# Patient Record
Sex: Male | Born: 1945
Health system: Southern US, Community
[De-identification: ages and names within clinical notes are randomized; demographics above are authoritative.]

## PROBLEM LIST (undated history)

## (undated) DIAGNOSIS — M1990 Unspecified osteoarthritis, unspecified site: Secondary | ICD-10-CM

## (undated) DIAGNOSIS — Z7739 Contact with and (suspected) exposure to other war theater: Secondary | ICD-10-CM

## (undated) DIAGNOSIS — J189 Pneumonia, unspecified organism: Secondary | ICD-10-CM

## (undated) DIAGNOSIS — G4733 Obstructive sleep apnea (adult) (pediatric): Secondary | ICD-10-CM

## (undated) DIAGNOSIS — J449 Chronic obstructive pulmonary disease, unspecified: Secondary | ICD-10-CM

## (undated) DIAGNOSIS — E785 Hyperlipidemia, unspecified: Secondary | ICD-10-CM

## (undated) DIAGNOSIS — I10 Essential (primary) hypertension: Secondary | ICD-10-CM

## (undated) DIAGNOSIS — Z9989 Dependence on other enabling machines and devices: Secondary | ICD-10-CM

## (undated) DIAGNOSIS — F4310 Post-traumatic stress disorder, unspecified: Secondary | ICD-10-CM

## (undated) DIAGNOSIS — Z77098 Contact with and (suspected) exposure to other hazardous, chiefly nonmedicinal, chemicals: Secondary | ICD-10-CM

## (undated) HISTORY — DX: Obstructive sleep apnea (adult) (pediatric): G47.33

## (undated) HISTORY — DX: Essential (primary) hypertension: I10

## (undated) HISTORY — DX: Hyperlipidemia, unspecified: E78.5

## (undated) HISTORY — DX: Dependence on other enabling machines and devices: Z99.89

## (undated) HISTORY — PX: OTHER SURGICAL HISTORY: SHX169

## (undated) HISTORY — PX: CARDIAC CATHETERIZATION: SHX172

## (undated) HISTORY — DX: Unspecified osteoarthritis, unspecified site: M19.90

## (undated) HISTORY — DX: Post-traumatic stress disorder, unspecified: F43.10

## (undated) HISTORY — PX: TONSILLECTOMY: SUR1361

---

## 1998-02-11 ENCOUNTER — Encounter: Payer: Self-pay | Admitting: Internal Medicine

## 1998-02-11 ENCOUNTER — Ambulatory Visit (HOSPITAL_COMMUNITY): Admission: RE | Admit: 1998-02-11 | Discharge: 1998-02-11 | Payer: Self-pay | Admitting: Internal Medicine

## 2000-07-08 ENCOUNTER — Emergency Department (HOSPITAL_COMMUNITY): Admission: EM | Admit: 2000-07-08 | Discharge: 2000-07-08 | Payer: Self-pay | Admitting: Emergency Medicine

## 2000-07-12 ENCOUNTER — Encounter: Payer: Self-pay | Admitting: Occupational Medicine

## 2000-07-12 ENCOUNTER — Encounter: Admission: RE | Admit: 2000-07-12 | Discharge: 2000-07-12 | Payer: Self-pay | Admitting: Occupational Medicine

## 2001-09-07 ENCOUNTER — Ambulatory Visit (HOSPITAL_COMMUNITY): Admission: RE | Admit: 2001-09-07 | Discharge: 2001-09-07 | Payer: Self-pay | Admitting: Internal Medicine

## 2001-09-07 ENCOUNTER — Encounter: Payer: Self-pay | Admitting: Internal Medicine

## 2002-07-17 ENCOUNTER — Encounter: Payer: Self-pay | Admitting: Emergency Medicine

## 2002-07-18 ENCOUNTER — Inpatient Hospital Stay (HOSPITAL_COMMUNITY): Admission: EM | Admit: 2002-07-18 | Discharge: 2002-07-19 | Payer: Self-pay | Admitting: Emergency Medicine

## 2003-07-03 ENCOUNTER — Ambulatory Visit (HOSPITAL_COMMUNITY): Admission: RE | Admit: 2003-07-03 | Discharge: 2003-07-03 | Payer: Self-pay | Admitting: Cardiology

## 2003-12-21 ENCOUNTER — Encounter: Admission: RE | Admit: 2003-12-21 | Discharge: 2003-12-21 | Payer: Self-pay | Admitting: Internal Medicine

## 2004-02-18 ENCOUNTER — Ambulatory Visit (HOSPITAL_COMMUNITY): Admission: RE | Admit: 2004-02-18 | Discharge: 2004-02-18 | Payer: Self-pay | Admitting: Internal Medicine

## 2004-02-26 ENCOUNTER — Encounter (INDEPENDENT_AMBULATORY_CARE_PROVIDER_SITE_OTHER): Payer: Self-pay | Admitting: *Deleted

## 2004-02-26 ENCOUNTER — Ambulatory Visit (HOSPITAL_COMMUNITY): Admission: RE | Admit: 2004-02-26 | Discharge: 2004-02-26 | Payer: Self-pay | Admitting: Internal Medicine

## 2004-02-26 ENCOUNTER — Encounter (INDEPENDENT_AMBULATORY_CARE_PROVIDER_SITE_OTHER): Payer: Self-pay | Admitting: Specialist

## 2004-05-16 ENCOUNTER — Ambulatory Visit (HOSPITAL_COMMUNITY): Admission: RE | Admit: 2004-05-16 | Discharge: 2004-05-16 | Payer: Self-pay | Admitting: *Deleted

## 2004-05-16 ENCOUNTER — Encounter (INDEPENDENT_AMBULATORY_CARE_PROVIDER_SITE_OTHER): Payer: Self-pay | Admitting: *Deleted

## 2004-07-22 ENCOUNTER — Encounter (HOSPITAL_COMMUNITY): Admission: RE | Admit: 2004-07-22 | Discharge: 2004-10-20 | Payer: Self-pay | Admitting: Internal Medicine

## 2005-04-10 ENCOUNTER — Encounter: Admission: RE | Admit: 2005-04-10 | Discharge: 2005-04-10 | Payer: Self-pay | Admitting: Internal Medicine

## 2006-04-09 ENCOUNTER — Ambulatory Visit (HOSPITAL_BASED_OUTPATIENT_CLINIC_OR_DEPARTMENT_OTHER): Admission: RE | Admit: 2006-04-09 | Discharge: 2006-04-09 | Payer: Self-pay | Admitting: Urology

## 2006-07-15 ENCOUNTER — Encounter: Admission: RE | Admit: 2006-07-15 | Discharge: 2006-07-15 | Payer: Self-pay | Admitting: Internal Medicine

## 2007-04-28 ENCOUNTER — Ambulatory Visit (HOSPITAL_COMMUNITY): Admission: RE | Admit: 2007-04-28 | Discharge: 2007-04-28 | Payer: Self-pay | Admitting: *Deleted

## 2007-04-28 ENCOUNTER — Encounter (INDEPENDENT_AMBULATORY_CARE_PROVIDER_SITE_OTHER): Payer: Self-pay | Admitting: *Deleted

## 2009-04-17 ENCOUNTER — Emergency Department (HOSPITAL_COMMUNITY): Admission: EM | Admit: 2009-04-17 | Discharge: 2009-04-17 | Payer: Self-pay | Admitting: Emergency Medicine

## 2009-04-24 ENCOUNTER — Emergency Department (HOSPITAL_COMMUNITY): Admission: EM | Admit: 2009-04-24 | Discharge: 2009-04-24 | Payer: Self-pay | Admitting: Emergency Medicine

## 2009-09-02 ENCOUNTER — Encounter (INDEPENDENT_AMBULATORY_CARE_PROVIDER_SITE_OTHER): Payer: Self-pay | Admitting: *Deleted

## 2009-09-13 ENCOUNTER — Ambulatory Visit: Payer: Self-pay | Admitting: Gastroenterology

## 2009-09-19 ENCOUNTER — Ambulatory Visit (HOSPITAL_COMMUNITY): Admission: RE | Admit: 2009-09-19 | Discharge: 2009-09-19 | Payer: Self-pay | Admitting: Endocrinology

## 2010-03-30 ENCOUNTER — Encounter: Payer: Self-pay | Admitting: Internal Medicine

## 2010-04-10 NOTE — Letter (Signed)
Summary: New Patient letter  [facility]  [address] [place], [place] [address]   Phone: [phone]  Fax: [phone]       09/02/2009 MRN: [id number]  Douglas Hansen [address] [place], [place]  [address]  Dear Douglas Hansen,  Welcome to the Gastroenterology Division at [facility].    You are scheduled to see Dr.  Christella Hartigan on 10-08-09 at 2:30p.m. on the 3rd floor at [facility], [address]  We ask that you try to arrive at our office 15 minutes prior to your appointment time to allow for check-in.  We would like you to complete the enclosed self-administered evaluation form prior to your visit and bring it with you on the day of your appointment.  We will review it with you.  Also, please bring a complete list of all your medications or, if you prefer, bring the medication bottles and we will list them.  Please bring your insurance card so that we may make a copy of it.  If your insurance requires a referral to see a specialist, please bring your referral form from your primary care physician.  Co-payments are due at the time of your visit and may be paid by cash, check or credit card.     Your office visit will consist of a consult with your physician (includes a physical exam), any laboratory testing he/she may order, scheduling of any necessary diagnostic testing (e.g. x-ray, ultrasound, CT-scan), and scheduling of a procedure (e.g. Endoscopy, Colonoscopy) if required.  Please allow enough time on your schedule to allow for any/all of these possibilities.    If you cannot keep your appointment, please call [phone] to cancel or reschedule prior to your appointment date.  This allows [place] the opportunity to schedule an appointment for another patient in need of care.  If you do not cancel or reschedule by 5 p.m. the business day prior to your appointment date, you will be charged a $50.00 late cancellation/no-show fee.    Thank you for choosing  [facility] for your medical needs.  We appreciate the opportunity to care for you.  Please visit [place] at our website  to learn more about our practice.                     Sincerely,                                                             The Gastroenterology Division

## 2010-04-10 NOTE — Assessment & Plan Note (Signed)
History of Present Illness Visit Type: Initial Visit Primary Provider: Pearson Grippe, MD Requesting Provider: Dr Dorisann Frames Chief Complaint: Patient c/o odynophagia History of Present Illness:     pleasant 65 year old man;  He has right sided neck feeling of pain.  This can happen when chewing, swallowing.  Sometimes the pain occurs on its own.  It is a burning pain.  Drinking water can help, he will rub the right neck and that can help. This has been going on for 3 months only.  He stays bloated alot.  He has been taking victoza and has noted bowel constipation on those days.  He was on antibiotics this past may/april for a sore throat.    he had EGD, dilation in 2009 and also in 2006 by Dr. Sabino Gasser.  These were for distal esophageal narrowing. He said he used to have a lot of dysphasia sensation but he has had no dysphasia since the second dilation. He is very clear that symptom he is having now is in his right neck and not at all like the dysphasia he was having before.  He is having some tests on his right neck, throat next week at [facility].  He had a colonoscopy by Dr. Greggory Stallion or in 2006 and no polyps were found.           Current Medications (verified): 1)  Lovaza 1 Gm Caps (Omega-3-Acid Ethyl Esters) .... Take 2 Capsules By Mouth Two Times A Day 2)  Azor 10-40 Mg Tabs (Amlodipine-Olmesartan) .... Take 1 Tablet By Mouth Once A Day 3)  Singulair 10 Mg Tabs (Montelukast Sodium) .... Take 1 Tablet By Mouth Once A Day 4)  Onglyza 5 Mg Tabs (Saxagliptin Hcl) .... Take 1 Tablet By Mouth Once A Day 5)  Fexofenadine Hcl 180 Mg Tabs (Fexofenadine Hcl) .... Take 1 Tablet By Mouth Two Times A Day 6)  Proair Hfa 108 (90 Base) Mcg/act Aers (Albuterol Sulfate) .... 8.5 Grams Four Times Daily 7)  Fluticasone (Unknown Dosage) .... 2 Sprays Each Nostril Once Daily 8)  Actoplus Met 15-500 Mg Tabs (Pioglitazone Hcl-Metformin Hcl) .... Take 1 Tablet By Mouth Two Times A Day 9)   Zoloft 100 Mg Tabs (Sertraline Hcl) .... Take 1 Tablet By Mouth Once A Day 10)  Aspirin 81 Mg Tbec (Aspirin) .... Take 1 Tablet By Mouth Once A Day 11)  Omeprazole 20 Mg Cpdr (Omeprazole) .... Take 1 Tablet By Mouth Once A Day 12)  Simvastatin 40 Mg Tabs (Simvastatin) .... Take 1 Tablet By Mouth Once A Day 13)  Vitamin C Cr 1000 Mg Cr-Tabs (Ascorbic Acid) .... Take 1 Tablet By Mouth Once A Day 14)  Vitamin D 1000 Unit Tabs (Cholecalciferol) .... Take 1 Tablet By Mouth Once A Day 15)  Vitamin B-12 250 Mcg Tabs (Cyanocobalamin) .... Take 1 Tablet By Mouth Once A Day 16)  Align  Caps (Probiotic Product) .... Take 1 Capsule By Mouth Once A Day 17)  Travatan Z 0.004 % Soln (Travoprost) .... Use As Needed-1 Drop Each Eye 18)  Clindamycin (Unknown Dosage) Cream .... Use As Directed On Skin 19)  Calipotriene (Unknown Dosage) .... Apply To Skin As Directed 20)  Clotrimazole 1 % Crea (Clotrimazole) .... Apply As Directed To Skin 21)  Ketoconazole (Unknown Dosage)cream .... Apply As Directed To Skin 22)  Dermacerin  Crea (Skin Protectants, Misc.) .... Apply As Directed To Skin 23)  Cellulose Carmellose Sodium  Powd (Cellulose Carmellose Sodium) .... Apply To Feet As  Directed 24)  Clobetasol Cream (Uknown Dosage) .... Use As Directed 25)  Triderm 0.1 % Crea (Triamcinolone Acetonide) .... Apply As Directed To Skin 26)  Derma-Smoothe/fs Body 0.01 % Oil (Fluocinolone Acetonide) .... Apply As Directed 27)  Benzyl Peroxide Liquid (Unknown Dosage) .... Apply As Directed On Feet  Allergies (verified): 1)  ! Codeine  Past History:  Past Medical History: Diabetes Hyperlipidemia Hypertension arthritis  Past Surgical History: none  Family History: no colon cancer  Social History: he is married, he has 5 children, he is retired, he quit smoking, he does not drink alcohol.  Review of Systems       Pertinent positive and negative review of systems were noted in the above HPI and GI specific review of  systems.  All other review of systems was otherwise negative.   Vital Signs:  Patient profile:   65 year old male Height:      67 inches Weight:      216.13 pounds BMI:     33.97 BSA:     2.09 Pulse rate:   96 / minute Pulse rhythm:   regular BP sitting:   130 / 80  (right arm)  Vitals Entered By: Lamona Curl CMA Duncan Dull) (September 13, 2009 2:28 PM)  Physical Exam  Additional Exam:  Constitutional: generally well appearing Psychiatric: alert and oriented times 3 Eyes: extraocular movements intact Mouth: oropharynx moist, no lesions:  no oral thrush, no obvious right oropharynx lesions Neck: supple, no lymphadenopathy Cardiovascular: heart regular rate and rythm Lungs: CTA bilaterally Abdomen: soft, non-tender, non-distended, no obvious ascites, no peritoneal signs, normal bowel sounds Extremities: no lower extremity edema bilaterally Skin: no lesions on visible extremities    Impression & Recommendations:  Problem # 1:  right sided neck discomfort unclear etiology. He seems this might be related to his parathyroid. He has been already set up for testing of his right neck and we will track down those results. This symptom does not seem esophageal related at all. He may need ear nose and throat evaluation if the next films are not revealing.  Problem # 2:  routine risk for colon cancer we will set him up for recall colonoscopy in 2016, 10 years from that date of his previous one.  Patient Instructions: 1)  We will get in touch with Dr. Willeen Cass office about your upcoming right neck testing. This pain does not seem GI related.  You may need ENT evaluation. 2)  Recall colonoscopy for colon cancer screening in March 2016. 3)  A copy of this information will be sent to Dr. Ricki Miller, [place]. 4)  The medication list was reviewed and reconciled.  All changed / newly prescribed medications were explained.  A complete medication list was provided to the patient / caregiver.

## 2010-04-10 NOTE — Procedures (Signed)
Summary: Colon   Colonoscopy  Procedure date:  05/16/2004  Findings:      Location:  [facility].   NAME:  Douglas Hansen, Douglas Hansen                ACCOUNT NO.:  [id number]   MEDICAL RECORD NO.:  [id number]          PATIENT TYPE:  AMB   LOCATION:  ENDO                         FACILITY:  [facility]   PHYSICIAN:  Georgiana Spinner, M.D.    DATE OF BIRTH:  1946/01/11   DATE OF PROCEDURE:  05/16/2004  DATE OF DISCHARGE:                                 OPERATIVE REPORT   PROCEDURE:  Colonoscopy.   INDICATIONS:  Colon cancer screening.   ANESTHESIA:  Demerol 70, Versed 7 mg.   PROCEDURE:  With the patient mildly sedated in the left lateral decubitus  position, a rectal examination was performed which was unremarkable.  Subsequently, the Olympus videoscopic colonoscope was inserted in the rectum  and passed under direct vision to the cecum, identified by ileocecal valve  and appendiceal orifice, both which were photographed.  Of note, there was  prep left, residual tenacious, yellowish material that was difficult to  suction.  But from this point, the colonoscope was then slowly withdrawn  taking circumferential views of the colonic mucosa, stopping to rinse and  suction as we went until we reached the rectum which appeared normal on  direct and showed hemorrhoids on retroflexed view.  The endoscope was  straightened and withdrawn.  The patient's vital signs, pulse oximeter  remained stable.  The patient tolerated procedure well without apparent  complication.   FINDINGS:  Internal hemorrhoids otherwise an unremarkable examination to the  cecum.   PLAN:  Consider repeat examination in 5 years.      GMO/MEDQ  D:  05/16/2004  T:  05/16/2004  Job:  [id number]

## 2010-04-10 NOTE — Procedures (Signed)
Summary: EGD   EGD  Procedure date:  04/28/2007  Findings:      Location: [facility]   NAME:  Douglas Hansen, Douglas Hansen                ACCOUNT NO.:  [id number]      MEDICAL RECORD NO.:  [id number]          PATIENT TYPE:  AMB      LOCATION:  ENDO                         FACILITY:  [facility]      PHYSICIAN:  Georgiana Spinner, M.D.    DATE OF BIRTH:  01/05/1946      DATE OF PROCEDURE:  04/28/2007   DATE OF DISCHARGE:                                  OPERATIVE REPORT      PROCEDURE:  Upper endoscopy with dilation.      ENDOSCOPIST:  Georgiana Spinner, M.D.      INDICATIONS:  Dysphagia.      ANESTHESIA:  Fentanyl 50 mcg, Versed 6 mg.      PROCEDURE:  With the patient mildly sedated in the left lateral   decubitus position, the Pentax videoscopic endoscope was inserted in the   mouth and passed under direct vision through the esophagus into the   stomach; fundus, body, antrum, duodenal bulb and second portion of   duodenum were visualized.  From this point, the endoscope was slowly   withdrawn, taking circumferential views of duodenal mucosa until the   endoscope had been pulled back into stomach and placed in retroflexion   to view the stomach from below.  The endoscope was straightened and a   guidewire was passed.  The endoscope was withdrawn.  Subsequently, a 17   Savary dilator was passed, but I met resistance and therefore I elected   to withdraw the dilator and the guidewire as well and then subsequently   the endoscope was then reinserted again, passed under direct vision   through the esophagus into the stomach.  The guidewire was once again   passed.  The endoscope was withdrawn.  This time, the Savary dilator was   passed over the guidewire with minimal resistance.  With the dilator, I   removed the guidewire; there was no blood seen.  The endoscope was   reinserted through the esophagus back into the stomach.  The endoscope   was then withdrawn, taking circumferential views of  the remaining   gastric and esophageal mucosa.  The patient's vital signs and pulse   oximetry remained stable.  The patient tolerated the procedure well   without apparent complications.      FINDINGS:  Unremarkable examination with the dilation to 17 Savary.      PLAN:  Await clinical response.  The patient will call me and follow up   with me as needed as an outpatient.                  ______________________________   Georgiana Spinner, M.D.            GMO/MEDQ  D:  04/28/2007  T:  04/29/2007  Job:  [id number]

## 2010-04-10 NOTE — Procedures (Signed)
Summary: EGD   EGD  Procedure date:  02/26/2004  Findings:      Location: [facility]   NAME:  Douglas Hansen, Douglas Hansen                ACCOUNT NO.:  [id number]   MEDICAL RECORD NO.:  [id number]          PATIENT TYPE:  AMB   LOCATION:  ENDO                         FACILITY:  [facility]   PHYSICIAN:  Georgiana Spinner, M.D.    DATE OF BIRTH:  1945/08/20   DATE OF PROCEDURE:  02/26/2004  DATE OF DISCHARGE:                                 OPERATIVE REPORT   PROCEDURE:  Upper endoscopy with Savary dilation and biopsy.   ENDOSCOPIST:  Georgiana Spinner, M.D.   INDICATIONS:  Dysphagia with known esophageal stricture on a barium swallow.   ANESTHESIA:  Demerol 60 mg, Versed 8 mg.   PROCEDURE:  With the patient mildly sedated in the left lateral decubitus  position in room 1 of endoscopy, the Olympus videoscopic endoscope was  inserted in the mouth and passed under direct vision through the esophagus,  which appeared normal.  The gastroesophageal junction appeared to be mildly  inflamed; this was photographed.  We then entered into the stomach; fundus,  body, antrum, duodenal bulb and second portion of duodenum were visualized.  From this point, the endoscope was slowly withdrawn, taking circumferential  views of the duodenal mucosa until the endoscope had been pulled back into  the stomach and placed in retroflexion to view the stomach from below.  The  endoscope was then straightened and a guidewire was passed under  fluoroscopic guidance.  Then endoscope was withdrawn, taking circumferential  views of the remaining gastric and esophageal mucosa as we withdrew,  photograph taken.  The endoscope was then withdrawn and over the guidewire  were passed Savary dilators 15 and 17 using fluoroscopic guidance.  With the  17, there was some resistance, therefore I elected to not dilate any  further, despite the fact there was no blood noted on either dilator.  The  endoscope was then subsequently  reinserted and passed into the stomach,  where we pulled back into the esophagus and biopsied the gastroesophageal  junction.  The endoscope was then withdrawn.  The patient's vital signs and  pulse oximetry remained stable.  The patient tolerated the procedure well  without apparent complications.   FINDINGS:  Mild inflammatory changes of the distal esophagus, biopsied and  dilated to 15 and 17 Savary dilation.   PLAN:  We will have the patient on a clear liquid diet for today and resume  regular diet in the morning, and have the patient follow up with me as an  outpatient.       GMO/MEDQ  D:  02/26/2004  T:  02/27/2004  Job:  [id number]

## 2010-04-25 ENCOUNTER — Ambulatory Visit: Payer: Medicare Other | Attending: Internal Medicine | Admitting: Physical Therapy

## 2010-04-25 DIAGNOSIS — R42 Dizziness and giddiness: Secondary | ICD-10-CM | POA: Insufficient documentation

## 2010-04-25 DIAGNOSIS — IMO0001 Reserved for inherently not codable concepts without codable children: Secondary | ICD-10-CM | POA: Insufficient documentation

## 2010-06-05 ENCOUNTER — Encounter: Payer: Self-pay | Admitting: Internal Medicine

## 2010-06-09 ENCOUNTER — Encounter: Payer: Self-pay | Admitting: Internal Medicine

## 2010-06-09 ENCOUNTER — Ambulatory Visit (INDEPENDENT_AMBULATORY_CARE_PROVIDER_SITE_OTHER)
Admission: RE | Admit: 2010-06-09 | Discharge: 2010-06-09 | Disposition: A | Discharge status: Home / Self Care | Payer: Medicare Other | Source: Ambulatory Visit | Attending: Internal Medicine | Admitting: Internal Medicine

## 2010-06-09 ENCOUNTER — Ambulatory Visit (INDEPENDENT_AMBULATORY_CARE_PROVIDER_SITE_OTHER): Payer: Medicare Other | Admitting: Internal Medicine

## 2010-06-09 DIAGNOSIS — R0602 Shortness of breath: Secondary | ICD-10-CM | POA: Insufficient documentation

## 2010-06-09 DIAGNOSIS — J45909 Unspecified asthma, uncomplicated: Secondary | ICD-10-CM

## 2010-06-09 DIAGNOSIS — E119 Type 2 diabetes mellitus without complications: Secondary | ICD-10-CM | POA: Insufficient documentation

## 2010-06-09 DIAGNOSIS — J449 Chronic obstructive pulmonary disease, unspecified: Secondary | ICD-10-CM | POA: Insufficient documentation

## 2010-06-09 DIAGNOSIS — I10 Essential (primary) hypertension: Secondary | ICD-10-CM

## 2010-06-09 DIAGNOSIS — E785 Hyperlipidemia, unspecified: Secondary | ICD-10-CM | POA: Insufficient documentation

## 2010-06-09 DIAGNOSIS — Z9989 Dependence on other enabling machines and devices: Secondary | ICD-10-CM

## 2010-06-09 DIAGNOSIS — R059 Cough, unspecified: Secondary | ICD-10-CM

## 2010-06-09 DIAGNOSIS — R05 Cough: Secondary | ICD-10-CM

## 2010-06-09 DIAGNOSIS — G4733 Obstructive sleep apnea (adult) (pediatric): Secondary | ICD-10-CM | POA: Insufficient documentation

## 2010-06-09 NOTE — Patient Instructions (Signed)
Work on inhaler technique:  relax and gently blow all the way out then take a nice smooth deep breath back in, triggering the inhaler at same time you start breathing in.  Hold for up to 5 seconds if you can.  Rinse and gargle with water when done   If your mouth or throat starts to bother you,   I suggest you time the inhaler to your dental care and after using the inhaler(s) brush teeth and tongue with a baking soda containing toothpaste and when you rinse this out, gargle with it first to see if this helps your mouth and throat.     GERD (REFLUX)  is an extremely common cause of respiratory symptoms, many times with no significant heartburn at all.    It can be treated with medication, but also with lifestyle changes including avoidance of late meals, excessive alcohol, smoking cessation, and avoid fatty foods, chocolate, peppermint, colas, red wine, and acidic juices such as orange juice.  NO MINT OR MENTHOL PRODUCTS SO NO COUGH DROPS  USE SUGARLESS CANDY INSTEAD (jolley ranchers or Stover's)  NO OIL BASED VITAMINS  (STOP FISH OIL FOR NOW)  Take omeprazole Take 30-60 min before first meal of the day

## 2010-06-09 NOTE — Assessment & Plan Note (Signed)
The differential diagnosis of difficult to control airways disorders is extensive with no quick and easy answers but easy to remember because it consists of 11 A's,  Two Bs and one C: 1. Adherence, always a challenge and the leading suspect.  The proper method of use, as well as anticipated side effects, of this metered-dose inhaler are discussed and demonstrated to the patient.  Improved from 50-75% with repeated coaching  2. Acid reflux disease, with the greater proportion of pulmonary patients with no overt heartburn symptoms, and no easy way to treat non-acid reflux so should avoid fish oil 3. Ace inhibitor use  >not relevant here  4. Active sinus dz, best addressed by a sinus ct 5. Active smoking,  Usually sureptitious in this setting 6. Allergic diseases > per Bardelas 7. Aspiration, a perennial problem in the elderly or other patients at risk 8. Allergic Bronchopulmonary Aspergillosis, associated with IgE's in the thousands 9. Alpha one Antitrypsin deficiency, a must screen in patients with chronic airflow obstruction syndromes out of proportion to smoking history. 10. Adverse effect of inhalers, especially DPI's and especially with poor inhaler technique 11 Anxiety, always a diagnosis of exclusion Two B's 1. Bronchiectasis:  Pos CT is the sine que non here 2  Beta blocker effects:  Coreg and Timolol use are pervasive in the adult population and both have significant spillover effects on the airways One C 1. Congestive heart failure, nicely ruled out now with BNP level of < 100 when symptomatic

## 2010-06-09 NOTE — Progress Notes (Signed)
  Subjective:    Patient ID: Douglas Hansen, male    DOB: 05-Feb-1946, 65 y.o.   MRN: [id number]  HPI 56 yobm  Quit smoking in  1987 with wt 165 with doe with running  then gradually gained wt and worsening doe so referred to Clinic by Dr Beaulah Dinning some  Better on symbicort  06/09/2010 ov Initial pulmonary office eval for progressive doe to point where sob > aisle at [employer] or climbing a flight of steps with laundry.   Rides bike x 15 minutes then legs get tired. Also cough, mostly dry.  Pt denies any significant sore throat, dysphagia, itching, sneezing,  nasal congestion or excess/ purulent secretions,  fever, chills, sweats, unintended wt loss, pleuritic or exertional cp, hempoptysis, orthopnea pnd or leg swelling.    Also denies any obvious fluctuation of symptoms with weather or environmental changes or other aggravating or alleviating factors.     PMHx: COPD/AB     - PFT's 06/03/10   FEV1  1.60  FVC 3liters so ratio 54%     - HFA 50% p coaching 06/09/2010    Review of Systems  Constitutional: Negative for fever, chills, activity change, appetite change and unexpected weight change.  HENT: Positive for congestion and sneezing. Negative for sore throat, rhinorrhea, trouble swallowing, dental problem, voice change and postnasal drip.   Eyes: Negative for visual disturbance.  Respiratory: Positive for cough and shortness of breath. Negative for choking.   Cardiovascular: Negative for chest pain and leg swelling.  Gastrointestinal: Positive for abdominal pain. Negative for nausea and vomiting.  Genitourinary: Negative for difficulty urinating.  Musculoskeletal: Negative for arthralgias.  Skin: Negative for rash.  Neurological: Positive for headaches.  Psychiatric/Behavioral: Negative for behavioral problems and confusion.       Objective:   Physical Exam    pleasant mod obeste amb bm nad HEENT mild turbinate edema.  Oropharynx no thrush or excess pnd or cobblestoning.  No JVD or  cervical adenopathy. Mild accessory muscle hypertrophy. Trachea midline, nl thryroid. Chest was hyperinflated by percussion with diminished breath sounds and moderate increased exp time without wheeze. Hoover sign positive at mid inspiration. Regular rate and rhythm without murmur gallop or rub or increase P2 or edema.  Abd: no hsm, nl excursion. Ext warm without cyanosis or clubbing.      Assessment & Plan:

## 2010-06-11 NOTE — Progress Notes (Signed)
Quick Note:  Spoke with pt and notified of results per MW. Pt verbalized understanding. ______

## 2010-07-22 NOTE — Op Note (Signed)
NAMEDARCY, CORDNER NO.:  [id number]   MEDICAL RECORD NO.:  [id number]          PATIENT TYPE:  AMB   LOCATION:  ENDO                         FACILITY:  [facility]   PHYSICIAN:  Georgiana Spinner, M.D.    DATE OF BIRTH:  06-Oct-1945   DATE OF PROCEDURE:  04/28/2007  DATE OF DISCHARGE:                               OPERATIVE REPORT   PROCEDURE:  Upper endoscopy with dilation.   ENDOSCOPIST:  Georgiana Spinner, M.D.   INDICATIONS:  Dysphagia.   ANESTHESIA:  Fentanyl 50 mcg, Versed 6 mg.   PROCEDURE:  With the patient mildly sedated in the left lateral  decubitus position, the Pentax videoscopic endoscope was inserted in the  mouth and passed under direct vision through the esophagus into the  stomach; fundus, body, antrum, duodenal bulb and second portion of  duodenum were visualized.  From this point, the endoscope was slowly  withdrawn, taking circumferential views of duodenal mucosa until the  endoscope had been pulled back into stomach and placed in retroflexion  to view the stomach from below.  The endoscope was straightened and a  guidewire was passed.  The endoscope was withdrawn.  Subsequently, a 17  Savary dilator was passed, but I met resistance and therefore I elected  to withdraw the dilator and the guidewire as well and then subsequently  the endoscope was then reinserted again, passed under direct vision  through the esophagus into the stomach.  The guidewire was once again  passed.  The endoscope was withdrawn.  This time, the Savary dilator was  passed over the guidewire with minimal resistance.  With the dilator, I  removed the guidewire; there was no blood seen.  The endoscope was  reinserted through the esophagus back into the stomach.  The endoscope  was then withdrawn, taking circumferential views of the remaining  gastric and esophageal mucosa.  The patient's vital signs and pulse  oximetry remained stable.  The patient tolerated the procedure well  without apparent complications.   FINDINGS:  Unremarkable examination with the dilation to 17 Savary.   PLAN:  Await clinical response.  The patient will call me and follow up  with me as needed as an outpatient.           ______________________________  Georgiana Spinner, M.D.     GMO/MEDQ  D:  04/28/2007  T:  04/29/2007  Job:  [id number]

## 2010-07-25 NOTE — Op Note (Signed)
NAMEORRY, SIGL NO.:  [id number]   MEDICAL RECORD NO.:  [id number]          PATIENT TYPE:  AMB   LOCATION:  ENDO                         FACILITY:  [facility]   PHYSICIAN:  Georgiana Spinner, M.D.    DATE OF BIRTH:  1945-05-14   DATE OF PROCEDURE:  02/26/2004  DATE OF DISCHARGE:                                 OPERATIVE REPORT   PROCEDURE:  Upper endoscopy with Savary dilation and biopsy.   ENDOSCOPIST:  Georgiana Spinner, M.D.   INDICATIONS:  Dysphagia with known esophageal stricture on a barium swallow.   ANESTHESIA:  Demerol 60 mg, Versed 8 mg.   PROCEDURE:  With the patient mildly sedated in the left lateral decubitus  position in room 1 of endoscopy, the Olympus videoscopic endoscope was  inserted in the mouth and passed under direct vision through the esophagus,  which appeared normal.  The gastroesophageal junction appeared to be mildly  inflamed; this was photographed.  We then entered into the stomach; fundus,  body, antrum, duodenal bulb and second portion of duodenum were visualized.  From this point, the endoscope was slowly withdrawn, taking circumferential  views of the duodenal mucosa until the endoscope had been pulled back into  the stomach and placed in retroflexion to view the stomach from below.  The  endoscope was then straightened and a guidewire was passed under  fluoroscopic guidance.  Then endoscope was withdrawn, taking circumferential  views of the remaining gastric and esophageal mucosa as we withdrew,  photograph taken.  The endoscope was then withdrawn and over the guidewire  were passed Savary dilators 15 and 17 using fluoroscopic guidance.  With the  17, there was some resistance, therefore I elected to not dilate any  further, despite the fact there was no blood noted on either dilator.  The  endoscope was then subsequently reinserted and passed into the stomach,  where we pulled back into the esophagus and biopsied the  gastroesophageal  junction.  The endoscope was then withdrawn.  The patient's vital signs and  pulse oximetry remained stable.  The patient tolerated the procedure well  without apparent complications.   FINDINGS:  Mild inflammatory changes of the distal esophagus, biopsied and  dilated to 15 and 17 Savary dilation.   PLAN:  We will have the patient on a clear liquid diet for today and resume  regular diet in the morning, and have the patient follow up with me as an  outpatient.       GMO/MEDQ  D:  02/26/2004  T:  02/27/2004  Job:  [id number]

## 2010-07-25 NOTE — H&P (Signed)
**Note Douglas Hansen** [name], [name]                            ACCOUNT NO.:  [id number]   MEDICAL RECORD NO.:  [id number]                   PATIENT TYPE:  INP   LOCATION:  0368                                 FACILITY:  [facility]   PHYSICIAN:  Aram Candela. Tysinger, M.D.              DATE OF BIRTH:  01-26-1946   DATE OF ADMISSION:  07/17/2002  DATE OF DISCHARGE:                                HISTORY & PHYSICAL   CHIEF COMPLAINT:  Chest pain.   HISTORY OF PRESENT ILLNESS:  This is a 65 year old African American male who  is married and a patient of Marcy Salvo C. Lendell Caprice, M.D.  He had chest pain  from 9:30 to 10 a.m. yesterday morning.  It was in his left upper chest and  went away in about 20 seconds.  It was a mild twinge.  Last night somewhere  between 8:20 and 8:30 p.m., he had more chest pain.  He says that he had  snacked on pork rinds before supper, did some jumping jacks, and then ate  supper.  Ten minutes after supper, he had some severe pain not related to  movement.  It would be severe for one to two minutes and ease off to about a  3 or 4 on a scale of 10 and the severe pain would come and go.  He came to  the [facility] by private vehicle.  In the emergency  department, the chest pain was relieved by nitroglycerin.  Some of his  cardiac enzymes were abnormal, so the patient was admitted to the hospital.   PAST MEDICAL HISTORY:  The patient's past medical history is significant  for:  1. Asthma.  2. Hypertension.  3. Hypercholesterolemia.  4. Allergic rhinitis.  5. History of perirectal abscesses.  6. History of having smoked.  7. History of hypercholesterolemia, but with a negative parathyroid scan.  8. History of atypical chest pain which was ruled out for coronary artery     disease at [facility], in [place].   PAST SURGICAL HISTORY:  The patient's past surgical history is significant  for tonsillectomy.  He has also had a parathyroid scan done, which  revealed  no problems.   DRUG ALLERGIES:  The patient is allergic to CODEINE.   MEDICATIONS:  1. Aspirin 81 mg one a day.  2. Multivitamins one a day.  3. Tiazac 240 mg one every day.  4. Tylenol as needed.  5. Zocor 40 mg one every day.  6. Zestril 20 mg one every day.  7. Albuterol metered dose inhaler for wheezing as needed.  8. Nasonex one spray to each nostril twice a day.  9. Milk of magnesia or Citrucel as needed.   FAMILY HISTORY:  The patient's father died at 61 from a myocardial  infarction.  His mother died at 8 from a myocardial infarction.  He has one  brother who  died at 45 and one brother who died at 5 from myocardial  infarction.  He has one brother with coronary artery disease who recently  had angioplasty.   SOCIAL HISTORY:  The patient is married.  He is a Arboriculturist.  He does not  smoke.  He does drink alcohol.  He uses decaffeinated coffee.  He does not  abuse drugs.  He is right-handed.   REVIEW OF SYSTEMS:  CONSTITUTIONAL:  The patient denies fever, chills,  sweating, or edema.  His weight is about the same as usual and he sleeps  well.  EYES:  The patient denies diplopia, blurring, contacts, or cataracts.  He  does wear glasses.  He thinks he might have glaucoma.  EARS, NOSE, MOUTH,  AND THROAT:  The patient denies deafness, tinnitus, dysgeusia, sores in his  mouth, a sore tongue, or dentures.  He does have some rhinorrhea and  occasionally sneezes.  CARDIOVASCULAR:  Chest pain as noted.  The patient is  uncertain whether he has palpitations or not.  He does have some dyspnea on  exertion and a history of asthma.  He denies paroxysmal nocturnal dyspnea,  orthopnea, and claudication.  RESPIRATORY:  The patient denies coughing,  smoking, or sleep apnea.  He produces some yellow sputum at times.  He  wheezes sometimes when he breathes.  He does snore.  GASTROINTESTINAL:  The  patient says that he frequently has abdominal distention.  He does have some   dysphagia.  He denies nausea, vomiting, and diarrhea.  He does have  constipation and occasionally has indigestion.  GENITOURINARY:  The patient  denies dysuria, pyuria, hematuria, anuria, hesitation, and incontinence.  He  does have urinary frequency.  He has nocturia four to five times at night.  He is not circumcised.  MUSCULOSKELETAL:  The patient says that he has a  painful right knee and that he has frequent leg cramps.  He does have some  fatigue.  His gait is steady and he has had no recent falls.  SKIN:  The  patient denies rashes, but he says that he has an irritation on his skin due  to Agent Orange exposure.  BREASTS:  The patient denies masses, lumps, or  discharge of the breasts, but he does occasionally have tenderness across  his upper chest.  NEUROLOGIC:  The patient denies faintness, syncope,  numbness, seizures, and signs and symptoms of a stroke.  He is rarely dizzy  and does have some headaches.  PSYCHIATRIC:  The patient denies depression,  tension, stress, anorexia, or hallucinations.  ENDOCRINE:  The patient  denies thyroid disease, diabetes mellitus, excessive thirst, excessive  hunger, or excessive urine volume output.  HEMATOLOGIC:  The patient both  bruises and bleeds easily.  LYMPHATIC:  The patient denies adenopathy of the  neck, axillae, and groin.  OTHER SYSTEMS:  All other systems are negative.   ALLERGIES:  The patient is allergic to CODEINE.  He has a questionable food  allergy to CHICKEN, which makes him itch.  He does have allergic rhinitis.   PHYSICAL EXAMINATION:  VITAL SIGNS:  The patient's temperature is 98.2  degrees Fahrenheit taken orally.  His pulse is 68, respirations 18, and  blood pressure 127/65.  His pulse oximetry is 97% on room air.  His I&O is  positive 447 mL.  GENERAL APPEARANCE:  The patient is a well-developed, well-nourished, black  male in no acute distress. PSYCHIATRIC:  The patient is pleasant, cooperative, and responds   appropriately.  HEENT:  The patient is normocephalic and atraumatic.  His pupils are equal,  round, and reactive to light and accommodate.  His pupils are 2 mm in  diameter.  Extraocular movements are intact.  His mouth is moist.  His  oropharynx is benign.  NECK:  The patient's neck is supple with a midline trachea.  He is without  jugular venous distention, bruit, thyromegaly, or hepatojugular reflux.  CHEST:  The patient is eupneic.  His chest is clear to auscultation and  percussion.  BREASTS:  The patient's breasts are of normal contour without discharge or  tenderness.  ADENOPATHY:  The patient is without cervical adenopathy.  CARDIAC:  The patient has a regular rate and rhythm.  S1 and S2 are clearly  hear.  No murmurs, rubs, gallops, or clicks are auscultated.  ABDOMEN:  The patient has positive bowel sounds.  His abdomen is soft.  He  is nontender in his abdomen, but his mildly distended.  There is increased  percussion throughout all four quadrants.  The patient is obese.  GENITOURINARY:  The patient is uncircumcised.  He is not tender over his  bladder.  EXTREMITIES:  The patient moves all extremities x 4.  His strength is 5/5 in  his upper and lower extremities.  He has very mild bilateral ankle edema.  SKIN:  The patient's skin is warm and dry without jaundice, cyanosis,  pallor, or rashes.  He has a brisk capillary refill.  NEUROLOGIC:  The patient is conscious, alert, and oriented to person, place,  time, and situation.  Cranial nerves II-XII are grossly intact.   LABORATORY DATA AND X-RAYS:  The patient's chest x-ray shows that his heart  is borderline enlarged with pulmonary vasculature being within normal  limits.  His lungs are clear with no pneumothoraces or effusions being seen.  He has borderline cardiomegaly without decompensation.  On Jul 17, 2002, his  white blood cell count was 4.7, his hemoglobin was 17.2, his hematocrit was  50.4, his platelets were 112, and  his eosinophils were 7%.  His sodium was  137, his potassium was 3.7, his chloride was 108, his CO2 was 23, his BUN  was 17, his creatinine was 0.9, and his blood glucose was 120.  His PT was  12.3 and his INR was 0.9 at 10:10 p.m.  His CK was high at 467, his MB was  high was 6.7, his index was 1.4, and his troponin I was 0.02.  On Jul 18, 2002, at 4:40 a.m., his troponin I was 0.02.  At 10:02 a.m., his CK was 326,  his MB was high at 4.6, his index was 1.4, and his troponin I was 0.03.   IMPRESSION:  1. Chest pain.  2. Abnormal cardiac enzymes.  3. Hypercholesterolemia.  4. Hypertension.  5. Dyspnea on exertion.  6. Some fatigue.  7. History of smoking.  8. Obesity.  9. Positive family history for  heart disease.   PLAN:  1. Admit to telemetry.  2. Admission labs, chest x-ray, and EKG.  3. Serial enzymes.  4. O2 by nasal cannula.  5. Nitroglycerin drip. 6. Cardiac catheterization on Jul 19, 2002, or Jul 20, 2002.     Arletha Pili. Dahlia Client. Aleen Campi, M.D.    LMK/MEDQ  D:  07/18/2002  T:  07/19/2002  Job:  [id number]

## 2010-07-25 NOTE — Cardiovascular Report (Signed)
NAMEMICHALL, NOFFKE                            ACCOUNT NO.:  [id number]   MEDICAL RECORD NO.:  [id number]                   PATIENT TYPE:  OUT   LOCATION:  CATH                                 FACILITY:  [facility]   PHYSICIAN:  John R. Tysinger, M.D.              DATE OF BIRTH:  [birth year]   DATE OF PROCEDURE:  07/19/2002  DATE OF DISCHARGE:  07/19/2002                              CARDIAC CATHETERIZATION   PROCEDURES PERFORMED:  1. Left heart catheterization.  2. Coronary cine angiography.  3. Left ventricular cine angiography.  4. Abdominal aortogram.  5. Perclose to the right femoral artery.   CARDIOLOGIST:  Aram Candela. Aleen Campi, M.D.   INDICATIONS FOR PROCEDURE:  This 65 year old male was admitted to [facility] through the emergency room where he presented with anterior  chest pain.  His cardiac enzymes were abnormal with an increased total CK  and CK MB, but with a normal troponin I.  His electrocardiogram remained  normal.  He has a long history of hypertension and hypercholesterolemia, and  a positive family history for ischemic heart disease.  With the positive  cardiac enzymes he was scheduled for cardiac cath.   DESCRIPTION OF PROCEDURE:  After signing an informed consent the patient was  premedicated with 50 mg of Benadryl intravenously and brought to the cardiac  catheterization lab.  His right groin was prepped and draped in a sterile  fashion and anesthetized locally with 1% lidocaine.  A 6 French introducer  sheath was inserted percutaneously into the right femoral artery.  Six  [place] #4 Judkins coronary catheters were used to make injections into the  native coronary arteries.  A 6 French pigtail catheter was used to measure  pressures in the left ventricle and aorta, and to make midstream injections  into the left ventricle and abdominal aorta.   The patient tolerated the procedure well and no complications were noted.  At the end of the procedure the  catheters and sheath were removed from the  right femoral artery and hemostasis was easily obtained with the Perclose  Closure System.   MEDICATIONS GIVEN:  None.   HEMODYNAMIC DATA:  Left ventricular pressure 119/12-19.  Aortic pressure  110/63 with a mean of 83. Left ventricular ejection fraction was estimated  at 70.   CINE FINDINGS:   CORONARY CINE ANGIOGRAPHY:  Left Coronary Artery:  The ostium and left main  appear normal.   Left Anterior Descending:  The LAD appears normal without significant plaque  and with good antegrade flow.  There is a large intermediate branch, which  appears normal.   Circumflex Coronary Artery:  The circumflex coronary artery appears normal.   Right Coronary Artery:  The right coronary artery appears normal.   LEFT VENTRICULAR CINE ANGIOGRAM:  The left ventricular chamber size and  contractility appear normal with an ejection fraction of 70%.  The left  ventricular wall  thickness is mildly prominent.  The mitral and aortic  valves appear normal.   ABDOMINAL AORTOGRAM:  The abdominal aorta, renal arteries and iliac arteries  appear normal.   FINAL DIAGNOSES:  1. Normal coronary arteries.  2. Normal left ventricular function.  3. Normal mitral and aortic valves.  4. Normal abdominal aorta and renal arteries.  5. Successful Perclose of the right femoral artery.   DISPOSITION:  Rather than returning to [facility] we will monitor him on  the short stay unit prior to discharge when stable.  We will arrange for a  follow up visit in two weeks in the office.                                                 John R. Tysinger, M.D.    JRT/MEDQ  D:  07/19/2002  T:  07/20/2002  Job:  [id number]   cc:   [facility]   [facility] Medical Records

## 2010-07-25 NOTE — Op Note (Signed)
Douglas Hansen, Douglas Hansen                ACCOUNT NO.:  [id number]   MEDICAL RECORD NO.:  [id number]          PATIENT TYPE:  AMB   LOCATION:  NESC                         FACILITY:  [facility]   PHYSICIAN:  Bertram Millard. Dahlstedt, M.D.DATE OF BIRTH:  02-09-1946   DATE OF PROCEDURE:  04/09/2006  DATE OF DISCHARGE:                               OPERATIVE REPORT   PREOPERATIVE DIAGNOSIS:  Phimosis.   POSTOPERATIVE DIAGNOSIS:  Phimosis.   PROCEDURE:  Circumcision.   ANESTHESIA:  Local with MAC.   COMPLICATIONS:  None.   SPECIMEN:  Foreskin, not sent to pathology.   BRIEF HISTORY:  65 year old male presented for circumcision.  He has had  difficulty with his foreskin for a few years.  He also has some tearing  of his foreskin during intercourse precluding intercourse shortly  thereafter.  He presented my office earlier in the year and desires  circumcision.   Risks and complications of procedure have been discussed with the  patient.  He understands these and desires to proceed.   DESCRIPTION OF PROCEDURE:  The patient was administered preoperative IV  antibiotics.  Taken to the operating room where monitored anesthesia  care was established.  20 mL of quarter percent Marcaine was placed in  the infrapubic area establishing a dorsal nerve block.  Two circumcising  incisions were then made on the foreskin, one proximal and one distal.  The foreskin was excised.  Small bleeders were electrocoagulated.  Quadrant sutures of 4-0 chromic were then placed in simple interrupted  fashion with the exception of the ventral most area where a U stitch  was placed in the frenulum.  Between these the same 4-0 chromic was used  to run the skin closed in a simple running fashion.  The usual dressing  of Vaseline gauze, Kling and Coban was placed.   The patient tolerated procedure well.  He was awakened, taken to PACU in  stable condition.   Sponge, needle and instrument counts were correct x2.      Bertram Millard. Dahlstedt, M.D.  Electronically Signed     SMD/MEDQ  D:  04/09/2006  T:  04/09/2006  Job:  [id number]   cc:   Janae Bridgeman. Eloise Harman., M.D.  Fax: [phone]

## 2010-07-25 NOTE — Op Note (Signed)
NAMEMAXTON, Douglas NO.:  [id number]   MEDICAL RECORD NO.:  [id number]          PATIENT TYPE:  AMB   LOCATION:  ENDO                         FACILITY:  [facility]   PHYSICIAN:  Georgiana Spinner, M.D.    DATE OF BIRTH:  1945-06-11   DATE OF PROCEDURE:  05/16/2004  DATE OF DISCHARGE:                                 OPERATIVE REPORT   PROCEDURE:  Colonoscopy.   INDICATIONS:  Colon cancer screening.   ANESTHESIA:  Demerol 70, Versed 7 mg.   PROCEDURE:  With the patient mildly sedated in the left lateral decubitus  position, a rectal examination was performed which was unremarkable.  Subsequently, the Olympus videoscopic colonoscope was inserted in the rectum  and passed under direct vision to the cecum, identified by ileocecal valve  and appendiceal orifice, both which were photographed.  Of note, there was  prep left, residual tenacious, yellowish material that was difficult to  suction.  But from this point, the colonoscope was then slowly withdrawn  taking circumferential views of the colonic mucosa, stopping to rinse and  suction as we went until we reached the rectum which appeared normal on  direct and showed hemorrhoids on retroflexed view.  The endoscope was  straightened and withdrawn.  The patient's vital signs, pulse oximeter  remained stable.  The patient tolerated procedure well without apparent  complication.   FINDINGS:  Internal hemorrhoids otherwise an unremarkable examination to the  cecum.   PLAN:  Consider repeat examination in 5 years.      GMO/MEDQ  D:  05/16/2004  T:  05/16/2004  Job:  [id number]

## 2010-07-29 ENCOUNTER — Ambulatory Visit (INDEPENDENT_AMBULATORY_CARE_PROVIDER_SITE_OTHER): Payer: Medicare Other | Admitting: Internal Medicine

## 2010-07-29 ENCOUNTER — Encounter: Payer: Self-pay | Admitting: Internal Medicine

## 2010-07-29 DIAGNOSIS — R059 Cough, unspecified: Secondary | ICD-10-CM

## 2010-07-29 DIAGNOSIS — R05 Cough: Secondary | ICD-10-CM

## 2010-07-29 DIAGNOSIS — R0602 Shortness of breath: Secondary | ICD-10-CM

## 2010-07-29 DIAGNOSIS — J45909 Unspecified asthma, uncomplicated: Secondary | ICD-10-CM

## 2010-07-29 LAB — PULMONARY FUNCTION TEST

## 2010-07-29 NOTE — Patient Instructions (Signed)
Work on Musician technique:  relax and gently blow all the way out then take a nice smooth deep breath back in, triggering the inhaler at same time you start breathing in.  Hold for up to 5 seconds if you can.  Rinse and gargle with water when done   If your mouth or throat starts to bother you,   I suggest you time the inhaler to your dental care and after using the inhaler(s) brush teeth and tongue with a baking soda containing toothpaste and when you rinse this out, gargle with it first to see if this helps your mouth and throat.     Weight control is simply a matter of calorie balance which needs to be tilted in your favor by eating less and exercising more.  To get the most out of exercise, you need to be continuously aware that you are short of breath, but never out of breath, for 30 minutes daily. As you improve, it will actually be easier for you to do the same amount of exercise  in  30 minutes so always push to the level where you are short of breath.  If this does not result in gradual weight reduction then I strongly recommend you see a nutritionist with a food diary x 2 weeks so that we can work out a negative calorie balance which is universally effective in steady weight loss programs.  Think of your calorie balance like you do your bank account where in this case you want the balance to go down so you must take in less calories than you burn up.  It's just that simple:  Hard to do, but easy to understand.  Good luck!   If you find are loosing ground with your exercise tolerance please return  You do not have significant permanent lung damage from smoking

## 2010-07-29 NOTE — Assessment & Plan Note (Signed)
The main residual issue is doe which is mostly related to obesity and deconditioning  I had an extended discussion with the patient today lasting 15 to 20 minutes of a 25 minute visit on the following issues:   Calorie balance issues addressed may benefit from food diary feedback from nutritionist  See instructions for specific recommendations which were reviewed directly with the patient who was given a copy with highlighter outlining the key components.

## 2010-07-29 NOTE — Progress Notes (Signed)
PFT done today. 

## 2010-07-29 NOTE — Progress Notes (Signed)
  Subjective:    Patient ID: Douglas Hansen, male    DOB: 01-06-1946, 65 y.o.   MRN: [id number]  HPI  1 yobm primary care through [place]  Quit smoking in  1987 with wt 165 with doe with running  then gradually gained wt and worsening doe so referred to Clinic by Dr Beaulah Dinning some  Better on symbicort  06/09/2010 ov Initial pulmonary office eval for progressive doe to point where sob > aisle at [employer] or climbing a flight of steps with laundry.   Rides bike x 15 minutes then legs get tired.  rec Work on inhaler technique:  relax and gently blow all the way out then take a nice smooth deep breath back in, triggering the inhaler at same time you start breathing in.  Hold for up to 5 seconds if you can.  Rinse and gargle with water when done   If your mouth or throat starts to bother you,   I suggest you time the inhaler to your dental care and after using the inhaler(s) brush teeth and tongue with a baking soda containing toothpaste and when you rinse this out, gargle with it first to see if this helps your mouth and throat.     GERD (REFLUX) diet   Take omeprazole Take 30-60 min before first meal of the day   07/29/2010 ov/Dossie Swor ov  Minimal sob,  No sign cough.  Sleeping ok without nocturnal  or early am exac of resp c/o's or need for noct saba. Pt denies any significant sore throat, dysphagia, itching, sneezing,  nasal congestion or excess/ purulent secretions,  fever, chills, sweats, unintended wt loss, pleuritic or exertional cp, hempoptysis, orthopnea pnd or leg swelling.    Also denies any obvious fluctuation of symptoms with weather or environmental changes or other aggravating or alleviating factors.      PMHx: COPD/AB     - PFT's 06/03/10   FEV1  1.60  FVC 3liters so ratio 54     - PFT's 07/29/2010           1.9 (73%)  FVC 2.62 so ratio 76 and nl dlco     - HFA 50% p coaching 06/09/2010 > 75% after training 07/29/2010        Objective:   Physical Exam    pleasant mod obese amb bm nad  wt 219 07/29/2010  HEENT mild turbinate edema.  Oropharynx no thrush or excess pnd or cobblestoning.  No JVD or cervical adenopathy. Mild accessory muscle hypertrophy. Trachea midline, nl thryroid. Chest was hyperinflated by percussion with diminished breath sounds and moderate increased exp time without wheeze. Hoover sign positive at mid inspiration. Regular rate and rhythm without murmur gallop or rub or increase P2 or edema.  Abd: no hsm, nl excursion. Ext warm without cyanosis or clubbing.    cxr 06/09/10 Comparison: Chest x-ray of 12/15/2009  Findings: No active infiltrate or effusion is seen. Peribronchial  thickening is noted which may indicate bronchitis, possibly  chronic. The heart is within upper limits of normal. No acute  bony abnormality is seen.  IMPRESSION:  No active lung disease. Question chronic bronchitis   Assessment & Plan:

## 2010-07-29 NOTE — Assessment & Plan Note (Signed)
All goals of chronic asthma control met including optimal function and elimination of symptoms with minimal need for rescue therapy.  Contingencies discussed in full including contacting this office immediately if not controlling the symptoms using the rule of two's.     The proper method of use, as well as anticipated side effects, of this metered-dose inhaler are discussed and demonstrated to the patient. Improved to 75% with coaching

## 2010-08-08 ENCOUNTER — Encounter: Payer: Self-pay | Admitting: Internal Medicine

## 2010-08-08 DIAGNOSIS — J4541 Moderate persistent asthma with (acute) exacerbation: Secondary | ICD-10-CM

## 2010-12-28 ENCOUNTER — Inpatient Hospital Stay (INDEPENDENT_AMBULATORY_CARE_PROVIDER_SITE_OTHER)
Admission: RE | Admit: 2010-12-28 | Discharge: 2010-12-28 | Disposition: A | Discharge status: Home / Self Care | Payer: Medicare Other | Source: Ambulatory Visit | Attending: Family Medicine | Admitting: Family Medicine

## 2010-12-28 DIAGNOSIS — J45909 Unspecified asthma, uncomplicated: Secondary | ICD-10-CM

## 2011-01-30 ENCOUNTER — Emergency Department (INDEPENDENT_AMBULATORY_CARE_PROVIDER_SITE_OTHER)
Admission: EM | Admit: 2011-01-30 | Discharge: 2011-01-30 | Disposition: A | Discharge status: Nursing Home (Includes ICF) | Payer: Medicare Other | Source: Home / Self Care | Attending: Family Medicine | Admitting: Family Medicine

## 2011-01-30 ENCOUNTER — Encounter (HOSPITAL_COMMUNITY): Payer: Self-pay | Admitting: *Deleted

## 2011-01-30 ENCOUNTER — Emergency Department (INDEPENDENT_AMBULATORY_CARE_PROVIDER_SITE_OTHER): Payer: Medicare Other

## 2011-01-30 ENCOUNTER — Encounter (HOSPITAL_COMMUNITY): Payer: Self-pay | Admitting: Cardiology

## 2011-01-30 ENCOUNTER — Inpatient Hospital Stay (HOSPITAL_COMMUNITY)
Admission: EM | Admit: 2011-01-30 | Discharge: 2011-02-02 | DRG: 178 | Disposition: A | Discharge status: Home / Self Care | Payer: Medicare Other | Attending: Internal Medicine | Admitting: Internal Medicine

## 2011-01-30 DIAGNOSIS — J156 Pneumonia due to other aerobic Gram-negative bacteria: Principal | ICD-10-CM | POA: Diagnosis present

## 2011-01-30 DIAGNOSIS — G4733 Obstructive sleep apnea (adult) (pediatric): Secondary | ICD-10-CM | POA: Diagnosis present

## 2011-01-30 DIAGNOSIS — R0902 Hypoxemia: Secondary | ICD-10-CM | POA: Diagnosis present

## 2011-01-30 DIAGNOSIS — Z87891 Personal history of nicotine dependence: Secondary | ICD-10-CM

## 2011-01-30 DIAGNOSIS — J189 Pneumonia, unspecified organism: Secondary | ICD-10-CM

## 2011-01-30 DIAGNOSIS — E119 Type 2 diabetes mellitus without complications: Secondary | ICD-10-CM | POA: Diagnosis present

## 2011-01-30 DIAGNOSIS — Z79899 Other long term (current) drug therapy: Secondary | ICD-10-CM

## 2011-01-30 DIAGNOSIS — D72829 Elevated white blood cell count, unspecified: Secondary | ICD-10-CM

## 2011-01-30 DIAGNOSIS — IMO0002 Reserved for concepts with insufficient information to code with codable children: Secondary | ICD-10-CM

## 2011-01-30 DIAGNOSIS — Z9989 Dependence on other enabling machines and devices: Secondary | ICD-10-CM | POA: Diagnosis present

## 2011-01-30 DIAGNOSIS — Z7982 Long term (current) use of aspirin: Secondary | ICD-10-CM

## 2011-01-30 DIAGNOSIS — J45901 Unspecified asthma with (acute) exacerbation: Secondary | ICD-10-CM | POA: Diagnosis present

## 2011-01-30 DIAGNOSIS — Z825 Family history of asthma and other chronic lower respiratory diseases: Secondary | ICD-10-CM

## 2011-01-30 DIAGNOSIS — E669 Obesity, unspecified: Secondary | ICD-10-CM | POA: Diagnosis present

## 2011-01-30 DIAGNOSIS — J1569 Pneumonia due to other gram-negative bacteria: Principal | ICD-10-CM | POA: Diagnosis present

## 2011-01-30 DIAGNOSIS — E785 Hyperlipidemia, unspecified: Secondary | ICD-10-CM | POA: Diagnosis present

## 2011-01-30 DIAGNOSIS — I10 Essential (primary) hypertension: Secondary | ICD-10-CM | POA: Diagnosis present

## 2011-01-30 HISTORY — DX: Chronic obstructive pulmonary disease, unspecified: J44.9

## 2011-01-30 HISTORY — DX: Pneumonia, unspecified organism: J18.9

## 2011-01-30 LAB — DIFFERENTIAL
Basophils Absolute: 0 10*3/uL (ref 0.0–0.1)
Basophils Relative: 0 % (ref 0–1)
Eosinophils Absolute: 0.3 10*3/uL (ref 0.0–0.7)
Eosinophils Relative: 2 % (ref 0–5)
Lymphocytes Relative: 13 % (ref 12–46)
Lymphs Abs: 2.6 10*3/uL (ref 0.7–4.0)
Monocytes Absolute: 1.4 10*3/uL — ABNORMAL HIGH (ref 0.1–1.0)
Monocytes Relative: 7 % (ref 3–12)
Neutro Abs: 15.4 10*3/uL — ABNORMAL HIGH (ref 1.7–7.7)
Neutrophils Relative %: 78 % — ABNORMAL HIGH (ref 43–77)

## 2011-01-30 LAB — BASIC METABOLIC PANEL
BUN: 13 mg/dL (ref 6–23)
CO2: 28 mEq/L (ref 19–32)
Calcium: 10.9 mg/dL — ABNORMAL HIGH (ref 8.4–10.5)
Chloride: 96 mEq/L (ref 96–112)
Creatinine, Ser: 0.82 mg/dL (ref 0.50–1.35)
GFR calc Af Amer: >90 mL/min (ref >90)
GFR calc non Af Amer: 89 mL/min — ABNORMAL LOW (ref >90)
Glucose, Bld: 122 mg/dL — ABNORMAL HIGH (ref 70–99)
Potassium: 4.3 mEq/L (ref 3.5–5.1)
Sodium: 132 mEq/L — ABNORMAL LOW (ref 135–145)

## 2011-01-30 LAB — CBC
HCT: 41.1 % (ref 39.0–52.0)
Hemoglobin: 13.1 g/dL (ref 13.0–17.0)
MCH: 28.7 pg (ref 26.0–34.0)
MCHC: 31.9 g/dL (ref 30.0–36.0)
MCV: 89.9 fL (ref 78.0–100.0)
Platelets: 227 10*3/uL (ref 150–400)
RBC: 4.57 MIL/uL (ref 4.22–5.81)
RDW: 14.5 % (ref 11.5–15.5)
WBC: 19.6 10*3/uL — ABNORMAL HIGH (ref 4.0–10.5)

## 2011-01-30 MED ORDER — ACETAMINOPHEN 650 MG RE SUPP: 650.0000 mg | Freq: Four times a day (QID) | RECTAL | Status: DC | PRN

## 2011-01-30 MED ORDER — ACETAMINOPHEN 325 MG PO TABS
650.0000 mg | ORAL_TABLET | Freq: Four times a day (QID) | ORAL | Status: DC | PRN
  Administered 2011-01-30: 650 mg via ORAL
  Filled 2011-01-30: qty 2

## 2011-01-30 MED ORDER — ENOXAPARIN SODIUM 40 MG/0.4ML ~~LOC~~ SOLN
40.0000 mg | Freq: Every day | SUBCUTANEOUS | Status: DC
  Administered 2011-02-01 (×2): 40 mg via SUBCUTANEOUS
  Filled 2011-01-30 (×3): qty 0.4

## 2011-01-30 MED ORDER — FLUTICASONE PROPIONATE 50 MCG/ACT NA SUSP
2.0000 | Freq: Every day | NASAL | Status: DC
  Administered 2011-01-31 – 2011-02-02 (×3): 2 via NASAL
  Filled 2011-01-30: qty 16

## 2011-01-30 MED ORDER — CYANOCOBALAMIN 250 MCG PO TABS
250.0000 ug | ORAL_TABLET | Freq: Every day | ORAL | Status: DC
  Administered 2011-01-31 – 2011-02-02 (×3): 250 ug via ORAL
  Filled 2011-01-30 (×3): qty 1

## 2011-01-30 MED ORDER — DEXTROSE 5 % IV SOLN
1.0000 g | Freq: Three times a day (TID) | INTRAVENOUS | Status: DC
  Administered 2011-01-30: 1 g via INTRAVENOUS
  Filled 2011-01-30 (×2): qty 1

## 2011-01-30 MED ORDER — VANCOMYCIN HCL IN DEXTROSE 1-5 GM/200ML-% IV SOLN
1000.0000 mg | Freq: Three times a day (TID) | INTRAVENOUS | Status: DC
  Administered 2011-01-31 – 2011-02-02 (×7): 1000 mg via INTRAVENOUS
  Filled 2011-01-30 (×8): qty 200

## 2011-01-30 MED ORDER — CLOBETASOL PROPIONATE 0.05 % EX CREA
1.0000 "application " | TOPICAL_CREAM | Freq: Every day | CUTANEOUS | Status: DC
  Administered 2011-02-02: 1 via TOPICAL
  Filled 2011-01-30: qty 15

## 2011-01-30 MED ORDER — IPRATROPIUM BROMIDE 0.02 % IN SOLN
0.5000 mg | Freq: Once | RESPIRATORY_TRACT | Status: AC
  Administered 2011-01-30: 0.5 mg via RESPIRATORY_TRACT

## 2011-01-30 MED ORDER — GUAIFENESIN ER 600 MG PO TB12
600.0000 mg | ORAL_TABLET | Freq: Two times a day (BID) | ORAL | Status: DC
  Administered 2011-01-30 – 2011-02-02 (×6): 600 mg via ORAL
  Filled 2011-01-30 (×7): qty 1

## 2011-01-30 MED ORDER — ALBUTEROL SULFATE (5 MG/ML) 0.5% IN NEBU
2.5000 mg | INHALATION_SOLUTION | Freq: Four times a day (QID) | RESPIRATORY_TRACT | Status: DC
  Administered 2011-01-31 – 2011-02-01 (×5): 2.5 mg via RESPIRATORY_TRACT
  Filled 2011-01-30 (×5): qty 0.5

## 2011-01-30 MED ORDER — VITAMIN C 500 MG PO TABS
1000.0000 mg | ORAL_TABLET | Freq: Every day | ORAL | Status: DC
  Administered 2011-01-31 – 2011-02-02 (×3): 1000 mg via ORAL
  Filled 2011-01-30 (×3): qty 2

## 2011-01-30 MED ORDER — AMLODIPINE-OLMESARTAN 10-40 MG PO TABS: 1.0000 | ORAL_TABLET | Freq: Every day | ORAL | Status: DC

## 2011-01-30 MED ORDER — LORATADINE 10 MG PO TABS
10.0000 mg | ORAL_TABLET | Freq: Every day | ORAL | Status: DC
  Administered 2011-01-31 – 2011-02-02 (×3): 10 mg via ORAL
  Filled 2011-01-30 (×3): qty 1

## 2011-01-30 MED ORDER — VANCOMYCIN HCL IN DEXTROSE 1-5 GM/200ML-% IV SOLN
1000.0000 mg | Freq: Once | INTRAVENOUS | Status: AC
  Administered 2011-01-30: 1000 mg via INTRAVENOUS
  Filled 2011-01-30: qty 200

## 2011-01-30 MED ORDER — SIMVASTATIN 40 MG PO TABS: 40.0000 mg | ORAL_TABLET | Freq: Every day | ORAL | Status: DC

## 2011-01-30 MED ORDER — MORPHINE SULFATE 2 MG/ML IJ SOLN: 1.0000 mg | INTRAMUSCULAR | Status: DC | PRN

## 2011-01-30 MED ORDER — MONTELUKAST SODIUM 10 MG PO TABS
10.0000 mg | ORAL_TABLET | Freq: Every day | ORAL | Status: DC
  Administered 2011-01-31 – 2011-02-01 (×2): 10 mg via ORAL
  Filled 2011-01-30 (×3): qty 1

## 2011-01-30 MED ORDER — OLMESARTAN MEDOXOMIL 40 MG PO TABS
40.0000 mg | ORAL_TABLET | Freq: Every day | ORAL | Status: DC
  Administered 2011-01-31 – 2011-02-02 (×3): 40 mg via ORAL
  Filled 2011-01-30 (×3): qty 1

## 2011-01-30 MED ORDER — IPRATROPIUM BROMIDE 0.02 % IN SOLN
0.5000 mg | Freq: Four times a day (QID) | RESPIRATORY_TRACT | Status: DC
  Administered 2011-01-31 – 2011-02-02 (×8): 0.5 mg via RESPIRATORY_TRACT
  Filled 2011-01-30 (×9): qty 2.5

## 2011-01-30 MED ORDER — ALBUTEROL SULFATE (5 MG/ML) 0.5% IN NEBU
INHALATION_SOLUTION | RESPIRATORY_TRACT | Status: AC
  Filled 2011-01-30: qty 1

## 2011-01-30 MED ORDER — SODIUM CHLORIDE 0.9 % IV SOLN
INTRAVENOUS | Status: DC
  Administered 2011-01-30: 125 mL/h via INTRAVENOUS
  Administered 2011-01-31: 22:00:00 via INTRAVENOUS

## 2011-01-30 MED ORDER — ROSUVASTATIN CALCIUM 10 MG PO TABS
10.0000 mg | ORAL_TABLET | Freq: Every day | ORAL | Status: DC
  Administered 2011-01-31 – 2011-02-01 (×2): 10 mg via ORAL
  Filled 2011-01-30 (×3): qty 1

## 2011-01-30 MED ORDER — SERTRALINE HCL 100 MG PO TABS
100.0000 mg | ORAL_TABLET | Freq: Every day | ORAL | Status: DC
  Administered 2011-01-31 – 2011-02-02 (×3): 100 mg via ORAL
  Filled 2011-01-30 (×3): qty 1

## 2011-01-30 MED ORDER — VITAMIN D3 25 MCG (1000 UNIT) PO TABS
1000.0000 [IU] | ORAL_TABLET | Freq: Every day | ORAL | Status: DC
  Administered 2011-01-31 – 2011-02-02 (×3): 1000 [IU] via ORAL
  Filled 2011-01-30 (×3): qty 1

## 2011-01-30 MED ORDER — ALBUTEROL SULFATE (5 MG/ML) 0.5% IN NEBU
5.0000 mg | INHALATION_SOLUTION | Freq: Once | RESPIRATORY_TRACT | Status: AC
  Administered 2011-01-30: 5 mg via RESPIRATORY_TRACT

## 2011-01-30 MED ORDER — ALBUTEROL SULFATE (5 MG/ML) 0.5% IN NEBU
5.0000 mg | INHALATION_SOLUTION | Freq: Once | RESPIRATORY_TRACT | Status: AC
  Administered 2011-01-30: 5 mg via RESPIRATORY_TRACT
  Filled 2011-01-30: qty 1

## 2011-01-30 MED ORDER — TRAVOPROST (BAK FREE) 0.004 % OP SOLN
1.0000 [drp] | Freq: Every day | OPHTHALMIC | Status: DC
  Administered 2011-02-01 (×2): 1 [drp] via OPHTHALMIC
  Filled 2011-01-30: qty 2.5

## 2011-01-30 MED ORDER — AMLODIPINE BESYLATE 10 MG PO TABS
10.0000 mg | ORAL_TABLET | Freq: Every day | ORAL | Status: DC
  Administered 2011-01-31 – 2011-02-02 (×3): 10 mg via ORAL
  Filled 2011-01-30 (×3): qty 1

## 2011-01-30 MED ORDER — ALBUTEROL SULFATE (5 MG/ML) 0.5% IN NEBU
2.5000 mg | INHALATION_SOLUTION | RESPIRATORY_TRACT | Status: DC | PRN
  Administered 2011-02-01: 2.5 mg via RESPIRATORY_TRACT
  Filled 2011-01-30: qty 0.5

## 2011-01-30 MED ORDER — FLORA-Q PO CAPS
1.0000 | ORAL_CAPSULE | Freq: Every day | ORAL | Status: DC
  Administered 2011-01-31 – 2011-02-02 (×3): 1 via ORAL
  Filled 2011-01-30 (×3): qty 1

## 2011-01-30 MED ORDER — PIPERACILLIN-TAZOBACTAM 3.375 G IVPB
3.3750 g | Freq: Once | INTRAVENOUS | Status: AC
  Administered 2011-01-30: 3.375 g via INTRAVENOUS
  Filled 2011-01-30: qty 50

## 2011-01-30 NOTE — ED Notes (Signed)
Pt ambulated w/out O2 per request of Dr Weldon Inches.  Sats decreased to 89% on RA and hr increased to 125.  Pt stated increased sob and fatigue.  Dr Weldon Inches notified.

## 2011-01-30 NOTE — ED Provider Notes (Signed)
History     CSN: [id number] Arrival date & time: 01/30/2011  1:43 PM   First MD Initiated Contact with Patient 01/30/11 1402      Chief Complaint  Patient presents with  . Shortness of Breath  . Cough  . Nasal Congestion    (Consider location/radiation/quality/duration/timing/severity/associated sxs/prior treatment) Patient is a 66 y.o. male presenting with shortness of breath and cough. The history is provided by the patient and the spouse.  Shortness of Breath  The current episode started more than 2 weeks ago (seen by lmd 11/6 given avelox , sx getting worse.). The problem has been gradually worsening. The problem is moderate. Associated symptoms include cough and shortness of breath. He has not inhaled smoke recently. His past medical history is significant for asthma.  Cough Associated symptoms include shortness of breath. His past medical history is significant for asthma.    Past Medical History  Diagnosis Date  . Diabetes mellitus   . Hyperlipemia   . Hypertension   . Arthritis   . OSA on CPAP     No past surgical history on file.  Family History  Problem Relation Age of Onset  . Emphysema Father   . Asthma Father   . Heart disease Father     History  Substance Use Topics  . Smoking status: Former Smoker -- 1.0 packs/day for 20 years    Types: Cigarettes    Quit date: 03/09/1985  . Smokeless tobacco: Never Used  . Alcohol Use: No      Review of Systems  Constitutional: Negative.   HENT: Negative.   Respiratory: Positive for cough and shortness of breath.   Cardiovascular: Negative.     Allergies  Codeine  Home Medications   Current Outpatient Rx  Name Route Sig Dispense Refill  . ALBUTEROL SULFATE HFA 108 (90 BASE) MCG/ACT IN AERS Inhalation Inhale 2 puffs into the lungs 4 (four) times daily.      Marland Kitchen AMLODIPINE-OLMESARTAN 10-40 MG PO TABS Oral Take 1 tablet by mouth daily.      Marland Kitchen VITAMIN C 1000 MG PO TABS Oral Take 1,000 mg by mouth daily.       . ASPIRIN 325 MG PO TABS Oral Take 325 mg by mouth daily.      . BUDESONIDE-FORMOTEROL FUMARATE 160-4.5 MCG/ACT IN AERO Inhalation Inhale 2 puffs into the lungs 2 (two) times daily.      . CELLULOSE CARMELLOSE SODIUM POWD Does not apply by Does not apply route. Apply to feet as directed     . CHOLECALCIFEROL 1000 UNITS PO TABS Oral Take 1,000 Units by mouth daily.      Marland Kitchen CLINDAMYCIN PHOS-BENZOYL PEROX 1-5 % EX GEL Topical Apply topically. Use as directed     . CLOBETASOL PROPIONATE 0.05 % EX CREA Topical Apply topically. Use as directed     . CLOTRIMAZOLE 1 % EX CREA  As directed as needed     . FEXOFENADINE HCL 180 MG PO TABS Oral Take 180 mg by mouth daily.      Marland Kitchen FLUOCINOLONE ACETONIDE 0.01 % EX CREA Topical Apply topically 2 (two) times daily.      Marland Kitchen FLUTICASONE PROPIONATE 50 MCG/ACT NA SUSP Nasal 2 sprays by Nasal route daily.      Marland Kitchen KETOCONAZOLE 2 % EX CREA Topical Apply topically daily.      Marland Kitchen KETOCON PLUS EX  Daily as directed     . METFORMIN HCL 500 MG PO TABS Oral Take 2 tablets by  mouth Twice daily.    Marland Kitchen MONTELUKAST SODIUM 10 MG PO TABS Oral Take 10 mg by mouth at bedtime.      . OMEGA-3-ACID ETHYL ESTERS 1 G PO CAPS Oral Take 2 g by mouth 2 (two) times daily.      Marland Kitchen OMEPRAZOLE 20 MG PO CPDR Oral Take 20 mg by mouth daily.      Marland Kitchen ALIGN 4 MG PO CAPS Oral Take 1 capsule by mouth daily.      Marland Kitchen SAXAGLIPTIN HCL 5 MG PO TABS Oral Take by mouth daily.      . SERTRALINE HCL 100 MG PO TABS Oral Take 100 mg by mouth daily.      Marland Kitchen SIMVASTATIN 40 MG PO TABS Oral Take 40 mg by mouth at bedtime.      . DERMACERIN EX CREA Apply externally Apply topically. Use as direct     . TRAVOPROST 0.004 % OP SOLN Both Eyes Place 1 drop into both eyes at bedtime.      . TRIAMCINOLONE ACETONIDE 0.1 % EX CREA Topical Apply topically 2 (two) times daily.      Marland Kitchen VITAMIN B-12 250 MCG PO TABS Oral Take 250 mcg by mouth daily.        BP 102/76  Pulse 126  Temp(Src) 100.1 F (37.8 C) (Oral)  Resp 24  SpO2  94%  Physical Exam  Nursing note and vitals reviewed. Constitutional: He appears well-developed and well-nourished.  HENT:  Head: Normocephalic.  Right Ear: External ear normal.  Left Ear: External ear normal.  Nose: Nose normal.  Mouth/Throat: Oropharynx is clear and moist.  Eyes: Conjunctivae and EOM are normal. Pupils are equal, round, and reactive to light.  Neck: Normal range of motion. Neck supple. No JVD present.  Cardiovascular: Normal rate, regular rhythm, normal heart sounds and intact distal pulses.   Pulmonary/Chest: Effort normal. He has decreased breath sounds in the right upper field and the right middle field. He has wheezes in the right upper field and the right middle field. He has rhonchi in the right upper field and the right middle field. He has rales in the right upper field and the right middle field.  Lymphadenopathy:    He has no cervical adenopathy.  Skin: Skin is warm and dry.    ED Course  Procedures (including critical care time)  Labs Reviewed - No data to display No results found.   No diagnosis found.    MDM  X-rays reviewed and report per radiologist.         Barkley Bruns, MD 01/30/11 1450

## 2011-01-30 NOTE — H&P (Signed)
Douglas Hansen is an 65 y.o. male.   Chief Complaint: Shortness of Breath HPI: 65 YO who has been on out patient treatment for Bronchitis/Pneumonia with Avelox and Steroids but not getting better. Here with SOB, cough, fever. CXR shows Right Middle lobe Pneumonia. Patient denies chest pain, no hemoptysis, no NVD. His oxygen saturation drops to upper 80s in the Emergency room. He has history of Obstructive Sleep Apnea and uses CPAP at home. He seems to have failed Out patient treatment.   Past Medical History  Diagnosis Date  . Diabetes mellitus   . Hyperlipemia   . Hypertension   . Arthritis   . OSA on CPAP   . Asthma   . Pneumonia     History reviewed. No pertinent past surgical history.  Family History  Problem Relation Age of Onset  . Emphysema Father   . Asthma Father   . Heart disease Father    Social History:  reports that he quit smoking about 25 years ago. His smoking use included Cigarettes. He has a 20 pack-year smoking history. He has never used smokeless tobacco. He reports that he does not drink alcohol or use illicit drugs.  Allergies:  Allergies  Allergen Reactions  . Codeine Other (See Comments)    REACTION: GI upset    Medications Prior to Admission  Medication Dose Route Frequency Provider Last Rate Last Dose  . albuterol (PROVENTIL) (5 MG/ML) 0.5% nebulizer solution 5 mg  5 mg Nebulization Once Barkley Bruns, MD   5 mg at 01/30/11 1444  . albuterol (PROVENTIL) (5 MG/ML) 0.5% nebulizer solution 5 mg  5 mg Nebulization Once Nicholes Stairs, MD   5 mg at 01/30/11 2042  . ipratropium (ATROVENT) nebulizer solution 0.5 mg  0.5 mg Nebulization Once Barkley Bruns, MD   0.5 mg at 01/30/11 1445  . piperacillin-tazobactam (ZOSYN) IVPB 3.375 g  3.375 g Intravenous Once Nicholes Stairs, MD   3.375 g at 01/30/11 2049  . vancomycin (VANCOCIN) IVPB 1000 mg/200 mL premix  1,000 mg Intravenous Once Nicholes Stairs, MD       Medications Prior to Admission   Medication Sig Dispense Refill  . albuterol (VENTOLIN HFA) 108 (90 BASE) MCG/ACT inhaler Inhale 2 puffs into the lungs 4 (four) times daily.        Marland Kitchen amLODipine-olmesartan (AZOR) 10-40 MG per tablet Take 1 tablet by mouth daily.        . Ascorbic Acid (VITAMIN C) 1000 MG tablet Take 1,000 mg by mouth daily.        Marland Kitchen aspirin 325 MG tablet Take 325 mg by mouth daily.        . budesonide-formoterol (SYMBICORT) 160-4.5 MCG/ACT inhaler Inhale 2 puffs into the lungs 2 (two) times daily.        . Cellulose Carmellose Sodium POWD Apply 1 application topically daily. Apply to feet      . Cholecalciferol (VITAMIN D-1000 MAX ST) 1000 UNITS tablet Take 1,000 Units by mouth daily.        . clindamycin-benzoyl peroxide (BENZACLIN) gel Apply 1 application topically daily at 3 pm.       . clobetasol (TEMOVATE) 0.05 % cream Apply 1 application topically daily.       . clotrimazole (LOTRIMIN) 1 % cream Apply 1 application topically daily as needed. For skin condition      . fexofenadine (ALLEGRA) 180 MG tablet Take 180 mg by mouth daily.        . fluocinolone (  VANOS) 0.01 % cream Apply 1 application topically daily.       . fluticasone (FLONASE) 50 MCG/ACT nasal spray 2 sprays by Nasal route daily.        Marland Kitchen ketoconazole (NIZORAL) 2 % cream Apply topically daily.        Marland Kitchen Ketoconazole-Hydrocortisone (KETOCON PLUS EX) Apply 1 application topically daily.       . metFORMIN (GLUCOPHAGE) 500 MG tablet Take 2 tablets by mouth Twice daily.      . montelukast (SINGULAIR) 10 MG tablet Take 10 mg by mouth at bedtime.        . Probiotic Product (ALIGN) 4 MG CAPS Take 1 capsule by mouth daily.        . sertraline (ZOLOFT) 100 MG tablet Take 100 mg by mouth daily.        . simvastatin (ZOCOR) 40 MG tablet Take 40 mg by mouth at bedtime.        . Skin Protectants, Misc. (DERMACERIN) CREA Apply 1 application topically daily as needed.       . travoprost, benzalkonium, (TRAVATAN Z) 0.004 % ophthalmic solution Place 1 drop  into both eyes at bedtime.        . triamcinolone (KENALOG) 0.1 % cream Apply topically 2 (two) times daily.        . vitamin B-12 (CYANOCOBALAMIN) 250 MCG tablet Take 250 mcg by mouth daily.          Results for orders placed during the hospital encounter of 01/30/11 (from the past 48 hour(s))  CBC     Status: Abnormal   Collection Time   01/30/11  6:59 PM      Component Value Range Comment   WBC 19.6 (*) 4.0 - 10.5 (K/uL)    RBC 4.57  4.22 - 5.81 (MIL/uL)    Hemoglobin 13.1  13.0 - 17.0 (g/dL)    HCT 04.5  40.9 - 81.1 (%)    MCV 89.9  78.0 - 100.0 (fL)    MCH 28.7  26.0 - 34.0 (pg)    MCHC 31.9  30.0 - 36.0 (g/dL)    RDW 91.4  78.2 - 95.6 (%)    Platelets 227  150 - 400 (K/uL)   DIFFERENTIAL     Status: Abnormal   Collection Time   01/30/11  6:59 PM      Component Value Range Comment   Neutrophils Relative 78 (*) 43 - 77 (%)    Neutro Abs 15.4 (*) 1.7 - 7.7 (K/uL)    Lymphocytes Relative 13  12 - 46 (%)    Lymphs Abs 2.6  0.7 - 4.0 (K/uL)    Monocytes Relative 7  3 - 12 (%)    Monocytes Absolute 1.4 (*) 0.1 - 1.0 (K/uL)    Eosinophils Relative 2  0 - 5 (%)    Eosinophils Absolute 0.3  0.0 - 0.7 (K/uL)    Basophils Relative 0  0 - 1 (%)    Basophils Absolute 0.0  0.0 - 0.1 (K/uL)   BASIC METABOLIC PANEL     Status: Abnormal   Collection Time   01/30/11  6:59 PM      Component Value Range Comment   Sodium 132 (*) 135 - 145 (mEq/L)    Potassium 4.3  3.5 - 5.1 (mEq/L)    Chloride 96  96 - 112 (mEq/L)    CO2 28  19 - 32 (mEq/L)    Glucose, Bld 122 (*) 70 - 99 (mg/dL)  BUN 13  6 - 23 (mg/dL)    Creatinine, Ser 1.61  0.50 - 1.35 (mg/dL)    Calcium 09.6 (*) 8.4 - 10.5 (mg/dL)    GFR calc non Af Amer 89 (*) >90 (mL/min)    GFR calc Af Amer >90  >90 (mL/min)    Dg Chest 2 View  01/30/2011  *RADIOLOGY REPORT*  Clinical Data: Cough and shortness of breath.  CHEST - 2 VIEW  Comparison: 10/28/2010.  Findings: Trachea is midline.  Heart size is accentuated by technique and low  lung volumes.  There is air space consolidation in the right upper lobe with probable atelectasis at both lung bases.  No definite pleural fluid.  IMPRESSION: Right upper lobe pneumonia.  Follow-up to clearing is recommended.  Original Report Authenticated By: Reyes Ivan, M.D.    Review of Systems  Constitutional: Positive for fever and chills. Negative for weight loss and diaphoresis.  HENT: Negative.   Eyes: Negative.   Respiratory: Positive for cough, sputum production, shortness of breath and wheezing. Negative for hemoptysis.   Cardiovascular: Negative.   Gastrointestinal: Negative.  Negative for heartburn, nausea and vomiting.  Genitourinary: Negative.   Musculoskeletal: Negative.   Skin: Negative.   Neurological: Negative.   Endo/Heme/Allergies: Negative.   Psychiatric/Behavioral: Negative.     Blood pressure 130/96, pulse 114, temperature 98.4 F (36.9 C), temperature source Oral, resp. rate 18, SpO2 96.00%. Physical Exam  Constitutional: He is oriented to person, place, and time. He appears well-developed and well-nourished.  HENT:  Head: Normocephalic and atraumatic.  Eyes: Conjunctivae and EOM are normal. Pupils are equal, round, and reactive to light.  Neck: Normal range of motion. Neck supple.  Cardiovascular: Normal rate, regular rhythm and normal heart sounds.   Respiratory: He is in respiratory distress. He has decreased breath sounds in the right lower field and the left lower field. He has wheezes in the right lower field and the left lower field. He has no rhonchi. He has no rales.  GI: Soft. Bowel sounds are normal.  Musculoskeletal: Normal range of motion.  Neurological: He is alert and oriented to person, place, and time. He has normal reflexes.  Skin: Skin is warm and dry. Lesion and rash noted.  Psychiatric: He has a normal mood and affect.     Assessment/Plan 1. HCAP: Will treat with vancomycin and Aztreonam for HCAP since he has failed quinolone as  out patient. Will get Blood and sputum cultures if possible. 2. Hypoxemia: Oxygen and treat underlying causes. 3. DM: Will hold metformin and start SSI 4. HTN: Controlled. Continue home medications 5. Hyperlipidemia:  Continue home medications 6.OSA: Continue CPAP at night.  GARBA,LAWAL 01/30/2011, 9:20 PM

## 2011-01-30 NOTE — Progress Notes (Signed)
ANTIBIOTIC CONSULT NOTE - INITIAL  Pharmacy Consult for vancomycin  Indication: pneumonia  Allergies  Allergen Reactions  . Codeine Other (See Comments)    REACTION: GI upset    Patient Measurements: Height: 5\' 7"  (170.2 cm) Weight: 217 lb 13 oz (98.8 kg) IBW/kg (Calculated) : 66.1   Vital Signs: Temp: 97.4 F (36.3 C) (11/23 2248) Temp src: Oral (11/23 2248) BP: 106/65 mmHg (11/23 2248) Pulse Rate: 97  (11/23 2248)  Labs:  Basename 01/30/11 1859  WBC 19.6*  HGB 13.1  PLT 227  LABCREA --  CREATININE 0.82   Estimated Creatinine Clearance: 97.9 ml/min (by C-G formula based on Cr of 0.82).  Microbiology: No results found for this or any previous visit (from the past 720 hour(s)).  Medical History: Past Medical History  Diagnosis Date  . Diabetes mellitus   . Hyperlipemia   . Hypertension   . Arthritis   . OSA on CPAP   . Asthma   . Pneumonia   . COPD (chronic obstructive pulmonary disease)    Medications:  Prescriptions prior to admission  Medication Sig Dispense Refill  . albuterol (VENTOLIN HFA) 108 (90 BASE) MCG/ACT inhaler Inhale 2 puffs into the lungs 4 (four) times daily.        Marland Kitchen amLODipine-olmesartan (AZOR) 10-40 MG per tablet Take 1 tablet by mouth daily.        . Ascorbic Acid (VITAMIN C) 1000 MG tablet Take 1,000 mg by mouth daily.        Marland Kitchen aspirin 325 MG tablet Take 325 mg by mouth daily.        . budesonide-formoterol (SYMBICORT) 160-4.5 MCG/ACT inhaler Inhale 2 puffs into the lungs 2 (two) times daily.        . Cellulose Carmellose Sodium POWD Apply 1 application topically daily. Apply to feet      . Cholecalciferol (VITAMIN D-1000 MAX ST) 1000 UNITS tablet Take 1,000 Units by mouth daily.        . clindamycin-benzoyl peroxide (BENZACLIN) gel Apply 1 application topically daily at 3 pm.       . clobetasol (TEMOVATE) 0.05 % cream Apply 1 application topically daily.       . clotrimazole (LOTRIMIN) 1 % cream Apply 1 application topically daily as  needed. For skin condition      . fexofenadine (ALLEGRA) 180 MG tablet Take 180 mg by mouth daily.        . fluocinolone (VANOS) 0.01 % cream Apply 1 application topically daily.       . fluticasone (FLONASE) 50 MCG/ACT nasal spray 2 sprays by Nasal route daily.        Marland Kitchen ketoconazole (NIZORAL) 2 % cream Apply topically daily.        Marland Kitchen Ketoconazole-Hydrocortisone (KETOCON PLUS EX) Apply 1 application topically daily.       . metFORMIN (GLUCOPHAGE) 500 MG tablet Take 2 tablets by mouth Twice daily.      . montelukast (SINGULAIR) 10 MG tablet Take 10 mg by mouth at bedtime.        . Probiotic Product (ALIGN) 4 MG CAPS Take 1 capsule by mouth daily.        . sertraline (ZOLOFT) 100 MG tablet Take 100 mg by mouth daily.        . simvastatin (ZOCOR) 40 MG tablet Take 40 mg by mouth at bedtime.        . Skin Protectants, Misc. (DERMACERIN) CREA Apply 1 application topically daily as needed.       Marland Kitchen  travoprost, benzalkonium, (TRAVATAN Z) 0.004 % ophthalmic solution Place 1 drop into both eyes at bedtime.        . triamcinolone (KENALOG) 0.1 % cream Apply topically 2 (two) times daily.        . vitamin B-12 (CYANOCOBALAMIN) 250 MCG tablet Take 250 mcg by mouth daily.         Assessment: 65yo male c/o worsening PNA Sx despite outpt tx with Avelox and steroids, CXR shows RML PNA, to begin IV ABX.  Goal of Therapy:  Vancomycin trough level 15-20 mcg/ml  Plan:  Rec'd vanc 1g in ED; will continue therapy with vancomycin 1000mg  IV Q8H and monitor CBC, Cx, levels prn.  Colleen Can PharmD BCPS 01/30/2011,11:16 PM

## 2011-01-30 NOTE — ED Notes (Signed)
Pt in waiting room and family member states he is having a hard time breathing.  Pt sob.  Pulse ox 91%.  Pt taken to treatment room by CJ, NT.

## 2011-01-30 NOTE — ED Notes (Signed)
Onset: x 3 weeks ago. Congestion, followed by deep coughs; 01/02/2011: went to [facility] and prescribed z - pak; 01/13/11 - pcp - prescribed prednisone.

## 2011-01-30 NOTE — ED Provider Notes (Signed)
History     CSN: [id number] Arrival date & time: 01/30/2011  5:04 PM   First MD Initiated Contact with Patient 01/30/11 1841      Chief Complaint  Patient presents with  . Pneumonia    (Consider location/radiation/quality/duration/timing/severity/associated sxs/prior treatment) Patient is a 65 y.o. male presenting with pneumonia. The history is provided by the patient, medical records and a relative.  Pneumonia Associated symptoms include shortness of breath. Pertinent negatives include no chest pain and no headaches.   the patient is a 65 year old, male, who has a history of bronchitis, and was treated with Avelox.  A tapering dose to steroids by his primary care doctor, Dr. Pearson Grippe.  He presents with progressive shortness of breath, cough, with white sputum, and a fever earlier today.  He has sweating at night time.  He denies pain anywhere.  He denies lower extremity swelling.  He does not use oxygen at home.  Past Medical History  Diagnosis Date  . Diabetes mellitus   . Hyperlipemia   . Hypertension   . Arthritis   . OSA on CPAP   . Asthma     History reviewed. No pertinent past surgical history.  Family History  Problem Relation Age of Onset  . Emphysema Father   . Asthma Father   . Heart disease Father     History  Substance Use Topics  . Smoking status: Former Smoker -- 1.0 packs/day for 20 years    Types: Cigarettes    Quit date: 03/09/1985  . Smokeless tobacco: Never Used  . Alcohol Use: No      Review of Systems  Constitutional: Positive for fever, chills and diaphoresis.  HENT: Negative for congestion.   Eyes: Negative for redness.  Respiratory: Positive for cough and shortness of breath.   Cardiovascular: Negative for chest pain.  Gastrointestinal: Positive for abdominal distention.  Genitourinary: Negative for dysuria and hematuria.  Musculoskeletal: Negative for back pain.  Neurological: Negative for headaches.  Psychiatric/Behavioral:  Negative for confusion.    Allergies  Codeine  Home Medications   Current Outpatient Rx  Name Route Sig Dispense Refill  . ALBUTEROL SULFATE HFA 108 (90 BASE) MCG/ACT IN AERS Inhalation Inhale 2 puffs into the lungs 4 (four) times daily.      Marland Kitchen AMLODIPINE-OLMESARTAN 10-40 MG PO TABS Oral Take 1 tablet by mouth daily.      Marland Kitchen VITAMIN C 1000 MG PO TABS Oral Take 1,000 mg by mouth daily.      . ASPIRIN 325 MG PO TABS Oral Take 325 mg by mouth daily.      . BUDESONIDE-FORMOTEROL FUMARATE 160-4.5 MCG/ACT IN AERO Inhalation Inhale 2 puffs into the lungs 2 (two) times daily.      . CELLULOSE CARMELLOSE SODIUM POWD Topical Apply 1 application topically daily. Apply to feet    . CHOLECALCIFEROL 1000 UNITS PO TABS Oral Take 1,000 Units by mouth daily.      Marland Kitchen CLINDAMYCIN PHOS-BENZOYL PEROX 1-5 % EX GEL Topical Apply 1 application topically daily at 3 pm.     . CLOBETASOL PROPIONATE 0.05 % EX CREA Topical Apply 1 application topically daily.     Marland Kitchen CLOTRIMAZOLE 1 % EX CREA Topical Apply 1 application topically daily as needed. For skin condition    . FEXOFENADINE HCL 180 MG PO TABS Oral Take 180 mg by mouth daily.      Marland Kitchen FLUOCINOLONE ACETONIDE 0.01 % EX CREA Topical Apply 1 application topically daily.     Marland Kitchen FLUTICASONE  PROPIONATE 50 MCG/ACT NA SUSP Nasal 2 sprays by Nasal route daily.      Marland Kitchen KETOCONAZOLE 2 % EX CREA Topical Apply topically daily.      Marland Kitchen KETOCON PLUS EX Topical Apply 1 application topically daily.     Marland Kitchen METFORMIN HCL 500 MG PO TABS Oral Take 2 tablets by mouth Twice daily.    Marland Kitchen MONTELUKAST SODIUM 10 MG PO TABS Oral Take 10 mg by mouth at bedtime.      Marland Kitchen ALIGN 4 MG PO CAPS Oral Take 1 capsule by mouth daily.      . SERTRALINE HCL 100 MG PO TABS Oral Take 100 mg by mouth daily.      Marland Kitchen SIMVASTATIN 40 MG PO TABS Oral Take 40 mg by mouth at bedtime.      . DERMACERIN EX CREA Topical Apply 1 application topically daily as needed.     . TRAVOPROST 0.004 % OP SOLN Both Eyes Place 1 drop  into both eyes at bedtime.      . TRIAMCINOLONE ACETONIDE 0.1 % EX CREA Topical Apply topically 2 (two) times daily.      Marland Kitchen VITAMIN B-12 250 MCG PO TABS Oral Take 250 mcg by mouth daily.        BP 130/96  Pulse 114  Temp(Src) 98.4 F (36.9 C) (Oral)  Resp 18  SpO2 96%  Physical Exam  Constitutional: He is oriented to person, place, and time. He appears well-developed and well-nourished. No distress.  HENT:  Head: Normocephalic and atraumatic.  Eyes: EOM are normal. Pupils are equal, round, and reactive to light.  Neck: Normal range of motion. Neck supple.  Cardiovascular: Normal rate, regular rhythm and normal heart sounds.   No murmur heard. Pulmonary/Chest: Effort normal and breath sounds normal. No respiratory distress. He has no wheezes. He has no rales.  Abdominal: Soft. Bowel sounds are normal. He exhibits no distension and no mass. There is no tenderness. There is no rebound and no guarding.  Musculoskeletal: Normal range of motion. He exhibits no edema and no tenderness.  Neurological: He is alert and oriented to person, place, and time. No cranial nerve deficit.  Skin: Skin is warm and dry. He is not diaphoretic.  Psychiatric: He has a normal mood and affect. His behavior is normal.    ED Course  Procedures (including critical care time) I turned his oxygen down.  He maintains his room air oxygen sat 2 at 94-97%.  We will add a CBC, and a bmet or further evaluation.  I reviewed the chest x-ray of which was done at the urgent care center earlier today.  He demonstrates a right upper lobe pneumonia.  He is in no distress and he has no significant comorbid illnesses, that increases or score.  Therefore, depending on his test, results, and clinical condition.  He may admit him to the hospital or change his antibiotic given him an antitussive, and arrange for followup with his primary care physician.   Labs Reviewed  CBC  DIFFERENTIAL  BASIC METABOLIC PANEL   Dg Chest 2  View  01/30/2011  *RADIOLOGY REPORT*  Clinical Data: Cough and shortness of breath.  CHEST - 2 VIEW  Comparison: 10/28/2010.  Findings: Trachea is midline.  Heart size is accentuated by technique and low lung volumes.  There is air space consolidation in the right upper lobe with probable atelectasis at both lung bases.  No definite pleural fluid.  IMPRESSION: Right upper lobe pneumonia.  Follow-up to clearing  is recommended.  Original Report Authenticated By: Reyes Ivan, M.D.     8:39 PM Patient ambulated in the ED and desatted to 89% on room air.  We will put him in the hospital for treatment of pneumonia.  I spoke with the [facility] hospitalist.  He will admit to the hospital for further treatment   MDM  Cooked, community acquired pneumonia with prior antibiotic use for bronchitis.  Leukocytosis.  Room air hypoxia with exertion.        Nicholes Stairs, MD 01/30/11 2040

## 2011-01-30 NOTE — ED Notes (Signed)
Pt reports hard to catch is breath over the past week getting worse. Productive cough with white sputum. Pt noted to have wheezes throughout upper lung fields. Pt dozing off during questioning. Pox 94% initially. Pt tried to lay down on cart here at Urgent Care and started coughing uncontrolably. Pt eval by Dr Artis Flock.

## 2011-01-30 NOTE — ED Notes (Signed)
Sent by ucc for further aggressive ? Inpatient treatment.

## 2011-01-30 NOTE — ED Notes (Signed)
Dr Weldon Inches does not feel blood cultures are needed at this time.  Antibiotics started.

## 2011-01-30 NOTE — ED Notes (Signed)
Attempted to call report.  Floor states RN is with another pt and that I should try to call report in 10 min.

## 2011-01-30 NOTE — ED Notes (Signed)
Pt states improved sob after breathing tx.   No wheezes noted.

## 2011-01-30 NOTE — ED Notes (Signed)
He was sent down from ucc with a diahnosis of pneumonia.  He has been ill for 3 weeks with a temp  And he has been coughing up white sputum

## 2011-01-30 NOTE — ED Notes (Signed)
Pt given happy meal.  Resp Tech notified of breathing tx.

## 2011-01-31 ENCOUNTER — Other Ambulatory Visit: Payer: Self-pay

## 2011-01-31 LAB — GLUCOSE, CAPILLARY: Glucose-Capillary: 114 mg/dL — ABNORMAL HIGH (ref 70–99)

## 2011-01-31 LAB — COMPREHENSIVE METABOLIC PANEL
ALT: 22 U/L (ref 0–53)
AST: 25 U/L (ref 0–37)
Albumin: 3 g/dL — ABNORMAL LOW (ref 3.5–5.2)
Alkaline Phosphatase: 55 U/L (ref 39–117)
BUN: 13 mg/dL (ref 6–23)
CO2: 29 mEq/L (ref 19–32)
Calcium: 10.2 mg/dL (ref 8.4–10.5)
Chloride: 102 mEq/L (ref 96–112)
Creatinine, Ser: 0.83 mg/dL (ref 0.50–1.35)
GFR calc Af Amer: >90 mL/min (ref >90)
GFR calc non Af Amer: 89 mL/min — ABNORMAL LOW (ref >90)
Glucose, Bld: 120 mg/dL — ABNORMAL HIGH (ref 70–99)
Potassium: 4.1 mEq/L (ref 3.5–5.1)
Sodium: 139 mEq/L (ref 135–145)
Total Bilirubin: 0.3 mg/dL (ref 0.3–1.2)
Total Protein: 6.2 g/dL (ref 6.0–8.3)

## 2011-01-31 LAB — CBC
HCT: 38.5 % — ABNORMAL LOW (ref 39.0–52.0)
HCT: 38.6 % — ABNORMAL LOW (ref 39.0–52.0)
Hemoglobin: 12.4 g/dL — ABNORMAL LOW (ref 13.0–17.0)
Hemoglobin: 12.4 g/dL — ABNORMAL LOW (ref 13.0–17.0)
MCH: 29 pg (ref 26.0–34.0)
MCH: 28.8 pg (ref 26.0–34.0)
MCHC: 32.2 g/dL (ref 30.0–36.0)
MCHC: 32.1 g/dL (ref 30.0–36.0)
MCV: 90 fL (ref 78.0–100.0)
MCV: 89.8 fL (ref 78.0–100.0)
Platelets: 224 10*3/uL (ref 150–400)
Platelets: 213 10*3/uL (ref 150–400)
RBC: 4.28 MIL/uL (ref 4.22–5.81)
RBC: 4.3 MIL/uL (ref 4.22–5.81)
RDW: 14.6 % (ref 11.5–15.5)
RDW: 14.8 % (ref 11.5–15.5)
WBC: 18.3 10*3/uL — ABNORMAL HIGH (ref 4.0–10.5)
WBC: 15.6 10*3/uL — ABNORMAL HIGH (ref 4.0–10.5)

## 2011-01-31 LAB — CREATININE, SERUM
Creatinine, Ser: 0.92 mg/dL (ref 0.50–1.35)
GFR calc Af Amer: >90 mL/min (ref >90)
GFR calc non Af Amer: 85 mL/min — ABNORMAL LOW (ref >90)

## 2011-01-31 MED ORDER — PIPERACILLIN-TAZOBACTAM 3.375 G IVPB 30 MIN
3.3750 g | Freq: Three times a day (TID) | INTRAVENOUS | Status: DC
  Administered 2011-01-31 – 2011-02-02 (×7): 3.375 g via INTRAVENOUS
  Filled 2011-01-31 (×9): qty 50

## 2011-01-31 NOTE — Progress Notes (Signed)
Subjective: Patient seen and examined this am. Son at bedside. Feels his cough and SOB to be better.   Objective:  Vital signs in last 24 hours:  Filed Vitals:   01/31/11 0553 01/31/11 0707 01/31/11 1300 01/31/11 1500  BP: 112/67  110/66 169/63  Pulse: 86  91 64  Temp: 97.9 F (36.6 C)  98.6 F (37 C) 97.8 F (36.6 C)  TempSrc: Oral  Oral Oral  Resp: 18  16 16   Height:      Weight:      SpO2: 95% 96% 94% 94%    Intake/Output from previous day:   Intake/Output Summary (Last 24 hours) at 01/31/11 1806 Last data filed at 01/31/11 0542  Gross per 24 hour  Intake   1100 ml  Output    700 ml  Net    400 ml    Physical Exam:  General: elderly obese male lying in bed  in no acute distress. HEENT: no pallor, no icterus, moist oral mucosa, no JVD, no lymphadenopathy Heart: Normal  s1 &s2  Regular rate and rhythm, without murmurs, rubs, gallops. Lungs: Clear to auscultation bilaterally. No added sounds Abdomen: Soft, nontender, nondistended, positive bowel sounds. Extremities: No clubbing cyanosis or edema with positive pedal pulses. Neuro: Alert, awake, oriented x3, nonfocal.   Lab Results:  Basic Metabolic Panel:    Component Value Date/Time   NA 139 01/31/2011 0600   K 4.1 01/31/2011 0600   CL 102 01/31/2011 0600   CO2 29 01/31/2011 0600   BUN 13 01/31/2011 0600   CREATININE 0.83 01/31/2011 0600   GLUCOSE 120* 01/31/2011 0600   CALCIUM 10.2 01/31/2011 0600   CBC:    Component Value Date/Time   WBC 15.6* 01/31/2011 0600   HGB 12.4* 01/31/2011 0600   HCT 38.6* 01/31/2011 0600   PLT 213 01/31/2011 0600   MCV 89.8 01/31/2011 0600   NEUTROABS 15.4* 01/30/2011 1859   LYMPHSABS 2.6 01/30/2011 1859   MONOABS 1.4* 01/30/2011 1859   EOSABS 0.3 01/30/2011 1859   BASOSABS 0.0 01/30/2011 1859    No results found for this or any previous visit (from the past 240 hour(s)).  Studies/Results: Dg Chest 2 View  01/30/2011  *RADIOLOGY REPORT*  Clinical Data: Cough  and shortness of breath.  CHEST - 2 VIEW  Comparison: 10/28/2010.  Findings: Trachea is midline.  Heart size is accentuated by technique and low lung volumes.  There is air space consolidation in the right upper lobe with probable atelectasis at both lung bases.  No definite pleural fluid.  IMPRESSION: Right upper lobe pneumonia.  Follow-up to clearing is recommended.  Original Report Authenticated By: Reyes Ivan, M.D.    Medications: Scheduled Meds:   . albuterol  2.5 mg Nebulization Q6H  . albuterol  5 mg Nebulization Once  . amLODipine  10 mg Oral Daily   And  . olmesartan  40 mg Oral Daily  . cholecalciferol  1,000 Units Oral Daily  . clobetasol cream  1 application Topical Daily  . enoxaparin  40 mg Subcutaneous QHS  . Flora-Q  1 capsule Oral Daily  . fluticasone  2 spray Each Nare Daily  . guaiFENesin  600 mg Oral BID  . ipratropium  0.5 mg Nebulization Q6H  . loratadine  10 mg Oral Daily  . montelukast  10 mg Oral QHS  . piperacillin-tazobactam (ZOSYN)  IV  3.375 g Intravenous Once  . piperacillin-tazobactam  3.375 g Intravenous Q8H  . rosuvastatin  10 mg Oral QHS  .  sertraline  100 mg Oral Daily  . Travoprost (BAK Free)  1 drop Both Eyes QHS  . vancomycin  1,000 mg Intravenous Once  . vancomycin  1,000 mg Intravenous Q8H  . vitamin B-12  250 mcg Oral Daily  . vitamin C  1,000 mg Oral Daily  . DISCONTD: amLODipine-olmesartan  1 tablet Oral Daily  . DISCONTD: aztreonam  1 g Intravenous Q8H  . DISCONTD: simvastatin  40 mg Oral QHS   Continuous Infusions:   . sodium chloride 125 mL/hr (01/30/11 2338)   PRN Meds:.acetaminophen, acetaminophen, albuterol, morphine  Assessment: 65 y/o obese AA male with hx fo DM, OSA on CPAP, arthritis, HTN, HL presented with progressive Shortness of breath with cough and subjective fever with failed outpt therapy with avelox for bronchitis by his PCP. patient noted to have leucocytosis with CXR showing a RUL infiltrate.  PLAN:    Healthcare associated bacterial pneumonia   patient started on IV vanco and aztreonam with coverage for HCAP given failed outpt treatment.  aztreonam switched to zosyn this am Monitor wbc and temp curve  low grade temp Cont nebs Needs follow up CXR in 4 weeks to evaluate resolution.  OSA on CPAP  cont home meds  HTN Cont amlod, Benicar  DM Cont SSI   DVT prophylaxis     LOS: 1 day   Justine Cossin 01/31/2011, 6:06 PM

## 2011-02-01 LAB — CBC
HCT: 38.6 % — ABNORMAL LOW (ref 39.0–52.0)
Hemoglobin: 12.3 g/dL — ABNORMAL LOW (ref 13.0–17.0)
MCH: 28.7 pg (ref 26.0–34.0)
MCHC: 31.9 g/dL (ref 30.0–36.0)
MCV: 90.2 fL (ref 78.0–100.0)
Platelets: 225 10*3/uL (ref 150–400)
RBC: 4.28 MIL/uL (ref 4.22–5.81)
RDW: 14.6 % (ref 11.5–15.5)
WBC: 10.4 10*3/uL (ref 4.0–10.5)

## 2011-02-01 LAB — GLUCOSE, CAPILLARY: Glucose-Capillary: 157 mg/dL — ABNORMAL HIGH (ref 70–99)

## 2011-02-01 MED ORDER — PREDNISONE 20 MG PO TABS
40.0000 mg | ORAL_TABLET | Freq: Once | ORAL | Status: AC
  Administered 2011-02-01: 40 mg via ORAL
  Filled 2011-02-01: qty 2

## 2011-02-01 MED ORDER — PREDNISONE 20 MG PO TABS
40.0000 mg | ORAL_TABLET | Freq: Every day | ORAL | Status: DC
  Administered 2011-02-01 – 2011-02-02 (×2): 40 mg via ORAL
  Filled 2011-02-01 (×2): qty 2

## 2011-02-01 MED ORDER — PREDNISONE 20 MG PO TABS: 40.0000 mg | ORAL_TABLET | Freq: Every day | ORAL | Status: AC

## 2011-02-01 MED ORDER — ALBUTEROL SULFATE (5 MG/ML) 0.5% IN NEBU
2.5000 mg | INHALATION_SOLUTION | Freq: Four times a day (QID) | RESPIRATORY_TRACT | Status: DC
  Administered 2011-02-01 – 2011-02-02 (×3): 2.5 mg via RESPIRATORY_TRACT
  Filled 2011-02-01 (×4): qty 0.5

## 2011-02-01 MED ORDER — GUAIFENESIN-DM 100-10 MG/5ML PO SYRP: 5.0000 mL | ORAL_SOLUTION | Freq: Three times a day (TID) | ORAL | Status: AC | PRN

## 2011-02-01 MED ORDER — LEVOFLOXACIN 750 MG PO TABS: 750.0000 mg | ORAL_TABLET | Freq: Every day | ORAL | Status: AC

## 2011-02-01 NOTE — Discharge Instructions (Signed)

## 2011-02-01 NOTE — Discharge Summary (Addendum)
Patient ID: Douglas Hansen MRN: [id number] DOB/AGE: 67-Sep-1947 67 y.o.  Admit date: 01/30/2011 Discharge date: 02/02/2011  Primary Care Physician:  Pearson Grippe, MD  Discharge Diagnoses:    Present on Admission:  1.community acquired Pneumonia likely due to other Gram-negative bacteria 2..Hypoxia associated with mild to moderate asthma exacerbation 3..Diabetes mellitus 4.Hypertension 5.Hyperlipemia 6.OSA on CPAP    Current Discharge Medication List    START taking these medications   Details  guaiFENesin-dextromethorphan (ROBITUSSIN DM) 100-10 MG/5ML syrup Take 5 mLs by mouth 3 (three) times daily as needed for cough. Qty: 118 mL, Refills: 0    levofloxacin (LEVAQUIN) 750 MG tablet Take 1 tablet (750 mg total) by mouth daily. Qty: 5 tablet, Refills: 0    predniSONE (DELTASONE) 20 MG tablet Take 2 tablets (40 mg total) by mouth daily before breakfast. Qty: 4 tablet, Refills: 0      CONTINUE these medications which have NOT CHANGED   Details  albuterol (VENTOLIN HFA) 108 (90 BASE) MCG/ACT inhaler Inhale 2 puffs into the lungs 4 (four) times daily.      amLODipine-olmesartan (AZOR) 10-40 MG per tablet Take 1 tablet by mouth daily.      Ascorbic Acid (VITAMIN C) 1000 MG tablet Take 1,000 mg by mouth daily.      aspirin 325 MG tablet Take 325 mg by mouth daily.      budesonide-formoterol (SYMBICORT) 160-4.5 MCG/ACT inhaler Inhale 2 puffs into the lungs 2 (two) times daily.      Cellulose Carmellose Sodium POWD Apply 1 application topically daily. Apply to feet    Cholecalciferol (VITAMIN D-1000 MAX ST) 1000 UNITS tablet Take 1,000 Units by mouth daily.      clindamycin-benzoyl peroxide (BENZACLIN) gel Apply 1 application topically daily at 3 pm.     clobetasol (TEMOVATE) 0.05 % cream Apply 1 application topically daily.     clotrimazole (LOTRIMIN) 1 % cream Apply 1 application topically daily as needed. For skin condition    fexofenadine (ALLEGRA) 180 MG tablet Take  180 mg by mouth daily.      fluocinolone (VANOS) 0.01 % cream Apply 1 application topically daily.     fluticasone (FLONASE) 50 MCG/ACT nasal spray 2 sprays by Nasal route daily.      ketoconazole (NIZORAL) 2 % cream Apply topically daily.      Ketoconazole-Hydrocortisone (KETOCON PLUS EX) Apply 1 application topically daily.     metFORMIN (GLUCOPHAGE) 500 MG tablet Take 2 tablets by mouth Twice daily.    montelukast (SINGULAIR) 10 MG tablet Take 10 mg by mouth at bedtime.      Probiotic Product (ALIGN) 4 MG CAPS Take 1 capsule by mouth daily.      sertraline (ZOLOFT) 100 MG tablet Take 100 mg by mouth daily.      simvastatin (ZOCOR) 40 MG tablet Take 40 mg by mouth at bedtime.      Skin Protectants, Misc. (DERMACERIN) CREA Apply 1 application topically daily as needed.     travoprost, benzalkonium, (TRAVATAN Z) 0.004 % ophthalmic solution Place 1 drop into both eyes at bedtime.      triamcinolone (KENALOG) 0.1 % cream Apply topically 2 (two) times daily.      vitamin B-12 (CYANOCOBALAMIN) 250 MCG tablet Take 250 mcg by mouth daily.          Disposition and Follow-up:  Follow up with PCP in 1 week. Patient needs follow up CXR in 4 weeks to evaluate resolution of infiltrate.  Consults:   none  Significant Diagnostic  Studies:  Clinical Data: Cough and shortness of breath.  CHEST - 2 VIEW  Comparison: 10/28/2010.  Findings: Trachea is midline. Heart size is accentuated by  technique and low lung volumes. There is air space consolidation  in the right upper lobe with probable atelectasis at both lung  bases. No definite pleural fluid.  IMPRESSION:  Right upper lobe pneumonia. Follow-up to clearing is recommended.    Brief H and P: For complete details please refer to admission H and P, but in brief 65 y/o obese AA male with hx of DM, OSA on CPAP, arthritis, HTN, HL presented with progressive Shortness of breath with cough and subjective fever with failed outpt therapy  with avelox for bronchitis by his PCP. patient noted to have leucocytosis, hypoxia and tachycardia with CXR showing a RUL infiltrate. Patient admitted for SIRS associated withCAP.       Physical Exam on Discharge:  Filed Vitals:   01/31/11 2126 02/01/11 0253 02/01/11 0500 02/01/11 1116  BP: 114/68  129/68   Pulse: 96  95   Temp: 97.8 F (36.6 C)  99.2 F (37.3 C)   TempSrc: Oral  Oral   Resp: 20  20   Height:      Weight:      SpO2: 98% 96% 97% 98%     Intake/Output Summary (Last 24 hours) at 02/01/11 1145 Last data filed at 02/01/11 0900  Gross per 24 hour  Intake   3237 ml  Output   2525 ml  Net    712 ml   General: elderly obese male lying in bed in no acute distress.  HEENT: no pallor, no icterus, moist oral mucosa, no JVD, no lymphadenopathy  Heart: Normal s1 &s2 Regular rate and rhythm, without murmurs, rubs, gallops.  Lungs: few scattered wheezes b/l, no ronchi or crackles Abdomen: Soft, nontender, nondistended, positive bowel sounds.  Extremities: No clubbing cyanosis or edema with positive pedal pulses.  Neuro: Alert, awake, oriented x3, nonfocal.    CBC:    Component Value Date/Time   WBC 10.4 02/01/2011 0630   HGB 12.3* 02/01/2011 0630   HCT 38.6* 02/01/2011 0630   PLT 225 02/01/2011 0630   MCV 90.2 02/01/2011 0630   NEUTROABS 15.4* 01/30/2011 1859   LYMPHSABS 2.6 01/30/2011 1859   MONOABS 1.4* 01/30/2011 1859   EOSABS 0.3 01/30/2011 1859   BASOSABS 0.0 01/30/2011 1859    Basic Metabolic Panel:    Component Value Date/Time   NA 139 01/31/2011 0600   K 4.1 01/31/2011 0600   CL 102 01/31/2011 0600   CO2 29 01/31/2011 0600   BUN 13 01/31/2011 0600   CREATININE 0.83 01/31/2011 0600   GLUCOSE 120* 01/31/2011 0600   CALCIUM 10.2 01/31/2011 0600    Hospital Course:   SIRS secondary to Community acquired bacterial pneumonia with failed outpatient treatment Patient noted to be hypoxic with o2 sats dropping to 80s and with leucocytois and  tachycardia. Admission CXR showing a RUL infiltrate . patient started on IV vanco and aztreonam for broad coverage given failed outpt treatment.  aztreonam switched to zosyn next day. Patient also given IV fluids for hydration. Patient symptomatically improving, remains afebrile and leucocytois also resolved. Vitals stable Patient can be discharged on 5 more days of po levofloxacin to complete a 7 days antibiotics course Needs follow up CXR in 4 weeks to evaluate resolution.    Asthma exacerbation  patient noted to be wheezy on admission and slowly improving on nebs.  i will discharge  him on a short course of po prednisone. He has albuterol and symbicort inhalers at home which he should continue.        Time spent on Discharge: 40 minutes  Signed: Eddie North 02/01/2011, 11:45 AM   Patient planned for d/c home on 11/24 but held as he was quite wheezy in the afternoon. ordered scheduled nebs. monitored overnight and is now stable and can be discharged home today.

## 2011-02-02 LAB — GLUCOSE, CAPILLARY: Glucose-Capillary: 142 mg/dL — ABNORMAL HIGH (ref 70–99)

## 2011-02-02 NOTE — Progress Notes (Signed)
Utilization Review Completed.  Douglas Hansen  02/02/2011 

## 2011-02-02 NOTE — Progress Notes (Signed)
ANTIBIOTIC CONSULT NOTE   Pharmacy Consult for vancomycin  Indication: pneumonia  Allergies  Allergen Reactions  . Codeine Other (See Comments)    REACTION: GI upset    Patient Measurements: Height: 5\' 7"  (170.2 cm) Weight: 217 lb 13 oz (98.8 kg) IBW/kg (Calculated) : 66.1   Vital Signs: Temp: 97.4 F (36.3 C) (11/26 0500) Temp src: Oral (11/26 0500) BP: 126/67 mmHg (11/26 0500) Pulse Rate: 89  (11/26 0500)  Labs:  Basename 02/01/11 0630 01/31/11 0600 01/31/11 0010 01/30/11 1859  WBC 10.4 15.6* 18.3* --  HGB 12.3* 12.4* 12.4* --  PLT 225 213 224 --  LABCREA -- -- -- --  CREATININE -- 0.83 0.92 0.82   Estimated Creatinine Clearance: 96.7 ml/min (by C-G formula based on Cr of 0.83).  Microbiology: No results found for this or any previous visit (from the past 720 hour(s)).  Medical History: Past Medical History  Diagnosis Date  . Diabetes mellitus   . Hyperlipemia   . Hypertension   . Arthritis   . OSA on CPAP   . Asthma   . Pneumonia   . COPD (chronic obstructive pulmonary disease)    Medications:  Prescriptions prior to admission  Medication Sig Dispense Refill  . albuterol (VENTOLIN HFA) 108 (90 BASE) MCG/ACT inhaler Inhale 2 puffs into the lungs 4 (four) times daily.        Marland Kitchen amLODipine-olmesartan (AZOR) 10-40 MG per tablet Take 1 tablet by mouth daily.        . Ascorbic Acid (VITAMIN C) 1000 MG tablet Take 1,000 mg by mouth daily.        Marland Kitchen aspirin 325 MG tablet Take 325 mg by mouth daily.        . budesonide-formoterol (SYMBICORT) 160-4.5 MCG/ACT inhaler Inhale 2 puffs into the lungs 2 (two) times daily.        . Cellulose Carmellose Sodium POWD Apply 1 application topically daily. Apply to feet      . Cholecalciferol (VITAMIN D-1000 MAX ST) 1000 UNITS tablet Take 1,000 Units by mouth daily.        . clindamycin-benzoyl peroxide (BENZACLIN) gel Apply 1 application topically daily at 3 pm.       . clobetasol (TEMOVATE) 0.05 % cream Apply 1 application  topically daily.       . clotrimazole (LOTRIMIN) 1 % cream Apply 1 application topically daily as needed. For skin condition      . fexofenadine (ALLEGRA) 180 MG tablet Take 180 mg by mouth daily.        . fluocinolone (VANOS) 0.01 % cream Apply 1 application topically daily.       . fluticasone (FLONASE) 50 MCG/ACT nasal spray 2 sprays by Nasal route daily.        Marland Kitchen ketoconazole (NIZORAL) 2 % cream Apply topically daily.        Marland Kitchen Ketoconazole-Hydrocortisone (KETOCON PLUS EX) Apply 1 application topically daily.       . metFORMIN (GLUCOPHAGE) 500 MG tablet Take 2 tablets by mouth Twice daily.      . montelukast (SINGULAIR) 10 MG tablet Take 10 mg by mouth at bedtime.        . Probiotic Product (ALIGN) 4 MG CAPS Take 1 capsule by mouth daily.        . sertraline (ZOLOFT) 100 MG tablet Take 100 mg by mouth daily.        . simvastatin (ZOCOR) 40 MG tablet Take 40 mg by mouth at bedtime.        Marland Kitchen  Skin Protectants, Misc. (DERMACERIN) CREA Apply 1 application topically daily as needed.       . travoprost, benzalkonium, (TRAVATAN Z) 0.004 % ophthalmic solution Place 1 drop into both eyes at bedtime.        . triamcinolone (KENALOG) 0.1 % cream Apply topically 2 (two) times daily.        . vitamin B-12 (CYANOCOBALAMIN) 250 MCG tablet Take 250 mcg by mouth daily.         Assessment: 65yo male on vancomycin for PNA.  WBC decreasing, afeb.  Goal of Therapy:  Vancomycin trough level 15-20 mcg/ml  Plan:  Check vanc trough today  Woodfin Ganja PharmD 02/02/2011,8:26 AM

## 2011-02-02 NOTE — Progress Notes (Signed)
Subjective: Patient feels much better today. Discharge planned yesterday held as he was quite wheezy requiring nebs. Remains afebrile. No overnight issues.  Objective:  Vital signs in last 24 hours:  Filed Vitals:   02/02/11 0218 02/02/11 0500 02/02/11 0757 02/02/11 0947  BP:  126/67  142/80  Pulse:  89    Temp:  97.4 F (36.3 C)    TempSrc:  Oral    Resp:  18    Height:      Weight:      SpO2: 94% 96% 96%     Intake/Output from previous day:   Intake/Output Summary (Last 24 hours) at 02/02/11 1044 Last data filed at 02/02/11 0600  Gross per 24 hour  Intake    240 ml  Output    500 ml  Net   -260 ml    Physical Exam: General: elderly obese male lying in bed in no acute distress.  HEENT: no pallor, no icterus, moist oral mucosa, no JVD, no lymphadenopathy  Heart: Normal s1 &s2 Regular rate and rhythm, without murmurs, rubs, gallops.  Lungs: few scattered wheeze , no ronchi or crackles Abdomen: Soft, nontender, nondistended, positive bowel sounds.  Extremities: No clubbing cyanosis or edema with positive pedal pulses.  Neuro: Alert, awake, oriented x3, nonfocal.   Lab Results:  Basic Metabolic Panel:    Component Value Date/Time   NA 139 01/31/2011 0600   K 4.1 01/31/2011 0600   CL 102 01/31/2011 0600   CO2 29 01/31/2011 0600   BUN 13 01/31/2011 0600   CREATININE 0.83 01/31/2011 0600   GLUCOSE 120* 01/31/2011 0600   CALCIUM 10.2 01/31/2011 0600   CBC:    Component Value Date/Time   WBC 10.4 02/01/2011 0630   HGB 12.3* 02/01/2011 0630   HCT 38.6* 02/01/2011 0630   PLT 225 02/01/2011 0630   MCV 90.2 02/01/2011 0630   NEUTROABS 15.4* 01/30/2011 1859   LYMPHSABS 2.6 01/30/2011 1859   MONOABS 1.4* 01/30/2011 1859   EOSABS 0.3 01/30/2011 1859   BASOSABS 0.0 01/30/2011 1859    No results found for this or any previous visit (from the past 240 hour(s)).  Studies/Results: No results found.  Medications: Scheduled Meds:   . albuterol  2.5 mg  Nebulization Q6H  . amLODipine  10 mg Oral Daily   And  . olmesartan  40 mg Oral Daily  . cholecalciferol  1,000 Units Oral Daily  . clobetasol cream  1 application Topical Daily  . enoxaparin  40 mg Subcutaneous QHS  . Flora-Q  1 capsule Oral Daily  . fluticasone  2 spray Each Nare Daily  . guaiFENesin  600 mg Oral BID  . ipratropium  0.5 mg Nebulization Q6H  . loratadine  10 mg Oral Daily  . montelukast  10 mg Oral QHS  . piperacillin-tazobactam  3.375 g Intravenous Q8H  . predniSONE  40 mg Oral Once  . predniSONE  40 mg Oral QAC breakfast  . rosuvastatin  10 mg Oral QHS  . sertraline  100 mg Oral Daily  . Travoprost (BAK Free)  1 drop Both Eyes QHS  . vancomycin  1,000 mg Intravenous Q8H  . vitamin B-12  250 mcg Oral Daily  . vitamin C  1,000 mg Oral Daily  . DISCONTD: albuterol  2.5 mg Nebulization Q6H   Continuous Infusions:  PRN Meds:.acetaminophen, acetaminophen, morphine, DISCONTD: albuterol  Assessment/Plan:  Community acquired  bacterial pneumonia  Patient presented with SIRS with RUL pneumonia patient started on IV vanco and  aztreonam with coverage for HCAP given failed outpt treatment.  aztreonam switched to zosyn Monitor wbc and temp curve  Improved with nebs Needs follow up CXR in 4 weeks to evaluate resolution.  Can be discharged on po levaquin to complete a 7 day course  OSA on CPAP  cont home meds   HTN  Cont amlod, Benicar   DM  Cont SSI   DVT prophylaxis   Patient stable to be discharged home today with outpt follow up. D/c summary completed    LOS: 3 days   Douglas Hansen 02/02/2011, 10:44 AM

## 2011-03-12 DIAGNOSIS — IMO0001 Reserved for inherently not codable concepts without codable children: Secondary | ICD-10-CM | POA: Diagnosis not present

## 2011-03-12 DIAGNOSIS — I10 Essential (primary) hypertension: Secondary | ICD-10-CM | POA: Diagnosis not present

## 2011-03-12 DIAGNOSIS — E7800 Pure hypercholesterolemia, unspecified: Secondary | ICD-10-CM | POA: Diagnosis not present

## 2011-03-30 DIAGNOSIS — IMO0002 Reserved for concepts with insufficient information to code with codable children: Secondary | ICD-10-CM | POA: Diagnosis not present

## 2011-03-30 DIAGNOSIS — M25569 Pain in unspecified knee: Secondary | ICD-10-CM | POA: Diagnosis not present

## 2011-03-30 DIAGNOSIS — M1710 Unilateral primary osteoarthritis, unspecified knee: Secondary | ICD-10-CM | POA: Diagnosis not present

## 2011-04-27 DIAGNOSIS — Z79899 Other long term (current) drug therapy: Secondary | ICD-10-CM | POA: Diagnosis not present

## 2011-04-27 DIAGNOSIS — E7800 Pure hypercholesterolemia, unspecified: Secondary | ICD-10-CM | POA: Diagnosis not present

## 2011-04-27 DIAGNOSIS — E119 Type 2 diabetes mellitus without complications: Secondary | ICD-10-CM | POA: Diagnosis not present

## 2011-04-30 DIAGNOSIS — E119 Type 2 diabetes mellitus without complications: Secondary | ICD-10-CM | POA: Diagnosis not present

## 2011-04-30 DIAGNOSIS — I10 Essential (primary) hypertension: Secondary | ICD-10-CM | POA: Diagnosis not present

## 2011-05-04 DIAGNOSIS — M818 Other osteoporosis without current pathological fracture: Secondary | ICD-10-CM | POA: Diagnosis not present

## 2011-05-04 DIAGNOSIS — I10 Essential (primary) hypertension: Secondary | ICD-10-CM | POA: Diagnosis not present

## 2011-05-04 DIAGNOSIS — E210 Primary hyperparathyroidism: Secondary | ICD-10-CM | POA: Diagnosis not present

## 2011-05-04 DIAGNOSIS — E559 Vitamin D deficiency, unspecified: Secondary | ICD-10-CM | POA: Diagnosis not present

## 2011-05-11 DIAGNOSIS — N3941 Urge incontinence: Secondary | ICD-10-CM | POA: Diagnosis not present

## 2011-05-11 DIAGNOSIS — R3915 Urgency of urination: Secondary | ICD-10-CM | POA: Diagnosis not present

## 2011-05-11 DIAGNOSIS — R350 Frequency of micturition: Secondary | ICD-10-CM | POA: Diagnosis not present

## 2011-06-09 DIAGNOSIS — IMO0001 Reserved for inherently not codable concepts without codable children: Secondary | ICD-10-CM | POA: Diagnosis not present

## 2011-06-09 DIAGNOSIS — E7800 Pure hypercholesterolemia, unspecified: Secondary | ICD-10-CM | POA: Diagnosis not present

## 2011-06-11 DIAGNOSIS — J209 Acute bronchitis, unspecified: Secondary | ICD-10-CM | POA: Diagnosis not present

## 2011-06-11 DIAGNOSIS — I10 Essential (primary) hypertension: Secondary | ICD-10-CM | POA: Diagnosis not present

## 2011-06-11 DIAGNOSIS — IMO0001 Reserved for inherently not codable concepts without codable children: Secondary | ICD-10-CM | POA: Diagnosis not present

## 2011-06-11 DIAGNOSIS — E7800 Pure hypercholesterolemia, unspecified: Secondary | ICD-10-CM | POA: Diagnosis not present

## 2011-08-26 DIAGNOSIS — M810 Age-related osteoporosis without current pathological fracture: Secondary | ICD-10-CM | POA: Diagnosis not present

## 2011-09-09 DIAGNOSIS — E7800 Pure hypercholesterolemia, unspecified: Secondary | ICD-10-CM | POA: Diagnosis not present

## 2011-09-09 DIAGNOSIS — IMO0001 Reserved for inherently not codable concepts without codable children: Secondary | ICD-10-CM | POA: Diagnosis not present

## 2011-09-14 DIAGNOSIS — IMO0001 Reserved for inherently not codable concepts without codable children: Secondary | ICD-10-CM | POA: Diagnosis not present

## 2011-09-14 DIAGNOSIS — E7800 Pure hypercholesterolemia, unspecified: Secondary | ICD-10-CM | POA: Diagnosis not present

## 2011-09-14 DIAGNOSIS — R0609 Other forms of dyspnea: Secondary | ICD-10-CM | POA: Diagnosis not present

## 2011-09-14 DIAGNOSIS — I10 Essential (primary) hypertension: Secondary | ICD-10-CM | POA: Diagnosis not present

## 2011-09-14 DIAGNOSIS — R0989 Other specified symptoms and signs involving the circulatory and respiratory systems: Secondary | ICD-10-CM | POA: Diagnosis not present

## 2011-09-15 DIAGNOSIS — E7800 Pure hypercholesterolemia, unspecified: Secondary | ICD-10-CM | POA: Diagnosis not present

## 2011-09-15 DIAGNOSIS — R0609 Other forms of dyspnea: Secondary | ICD-10-CM | POA: Diagnosis not present

## 2011-09-15 DIAGNOSIS — IMO0001 Reserved for inherently not codable concepts without codable children: Secondary | ICD-10-CM | POA: Diagnosis not present

## 2011-09-15 DIAGNOSIS — R0989 Other specified symptoms and signs involving the circulatory and respiratory systems: Secondary | ICD-10-CM | POA: Diagnosis not present

## 2011-09-18 DIAGNOSIS — E875 Hyperkalemia: Secondary | ICD-10-CM | POA: Diagnosis not present

## 2011-09-18 DIAGNOSIS — E119 Type 2 diabetes mellitus without complications: Secondary | ICD-10-CM | POA: Diagnosis not present

## 2011-09-18 DIAGNOSIS — E7800 Pure hypercholesterolemia, unspecified: Secondary | ICD-10-CM | POA: Diagnosis not present

## 2011-09-18 DIAGNOSIS — I10 Essential (primary) hypertension: Secondary | ICD-10-CM | POA: Diagnosis not present

## 2011-12-07 DIAGNOSIS — K5289 Other specified noninfective gastroenteritis and colitis: Secondary | ICD-10-CM | POA: Diagnosis not present

## 2011-12-11 DIAGNOSIS — E7800 Pure hypercholesterolemia, unspecified: Secondary | ICD-10-CM | POA: Diagnosis not present

## 2011-12-11 DIAGNOSIS — IMO0001 Reserved for inherently not codable concepts without codable children: Secondary | ICD-10-CM | POA: Diagnosis not present

## 2011-12-11 DIAGNOSIS — Z125 Encounter for screening for malignant neoplasm of prostate: Secondary | ICD-10-CM | POA: Diagnosis not present

## 2011-12-15 DIAGNOSIS — Z23 Encounter for immunization: Secondary | ICD-10-CM | POA: Diagnosis not present

## 2011-12-15 DIAGNOSIS — R32 Unspecified urinary incontinence: Secondary | ICD-10-CM | POA: Diagnosis not present

## 2011-12-15 DIAGNOSIS — I10 Essential (primary) hypertension: Secondary | ICD-10-CM | POA: Diagnosis not present

## 2011-12-15 DIAGNOSIS — IMO0001 Reserved for inherently not codable concepts without codable children: Secondary | ICD-10-CM | POA: Diagnosis not present

## 2011-12-15 DIAGNOSIS — E7800 Pure hypercholesterolemia, unspecified: Secondary | ICD-10-CM | POA: Diagnosis not present

## 2012-01-18 DIAGNOSIS — J45901 Unspecified asthma with (acute) exacerbation: Secondary | ICD-10-CM | POA: Diagnosis not present

## 2012-02-06 ENCOUNTER — Emergency Department (HOSPITAL_COMMUNITY): Payer: Medicare Other | Attending: Physician Assistant

## 2012-02-06 ENCOUNTER — Encounter (HOSPITAL_COMMUNITY): Payer: Self-pay | Admitting: Emergency Medicine

## 2012-02-06 ENCOUNTER — Emergency Department (INDEPENDENT_AMBULATORY_CARE_PROVIDER_SITE_OTHER)
Admission: EM | Admit: 2012-02-06 | Discharge: 2012-02-06 | Disposition: A | Discharge status: Home / Self Care | Payer: Medicare Other | Source: Home / Self Care | Attending: Emergency Medicine | Admitting: Emergency Medicine

## 2012-02-06 ENCOUNTER — Ambulatory Visit (HOSPITAL_COMMUNITY)
Admission: AD | Admit: 2012-02-06 | Payer: Medicare Other | Source: Other Acute Inpatient Hospital | Admitting: Emergency Medicine

## 2012-02-06 DIAGNOSIS — R109 Unspecified abdominal pain: Secondary | ICD-10-CM | POA: Diagnosis not present

## 2012-02-06 DIAGNOSIS — R059 Cough, unspecified: Secondary | ICD-10-CM | POA: Diagnosis not present

## 2012-02-06 DIAGNOSIS — R1110 Vomiting, unspecified: Secondary | ICD-10-CM | POA: Diagnosis not present

## 2012-02-06 DIAGNOSIS — J984 Other disorders of lung: Secondary | ICD-10-CM | POA: Diagnosis not present

## 2012-02-06 DIAGNOSIS — R05 Cough: Secondary | ICD-10-CM | POA: Insufficient documentation

## 2012-02-06 LAB — POCT I-STAT, CHEM 8
BUN: 13 mg/dL (ref 6–23)
Calcium, Ion: 1.46 mmol/L — ABNORMAL HIGH (ref 1.13–1.30)
Chloride: 106 mEq/L (ref 96–112)
Creatinine, Ser: 1 mg/dL (ref 0.50–1.35)
Glucose, Bld: 91 mg/dL (ref 70–99)
HCT: 45 % (ref 39.0–52.0)
Hemoglobin: 15.3 g/dL (ref 13.0–17.0)
Potassium: 4.6 mEq/L (ref 3.5–5.1)
Sodium: 146 mEq/L — ABNORMAL HIGH (ref 135–145)
TCO2: 29 mmol/L (ref 0–100)

## 2012-02-06 NOTE — ED Notes (Signed)
Pt c/o fatigue, vomiting yesterday and was unable to keep anything down, night sweats and fatigue. Pt states that he has a pain going across lower abdomen. Symptoms started on Friday. Pt has tried otc meds with no relief and denies any other symptoms.

## 2012-02-06 NOTE — Discharge Instructions (Signed)
Hypercalcemia Hypercalcemia means the calcium in your blood is too high. A level above 10.5 milligrams per deciliter of blood is considered high. Calcium in our blood is important for the control of many things, such as:  Blood clotting.  Conducting of nerve impulses.  Muscle contraction.  Maintaining teeth and bone health.  Other body functions. In the bloodstream, calcium maintains a constant balance with another mineral, phosphate. Calcium is absorbed into the body through the small intestine. This is helped by Vitamin D. Calcium levels are maintained mostly by vitamin D and a hormone (parathyroid hormone). But the kidneys also help. Hypercalcemia can happen when the concentration of calcium is too high for the kidneys to maintain balance. The body maintains a balance between the calcium we eat and the calcium already in our body. If calcium intake is increased or we cannot use calcium properly, there may be problems. Some common sources of calcium are:   Dairy products.  Nuts.  Eggs.  Whole grains.  Legumes.  Green leafy vegetables. CAUSES There are many causes of this condition, but some common ones are:  Hyperparathyroidism. This is an over activity of the parathyroid gland.  Cancers of the breast, kidney, lung, head and neck are common causes of calcium increases.  Medications that cause you to urinate more often (diuretics), nausea, vomiting and diarrhea also increase the calcium in the blood.  Overuse of calcium-containing antacids. SYMPTOMS  Many patients with mild hypercalcemia have no symptoms. For those with symptoms common problems include:  Loss of appetite.  Constipation.  Increased thirst.  Heart rhythm changes.  Abnormal thinking.  Nausea.  Abdominal pain.  Kidney stones.  Mood swings.  Coma and death when severe.  Vomiting.  Increased urination.  High blood pressure.  Confusion. DIAGNOSIS   Your caregiver will do a medical history  and perform a physical exam on you.  Calcium and parathyroid hormone (PTH) may be measured with a blood test. TREATMENT   The treatment depends on the calcium level and what is causing the higher level. Hypercalcemia can be lifethreatening. Fast lowering of the calcium level may be necessary.  With normal kidney function, fluids can be given by vein to clear the excess calcium. Hemodialysis works well to reduce dangerous calcium levels if there is poor kidney function. This is a procedure in which a machine is used to filter out unwanted substances. The blood is then returned to the body.  Drugs, such as diuretics, can be given after adequate fluid intake is established. These medications help the kidneys get rid of extra calcium. Drugs that lessen (inhibit) bone loss are helpful in gaining long-term control. Phosphate pills help lower high calcium levels caused by a low supply of phosphate. Anti-inflammatory agents such as steroids are helpful with some cancers and toxic levels of vitamin D.  Treatment of the underlying cause of the hypercalcemia will also correct the imbalance. Hyperparathyroidism is usually treated by surgical removal of one or more of the parathyroid glands and any tissue, other than the glands themselves, that is producing too much hormone.  The hypercalcemia caused by cancer is difficult to treat without controlling the cancer. Symptoms can be improved with fluids and drug therapy as outlined above. PROGNOSIS   Surgery to remove the parathyroid glands is usually successful. This also depends on the amount of damage to the kidneys and whether or not it can be treated.  Mild hypercalcemia can be controlled with good fluid intake and the use of effective medications.    Hypercalcemia often develops as a late complication of cancer. The expected outlook is poor without effective anticancer therapy. PREVENTION   If you are at risk for developing hypercalcemia, be familiar with  early symptoms. Report these to your caregiver.  Good fluid intake (up to four quarts of liquid a day if possible) is helpful.  Try to control nausea and vomiting, and treat fevers to avoid dehydration.  Lowering the amount of calcium in your diet is not necessary. High blood calcium reduces absorption of calcium in the intestine.  Stay as active as possible. SEEK IMMEDIATE MEDICAL CARE IF:   You develop chest pain, sweating, or shortness of breath.  You get confused, feel faint or pass out.  You develop severe nausea and vomiting. MAKE SURE YOU:   Understand these instructions.  Will watch your condition.  Will get help right away if you are not doing well or get worse. Document Released: 05/09/2004 Document Revised: 05/18/2011 Document Reviewed: 02/18/2010 [employer] Patient Information 2013 [employer].  

## 2012-02-06 NOTE — ED Provider Notes (Signed)
History     CSN: [id number]  Arrival date & time 02/06/12  1437   First MD Initiated Contact with Patient 02/06/12 1657      Chief Complaint  Patient presents with  . Emesis    vomiting x yesterday was unable to keep anything down. appetite is normal today no vomiting episodes  . Abdominal Pain     abd pain across lower abdomen    (Consider location/radiation/quality/duration/timing/severity/associated sxs/prior treatment) Patient is a 66 y.o. male presenting with vomiting. The history is provided by the patient. No language interpreter was used.  Emesis  This is a new problem. The current episode started yesterday. The problem occurs 2 to 4 times per day. The problem has been gradually worsening. The emesis has an appearance of stomach contents. There has been no fever. Associated symptoms include abdominal pain and cough. Risk factors: diabetes.  family concerned because pt has had increased fatigue over the past few weeks.  Pt vomited yesterday several times.   Family reports pt had pneumonia this time last year and they are concerned that he could have again.  Pt has been coughing for several days  Past Medical History  Diagnosis Date  . Diabetes mellitus   . Hyperlipemia   . Hypertension   . Arthritis   . OSA on CPAP   . Asthma   . Pneumonia   . COPD (chronic obstructive pulmonary disease)     Past Surgical History  Procedure Date  . Cardiac catheterization   . Tonsillectomy     Family History  Problem Relation Age of Onset  . Emphysema Father   . Asthma Father   . Heart disease Father     History  Substance Use Topics  . Smoking status: Former Smoker -- 1.0 packs/day for 20 years    Types: Cigarettes    Quit date: 03/09/1985  . Smokeless tobacco: Never Used  . Alcohol Use: No      Review of Systems  Respiratory: Positive for cough.   Gastrointestinal: Positive for vomiting and abdominal pain.  All other systems reviewed and are  negative.    Allergies  Codeine  Home Medications   Current Outpatient Rx  Name  Route  Sig  Dispense  Refill  . ALBUTEROL SULFATE HFA 108 (90 BASE) MCG/ACT IN AERS   Inhalation   Inhale 2 puffs into the lungs 4 (four) times daily.           Marland Kitchen AMLODIPINE-OLMESARTAN 10-40 MG PO TABS   Oral   Take 1 tablet by mouth daily.           Marland Kitchen VITAMIN C 1000 MG PO TABS   Oral   Take 1,000 mg by mouth daily.           . ASPIRIN 325 MG PO TABS   Oral   Take 325 mg by mouth daily.           . BUDESONIDE-FORMOTEROL FUMARATE 160-4.5 MCG/ACT IN AERO   Inhalation   Inhale 2 puffs into the lungs 2 (two) times daily.           . CELLULOSE CARMELLOSE SODIUM POWD   Topical   Apply 1 application topically daily. Apply to feet         . CHOLECALCIFEROL 1000 UNITS PO TABS   Oral   Take 1,000 Units by mouth daily.           Marland Kitchen CLINDAMYCIN PHOS-BENZOYL PEROX 1-5 % EX GEL   Topical  Apply 1 application topically daily at 3 pm.          . CLOBETASOL PROPIONATE 0.05 % EX CREA   Topical   Apply 1 application topically daily.          Marland Kitchen CLOTRIMAZOLE 1 % EX CREA   Topical   Apply 1 application topically daily as needed. For skin condition         . FEXOFENADINE HCL 180 MG PO TABS   Oral   Take 180 mg by mouth daily.           Marland Kitchen FLUOCINOLONE ACETONIDE 0.01 % EX CREA   Topical   Apply 1 application topically daily.          Marland Kitchen FLUTICASONE PROPIONATE 50 MCG/ACT NA SUSP   Nasal   2 sprays by Nasal route daily.           Marland Kitchen KETOCONAZOLE 2 % EX CREA   Topical   Apply topically daily.           Marland Kitchen KETOCON PLUS EX   Topical   Apply 1 application topically daily.          Marland Kitchen METFORMIN HCL 500 MG PO TABS   Oral   Take 2 tablets by mouth Twice daily.         Marland Kitchen MONTELUKAST SODIUM 10 MG PO TABS   Oral   Take 10 mg by mouth at bedtime.           Marland Kitchen ALIGN 4 MG PO CAPS   Oral   Take 1 capsule by mouth daily.           . SERTRALINE HCL 100 MG PO TABS    Oral   Take 100 mg by mouth daily.           Marland Kitchen SIMVASTATIN 40 MG PO TABS   Oral   Take 40 mg by mouth at bedtime.           . DERMACERIN EX CREA   Topical   Apply 1 application topically daily as needed.          . TRAVOPROST 0.004 % OP SOLN   Both Eyes   Place 1 drop into both eyes at bedtime.           . TRIAMCINOLONE ACETONIDE 0.1 % EX CREA   Topical   Apply topically 2 (two) times daily.           Marland Kitchen VITAMIN B-12 250 MCG PO TABS   Oral   Take 250 mcg by mouth daily.             BP 136/58  Pulse 96  Temp 98 F (36.7 C) (Oral)  Resp 16  SpO2 96%  Physical Exam  Nursing note and vitals reviewed. Constitutional: He is oriented to person, place, and time. He appears well-developed and well-nourished.  HENT:  Head: Normocephalic and atraumatic.  Left Ear: External ear normal.  Nose: Nose normal.  Mouth/Throat: Oropharynx is clear and moist.  Eyes: Conjunctivae normal and EOM are normal. Pupils are equal, round, and reactive to light.  Neck: Normal range of motion. Neck supple.  Cardiovascular: Normal rate and regular rhythm.   Pulmonary/Chest: Effort normal and breath sounds normal.  Abdominal: Soft.  Musculoskeletal: Normal range of motion.  Neurological: He is alert and oriented to person, place, and time. He has normal reflexes.  Psychiatric: He has a normal mood and affect.    ED Course  Procedures (  including critical care time)  Labs Reviewed - No data to display Dg Chest 2 View  02/06/2012  *RADIOLOGY REPORT*  Clinical Data: Cough, vomiting and abdominal pain.  CHEST - 2 VIEW  Comparison: 01/30/2011  Findings: Stable areas of parenchymal scarring bilaterally.  Stable chronic lung disease.  No evidence of pulmonary infiltrate, edema or pleural effusion.  Heart size is within normal limits.  Bony structures show osteopenia.  IMPRESSION: No active disease.  Stable chronic lung disease and bilateral parenchymal scarring.   Original Report  Authenticated By: Irish Lack, M.D.      1. Hypercalcemia       MDM  Pt advised to see Dr. Selena Batten for recheck on Monday.  Go to ED if symptoms worsen or change        Lonia Skinner [place], [place] 02/06/12 1913  Lonia Skinner [place], [place] 02/06/12 [phone]

## 2012-02-06 NOTE — ED Notes (Signed)
Spoke with Dorene Grebe in radiology and made her aware patient was on the way

## 2012-02-06 NOTE — ED Notes (Signed)
Will obtain specimen for I-stat 8 when patient returns from radiology

## 2012-02-06 NOTE — ED Provider Notes (Signed)
Medical screening examination/treatment/procedure(s) were performed by non-physician practitioner and as supervising physician I was immediately available for consultation/collaboration.  Leslee Home, M.D.   Reuben Likes, MD 02/06/12 2035

## 2012-02-09 DIAGNOSIS — E119 Type 2 diabetes mellitus without complications: Secondary | ICD-10-CM | POA: Diagnosis not present

## 2012-02-09 DIAGNOSIS — R32 Unspecified urinary incontinence: Secondary | ICD-10-CM | POA: Diagnosis not present

## 2012-02-17 DIAGNOSIS — J45909 Unspecified asthma, uncomplicated: Secondary | ICD-10-CM | POA: Diagnosis not present

## 2012-03-16 DIAGNOSIS — E119 Type 2 diabetes mellitus without complications: Secondary | ICD-10-CM | POA: Diagnosis not present

## 2012-03-16 DIAGNOSIS — I10 Essential (primary) hypertension: Secondary | ICD-10-CM | POA: Diagnosis not present

## 2012-03-16 DIAGNOSIS — Z0000 Encounter for general adult medical examination without abnormal findings: Secondary | ICD-10-CM | POA: Diagnosis not present

## 2012-03-17 DIAGNOSIS — I10 Essential (primary) hypertension: Secondary | ICD-10-CM | POA: Diagnosis not present

## 2012-03-17 DIAGNOSIS — IMO0001 Reserved for inherently not codable concepts without codable children: Secondary | ICD-10-CM | POA: Diagnosis not present

## 2012-03-17 DIAGNOSIS — R32 Unspecified urinary incontinence: Secondary | ICD-10-CM | POA: Diagnosis not present

## 2012-03-17 DIAGNOSIS — E7800 Pure hypercholesterolemia, unspecified: Secondary | ICD-10-CM | POA: Diagnosis not present

## 2012-03-31 ENCOUNTER — Other Ambulatory Visit: Payer: Self-pay | Admitting: Internal Medicine

## 2012-03-31 DIAGNOSIS — I10 Essential (primary) hypertension: Secondary | ICD-10-CM | POA: Diagnosis not present

## 2012-03-31 DIAGNOSIS — R0602 Shortness of breath: Secondary | ICD-10-CM | POA: Diagnosis not present

## 2012-03-31 DIAGNOSIS — R0989 Other specified symptoms and signs involving the circulatory and respiratory systems: Secondary | ICD-10-CM | POA: Diagnosis not present

## 2012-03-31 DIAGNOSIS — E7800 Pure hypercholesterolemia, unspecified: Secondary | ICD-10-CM | POA: Diagnosis not present

## 2012-03-31 DIAGNOSIS — IMO0001 Reserved for inherently not codable concepts without codable children: Secondary | ICD-10-CM | POA: Diagnosis not present

## 2012-03-31 DIAGNOSIS — R519 Headache, unspecified: Secondary | ICD-10-CM

## 2012-03-31 DIAGNOSIS — R42 Dizziness and giddiness: Secondary | ICD-10-CM

## 2012-04-04 ENCOUNTER — Ambulatory Visit
Admission: RE | Admit: 2012-04-04 | Discharge: 2012-04-04 | Disposition: A | Discharge status: Home / Self Care | Payer: Medicare Other | Source: Ambulatory Visit | Attending: Internal Medicine | Admitting: Internal Medicine

## 2012-04-04 DIAGNOSIS — R42 Dizziness and giddiness: Secondary | ICD-10-CM

## 2012-04-04 DIAGNOSIS — R519 Headache, unspecified: Secondary | ICD-10-CM

## 2012-04-04 DIAGNOSIS — R51 Headache: Secondary | ICD-10-CM | POA: Diagnosis not present

## 2012-04-14 DIAGNOSIS — E782 Mixed hyperlipidemia: Secondary | ICD-10-CM | POA: Diagnosis not present

## 2012-04-14 DIAGNOSIS — E119 Type 2 diabetes mellitus without complications: Secondary | ICD-10-CM | POA: Diagnosis not present

## 2012-04-14 DIAGNOSIS — R0602 Shortness of breath: Secondary | ICD-10-CM | POA: Diagnosis not present

## 2012-04-14 DIAGNOSIS — I10 Essential (primary) hypertension: Secondary | ICD-10-CM | POA: Diagnosis not present

## 2012-04-29 DIAGNOSIS — R0602 Shortness of breath: Secondary | ICD-10-CM | POA: Diagnosis not present

## 2012-04-29 DIAGNOSIS — R9431 Abnormal electrocardiogram [ECG] [EKG]: Secondary | ICD-10-CM | POA: Diagnosis not present

## 2012-04-29 DIAGNOSIS — E119 Type 2 diabetes mellitus without complications: Secondary | ICD-10-CM | POA: Diagnosis not present

## 2012-05-02 DIAGNOSIS — I10 Essential (primary) hypertension: Secondary | ICD-10-CM | POA: Diagnosis not present

## 2012-05-02 DIAGNOSIS — I70219 Atherosclerosis of native arteries of extremities with intermittent claudication, unspecified extremity: Secondary | ICD-10-CM | POA: Diagnosis not present

## 2012-05-02 DIAGNOSIS — E7800 Pure hypercholesterolemia, unspecified: Secondary | ICD-10-CM | POA: Diagnosis not present

## 2012-05-02 DIAGNOSIS — IMO0001 Reserved for inherently not codable concepts without codable children: Secondary | ICD-10-CM | POA: Diagnosis not present

## 2012-05-03 DIAGNOSIS — E210 Primary hyperparathyroidism: Secondary | ICD-10-CM | POA: Diagnosis not present

## 2012-05-03 DIAGNOSIS — I10 Essential (primary) hypertension: Secondary | ICD-10-CM | POA: Diagnosis not present

## 2012-05-03 DIAGNOSIS — M818 Other osteoporosis without current pathological fracture: Secondary | ICD-10-CM | POA: Diagnosis not present

## 2012-05-03 DIAGNOSIS — E559 Vitamin D deficiency, unspecified: Secondary | ICD-10-CM | POA: Diagnosis not present

## 2012-05-12 DIAGNOSIS — R9431 Abnormal electrocardiogram [ECG] [EKG]: Secondary | ICD-10-CM | POA: Diagnosis not present

## 2012-05-12 DIAGNOSIS — R0602 Shortness of breath: Secondary | ICD-10-CM | POA: Diagnosis not present

## 2012-05-12 DIAGNOSIS — J069 Acute upper respiratory infection, unspecified: Secondary | ICD-10-CM | POA: Diagnosis not present

## 2012-05-20 DIAGNOSIS — E782 Mixed hyperlipidemia: Secondary | ICD-10-CM | POA: Diagnosis not present

## 2012-05-20 DIAGNOSIS — R0602 Shortness of breath: Secondary | ICD-10-CM | POA: Diagnosis not present

## 2012-05-20 DIAGNOSIS — I728 Aneurysm of other specified arteries: Secondary | ICD-10-CM | POA: Diagnosis not present

## 2012-05-20 DIAGNOSIS — I10 Essential (primary) hypertension: Secondary | ICD-10-CM | POA: Diagnosis not present

## 2012-06-02 DIAGNOSIS — R059 Cough, unspecified: Secondary | ICD-10-CM | POA: Diagnosis not present

## 2012-06-02 DIAGNOSIS — R197 Diarrhea, unspecified: Secondary | ICD-10-CM | POA: Diagnosis not present

## 2012-06-02 DIAGNOSIS — R05 Cough: Secondary | ICD-10-CM | POA: Diagnosis not present

## 2012-06-21 DIAGNOSIS — E7800 Pure hypercholesterolemia, unspecified: Secondary | ICD-10-CM | POA: Diagnosis not present

## 2012-06-21 DIAGNOSIS — I10 Essential (primary) hypertension: Secondary | ICD-10-CM | POA: Diagnosis not present

## 2012-06-21 DIAGNOSIS — IMO0001 Reserved for inherently not codable concepts without codable children: Secondary | ICD-10-CM | POA: Diagnosis not present

## 2012-06-22 DIAGNOSIS — J45909 Unspecified asthma, uncomplicated: Secondary | ICD-10-CM | POA: Diagnosis not present

## 2012-06-23 DIAGNOSIS — E7800 Pure hypercholesterolemia, unspecified: Secondary | ICD-10-CM | POA: Diagnosis not present

## 2012-06-23 DIAGNOSIS — E559 Vitamin D deficiency, unspecified: Secondary | ICD-10-CM | POA: Diagnosis not present

## 2012-06-23 DIAGNOSIS — E291 Testicular hypofunction: Secondary | ICD-10-CM | POA: Diagnosis not present

## 2012-06-23 DIAGNOSIS — E210 Primary hyperparathyroidism: Secondary | ICD-10-CM | POA: Diagnosis not present

## 2012-06-23 DIAGNOSIS — IMO0001 Reserved for inherently not codable concepts without codable children: Secondary | ICD-10-CM | POA: Diagnosis not present

## 2012-08-08 ENCOUNTER — Other Ambulatory Visit: Payer: Self-pay | Admitting: Internal Medicine

## 2012-08-08 DIAGNOSIS — E7800 Pure hypercholesterolemia, unspecified: Secondary | ICD-10-CM | POA: Diagnosis not present

## 2012-08-08 DIAGNOSIS — I10 Essential (primary) hypertension: Secondary | ICD-10-CM | POA: Diagnosis not present

## 2012-08-08 DIAGNOSIS — N63 Unspecified lump in unspecified breast: Secondary | ICD-10-CM

## 2012-08-08 DIAGNOSIS — IMO0001 Reserved for inherently not codable concepts without codable children: Secondary | ICD-10-CM | POA: Diagnosis not present

## 2012-08-08 DIAGNOSIS — E210 Primary hyperparathyroidism: Secondary | ICD-10-CM | POA: Diagnosis not present

## 2012-08-19 ENCOUNTER — Ambulatory Visit
Admission: RE | Admit: 2012-08-19 | Discharge: 2012-08-19 | Disposition: A | Discharge status: Home / Self Care | Payer: Medicare Other | Source: Ambulatory Visit | Attending: Internal Medicine | Admitting: Internal Medicine

## 2012-08-19 DIAGNOSIS — N63 Unspecified lump in unspecified breast: Secondary | ICD-10-CM

## 2012-08-19 DIAGNOSIS — N62 Hypertrophy of breast: Secondary | ICD-10-CM | POA: Diagnosis not present

## 2012-09-22 DIAGNOSIS — I10 Essential (primary) hypertension: Secondary | ICD-10-CM | POA: Diagnosis not present

## 2012-09-22 DIAGNOSIS — E7800 Pure hypercholesterolemia, unspecified: Secondary | ICD-10-CM | POA: Diagnosis not present

## 2012-09-22 DIAGNOSIS — IMO0001 Reserved for inherently not codable concepts without codable children: Secondary | ICD-10-CM | POA: Diagnosis not present

## 2012-09-28 DIAGNOSIS — IMO0001 Reserved for inherently not codable concepts without codable children: Secondary | ICD-10-CM | POA: Diagnosis not present

## 2012-09-28 DIAGNOSIS — E7800 Pure hypercholesterolemia, unspecified: Secondary | ICD-10-CM | POA: Diagnosis not present

## 2012-11-02 DIAGNOSIS — IMO0001 Reserved for inherently not codable concepts without codable children: Secondary | ICD-10-CM | POA: Diagnosis not present

## 2012-11-02 DIAGNOSIS — I10 Essential (primary) hypertension: Secondary | ICD-10-CM | POA: Diagnosis not present

## 2012-11-08 DIAGNOSIS — I739 Peripheral vascular disease, unspecified: Secondary | ICD-10-CM | POA: Diagnosis not present

## 2012-11-08 DIAGNOSIS — I10 Essential (primary) hypertension: Secondary | ICD-10-CM | POA: Diagnosis not present

## 2012-11-08 DIAGNOSIS — E7800 Pure hypercholesterolemia, unspecified: Secondary | ICD-10-CM | POA: Diagnosis not present

## 2012-11-08 DIAGNOSIS — IMO0001 Reserved for inherently not codable concepts without codable children: Secondary | ICD-10-CM | POA: Diagnosis not present

## 2012-11-22 ENCOUNTER — Other Ambulatory Visit (HOSPITAL_COMMUNITY): Payer: Self-pay | Admitting: Internal Medicine

## 2012-11-22 DIAGNOSIS — R110 Nausea: Secondary | ICD-10-CM | POA: Diagnosis not present

## 2012-11-23 DIAGNOSIS — I728 Aneurysm of other specified arteries: Secondary | ICD-10-CM | POA: Diagnosis not present

## 2012-11-23 DIAGNOSIS — R0602 Shortness of breath: Secondary | ICD-10-CM | POA: Diagnosis not present

## 2012-11-23 DIAGNOSIS — E119 Type 2 diabetes mellitus without complications: Secondary | ICD-10-CM | POA: Diagnosis not present

## 2012-11-23 DIAGNOSIS — E782 Mixed hyperlipidemia: Secondary | ICD-10-CM | POA: Diagnosis not present

## 2012-12-05 ENCOUNTER — Encounter (HOSPITAL_COMMUNITY)
Admission: RE | Admit: 2012-12-05 | Discharge: 2012-12-05 | Disposition: A | Discharge status: Home / Self Care | Payer: Medicare Other | Source: Ambulatory Visit | Attending: Internal Medicine | Admitting: Internal Medicine

## 2012-12-05 DIAGNOSIS — R141 Gas pain: Secondary | ICD-10-CM | POA: Diagnosis not present

## 2012-12-05 DIAGNOSIS — R142 Eructation: Secondary | ICD-10-CM | POA: Insufficient documentation

## 2012-12-05 DIAGNOSIS — R110 Nausea: Secondary | ICD-10-CM

## 2012-12-05 DIAGNOSIS — R143 Flatulence: Secondary | ICD-10-CM | POA: Diagnosis not present

## 2012-12-05 MED ORDER — TECHNETIUM TC 99M SULFUR COLLOID
2.0000 | Freq: Once | INTRAVENOUS | Status: AC | PRN
  Administered 2012-12-05: 2 via INTRAVENOUS

## 2012-12-07 DIAGNOSIS — Z01810 Encounter for preprocedural cardiovascular examination: Secondary | ICD-10-CM | POA: Diagnosis not present

## 2012-12-07 DIAGNOSIS — R0602 Shortness of breath: Secondary | ICD-10-CM | POA: Diagnosis not present

## 2012-12-08 ENCOUNTER — Encounter (HOSPITAL_COMMUNITY): Payer: Self-pay | Admitting: Respiratory Therapy

## 2012-12-13 DIAGNOSIS — IMO0001 Reserved for inherently not codable concepts without codable children: Secondary | ICD-10-CM | POA: Diagnosis not present

## 2012-12-13 DIAGNOSIS — E559 Vitamin D deficiency, unspecified: Secondary | ICD-10-CM | POA: Diagnosis not present

## 2012-12-13 DIAGNOSIS — R110 Nausea: Secondary | ICD-10-CM | POA: Diagnosis not present

## 2012-12-13 DIAGNOSIS — E7800 Pure hypercholesterolemia, unspecified: Secondary | ICD-10-CM | POA: Diagnosis not present

## 2012-12-26 DIAGNOSIS — IMO0001 Reserved for inherently not codable concepts without codable children: Secondary | ICD-10-CM | POA: Diagnosis not present

## 2012-12-26 DIAGNOSIS — E7800 Pure hypercholesterolemia, unspecified: Secondary | ICD-10-CM | POA: Diagnosis not present

## 2012-12-27 DIAGNOSIS — I10 Essential (primary) hypertension: Secondary | ICD-10-CM | POA: Diagnosis not present

## 2012-12-27 DIAGNOSIS — E7800 Pure hypercholesterolemia, unspecified: Secondary | ICD-10-CM | POA: Diagnosis not present

## 2012-12-27 DIAGNOSIS — IMO0001 Reserved for inherently not codable concepts without codable children: Secondary | ICD-10-CM | POA: Diagnosis not present

## 2012-12-28 DIAGNOSIS — E7800 Pure hypercholesterolemia, unspecified: Secondary | ICD-10-CM | POA: Diagnosis not present

## 2012-12-28 DIAGNOSIS — M818 Other osteoporosis without current pathological fracture: Secondary | ICD-10-CM | POA: Diagnosis not present

## 2012-12-28 DIAGNOSIS — Z006 Encounter for examination for normal comparison and control in clinical research program: Secondary | ICD-10-CM | POA: Diagnosis not present

## 2012-12-28 DIAGNOSIS — I10 Essential (primary) hypertension: Secondary | ICD-10-CM | POA: Diagnosis not present

## 2013-01-03 ENCOUNTER — Encounter (HOSPITAL_COMMUNITY): Admission: RE | Payer: Self-pay | Source: Ambulatory Visit

## 2013-01-03 ENCOUNTER — Ambulatory Visit (HOSPITAL_COMMUNITY): Admission: RE | Admit: 2013-01-03 | Payer: Medicare Other | Source: Ambulatory Visit | Admitting: Cardiology

## 2013-01-03 SURGERY — ANGIOGRAM, LOWER EXTREMITY
Anesthesia: LOCAL

## 2013-01-11 DIAGNOSIS — R0602 Shortness of breath: Secondary | ICD-10-CM | POA: Diagnosis not present

## 2013-01-11 DIAGNOSIS — E1159 Type 2 diabetes mellitus with other circulatory complications: Secondary | ICD-10-CM | POA: Diagnosis not present

## 2013-01-11 DIAGNOSIS — E782 Mixed hyperlipidemia: Secondary | ICD-10-CM | POA: Diagnosis not present

## 2013-01-11 DIAGNOSIS — I798 Other disorders of arteries, arterioles and capillaries in diseases classified elsewhere: Secondary | ICD-10-CM | POA: Diagnosis not present

## 2013-01-18 DIAGNOSIS — J45909 Unspecified asthma, uncomplicated: Secondary | ICD-10-CM | POA: Diagnosis not present

## 2013-01-18 DIAGNOSIS — J309 Allergic rhinitis, unspecified: Secondary | ICD-10-CM | POA: Diagnosis not present

## 2013-03-20 DIAGNOSIS — J209 Acute bronchitis, unspecified: Secondary | ICD-10-CM | POA: Diagnosis not present

## 2013-03-29 DIAGNOSIS — E7800 Pure hypercholesterolemia, unspecified: Secondary | ICD-10-CM | POA: Diagnosis not present

## 2013-03-29 DIAGNOSIS — IMO0001 Reserved for inherently not codable concepts without codable children: Secondary | ICD-10-CM | POA: Diagnosis not present

## 2013-03-30 DIAGNOSIS — R059 Cough, unspecified: Secondary | ICD-10-CM | POA: Diagnosis not present

## 2013-03-30 DIAGNOSIS — E119 Type 2 diabetes mellitus without complications: Secondary | ICD-10-CM | POA: Diagnosis not present

## 2013-03-30 DIAGNOSIS — I10 Essential (primary) hypertension: Secondary | ICD-10-CM | POA: Diagnosis not present

## 2013-03-30 DIAGNOSIS — R05 Cough: Secondary | ICD-10-CM | POA: Diagnosis not present

## 2013-03-30 DIAGNOSIS — J984 Other disorders of lung: Secondary | ICD-10-CM | POA: Diagnosis not present

## 2013-03-30 DIAGNOSIS — E7800 Pure hypercholesterolemia, unspecified: Secondary | ICD-10-CM | POA: Diagnosis not present

## 2013-05-03 DIAGNOSIS — E559 Vitamin D deficiency, unspecified: Secondary | ICD-10-CM | POA: Diagnosis not present

## 2013-05-03 DIAGNOSIS — E210 Primary hyperparathyroidism: Secondary | ICD-10-CM | POA: Diagnosis not present

## 2013-05-15 DIAGNOSIS — E559 Vitamin D deficiency, unspecified: Secondary | ICD-10-CM | POA: Diagnosis not present

## 2013-05-15 DIAGNOSIS — I10 Essential (primary) hypertension: Secondary | ICD-10-CM | POA: Diagnosis not present

## 2013-05-15 DIAGNOSIS — IMO0001 Reserved for inherently not codable concepts without codable children: Secondary | ICD-10-CM | POA: Diagnosis not present

## 2013-05-15 DIAGNOSIS — E7800 Pure hypercholesterolemia, unspecified: Secondary | ICD-10-CM | POA: Diagnosis not present

## 2013-05-17 DIAGNOSIS — J45909 Unspecified asthma, uncomplicated: Secondary | ICD-10-CM | POA: Diagnosis not present

## 2013-06-21 DIAGNOSIS — R197 Diarrhea, unspecified: Secondary | ICD-10-CM | POA: Diagnosis not present

## 2013-06-27 DIAGNOSIS — E7800 Pure hypercholesterolemia, unspecified: Secondary | ICD-10-CM | POA: Diagnosis not present

## 2013-06-27 DIAGNOSIS — E119 Type 2 diabetes mellitus without complications: Secondary | ICD-10-CM | POA: Diagnosis not present

## 2013-06-29 DIAGNOSIS — I10 Essential (primary) hypertension: Secondary | ICD-10-CM | POA: Diagnosis not present

## 2013-06-29 DIAGNOSIS — IMO0001 Reserved for inherently not codable concepts without codable children: Secondary | ICD-10-CM | POA: Diagnosis not present

## 2013-06-29 DIAGNOSIS — E7800 Pure hypercholesterolemia, unspecified: Secondary | ICD-10-CM | POA: Diagnosis not present

## 2013-07-12 DIAGNOSIS — I798 Other disorders of arteries, arterioles and capillaries in diseases classified elsewhere: Secondary | ICD-10-CM | POA: Diagnosis not present

## 2013-07-12 DIAGNOSIS — R0602 Shortness of breath: Secondary | ICD-10-CM | POA: Diagnosis not present

## 2013-07-12 DIAGNOSIS — G4733 Obstructive sleep apnea (adult) (pediatric): Secondary | ICD-10-CM | POA: Diagnosis not present

## 2013-07-12 DIAGNOSIS — E1159 Type 2 diabetes mellitus with other circulatory complications: Secondary | ICD-10-CM | POA: Diagnosis not present

## 2013-09-26 DIAGNOSIS — IMO0001 Reserved for inherently not codable concepts without codable children: Secondary | ICD-10-CM | POA: Diagnosis not present

## 2013-09-26 DIAGNOSIS — I10 Essential (primary) hypertension: Secondary | ICD-10-CM | POA: Diagnosis not present

## 2013-09-26 DIAGNOSIS — E7800 Pure hypercholesterolemia, unspecified: Secondary | ICD-10-CM | POA: Diagnosis not present

## 2013-09-26 DIAGNOSIS — E119 Type 2 diabetes mellitus without complications: Secondary | ICD-10-CM | POA: Diagnosis not present

## 2013-11-09 DIAGNOSIS — I10 Essential (primary) hypertension: Secondary | ICD-10-CM | POA: Diagnosis not present

## 2013-11-09 DIAGNOSIS — IMO0001 Reserved for inherently not codable concepts without codable children: Secondary | ICD-10-CM | POA: Diagnosis not present

## 2013-11-09 DIAGNOSIS — Z125 Encounter for screening for malignant neoplasm of prostate: Secondary | ICD-10-CM | POA: Diagnosis not present

## 2013-11-15 DIAGNOSIS — E559 Vitamin D deficiency, unspecified: Secondary | ICD-10-CM | POA: Diagnosis not present

## 2013-11-15 DIAGNOSIS — I10 Essential (primary) hypertension: Secondary | ICD-10-CM | POA: Diagnosis not present

## 2013-11-15 DIAGNOSIS — IMO0001 Reserved for inherently not codable concepts without codable children: Secondary | ICD-10-CM | POA: Diagnosis not present

## 2013-11-15 DIAGNOSIS — E7800 Pure hypercholesterolemia, unspecified: Secondary | ICD-10-CM | POA: Diagnosis not present

## 2013-11-20 DIAGNOSIS — J45909 Unspecified asthma, uncomplicated: Secondary | ICD-10-CM | POA: Diagnosis not present

## 2013-11-22 DIAGNOSIS — I279 Pulmonary heart disease, unspecified: Secondary | ICD-10-CM | POA: Diagnosis not present

## 2013-11-22 DIAGNOSIS — R0602 Shortness of breath: Secondary | ICD-10-CM | POA: Diagnosis not present

## 2013-12-28 DIAGNOSIS — E785 Hyperlipidemia, unspecified: Secondary | ICD-10-CM | POA: Diagnosis not present

## 2013-12-28 DIAGNOSIS — I10 Essential (primary) hypertension: Secondary | ICD-10-CM | POA: Diagnosis not present

## 2013-12-28 DIAGNOSIS — E119 Type 2 diabetes mellitus without complications: Secondary | ICD-10-CM | POA: Diagnosis not present

## 2014-01-16 DIAGNOSIS — R0602 Shortness of breath: Secondary | ICD-10-CM | POA: Diagnosis not present

## 2014-01-16 DIAGNOSIS — G4733 Obstructive sleep apnea (adult) (pediatric): Secondary | ICD-10-CM | POA: Diagnosis not present

## 2014-01-16 DIAGNOSIS — I2781 Cor pulmonale (chronic): Secondary | ICD-10-CM | POA: Diagnosis not present

## 2014-01-16 DIAGNOSIS — I10 Essential (primary) hypertension: Secondary | ICD-10-CM | POA: Diagnosis not present

## 2014-01-29 DIAGNOSIS — G8929 Other chronic pain: Secondary | ICD-10-CM | POA: Diagnosis not present

## 2014-01-29 DIAGNOSIS — G589 Mononeuropathy, unspecified: Secondary | ICD-10-CM | POA: Diagnosis not present

## 2014-01-29 DIAGNOSIS — M79669 Pain in unspecified lower leg: Secondary | ICD-10-CM | POA: Diagnosis not present

## 2014-01-29 DIAGNOSIS — E139 Other specified diabetes mellitus without complications: Secondary | ICD-10-CM | POA: Diagnosis not present

## 2014-03-12 DIAGNOSIS — H02054 Trichiasis without entropian left upper eyelid: Secondary | ICD-10-CM | POA: Diagnosis not present

## 2014-03-12 DIAGNOSIS — H40052 Ocular hypertension, left eye: Secondary | ICD-10-CM | POA: Diagnosis not present

## 2014-03-12 DIAGNOSIS — T1511XA Foreign body in conjunctival sac, right eye, initial encounter: Secondary | ICD-10-CM | POA: Diagnosis not present

## 2014-03-12 DIAGNOSIS — H02051 Trichiasis without entropian right upper eyelid: Secondary | ICD-10-CM | POA: Diagnosis not present

## 2014-03-12 DIAGNOSIS — H1851 Endothelial corneal dystrophy: Secondary | ICD-10-CM | POA: Diagnosis not present

## 2014-03-12 DIAGNOSIS — H04561 Stenosis of right lacrimal punctum: Secondary | ICD-10-CM | POA: Diagnosis not present

## 2014-03-12 DIAGNOSIS — H04562 Stenosis of left lacrimal punctum: Secondary | ICD-10-CM | POA: Diagnosis not present

## 2014-03-12 DIAGNOSIS — H5331 Abnormal retinal correspondence: Secondary | ICD-10-CM | POA: Diagnosis not present

## 2014-03-12 DIAGNOSIS — T1512XA Foreign body in conjunctival sac, left eye, initial encounter: Secondary | ICD-10-CM | POA: Diagnosis not present

## 2014-03-12 DIAGNOSIS — H18413 Arcus senilis, bilateral: Secondary | ICD-10-CM | POA: Diagnosis not present

## 2014-03-12 DIAGNOSIS — H5051 Esophoria: Secondary | ICD-10-CM | POA: Diagnosis not present

## 2014-03-12 DIAGNOSIS — H40012 Open angle with borderline findings, low risk, left eye: Secondary | ICD-10-CM | POA: Diagnosis not present

## 2014-03-14 DIAGNOSIS — E1165 Type 2 diabetes mellitus with hyperglycemia: Secondary | ICD-10-CM | POA: Diagnosis not present

## 2014-03-14 DIAGNOSIS — I10 Essential (primary) hypertension: Secondary | ICD-10-CM | POA: Diagnosis not present

## 2014-03-19 DIAGNOSIS — E1165 Type 2 diabetes mellitus with hyperglycemia: Secondary | ICD-10-CM | POA: Diagnosis not present

## 2014-03-19 DIAGNOSIS — I10 Essential (primary) hypertension: Secondary | ICD-10-CM | POA: Diagnosis not present

## 2014-03-19 DIAGNOSIS — E780 Pure hypercholesterolemia: Secondary | ICD-10-CM | POA: Diagnosis not present

## 2014-04-05 DIAGNOSIS — E119 Type 2 diabetes mellitus without complications: Secondary | ICD-10-CM | POA: Diagnosis not present

## 2014-04-05 DIAGNOSIS — E785 Hyperlipidemia, unspecified: Secondary | ICD-10-CM | POA: Diagnosis not present

## 2014-04-05 DIAGNOSIS — I10 Essential (primary) hypertension: Secondary | ICD-10-CM | POA: Diagnosis not present

## 2014-04-05 DIAGNOSIS — F329 Major depressive disorder, single episode, unspecified: Secondary | ICD-10-CM | POA: Diagnosis not present

## 2014-06-13 ENCOUNTER — Emergency Department (HOSPITAL_COMMUNITY)
Admission: EM | Admit: 2014-06-13 | Discharge: 2014-06-13 | Disposition: A | Discharge status: Home / Self Care | Payer: Medicare Other | Attending: Emergency Medicine | Admitting: Emergency Medicine

## 2014-06-13 ENCOUNTER — Emergency Department (HOSPITAL_COMMUNITY): Payer: Medicare Other

## 2014-06-13 ENCOUNTER — Encounter (HOSPITAL_COMMUNITY): Payer: Self-pay

## 2014-06-13 DIAGNOSIS — Z7982 Long term (current) use of aspirin: Secondary | ICD-10-CM | POA: Insufficient documentation

## 2014-06-13 DIAGNOSIS — Z87891 Personal history of nicotine dependence: Secondary | ICD-10-CM | POA: Insufficient documentation

## 2014-06-13 DIAGNOSIS — E119 Type 2 diabetes mellitus without complications: Secondary | ICD-10-CM | POA: Insufficient documentation

## 2014-06-13 DIAGNOSIS — Z9889 Other specified postprocedural states: Secondary | ICD-10-CM | POA: Insufficient documentation

## 2014-06-13 DIAGNOSIS — E785 Hyperlipidemia, unspecified: Secondary | ICD-10-CM | POA: Insufficient documentation

## 2014-06-13 DIAGNOSIS — J45909 Unspecified asthma, uncomplicated: Secondary | ICD-10-CM | POA: Diagnosis not present

## 2014-06-13 DIAGNOSIS — Z7951 Long term (current) use of inhaled steroids: Secondary | ICD-10-CM | POA: Diagnosis not present

## 2014-06-13 DIAGNOSIS — Z792 Long term (current) use of antibiotics: Secondary | ICD-10-CM | POA: Insufficient documentation

## 2014-06-13 DIAGNOSIS — Z9981 Dependence on supplemental oxygen: Secondary | ICD-10-CM | POA: Diagnosis not present

## 2014-06-13 DIAGNOSIS — R0602 Shortness of breath: Secondary | ICD-10-CM | POA: Diagnosis not present

## 2014-06-13 DIAGNOSIS — G4733 Obstructive sleep apnea (adult) (pediatric): Secondary | ICD-10-CM | POA: Insufficient documentation

## 2014-06-13 DIAGNOSIS — Z7952 Long term (current) use of systemic steroids: Secondary | ICD-10-CM | POA: Diagnosis not present

## 2014-06-13 DIAGNOSIS — J441 Chronic obstructive pulmonary disease with (acute) exacerbation: Secondary | ICD-10-CM | POA: Insufficient documentation

## 2014-06-13 DIAGNOSIS — I10 Essential (primary) hypertension: Secondary | ICD-10-CM | POA: Insufficient documentation

## 2014-06-13 DIAGNOSIS — Z8701 Personal history of pneumonia (recurrent): Secondary | ICD-10-CM | POA: Insufficient documentation

## 2014-06-13 DIAGNOSIS — R05 Cough: Secondary | ICD-10-CM | POA: Diagnosis present

## 2014-06-13 DIAGNOSIS — J40 Bronchitis, not specified as acute or chronic: Secondary | ICD-10-CM

## 2014-06-13 LAB — I-STAT TROPONIN, ED: Troponin i, poc: 0 ng/mL (ref 0.00–0.08)

## 2014-06-13 LAB — BASIC METABOLIC PANEL
Anion gap: 11 (ref 5–15)
BUN: 11 mg/dL (ref 6–23)
CO2: 28 mmol/L (ref 19–32)
Calcium: 10.6 mg/dL — ABNORMAL HIGH (ref 8.4–10.5)
Chloride: 97 mmol/L (ref 96–112)
Creatinine, Ser: 0.85 mg/dL (ref 0.50–1.35)
GFR calc Af Amer: >90 mL/min (ref >90)
GFR calc non Af Amer: 86 mL/min — ABNORMAL LOW (ref >90)
Glucose, Bld: 122 mg/dL — ABNORMAL HIGH (ref 70–99)
Potassium: 4.3 mmol/L (ref 3.5–5.1)
Sodium: 136 mmol/L (ref 135–145)

## 2014-06-13 LAB — CBC
HCT: 44.4 % (ref 39.0–52.0)
Hemoglobin: 14.5 g/dL (ref 13.0–17.0)
MCH: 29.6 pg (ref 26.0–34.0)
MCHC: 32.7 g/dL (ref 30.0–36.0)
MCV: 90.6 fL (ref 78.0–100.0)
Platelets: 241 10*3/uL (ref 150–400)
RBC: 4.9 MIL/uL (ref 4.22–5.81)
RDW: 13.8 % (ref 11.5–15.5)
WBC: 9.4 10*3/uL (ref 4.0–10.5)

## 2014-06-13 MED ORDER — ALBUTEROL SULFATE (2.5 MG/3ML) 0.083% IN NEBU
5.0000 mg | INHALATION_SOLUTION | Freq: Once | RESPIRATORY_TRACT | Status: AC
  Administered 2014-06-13: 5 mg via RESPIRATORY_TRACT
  Filled 2014-06-13: qty 6

## 2014-06-13 MED ORDER — METHYLPREDNISOLONE SODIUM SUCC 125 MG IJ SOLR
125.0000 mg | Freq: Once | INTRAMUSCULAR | Status: AC
  Administered 2014-06-13: 125 mg via INTRAVENOUS
  Filled 2014-06-13: qty 2

## 2014-06-13 MED ORDER — IPRATROPIUM BROMIDE 0.02 % IN SOLN
0.5000 mg | Freq: Once | RESPIRATORY_TRACT | Status: AC
  Administered 2014-06-13: 0.5 mg via RESPIRATORY_TRACT
  Filled 2014-06-13: qty 2.5

## 2014-06-13 MED ORDER — PREDNISONE 20 MG PO TABS
60.0000 mg | ORAL_TABLET | Freq: Once | ORAL | Status: AC
  Administered 2014-06-13: 60 mg via ORAL
  Filled 2014-06-13: qty 3

## 2014-06-13 MED ORDER — DOXYCYCLINE HYCLATE 100 MG PO CAPS: 100.0000 mg | ORAL_CAPSULE | Freq: Two times a day (BID) | ORAL | Status: DC

## 2014-06-13 MED ORDER — ALBUTEROL (5 MG/ML) CONTINUOUS INHALATION SOLN
10.0000 mg/h | INHALATION_SOLUTION | Freq: Once | RESPIRATORY_TRACT | Status: AC
  Administered 2014-06-13: 10 mg/h via RESPIRATORY_TRACT
  Filled 2014-06-13: qty 20

## 2014-06-13 MED ORDER — PREDNISONE 20 MG PO TABS: 60.0000 mg | ORAL_TABLET | Freq: Every day | ORAL | Status: DC

## 2014-06-13 NOTE — ED Provider Notes (Signed)
CSN: [id number]     Arrival date & time 06/13/14  1520 History   First MD Initiated Contact with Patient 06/13/14 1538     Chief Complaint  Patient presents with  . Cough  . Wheezing     (Consider location/radiation/quality/duration/timing/severity/associated sxs/prior Treatment) Patient is a 69 y.o. male presenting with cough and wheezing. The history is provided by the patient.  Cough Cough characteristics:  Productive Sputum characteristics:  Green Severity:  Mild Onset quality:  Gradual Duration:  2 days Timing:  Constant Progression:  Worsening Chronicity:  New Smoker: no   Context: upper respiratory infection (sore throat, nasal congestion 2 days prior)   Relieved by:  Nothing Worsened by:  Nothing tried Associated symptoms: shortness of breath, sore throat and wheezing   Associated symptoms: no fever   Wheezing Associated symptoms: cough, shortness of breath and sore throat   Associated symptoms: no fever     Past Medical History  Diagnosis Date  . Diabetes mellitus   . Hyperlipemia   . Hypertension   . Arthritis   . OSA on CPAP   . Asthma   . Pneumonia   . COPD (chronic obstructive pulmonary disease)    Past Surgical History  Procedure Laterality Date  . Cardiac catheterization    . Tonsillectomy     Family History  Problem Relation Age of Onset  . Emphysema Father   . Asthma Father   . Heart disease Father    History  Substance Use Topics  . Smoking status: Former Smoker -- 1.00 packs/day for 20 years    Types: Cigarettes    Quit date: 03/09/1985  . Smokeless tobacco: Never Used  . Alcohol Use: No    Review of Systems  Constitutional: Negative for fever.  HENT: Positive for sore throat.   Respiratory: Positive for cough, shortness of breath and wheezing.   Gastrointestinal: Negative for vomiting and abdominal pain.  All other systems reviewed and are negative.     Allergies  Codeine  Home Medications   Prior to Admission  medications   Medication Sig Start Date End Date Taking? Authorizing Provider  albuterol (VENTOLIN HFA) 108 (90 BASE) MCG/ACT inhaler Inhale 2 puffs into the lungs 4 (four) times daily.     Yes Historical Provider, MD  amLODipine-olmesartan (AZOR) 10-40 MG per tablet Take 1 tablet by mouth daily.     Yes Historical Provider, MD  Ascorbic Acid (VITAMIN C) 1000 MG tablet Take 1,000 mg by mouth daily.     Yes Historical Provider, MD  aspirin 325 MG tablet Take 325 mg by mouth daily.     Yes Historical Provider, MD  budesonide-formoterol (SYMBICORT) 160-4.5 MCG/ACT inhaler Inhale 2 puffs into the lungs 2 (two) times daily.     Yes Historical Provider, MD  carvedilol (COREG CR) 10 MG 24 hr capsule Take 10 mg by mouth daily.   Yes Historical Provider, MD  Cholecalciferol (VITAMIN D-1000 MAX ST) 1000 UNITS tablet Take 1,000 Units by mouth daily.     Yes Historical Provider, MD  clindamycin-benzoyl peroxide (BENZACLIN) gel Apply 1 application topically daily at 3 pm.    Yes Historical Provider, MD  clobetasol (TEMOVATE) 0.05 % cream Apply 1 application topically daily as needed (hands, arms, neck, thighs).    Yes Historical Provider, MD  Cellulose Carmellose Sodium POWD Apply 1 application topically daily. Apply to feet    Historical Provider, MD  clotrimazole (LOTRIMIN) 1 % cream Apply 1 application topically daily as needed (s). For skin  condition    Historical Provider, MD  fluocinolone (VANOS) 0.01 % cream Apply 1 application topically daily.     Historical Provider, MD  FLUoxetine (PROZAC) 20 MG tablet Take 20 mg by mouth daily.    Historical Provider, MD  fluticasone (FLONASE) 50 MCG/ACT nasal spray 2 sprays by Nasal route daily.      Historical Provider, MD  ketoconazole (NIZORAL) 2 % cream Apply 1 application topically daily.     Historical Provider, MD  Ketoconazole-Hydrocortisone (KETOCON PLUS EX) Apply 1 application topically daily.     Historical Provider, MD  metFORMIN (GLUCOPHAGE) 500 MG  tablet Take 2 tablets by mouth Twice daily. 07/28/10   Historical Provider, MD  montelukast (SINGULAIR) 10 MG tablet Take 10 mg by mouth at bedtime.      Historical Provider, MD  omega-3 acid ethyl esters (LOVAZA) 1 G capsule Take 2 g by mouth 2 (two) times daily.    Historical Provider, MD  omeprazole (PRILOSEC) 20 MG capsule Take 20 mg by mouth daily.    Historical Provider, MD  Probiotic Product (ALIGN) 4 MG CAPS Take 4 mg by mouth daily.     Historical Provider, MD  simvastatin (ZOCOR) 20 MG tablet Take 20 mg by mouth every evening.    Historical Provider, MD  solifenacin (VESICARE) 10 MG tablet Take 10 mg by mouth daily.    Historical Provider, MD  triamcinolone (KENALOG) 0.1 % cream Apply topically 2 (two) times daily.      Historical Provider, MD  zolpidem (AMBIEN) 10 MG tablet Take 10 mg by mouth at bedtime as needed for sleep.    Historical Provider, MD   BP 184/90 mmHg  Pulse 96  Temp(Src) 98.6 F (37 C) (Oral)  Resp 16  SpO2 91% Physical Exam  Constitutional: He is oriented to person, place, and time. He appears well-developed and well-nourished. No distress.  HENT:  Head: Normocephalic and atraumatic.  Mouth/Throat: No oropharyngeal exudate.  Eyes: EOM are normal. Pupils are equal, round, and reactive to light.  Neck: Normal range of motion. Neck supple.  Cardiovascular: Normal rate and regular rhythm.  Exam reveals no friction rub.   No murmur heard. Pulmonary/Chest: No respiratory distress (mild). He has decreased breath sounds (mild). He has wheezes. He has no rales.  Abdominal: He exhibits no distension. There is no tenderness. There is no rebound.  Musculoskeletal: Normal range of motion. He exhibits no edema.  Neurological: He is alert and oriented to person, place, and time.  Skin: He is not diaphoretic.  Nursing note and vitals reviewed.   ED Course  Procedures (including critical care time) Labs Review Labs Reviewed  CBC  BASIC METABOLIC PANEL  I-STAT  TROPOININ, ED    Imaging Review Dg Chest 2 View (if Patient Has Fever And/or Copd)  06/13/2014   CLINICAL DATA:  Extreme shortness of breath, wheezing, post breathing treatment, history asthma, treated hypertension, diabetes, former smoker  EXAM: CHEST  2 VIEW  COMPARISON:  02/06/2012  FINDINGS: Upper normal heart size.  Normal mediastinal contours and pulmonary vascularity.  Minimal LEFT basilar scarring and chronic central peribronchial thickening.  No acute infiltrate, pleural effusion or pneumothorax.  No acute osseous findings.  IMPRESSION: Chronic peribronchial thickening and LEFT basilar scarring without acute infiltrate.   Electronically Signed   By: Lavonia Dana M.D.   On: 06/13/2014 16:24     EKG Interpretation   Date/Time:  Wednesday June 13 2014 15:44:09 EDT Ventricular Rate:  96 PR Interval:  164 QRS  Duration: 78 QT Interval:  337 QTC Calculation: 426 R Axis:   14 Text Interpretation:  Sinus rhythm Ventricular premature complex Aberrant  conduction of SV complex(es) Anterior infarct, old No significant change  since last tracing Confirmed by Mingo Amber  MD, [place] [phone]) on 06/13/2014  3:56:29 PM      MDM   Final diagnoses:  COPD exacerbation  Bronchitis    69 year old male with history of COPD and asthma here with productive cough, shortness of breath. Began with URI symptoms of sore throat, nasal congestion and it has worsened since then. No fevers or chest pain. No abdominal pain or leg swelling. Here on exam he has diffuse wheezing with mild decreased air movement. No JVD, no peripheral edema. Concern for COPD exacerbation with mild superimposed infection. Will obtain chest x-ray, give hour-long continuous albuterol. After albuterol, sleeping comfortably. Wheezing much improved. CXR negative, will treat for bronchitis. Patient given antibiotics, steroids, stable for discharge.   Evelina Bucy, MD 06/13/14 [phone]

## 2014-06-13 NOTE — ED Notes (Addendum)
Patient c/o productive cough with green/tan sputum, nasal congestion, and expiratory wheezing x 2 days. Patient denies any fever. Patient states he has been using his Albuterol inhaler and it is not working.

## 2014-06-13 NOTE — Discharge Instructions (Signed)

## 2014-06-18 DIAGNOSIS — J449 Chronic obstructive pulmonary disease, unspecified: Secondary | ICD-10-CM | POA: Diagnosis not present

## 2014-06-18 DIAGNOSIS — I10 Essential (primary) hypertension: Secondary | ICD-10-CM | POA: Diagnosis not present

## 2014-06-18 DIAGNOSIS — E780 Pure hypercholesterolemia: Secondary | ICD-10-CM | POA: Diagnosis not present

## 2014-06-18 DIAGNOSIS — E118 Type 2 diabetes mellitus with unspecified complications: Secondary | ICD-10-CM | POA: Diagnosis not present

## 2014-06-19 ENCOUNTER — Encounter: Payer: Self-pay | Admitting: Gastroenterology

## 2014-06-26 ENCOUNTER — Encounter: Payer: Self-pay | Admitting: Emergency Medicine

## 2014-06-26 ENCOUNTER — Ambulatory Visit (INDEPENDENT_AMBULATORY_CARE_PROVIDER_SITE_OTHER): Payer: Medicare Other | Admitting: Emergency Medicine

## 2014-06-26 VITALS — BP 140/70 | HR 88 | Ht 67.0 in | Wt 194.0 lb

## 2014-06-26 DIAGNOSIS — J449 Chronic obstructive pulmonary disease, unspecified: Secondary | ICD-10-CM | POA: Diagnosis not present

## 2014-06-26 DIAGNOSIS — Z9989 Dependence on other enabling machines and devices: Secondary | ICD-10-CM

## 2014-06-26 DIAGNOSIS — G4733 Obstructive sleep apnea (adult) (pediatric): Secondary | ICD-10-CM | POA: Diagnosis not present

## 2014-06-26 NOTE — Patient Instructions (Signed)
Please continue Anoro once a day (every day)  Use albuterol 2 puffs if needed for shortness of breath Please stop Symbicort for now Follow with Dr Lamonte Sakai in 1 month to discuss your response to the medication

## 2014-06-26 NOTE — Progress Notes (Signed)
Subjective:    Patient ID: Douglas Hansen, male    DOB: 1945/04/10, 69 y.o.   MRN: [id number]  HPI 69 year old man, former smoker, Hx of DM,hypertension, OSA on CPAP. He has poor function testing from June 2012 that shows mixed obstruction and restriction. He has been treated for COPD with scheduled albuterol and Symbicort. He had to go to the ED in early April '16 for an apparent AE-COPD in setting of URI. He was treated with short course pred and doxy. Then saw Dr Maudie Mercury and was started on Anoro about 3 days ago. He is using symbicort prn.  He is on flonase and singulair. He is having cough and wheeze, especially at night.  He probably has 2-3 exacerbations a year.    Review of Systems  Constitutional: Negative for fever and unexpected weight change.  HENT: Negative for congestion, dental problem, ear pain, nosebleeds, postnasal drip, rhinorrhea, sinus pressure, sneezing, sore throat and trouble swallowing.   Eyes: Negative for redness and itching.  Respiratory: Positive for cough, chest tightness, shortness of breath and wheezing.   Cardiovascular: Negative for palpitations and leg swelling.  Gastrointestinal: Negative for nausea and vomiting.  Genitourinary: Negative for dysuria.  Musculoskeletal: Negative for joint swelling.  Skin: Negative for rash.  Neurological: Negative for headaches.  Hematological: Does not bruise/bleed easily.  Psychiatric/Behavioral: Negative for dysphoric mood. The patient is not nervous/anxious.     Past Medical History  Diagnosis Date  . Diabetes mellitus   . Hyperlipemia   . Hypertension   . Arthritis   . OSA on CPAP   . Asthma   . Pneumonia   . COPD (chronic obstructive pulmonary disease)      Family History  Problem Relation Age of Onset  . Emphysema Father   . Asthma Father   . Heart disease Father      History   Social History  . Marital Status: Married    Spouse Name: N/A  . Number of Children: 5  . Years of Education: N/A    Occupational History  . Retired    Social History Main Topics  . Smoking status: Former Smoker -- 1.00 packs/day for 20 years    Types: Cigarettes    Quit date: 03/09/1985  . Smokeless tobacco: Never Used  . Alcohol Use: No  . Drug Use: No  . Sexual Activity: Not on file   Other Topics Concern  . Not on file   Social History Narrative     Allergies  Allergen Reactions  . Codeine Other (See Comments)    REACTION: GI upset     Outpatient Prescriptions Prior to Visit  Medication Sig Dispense Refill  . albuterol (VENTOLIN HFA) 108 (90 BASE) MCG/ACT inhaler Inhale 2 puffs into the lungs 4 (four) times daily.      Marland Kitchen amLODipine-olmesartan (AZOR) 10-40 MG per tablet Take 1 tablet by mouth daily.      . Ascorbic Acid (VITAMIN C) 1000 MG tablet Take 1,000 mg by mouth daily.      Marland Kitchen aspirin 325 MG tablet Take 325 mg by mouth daily.      . budesonide-formoterol (SYMBICORT) 160-4.5 MCG/ACT inhaler Inhale 2 puffs into the lungs 2 (two) times daily.      . carvedilol (COREG CR) 10 MG 24 hr capsule Take 10 mg by mouth daily.    . Cellulose Carmellose Sodium POWD Apply 1 application topically daily. Apply to feet    . Cholecalciferol (VITAMIN D-1000 MAX ST) 1000 UNITS tablet Take  1,000 Units by mouth daily.      . clindamycin-benzoyl peroxide (BENZACLIN) gel Apply 1 application topically daily at 3 pm.     . clobetasol (TEMOVATE) 0.05 % cream Apply 1 application topically daily as needed (hands, arms, neck, thighs).     . clotrimazole (LOTRIMIN) 1 % cream Apply 1 application topically daily as needed (s). For skin condition    . fluocinolone (VANOS) 0.01 % cream Apply 1 application topically daily.     . fluticasone (FLONASE) 50 MCG/ACT nasal spray 2 sprays by Nasal route daily.      Marland Kitchen ketoconazole (NIZORAL) 2 % cream Apply 1 application topically daily.     . metFORMIN (GLUCOPHAGE) 500 MG tablet Take 2 tablets by mouth Twice daily. Take 2 tablets (1000 mg) in the am & Take 1 tablet (500  mg) in the pm.    . montelukast (SINGULAIR) 10 MG tablet Take 10 mg by mouth daily.     . Multiple Vitamins-Minerals (MULTIVITAMIN & MINERAL PO) Take 1 tablet by mouth daily.    Marland Kitchen omeprazole (PRILOSEC) 20 MG capsule Take 20 mg by mouth daily.    . Probiotic Product (ALIGN) 4 MG CAPS Take 4 mg by mouth daily.     . sertraline (ZOLOFT) 100 MG tablet Take 100 mg by mouth daily.    . simvastatin (ZOCOR) 20 MG tablet Take 20 mg by mouth every evening.    . triamcinolone (KENALOG) 0.1 % cream Apply topically 2 (two) times daily.      Marland Kitchen doxycycline (VIBRAMYCIN) 100 MG capsule Take 1 capsule (100 mg total) by mouth 2 (two) times daily. One po bid x 7 days 14 capsule 0  . predniSONE (DELTASONE) 20 MG tablet Take 3 tablets (60 mg total) by mouth daily. 12 tablet 0   No facility-administered medications prior to visit.   Was in the [employer], supply  Worked Marine scientist, custodial, some chemical exposures.       Objective:   Physical Exam Filed Vitals:   06/26/14 1140  BP: 140/70  Pulse: 88  Height: 5\' 7"  (1.702 m)  Weight: 194 lb (87.998 kg)  SpO2: 95%   Gen: Pleasant, well-nourished, in no distress,  normal affect  ENT: No lesions,  mouth clear,  oropharynx clear, no postnasal drip  Neck: No JVD, no TMG, no carotid bruits  Lungs: No use of accessory muscles,  clear without rales or rhonchi  Cardiovascular: RRR, heart sounds normal, no murmur or gallops, no peripheral edema  Musculoskeletal: No deformities, no cyanosis or clubbing  Neuro: alert, non focal  Skin: Warm, no lesions or rashes     Assessment & Plan:  OSA on CPAP Continue CPAP as ordered   COPD (chronic obstructive pulmonary disease) COPD that has been labile especially when he has a URI. He also has symptoms that bother him most at night with difficulty falling sleep or being awakened by cough. He was just started on Anoro and I would like to continue this to see if he benefits. He reports 2-3 exacerbations year and  this may be an indication for him to be on inhaled corticosteroid. We may decide to add one back depending on how he does on the Anoro.

## 2014-06-26 NOTE — Assessment & Plan Note (Signed)
COPD that has been labile especially when he has a URI. He also has symptoms that bother him most at night with difficulty falling sleep or being awakened by cough. He was just started on Anoro and I would like to continue this to see if he benefits. He reports 2-3 exacerbations year and this may be an indication for him to be on inhaled corticosteroid. We may decide to add one back depending on how he does on the Anoro.

## 2014-06-26 NOTE — Assessment & Plan Note (Signed)
Continue CPAP as ordered 

## 2014-07-10 DIAGNOSIS — J209 Acute bronchitis, unspecified: Secondary | ICD-10-CM | POA: Diagnosis not present

## 2014-07-17 DIAGNOSIS — T148 Other injury of unspecified body region: Secondary | ICD-10-CM | POA: Diagnosis not present

## 2014-07-17 DIAGNOSIS — E1165 Type 2 diabetes mellitus with hyperglycemia: Secondary | ICD-10-CM | POA: Diagnosis not present

## 2014-07-17 DIAGNOSIS — E780 Pure hypercholesterolemia: Secondary | ICD-10-CM | POA: Diagnosis not present

## 2014-07-17 DIAGNOSIS — I10 Essential (primary) hypertension: Secondary | ICD-10-CM | POA: Diagnosis not present

## 2014-07-30 ENCOUNTER — Ambulatory Visit: Payer: Federal, State, Local not specified - PPO | Admitting: Emergency Medicine

## 2014-08-10 ENCOUNTER — Other Ambulatory Visit (HOSPITAL_COMMUNITY): Payer: Self-pay | Admitting: Internal Medicine

## 2014-08-10 ENCOUNTER — Ambulatory Visit (HOSPITAL_COMMUNITY)
Admission: RE | Admit: 2014-08-10 | Discharge: 2014-08-10 | Disposition: A | Discharge status: Home / Self Care | Payer: Medicare Other | Source: Ambulatory Visit | Attending: Internal Medicine | Admitting: Internal Medicine

## 2014-08-10 ENCOUNTER — Encounter (HOSPITAL_COMMUNITY): Payer: Self-pay

## 2014-08-10 DIAGNOSIS — R51 Headache: Secondary | ICD-10-CM | POA: Diagnosis not present

## 2014-08-10 DIAGNOSIS — G44309 Post-traumatic headache, unspecified, not intractable: Secondary | ICD-10-CM | POA: Diagnosis not present

## 2014-08-10 DIAGNOSIS — Z9181 History of falling: Secondary | ICD-10-CM | POA: Insufficient documentation

## 2014-08-13 DIAGNOSIS — J4550 Severe persistent asthma, uncomplicated: Secondary | ICD-10-CM | POA: Diagnosis not present

## 2014-08-13 DIAGNOSIS — J309 Allergic rhinitis, unspecified: Secondary | ICD-10-CM | POA: Diagnosis not present

## 2014-09-14 DIAGNOSIS — I10 Essential (primary) hypertension: Secondary | ICD-10-CM | POA: Diagnosis not present

## 2014-09-14 DIAGNOSIS — E785 Hyperlipidemia, unspecified: Secondary | ICD-10-CM | POA: Diagnosis not present

## 2014-09-14 DIAGNOSIS — E119 Type 2 diabetes mellitus without complications: Secondary | ICD-10-CM | POA: Diagnosis not present

## 2014-09-17 ENCOUNTER — Other Ambulatory Visit: Payer: Self-pay | Admitting: Internal Medicine

## 2014-09-17 DIAGNOSIS — R945 Abnormal results of liver function studies: Secondary | ICD-10-CM

## 2014-09-17 DIAGNOSIS — I739 Peripheral vascular disease, unspecified: Secondary | ICD-10-CM | POA: Diagnosis not present

## 2014-09-17 DIAGNOSIS — K222 Esophageal obstruction: Secondary | ICD-10-CM | POA: Diagnosis not present

## 2014-09-17 DIAGNOSIS — I10 Essential (primary) hypertension: Secondary | ICD-10-CM | POA: Diagnosis not present

## 2014-09-17 DIAGNOSIS — R7989 Other specified abnormal findings of blood chemistry: Secondary | ICD-10-CM | POA: Diagnosis not present

## 2014-09-21 ENCOUNTER — Ambulatory Visit
Admission: RE | Admit: 2014-09-21 | Discharge: 2014-09-21 | Disposition: A | Discharge status: Home / Self Care | Payer: Medicare Other | Source: Ambulatory Visit | Attending: Internal Medicine | Admitting: Internal Medicine

## 2014-09-21 DIAGNOSIS — R7989 Other specified abnormal findings of blood chemistry: Secondary | ICD-10-CM | POA: Diagnosis not present

## 2014-09-21 DIAGNOSIS — R109 Unspecified abdominal pain: Secondary | ICD-10-CM | POA: Diagnosis not present

## 2014-09-21 DIAGNOSIS — R945 Abnormal results of liver function studies: Secondary | ICD-10-CM

## 2014-09-24 DIAGNOSIS — J309 Allergic rhinitis, unspecified: Secondary | ICD-10-CM | POA: Diagnosis not present

## 2014-09-24 DIAGNOSIS — J4540 Moderate persistent asthma, uncomplicated: Secondary | ICD-10-CM | POA: Diagnosis not present

## 2014-10-04 DIAGNOSIS — E785 Hyperlipidemia, unspecified: Secondary | ICD-10-CM | POA: Diagnosis not present

## 2014-10-04 DIAGNOSIS — E118 Type 2 diabetes mellitus with unspecified complications: Secondary | ICD-10-CM | POA: Diagnosis not present

## 2014-10-04 DIAGNOSIS — I10 Essential (primary) hypertension: Secondary | ICD-10-CM | POA: Diagnosis not present

## 2014-10-17 ENCOUNTER — Encounter: Payer: Self-pay | Admitting: Gastroenterology

## 2014-10-23 DIAGNOSIS — F4310 Post-traumatic stress disorder, unspecified: Secondary | ICD-10-CM | POA: Diagnosis not present

## 2014-10-26 DIAGNOSIS — F4310 Post-traumatic stress disorder, unspecified: Secondary | ICD-10-CM | POA: Diagnosis not present

## 2014-10-27 DIAGNOSIS — J449 Chronic obstructive pulmonary disease, unspecified: Secondary | ICD-10-CM | POA: Insufficient documentation

## 2014-11-13 ENCOUNTER — Encounter: Payer: Self-pay | Admitting: Gastroenterology

## 2014-12-04 ENCOUNTER — Ambulatory Visit (AMBULATORY_SURGERY_CENTER): Payer: Self-pay

## 2014-12-04 VITALS — Ht 67.0 in | Wt 195.8 lb

## 2014-12-04 DIAGNOSIS — F4310 Post-traumatic stress disorder, unspecified: Secondary | ICD-10-CM | POA: Diagnosis not present

## 2014-12-04 DIAGNOSIS — Z1211 Encounter for screening for malignant neoplasm of colon: Secondary | ICD-10-CM

## 2014-12-04 NOTE — Progress Notes (Signed)
No allergies to eggs or soy No diet/weight loss meds No home oxygen No past problems with anesthesia  Has email  Emmi instructions given for colonoscopy 

## 2014-12-11 ENCOUNTER — Ambulatory Visit (AMBULATORY_SURGERY_CENTER): Payer: Medicare Other | Admitting: Gastroenterology

## 2014-12-11 ENCOUNTER — Encounter: Payer: Self-pay | Admitting: Gastroenterology

## 2014-12-11 VITALS — BP 154/95 | HR 70 | Temp 97.7°F | Resp 20 | Ht 67.0 in | Wt 195.0 lb

## 2014-12-11 DIAGNOSIS — K635 Polyp of colon: Secondary | ICD-10-CM

## 2014-12-11 DIAGNOSIS — G4733 Obstructive sleep apnea (adult) (pediatric): Secondary | ICD-10-CM | POA: Diagnosis not present

## 2014-12-11 DIAGNOSIS — F329 Major depressive disorder, single episode, unspecified: Secondary | ICD-10-CM | POA: Diagnosis not present

## 2014-12-11 DIAGNOSIS — Z1211 Encounter for screening for malignant neoplasm of colon: Secondary | ICD-10-CM

## 2014-12-11 DIAGNOSIS — I10 Essential (primary) hypertension: Secondary | ICD-10-CM | POA: Diagnosis not present

## 2014-12-11 DIAGNOSIS — E119 Type 2 diabetes mellitus without complications: Secondary | ICD-10-CM | POA: Diagnosis not present

## 2014-12-11 DIAGNOSIS — J449 Chronic obstructive pulmonary disease, unspecified: Secondary | ICD-10-CM | POA: Diagnosis not present

## 2014-12-11 DIAGNOSIS — D124 Benign neoplasm of descending colon: Secondary | ICD-10-CM

## 2014-12-11 DIAGNOSIS — D175 Benign lipomatous neoplasm of intra-abdominal organs: Secondary | ICD-10-CM | POA: Diagnosis not present

## 2014-12-11 LAB — GLUCOSE, CAPILLARY
Glucose-Capillary: 97 mg/dL (ref 65–99)
Glucose-Capillary: 71 mg/dL (ref 65–99)

## 2014-12-11 MED ORDER — SODIUM CHLORIDE 0.9 % IV SOLN: 500.0000 mL | INTRAVENOUS | Status: DC

## 2014-12-11 NOTE — Patient Instructions (Signed)
YOU HAD AN ENDOSCOPIC PROCEDURE TODAY AT THE [facility]:   Refer to the procedure report that was given to you for any specific questions about what was found during the examination.  If the procedure report does not answer your questions, please call your gastroenterologist to clarify.  If you requested that your care partner not be given the details of your procedure findings, then the procedure report has been included in a sealed envelope for you to review at your convenience later.  YOU SHOULD EXPECT: Some feelings of bloating in the abdomen. Passage of more gas than usual.  Walking can help get rid of the air that was put into your GI tract during the procedure and reduce the bloating. If you had a lower endoscopy (such as a colonoscopy or flexible sigmoidoscopy) you may notice spotting of blood in your stool or on the toilet paper. If you underwent a bowel prep for your procedure, you may not have a normal bowel movement for a few days.  Please Note:  You might notice some irritation and congestion in your nose or some drainage.  This is from the oxygen used during your procedure.  There is no need for concern and it should clear up in a day or so.  SYMPTOMS TO REPORT IMMEDIATELY:   Following lower endoscopy (colonoscopy or flexible sigmoidoscopy):  Excessive amounts of blood in the stool  Significant tenderness or worsening of abdominal pains  Swelling of the abdomen that is new, acute  Fever of 100F or higher   For urgent or emergent issues, a gastroenterologist can be reached at any hour by calling [phone].   DIET: Your first meal following the procedure should be a small meal and then it is ok to progress to your normal diet. Heavy or fried foods are harder to digest and may make you feel nauseous or bloated.  Likewise, meals heavy in dairy and vegetables can increase bloating.  Drink plenty of fluids but you should avoid alcoholic beverages for 24  hours.  ACTIVITY:  You should plan to take it easy for the rest of today and you should NOT DRIVE or use heavy machinery until tomorrow (because of the sedation medicines used during the test).    FOLLOW UP: Our staff will call the number listed on your records the next business day following your procedure to check on you and address any questions or concerns that you may have regarding the information given to you following your procedure. If we do not reach you, we will leave a message.  However, if you are feeling well and you are not experiencing any problems, there is no need to return our call.  We will assume that you have returned to your regular daily activities without incident.  If any biopsies were taken you will be contacted by phone or by letter within the next 1-3 weeks.  Please call us at [phone] if you have not heard about the biopsies in 3 weeks.    SIGNATURES/CONFIDENTIALITY: You and/or your care partner have signed paperwork which will be entered into your electronic medical record.  These signatures attest to the fact that that the information above on your After Visit Summary has been reviewed and is understood.  Full responsibility of the confidentiality of this discharge information lies with you and/or your care-partner.    Resume medications. Information given on polyps. 

## 2014-12-11 NOTE — Progress Notes (Signed)
Called to room to assist during endoscopic procedure.  Patient ID and intended procedure confirmed with present staff. Received instructions for my participation in the procedure from the performing physician.  

## 2014-12-11 NOTE — Op Note (Signed)
[place]  [address] [place], [id number]   COLONOSCOPY PROCEDURE REPORT  PATIENT: Douglas Hansen, Douglas Hansen  MR#: [id number] BIRTHDATE: 10/16/1945 , 69  yrs. old GENDER: male ENDOSCOPIST: Milus Banister, MD PROCEDURE DATE:  12/11/2014 PROCEDURE:   Colonoscopy, screening and Colonoscopy with snare polypectomy First Screening Colonoscopy - Avg.  risk and is 50 yrs.  old or older - No.  Prior Negative Screening - Now for repeat screening. 10 or more years since last screening  History of Adenoma - Now for follow-up colonoscopy & has been > or = to 3 yrs.  N/A  Polyps removed today? Yes ASA CLASS:   Class II INDICATIONS:Screening for colonic neoplasia and Colorectal Neoplasm Risk Assessment for this procedure is average risk, colonoscopy Dr. Lajoyce Corners 2006 was normal. MEDICATIONS: Monitored anesthesia care and Propofol 160 mg IV  DESCRIPTION OF PROCEDURE:   After the risks benefits and alternatives of the procedure were thoroughly explained, informed consent was obtained.  The digital rectal exam revealed no abnormalities of the rectum.   The [id number]  endoscope was introduced through the anus and advanced to the cecum, which was identified by both the appendix and ileocecal valve. No adverse events experienced.   The quality of the prep was excellent.  The instrument was then slowly withdrawn as the colon was fully examined. Estimated blood loss is zero unless otherwise noted in this procedure report.   COLON FINDINGS: A sessile polyp measuring 5 mm in size was found in the descending colon.  A polypectomy was performed with a cold snare.  The resection was complete, the polyp tissue was completely retrieved and sent to histology.  Retroflexed views revealed internal Grade I hemorrhoids. The time to cecum = 4.4 Withdrawal time = 9.0   The scope was withdrawn and the procedure completed. COMPLICATIONS: There were no immediate complications.  ENDOSCOPIC  IMPRESSION: Sessile polyp was found in the descending colon; polypectomy was performed with a cold snare  RECOMMENDATIONS: If the polyp(s) removed today are proven to be adenomatous (pre-cancerous) polyps, you will need a repeat colonoscopy in 5 years.  Otherwise you should continue to follow colorectal cancer screening guidelines for "routine risk" patients with colonoscopy in 10 years.  You will receive a letter within 1-2 weeks with the results of your biopsy as well as final recommendations.  Please call my office if you have not received a letter after 3 weeks.  eSigned:  Milus Banister, MD 12/11/2014 2:22 PM   cc: [name], MD

## 2014-12-11 NOTE — Progress Notes (Signed)
Report to PACU, RN, vss, BBS= Clear.  

## 2014-12-11 NOTE — Progress Notes (Signed)
Pt. Cbg=69 4oz of apple juice given. Pt. Stable upon discharge no s/s of hypoglycemia.

## 2014-12-12 ENCOUNTER — Telehealth: Payer: Self-pay | Admitting: *Deleted

## 2014-12-12 NOTE — Telephone Encounter (Signed)
  Follow up Call-  Call back number 12/11/2014  Post procedure Call Back phone  # [phone]  Permission to leave phone message Yes     Patient questions:  Do you have a fever, pain , or abdominal swelling? No. Pain Score  0 *  Have you tolerated food without any problems? Yes.    Have you been able to return to your normal activities? Yes  Do you have any questions about your discharge instructions: Diet   No. Medications  No. Follow up visit  No.  Do you have questions or concerns about your Care? No.  Actions: * If pain score is 4 or above: No action needed, pain <4.

## 2014-12-17 ENCOUNTER — Ambulatory Visit: Payer: Self-pay | Admitting: Pediatrics

## 2014-12-19 ENCOUNTER — Encounter: Payer: Self-pay | Admitting: Gastroenterology

## 2014-12-19 DIAGNOSIS — Z125 Encounter for screening for malignant neoplasm of prostate: Secondary | ICD-10-CM | POA: Diagnosis not present

## 2014-12-19 DIAGNOSIS — E559 Vitamin D deficiency, unspecified: Secondary | ICD-10-CM | POA: Diagnosis not present

## 2014-12-19 DIAGNOSIS — E1165 Type 2 diabetes mellitus with hyperglycemia: Secondary | ICD-10-CM | POA: Diagnosis not present

## 2014-12-19 DIAGNOSIS — E291 Testicular hypofunction: Secondary | ICD-10-CM | POA: Diagnosis not present

## 2014-12-19 DIAGNOSIS — I10 Essential (primary) hypertension: Secondary | ICD-10-CM | POA: Diagnosis not present

## 2014-12-24 DIAGNOSIS — E7800 Pure hypercholesterolemia, unspecified: Secondary | ICD-10-CM | POA: Diagnosis not present

## 2014-12-24 DIAGNOSIS — I10 Essential (primary) hypertension: Secondary | ICD-10-CM | POA: Diagnosis not present

## 2014-12-24 DIAGNOSIS — E559 Vitamin D deficiency, unspecified: Secondary | ICD-10-CM | POA: Diagnosis not present

## 2014-12-24 DIAGNOSIS — E1165 Type 2 diabetes mellitus with hyperglycemia: Secondary | ICD-10-CM | POA: Diagnosis not present

## 2015-01-07 ENCOUNTER — Encounter: Payer: Self-pay | Admitting: Pediatrics

## 2015-01-07 ENCOUNTER — Ambulatory Visit (INDEPENDENT_AMBULATORY_CARE_PROVIDER_SITE_OTHER): Payer: Medicare Other | Admitting: Pediatrics

## 2015-01-07 VITALS — BP 148/76 | HR 60 | Resp 20

## 2015-01-07 DIAGNOSIS — J3089 Other allergic rhinitis: Secondary | ICD-10-CM

## 2015-01-07 DIAGNOSIS — Z9989 Dependence on other enabling machines and devices: Secondary | ICD-10-CM

## 2015-01-07 DIAGNOSIS — J302 Other seasonal allergic rhinitis: Secondary | ICD-10-CM | POA: Insufficient documentation

## 2015-01-07 DIAGNOSIS — J4540 Moderate persistent asthma, uncomplicated: Secondary | ICD-10-CM | POA: Diagnosis not present

## 2015-01-07 DIAGNOSIS — G4733 Obstructive sleep apnea (adult) (pediatric): Secondary | ICD-10-CM

## 2015-01-07 DIAGNOSIS — J449 Chronic obstructive pulmonary disease, unspecified: Secondary | ICD-10-CM

## 2015-01-07 MED ORDER — BUDESONIDE-FORMOTEROL FUMARATE 80-4.5 MCG/ACT IN AERO: 2.0000 | INHALATION_SPRAY | Freq: Two times a day (BID) | RESPIRATORY_TRACT | Status: DC

## 2015-01-07 MED ORDER — MONTELUKAST SODIUM 10 MG PO TABS: 10.0000 mg | ORAL_TABLET | Freq: Every day | ORAL | Status: DC

## 2015-01-07 MED ORDER — ALBUTEROL SULFATE HFA 108 (90 BASE) MCG/ACT IN AERS: 2.0000 | INHALATION_SPRAY | Freq: Four times a day (QID) | RESPIRATORY_TRACT | Status: DC | PRN

## 2015-01-07 MED ORDER — FLUTICASONE PROPIONATE 50 MCG/ACT NA SUSP: 2.0000 | Freq: Every day | NASAL | Status: DC

## 2015-01-07 NOTE — Progress Notes (Signed)
  [address]  [place] [address] Dept: [phone]  FOLLOW UP NOTE  Patient ID: Douglas Hansen, male    DOB: 03-Dec-1945  Age: 69 y.o. MRN: [id number] Date of Office Visit: 01/07/2015  Assessment Chief Complaint: Asthma; Wheezing; Nasal Congestion; Breathing Problem; and Medication Refill  HPI Douglas Hansen presents for follow-up of his asthma and COPD. He has had a flu vaccination. His asthma , COPD and allergic rhinitis are under control with his current medications. His diabetes is well controlled. His obstructive sleep apnea is well controlled with CPAP  Current medications are Symbicort 80-2 puffs every 12 hours, montelukast  10 mg once a day, pro-air 2 puffs every 4 hours if needed, fluticasone 2 sprays per nostril once a day if needed for stuffy nose and cetirizine 10 mg once a day if needed for runny nose . His other medications are outlined in the chart   Drug Allergies:  Allergies  Allergen Reactions  . Codeine Other (See Comments)    REACTION: GI upset  . Lipitor [Atorvastatin]   . Pravachol [Pravastatin Sodium]     Physical Exam: BP 148/76 mmHg  Pulse 60  Resp 20   Physical Exam  Constitutional: He is oriented to person, place, and time. He appears well-developed and well-nourished.  HENT:  Eyes normal. Ears normal. Nose normal. Pharynx normal.  Neck: Neck supple.  Cardiovascular:  S1 and S2 normal no murmurs  Pulmonary/Chest:  Clear to percussion and auscultation  Lymphadenopathy:    He has no cervical adenopathy.  Neurological: He is alert and oriented to person, place, and time.    Diagnostics:  FVC 1.48 L FEV1 1.07 L. Predicted FVC 2.57 L predicted FEV1 2.03 L-this shows a moderate reduction of forced hot capacity  Assessment and Plan: 1. Chronic obstructive pulmonary disease, unspecified COPD, unspecified chronic bronchitis type   2. Moderate persistent asthma, uncomplicated   3. Other allergic rhinitis   4. Obstructive sleep apnea treated  with continuous positive airway pressure (CPAP)   5. Asthma, chronic, moderate persistent, uncomplicated         Patient Instructions  Symbicort 80- 2 puffs every 12 hours to prevent coughing or wheezing Montelukast  10 mg once a day Pro-air 2 puffs every 4 hours if needed for wheezing or coughing spells Fluticasone 2 sprays per nostril once a day if needed for stuffy nose Cetirizine 10 mg once a day if needed for runny nose He will call me if he's not doing well on this treatment plan    Return in about 6 months (around 07/07/2015).    Thank you for the opportunity to care for this patient.  Please do not hesitate to contact me with questions.  Penne Lash, M.D.  [facility] [address] [place], [place] [address] [phone]

## 2015-01-07 NOTE — Patient Instructions (Addendum)
Symbicort 80- 2 puffs every 12 hours to prevent coughing or wheezing Montelukast  10 mg once a day Pro-air 2 puffs every 4 hours if needed for wheezing or coughing spells Fluticasone 2 sprays per nostril once a day if needed for stuffy nose Cetirizine 10 mg once a day if needed for runny nose He will call me if he's not doing well on this treatment plan

## 2015-03-13 DIAGNOSIS — J218 Acute bronchiolitis due to other specified organisms: Secondary | ICD-10-CM | POA: Diagnosis not present

## 2015-03-13 DIAGNOSIS — R399 Unspecified symptoms and signs involving the genitourinary system: Secondary | ICD-10-CM | POA: Diagnosis not present

## 2015-03-27 ENCOUNTER — Telehealth: Payer: Self-pay | Admitting: Internal Medicine

## 2015-03-27 DIAGNOSIS — R0609 Other forms of dyspnea: Secondary | ICD-10-CM | POA: Diagnosis not present

## 2015-03-27 DIAGNOSIS — R05 Cough: Secondary | ICD-10-CM | POA: Diagnosis not present

## 2015-03-27 DIAGNOSIS — I10 Essential (primary) hypertension: Secondary | ICD-10-CM | POA: Diagnosis not present

## 2015-03-27 DIAGNOSIS — E1165 Type 2 diabetes mellitus with hyperglycemia: Secondary | ICD-10-CM | POA: Diagnosis not present

## 2015-03-27 DIAGNOSIS — E7800 Pure hypercholesterolemia, unspecified: Secondary | ICD-10-CM | POA: Diagnosis not present

## 2015-03-27 NOTE — Telephone Encounter (Signed)
Received records from [facility] for appointment on 04/17/15 with Dr Debara Pickett.  Records given to [facility] (medical records) for Dr Lysbeth Penner schedule on 04/17/15. lp

## 2015-04-02 DIAGNOSIS — E118 Type 2 diabetes mellitus with unspecified complications: Secondary | ICD-10-CM | POA: Diagnosis not present

## 2015-04-02 DIAGNOSIS — E785 Hyperlipidemia, unspecified: Secondary | ICD-10-CM | POA: Diagnosis not present

## 2015-04-02 DIAGNOSIS — I10 Essential (primary) hypertension: Secondary | ICD-10-CM | POA: Diagnosis not present

## 2015-04-04 DIAGNOSIS — E785 Hyperlipidemia, unspecified: Secondary | ICD-10-CM | POA: Diagnosis not present

## 2015-04-04 DIAGNOSIS — I10 Essential (primary) hypertension: Secondary | ICD-10-CM | POA: Diagnosis not present

## 2015-04-04 DIAGNOSIS — E1165 Type 2 diabetes mellitus with hyperglycemia: Secondary | ICD-10-CM | POA: Diagnosis not present

## 2015-04-15 DIAGNOSIS — J40 Bronchitis, not specified as acute or chronic: Secondary | ICD-10-CM | POA: Diagnosis not present

## 2015-04-17 ENCOUNTER — Ambulatory Visit (INDEPENDENT_AMBULATORY_CARE_PROVIDER_SITE_OTHER): Payer: Medicare Other | Admitting: Internal Medicine

## 2015-04-17 ENCOUNTER — Encounter: Payer: Self-pay | Admitting: Internal Medicine

## 2015-04-17 VITALS — BP 132/84 | HR 95 | Ht 67.0 in | Wt 192.0 lb

## 2015-04-17 DIAGNOSIS — E785 Hyperlipidemia, unspecified: Secondary | ICD-10-CM

## 2015-04-17 DIAGNOSIS — I10 Essential (primary) hypertension: Secondary | ICD-10-CM

## 2015-04-17 DIAGNOSIS — J810 Acute pulmonary edema: Secondary | ICD-10-CM | POA: Insufficient documentation

## 2015-04-17 DIAGNOSIS — Z9989 Dependence on other enabling machines and devices: Secondary | ICD-10-CM

## 2015-04-17 DIAGNOSIS — G4733 Obstructive sleep apnea (adult) (pediatric): Secondary | ICD-10-CM

## 2015-04-17 DIAGNOSIS — R0602 Shortness of breath: Secondary | ICD-10-CM | POA: Diagnosis not present

## 2015-04-17 DIAGNOSIS — J438 Other emphysema: Secondary | ICD-10-CM

## 2015-04-17 NOTE — Progress Notes (Signed)
OFFICE NOTE  Chief Complaint:  DOE, pulmonary edema  Primary Care Physician: [name], MD  HPI:  Douglas Hansen is a pleasant 70 year old male who is currently referred to me by Dr. Maudie Mercury. His past [name] history is extensive and includes the following: Hypertension, dyslipidemia, type 2 diabetes, COPD, obstructive sleep apnea on CPAP, GERD, mild PAD, and significant allergic symptoms. He was previously followed by Dr. Einar Gip and has had 2 cardiac catheterizations in his past. His last heart catheterization was in 2004 which showed no significant coronary disease. He had a stress test in February 2014 which was negative for ischemia and EF of 68%. He also had ABIs for leg pain which were 0.9 on the right and 0.94 on the left. He was started on Pletal but had some shortness of breath and abnormal symptoms on it and this was discontinued. His last echocardiogram was in March 2014 showed an EF of 52% with mild to moderate right atrial enlargement, dilated IVC and elevated right atrial pressure. Recently had an upper respiratory infection concerning for COPD exacerbation. He was started on Avelox which she completed. At the end of treatment he felt some improvement in his breathing but then started to have shortness of breath again and cough. He was switched to azithromycin and placed on a prednisone Dosepak. He was also started on low-dose Lasix for chest x-ray findings of some fluid in the right fissure. Douglas Hansen says that he gained a few pounds in weight with this shortness of breath and noted that he's been on Lasix now for 10 days and that he's lost some weight and feels less fullness in his belly and in his chest. He denied any ankle edema. His shortness of breath is improving. He also denies any chest pain or anginal symptoms. He had a mild amount of orthopnea which is also resolving.  PMHx:  Past Medical History  Diagnosis Date  . Diabetes mellitus   . Hyperlipemia   . Hypertension   .  Arthritis   . OSA on CPAP   . Asthma   . Pneumonia   . COPD (chronic obstructive pulmonary disease) [facility])     Past Surgical History  Procedure Laterality Date  . Cardiac catheterization    . Tonsillectomy    . Right thumb surgery      FAMHx:  Family History  Problem Relation Age of Onset  . Emphysema Father   . Asthma Father   . Heart disease Father   . Colon cancer Neg Hx     SOCHx:   reports that he quit smoking about 30 years ago. His smoking use included Cigarettes. He has a 20 pack-year smoking history. He has never used smokeless tobacco. He reports that he does not drink alcohol or use illicit drugs.  ALLERGIES:  Allergies  Allergen Reactions  . Codeine Other (See Comments)    REACTION: GI upset  . Lipitor [Atorvastatin]   . Pravachol [Pravastatin Sodium]     ROS: Pertinent items noted in HPI and remainder of comprehensive ROS otherwise negative.  HOME MEDS: Current Outpatient Prescriptions  Medication Sig Dispense Refill  . albuterol (PROAIR HFA) 108 (90 BASE) MCG/ACT inhaler Inhale 2 puffs into the lungs every 6 (six) hours as needed for wheezing or shortness of breath. 1 Inhaler 2  . Alpha-D-Galactosidase (BEANO PO) Take by mouth as needed.    Marland Kitchen amLODipine-olmesartan (AZOR) 10-40 MG per tablet Take 1 tablet by mouth daily.      . Ascorbic  Acid (VITAMIN C) 1000 MG tablet Take 1,000 mg by mouth daily.      Marland Kitchen aspirin 325 MG tablet Take 325 mg by mouth daily.      Marland Kitchen azithromycin (ZITHROMAX) 1 g powder Take 1 g by mouth once.    . budesonide-formoterol (SYMBICORT) 160-4.5 MCG/ACT inhaler Inhale 2 puffs into the lungs 2 (two) times daily.      . budesonide-formoterol (SYMBICORT) 80-4.5 MCG/ACT inhaler Inhale 2 puffs into the lungs 2 (two) times daily. 1 Inhaler 2  . carvedilol (COREG) 3.125 MG tablet Take 3.125 mg by mouth 2 (two) times daily.   1  . Cellulose Carmellose Sodium POWD Apply 1 application topically daily. Apply to feet    . Cholecalciferol (VITAMIN  D-1000 MAX ST) 1000 UNITS tablet Take 1,000 Units by mouth daily.      . clindamycin-benzoyl peroxide (BENZACLIN) gel Apply 1 application topically daily at 3 pm.     . clobetasol (TEMOVATE) 0.05 % cream Apply 1 application topically daily as needed (hands, arms, neck, thighs).     . clotrimazole (LOTRIMIN) 1 % cream Apply 1 application topically daily as needed (s). For skin condition    . Diclofenac Sodium 1.5 % SOLN Apply 12 drops topically. Uses 12-14 drops on scalp as needed  0  . fluocinolone (VANOS) 0.01 % cream Apply 1 application topically daily.     Marland Kitchen FLUoxetine (PROZAC) 20 MG capsule Take 20 mg by mouth daily.    . fluticasone (FLONASE) 50 MCG/ACT nasal spray Place 2 sprays into both nostrils daily. 16 g 5  . furosemide (LASIX) 20 MG tablet Take 20 mg by mouth daily.    Marland Kitchen ketoconazole (NIZORAL) 2 % cream Apply 1 application topically daily.     . metFORMIN (GLUCOPHAGE) 500 MG tablet Take 2 tablets by mouth Twice daily.     . montelukast (SINGULAIR) 10 MG tablet Take 1 tablet (10 mg total) by mouth daily. 30 tablet 5  . Multiple Vitamins-Minerals (MULTIVITAMIN & MINERAL PO) Take 1 tablet by mouth daily.    Marland Kitchen omeprazole (PRILOSEC) 20 MG capsule Take 20 mg by mouth daily.    . predniSONE (DELTASONE) 10 MG tablet Take 10 mg by mouth daily with breakfast.    . Probiotic Product (ALIGN) 4 MG CAPS Take 4 mg by mouth daily.     . simvastatin (ZOCOR) 20 MG tablet Take 20 mg by mouth every evening.    . solifenacin (VESICARE) 5 MG tablet Take 5 mg by mouth as needed.    . tiotropium (SPIRIVA) 18 MCG inhalation capsule Place 18 mcg into inhaler and inhale 2 (two) times daily.     Marland Kitchen triamcinolone (KENALOG) 0.1 % cream Apply topically 2 (two) times daily.      Marland Kitchen VICTOZA 18 MG/3ML SOPN Inject 0.6 mg into the skin daily.  0   No current facility-administered medications for this visit.    LABS/IMAGING: No results found for this or any previous visit (from the past 48 hour(s)). No results  found.  WEIGHTS: Wt Readings from Last 3 Encounters:  04/17/15 192 lb (87.091 kg)  12/11/14 195 lb (88.451 kg)  12/04/14 195 lb 12.8 oz (88.814 kg)    VITALS: BP 132/84 mmHg  Pulse 95  Ht 5\' 7"  (1.702 m)  Wt 192 lb (87.091 kg)  BMI 30.06 kg/m2  EXAM: General appearance: alert, no distress and mildly obese Neck: no carotid bruit, no JVD and thyroid not enlarged, symmetric, no tenderness/mass/nodules Lungs: diminished breath sounds RLL  Heart: regular rate and rhythm and S1, S2 normal Abdomen: soft, non-tender; bowel sounds normal; no masses,  no organomegaly Extremities: extremities normal, atraumatic, no cyanosis or edema Pulses: 2+ and symmetric Skin: Skin color, texture, turgor normal. No rashes or lesions Neurologic: Grossly normal Psych: Pleasant  EKG: Normal sinus rhythm at 95, inferior Q waves in 3 and aVF and poor R-wave progression anteriorly  ASSESSMENT: 1. Dyspnea on exertion and acute pulmonary edema, likely secondary to right heart failure or diastolic dysfunction 2. COPD with acute exacerbation 3. Hypertension 4. Dyslipidemia 5. OSA on CPAP 6. Type 2 diabetes 7. Mildly reduced ABIs without lifestyle limiting claudication  PLAN: 1.   Douglas Hansen had some recent worsening of shortness of breath which was probably exacerbated by a pulmonary infection. He seems to be responding to additional antibiotics and steroids as well as the addition of a low-dose diuretic. His last echo 2014 showed low normal systolic function at [id number] with right heart enlargement and elevated right heart pressures. As can suggest that there is underlying right heart dysfunction, likely secondary to COPD. I like to repeat an echocardiogram and particular focus on right heart function, right-sided pressures and pulmonary pressures. This will also help to rule out any left heart dysfunction and also evaluate for diastolic dysfunction. For the time being I agree with staying on low-dose Lasix as it  seems to be working well for him. He's not describing any chest pain symptoms, therefore ischemia testing is not indicated at this time. Finally he does have a history of mildly reduced ABIs, but denies any lifestyle limiting claudication. He occasionally gets leg pain and has some balance and coordination issues which I do not think are related to peripheral arterial disease.  Thanks for the kind referral. We'll see him back to discuss results of his echo in a few weeks.  Pixie Casino, MD, [facility] Attending Cardiologist Reveles C Gearlene Godsil 04/17/2015, 10:14 AM

## 2015-04-17 NOTE — Patient Instructions (Signed)
Your physician has requested that you have an echocardiogram. Echocardiography is a painless test that uses sound waves to create images of your heart. It provides your doctor with information about the size and shape of your heart and how well your heart's chambers and valves are working. This procedure takes approximately one hour. There are no restrictions for this procedure.this will be done at the [address] location.   Your physician recommends that you schedule a follow-up appointment in: after echo.

## 2015-04-18 ENCOUNTER — Encounter: Payer: Self-pay | Admitting: Internal Medicine

## 2015-04-22 DIAGNOSIS — E7800 Pure hypercholesterolemia, unspecified: Secondary | ICD-10-CM | POA: Diagnosis not present

## 2015-04-22 DIAGNOSIS — R0600 Dyspnea, unspecified: Secondary | ICD-10-CM | POA: Diagnosis not present

## 2015-04-22 DIAGNOSIS — Z1389 Encounter for screening for other disorder: Secondary | ICD-10-CM | POA: Diagnosis not present

## 2015-04-22 DIAGNOSIS — E1165 Type 2 diabetes mellitus with hyperglycemia: Secondary | ICD-10-CM | POA: Diagnosis not present

## 2015-04-22 DIAGNOSIS — G4733 Obstructive sleep apnea (adult) (pediatric): Secondary | ICD-10-CM | POA: Diagnosis not present

## 2015-05-01 ENCOUNTER — Ambulatory Visit (HOSPITAL_COMMUNITY): Payer: Medicare Other | Attending: Cardiology

## 2015-05-01 ENCOUNTER — Other Ambulatory Visit: Payer: Self-pay

## 2015-05-01 DIAGNOSIS — J810 Acute pulmonary edema: Secondary | ICD-10-CM

## 2015-05-01 DIAGNOSIS — E785 Hyperlipidemia, unspecified: Secondary | ICD-10-CM | POA: Diagnosis not present

## 2015-05-01 DIAGNOSIS — E119 Type 2 diabetes mellitus without complications: Secondary | ICD-10-CM | POA: Insufficient documentation

## 2015-05-01 DIAGNOSIS — R0602 Shortness of breath: Secondary | ICD-10-CM

## 2015-05-01 DIAGNOSIS — I119 Hypertensive heart disease without heart failure: Secondary | ICD-10-CM | POA: Insufficient documentation

## 2015-05-01 DIAGNOSIS — R002 Palpitations: Secondary | ICD-10-CM | POA: Diagnosis not present

## 2015-05-01 DIAGNOSIS — E213 Hyperparathyroidism, unspecified: Secondary | ICD-10-CM | POA: Diagnosis not present

## 2015-05-02 ENCOUNTER — Emergency Department (HOSPITAL_COMMUNITY): Payer: No Typology Code available for payment source

## 2015-05-02 ENCOUNTER — Encounter (HOSPITAL_COMMUNITY): Payer: Self-pay | Admitting: *Deleted

## 2015-05-02 ENCOUNTER — Emergency Department (HOSPITAL_COMMUNITY)
Admission: EM | Admit: 2015-05-02 | Discharge: 2015-05-02 | Disposition: A | Discharge status: Home / Self Care | Payer: No Typology Code available for payment source | Attending: Emergency Medicine | Admitting: Emergency Medicine

## 2015-05-02 DIAGNOSIS — M545 Low back pain, unspecified: Secondary | ICD-10-CM

## 2015-05-02 DIAGNOSIS — Y9389 Activity, other specified: Secondary | ICD-10-CM | POA: Diagnosis not present

## 2015-05-02 DIAGNOSIS — M1990 Unspecified osteoarthritis, unspecified site: Secondary | ICD-10-CM | POA: Diagnosis not present

## 2015-05-02 DIAGNOSIS — G4733 Obstructive sleep apnea (adult) (pediatric): Secondary | ICD-10-CM | POA: Diagnosis not present

## 2015-05-02 DIAGNOSIS — E785 Hyperlipidemia, unspecified: Secondary | ICD-10-CM | POA: Insufficient documentation

## 2015-05-02 DIAGNOSIS — Y998 Other external cause status: Secondary | ICD-10-CM | POA: Insufficient documentation

## 2015-05-02 DIAGNOSIS — Z9889 Other specified postprocedural states: Secondary | ICD-10-CM | POA: Insufficient documentation

## 2015-05-02 DIAGNOSIS — Z792 Long term (current) use of antibiotics: Secondary | ICD-10-CM | POA: Diagnosis not present

## 2015-05-02 DIAGNOSIS — Y92410 Unspecified street and highway as the place of occurrence of the external cause: Secondary | ICD-10-CM | POA: Insufficient documentation

## 2015-05-02 DIAGNOSIS — Z7952 Long term (current) use of systemic steroids: Secondary | ICD-10-CM | POA: Diagnosis not present

## 2015-05-02 DIAGNOSIS — I10 Essential (primary) hypertension: Secondary | ICD-10-CM | POA: Diagnosis not present

## 2015-05-02 DIAGNOSIS — S0990XA Unspecified injury of head, initial encounter: Secondary | ICD-10-CM | POA: Insufficient documentation

## 2015-05-02 DIAGNOSIS — Z7951 Long term (current) use of inhaled steroids: Secondary | ICD-10-CM | POA: Insufficient documentation

## 2015-05-02 DIAGNOSIS — Z79899 Other long term (current) drug therapy: Secondary | ICD-10-CM | POA: Diagnosis not present

## 2015-05-02 DIAGNOSIS — Z8659 Personal history of other mental and behavioral disorders: Secondary | ICD-10-CM | POA: Diagnosis not present

## 2015-05-02 DIAGNOSIS — J449 Chronic obstructive pulmonary disease, unspecified: Secondary | ICD-10-CM | POA: Diagnosis not present

## 2015-05-02 DIAGNOSIS — S3992XA Unspecified injury of lower back, initial encounter: Secondary | ICD-10-CM | POA: Diagnosis not present

## 2015-05-02 DIAGNOSIS — Z9981 Dependence on supplemental oxygen: Secondary | ICD-10-CM | POA: Diagnosis not present

## 2015-05-02 DIAGNOSIS — Z7984 Long term (current) use of oral hypoglycemic drugs: Secondary | ICD-10-CM | POA: Diagnosis not present

## 2015-05-02 DIAGNOSIS — E119 Type 2 diabetes mellitus without complications: Secondary | ICD-10-CM | POA: Insufficient documentation

## 2015-05-02 DIAGNOSIS — Z7982 Long term (current) use of aspirin: Secondary | ICD-10-CM | POA: Diagnosis not present

## 2015-05-02 MED ORDER — ACETAMINOPHEN 325 MG PO TABS
650.0000 mg | ORAL_TABLET | Freq: Once | ORAL | Status: AC
  Administered 2015-05-02: 650 mg via ORAL
  Filled 2015-05-02: qty 2

## 2015-05-02 MED ORDER — IBUPROFEN 600 MG PO TABS: 600.0000 mg | ORAL_TABLET | Freq: Four times a day (QID) | ORAL | Status: DC | PRN

## 2015-05-02 MED ORDER — IBUPROFEN 200 MG PO TABS
600.0000 mg | ORAL_TABLET | Freq: Once | ORAL | Status: AC
  Administered 2015-05-02: 600 mg via ORAL
  Filled 2015-05-02: qty 3

## 2015-05-02 MED ORDER — ACETAMINOPHEN 325 MG PO TABS: 650.0000 mg | ORAL_TABLET | Freq: Four times a day (QID) | ORAL | Status: DC | PRN
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
## 2015-05-02 NOTE — ED Notes (Signed)
Patient transported to X-ray 

## 2015-05-09 ENCOUNTER — Ambulatory Visit (INDEPENDENT_AMBULATORY_CARE_PROVIDER_SITE_OTHER): Payer: Medicare Other | Admitting: Internal Medicine

## 2015-05-09 ENCOUNTER — Encounter: Payer: Self-pay | Admitting: Internal Medicine

## 2015-05-09 VITALS — BP 150/84 | HR 88 | Ht 67.0 in | Wt 196.9 lb

## 2015-05-09 DIAGNOSIS — I10 Essential (primary) hypertension: Secondary | ICD-10-CM | POA: Diagnosis not present

## 2015-05-09 DIAGNOSIS — E785 Hyperlipidemia, unspecified: Secondary | ICD-10-CM

## 2015-05-09 DIAGNOSIS — J449 Chronic obstructive pulmonary disease, unspecified: Secondary | ICD-10-CM | POA: Diagnosis not present

## 2015-05-09 DIAGNOSIS — G4733 Obstructive sleep apnea (adult) (pediatric): Secondary | ICD-10-CM

## 2015-05-09 DIAGNOSIS — Z9989 Dependence on other enabling machines and devices: Secondary | ICD-10-CM

## 2015-05-09 MED ORDER — FUROSEMIDE 20 MG PO TABS: 20.0000 mg | ORAL_TABLET | Freq: Every day | ORAL | Status: DC

## 2015-05-09 NOTE — Progress Notes (Signed)
OFFICE NOTE  Chief Complaint:   Follow-up echo  Primary Care Physician: Douglas Gravel, MD  HPI:  Douglas Hansen is a pleasant 70 year old male who is currently referred to me by Dr. Maudie Hansen. His past [name] history is extensive and includes the following: Hypertension, dyslipidemia, type 2 diabetes, COPD, obstructive sleep apnea on CPAP, GERD, mild PAD, and significant allergic symptoms. He was previously followed by Dr. Einar Hansen and has had 2 cardiac catheterizations in his past. His last heart catheterization was in 2004 which showed no significant coronary disease. He had a stress test in February 2014 which was negative for ischemia and EF of 68%. He also had ABIs for leg pain which were 0.9 on the right and 0.94 on the left. He was started on Pletal but had some shortness of breath and abnormal symptoms on it and this was discontinued. His last echocardiogram was in March 2014 showed an EF of 52% with mild to moderate right atrial enlargement, dilated IVC and elevated right atrial pressure. Recently had an upper respiratory infection concerning for COPD exacerbation. He was started on Avelox which she completed. At the end of treatment he felt some improvement in his breathing but then started to have shortness of breath again and cough. He was switched to azithromycin and placed on a prednisone Dosepak. He was also started on low-dose Lasix for chest x-ray findings of some fluid in the right fissure. Mr. Douglas Hansen says that he gained a few pounds in weight with this shortness of breath and noted that he's been on Lasix now for 10 days and that he's lost some weight and feels less fullness in his belly and in his chest. He denied any ankle edema. His shortness of breath is improving. He also denies any chest pain or anginal symptoms. He had a mild amount of orthopnea which is also resolving.   Mr. Douglas Hansen returns today for follow-up of his echocardiogram. This showed normal LV function with mild pulmonary  hypertension and elevated RVSP of 35 mmHg. This is likely secondary to COPD, but does not explain his shortness of breath. He is currently on Lasix 20 mg daily has not noted a significant improvement in his shortness of breath.  PMHx:  Past Medical History  Diagnosis Date  . Diabetes mellitus   . Hyperlipemia   . Hypertension   . Arthritis   . OSA on CPAP   . Asthma   . Pneumonia   . COPD (chronic obstructive pulmonary disease) ([place])   . PTSD (post-traumatic stress disorder)     Past Surgical History  Procedure Laterality Date  . Cardiac catheterization    . Tonsillectomy    . Right thumb surgery      FAMHx:  Family History  Problem Relation Age of Onset  . Emphysema Father   . Asthma Father   . Heart disease Father   . Heart attack Father   . Colon cancer Neg Hx   . Stroke Mother   . Heart attack Brother   . Diabetes Brother   . Heart attack Brother   . Diabetes Brother     SOCHx:   reports that he quit smoking about 30 years ago. His smoking use included Cigarettes. He has a 20 pack-year smoking history. He has never used smokeless tobacco. He reports that he does not drink alcohol or use illicit drugs.  ALLERGIES:  Allergies  Allergen Reactions  . Codeine Other (See Comments)    REACTION: GI upset  . Lipitor [  Atorvastatin]   . Pravachol [Pravastatin Sodium]     ROS: Pertinent items noted in HPI and remainder of comprehensive ROS otherwise negative.  HOME MEDS: Current Outpatient Prescriptions  Medication Sig Dispense Refill  . albuterol (PROAIR HFA) 108 (90 BASE) MCG/ACT inhaler Inhale 2 puffs into the lungs every 6 (six) hours as needed for wheezing or shortness of breath. 1 Inhaler 2  . Alpha-D-Galactosidase (BEANO PO) Take 1 tablet by mouth 4 (four) times daily as needed (gas, bloating).     Marland Kitchen amLODipine-olmesartan (AZOR) 10-40 MG per tablet Take 1 tablet by mouth daily.      . Ascorbic Acid (VITAMIN C) 1000 MG tablet Take 1,000 mg by mouth daily.        Marland Kitchen aspirin 325 MG tablet Take 325 mg by mouth daily.      . budesonide-formoterol (SYMBICORT) 80-4.5 MCG/ACT inhaler Inhale 2 puffs into the lungs 2 (two) times daily. 1 Inhaler 2  . carvedilol (COREG) 3.125 MG tablet Take 3.125 mg by mouth 2 (two) times daily.   1  . Cellulose Carmellose Sodium POWD Apply 1 application topically daily as needed (irritation). Apply to feet    . Cholecalciferol (VITAMIN D-1000 MAX ST) 1000 UNITS tablet Take 1,000 Units by mouth daily.      . clindamycin-benzoyl peroxide (BENZACLIN) gel Apply 1 application topically daily at 3 pm.     . clobetasol (TEMOVATE) 0.05 % cream Apply 1 application topically daily as needed (hands, arms, neck, thighs).     . clotrimazole (LOTRIMIN) 1 % cream Apply 1 application topically daily as needed (s). For skin condition    . Diclofenac Sodium 1.5 % SOLN Apply 12 drops topically 2 (two) times a week. Uses 12-14 drops on scalp as needed for dryness  0  . fluocinolone (VANOS) 0.01 % cream Apply 1 application topically daily.     Marland Kitchen FLUoxetine (PROZAC) 20 MG capsule Take 20 mg by mouth daily.    . fluticasone (FLONASE) 50 MCG/ACT nasal spray Place 2 sprays into both nostrils daily. 16 g 5  . furosemide (LASIX) 20 MG tablet Take 1 tablet (20 mg total) by mouth daily. 90 tablet 3  . ketoconazole (NIZORAL) 2 % cream Apply 1 application topically daily.     . metFORMIN (GLUCOPHAGE) 500 MG tablet Take 1,000 mg by mouth Twice daily.     . montelukast (SINGULAIR) 10 MG tablet Take 1 tablet (10 mg total) by mouth daily. 30 tablet 5  . Multiple Vitamins-Minerals (MULTIVITAMIN & MINERAL PO) Take 1 tablet by mouth daily.    Marland Kitchen omeprazole (PRILOSEC) 20 MG capsule Take 20 mg by mouth daily.    . Probiotic Product (ALIGN) 4 MG CAPS Take 4 mg by mouth daily.     . simvastatin (ZOCOR) 20 MG tablet Take 20 mg by mouth every evening.    . tamsulosin (FLOMAX) 0.4 MG CAPS capsule Take 0.4 mg by mouth daily.  0  . tiotropium (SPIRIVA) 18 MCG inhalation  capsule Place 18 mcg into inhaler and inhale 2 (two) times daily.     Marland Kitchen triamcinolone (KENALOG) 0.1 % cream Apply topically 2 (two) times daily.      Marland Kitchen VICTOZA 18 MG/3ML SOPN Inject 0.6 mg into the skin daily.  0   No current facility-administered medications for this visit.    LABS/IMAGING: No results found for this or any previous visit (from the past 48 hour(s)). No results found.  WEIGHTS: Wt Readings from Last 3 Encounters:  05/09/15 196 lb  14.4 oz (89.313 kg)  04/17/15 192 lb (87.091 kg)  12/11/14 195 lb (88.451 kg)    VITALS: BP 150/84 mmHg  Pulse 88  Ht 5\' 7"  (1.702 m)  Wt 196 lb 14.4 oz (89.313 kg)  BMI 30.83 kg/m2  EXAM: Deferred  EKG: Deferred  ASSESSMENT: 1. Dyspnea on exertion - normal LVEF 65-70%, DD, mild pulm HTN 2. COPD with acute exacerbation 3. Hypertension 4. Dyslipidemia 5. OSA on CPAP 6. Type 2 diabetes 7. Mildly reduced ABIs without lifestyle limiting claudication  PLAN: 1.   Mr. Vedros reports some improvement in his shortness of breath which is suspect is related to acute COPD exacerbation. There is mild pulmonary hypertension on his echocardiogram but that would not also explained shortness of breath. He may have some diastolic dysfunction but is on Lasix and appears euvolemic.   I would not recommend any further medication changes at this time. I'm happy to see him back annually for risk factor modification.  Pixie Casino, MD, [facility] Attending Cardiologist [place] C Lester Platas 05/09/2015, 7:04 PM

## 2015-05-09 NOTE — Patient Instructions (Signed)
Medication Instructions:  Your physician recommends that you continue on your current medications as directed. Please refer to the Current Medication list given to you today.   Labwork: None ordered  Testing/Procedures: None ordered  Follow-Up: Your physician wants you to follow-up in: 1 year with Dr.Hilty You will receive a reminder letter in the mail two months in advance. If you don't receive a letter, please call our office to schedule the follow-up appointment.   Any Other Special Instructions Will Be Listed Below (If Applicable).     If you need a refill on your cardiac medications before your next appointment, please call your pharmacy.

## 2015-06-11 DIAGNOSIS — E559 Vitamin D deficiency, unspecified: Secondary | ICD-10-CM | POA: Diagnosis not present

## 2015-06-11 DIAGNOSIS — E118 Type 2 diabetes mellitus with unspecified complications: Secondary | ICD-10-CM | POA: Diagnosis not present

## 2015-06-17 ENCOUNTER — Encounter: Payer: Self-pay | Admitting: Pediatrics

## 2015-06-17 ENCOUNTER — Ambulatory Visit (INDEPENDENT_AMBULATORY_CARE_PROVIDER_SITE_OTHER): Payer: Medicare Other | Admitting: Pediatrics

## 2015-06-17 VITALS — BP 112/62 | HR 88 | Resp 16

## 2015-06-17 DIAGNOSIS — J209 Acute bronchitis, unspecified: Secondary | ICD-10-CM | POA: Diagnosis not present

## 2015-06-17 DIAGNOSIS — Z9989 Dependence on other enabling machines and devices: Secondary | ICD-10-CM

## 2015-06-17 DIAGNOSIS — J4551 Severe persistent asthma with (acute) exacerbation: Secondary | ICD-10-CM | POA: Diagnosis not present

## 2015-06-17 DIAGNOSIS — J449 Chronic obstructive pulmonary disease, unspecified: Secondary | ICD-10-CM | POA: Diagnosis not present

## 2015-06-17 DIAGNOSIS — K219 Gastro-esophageal reflux disease without esophagitis: Secondary | ICD-10-CM | POA: Diagnosis not present

## 2015-06-17 DIAGNOSIS — J45901 Unspecified asthma with (acute) exacerbation: Secondary | ICD-10-CM | POA: Insufficient documentation

## 2015-06-17 DIAGNOSIS — G4733 Obstructive sleep apnea (adult) (pediatric): Secondary | ICD-10-CM | POA: Diagnosis not present

## 2015-06-17 HISTORY — DX: Unspecified asthma with (acute) exacerbation: J45.901

## 2015-06-17 MED ORDER — FLUTICASONE PROPIONATE 50 MCG/ACT NA SUSP: 2.0000 | Freq: Every day | NASAL | Status: DC

## 2015-06-17 MED ORDER — MONTELUKAST SODIUM 10 MG PO TABS: 10.0000 mg | ORAL_TABLET | Freq: Every day | ORAL | Status: DC

## 2015-06-17 MED ORDER — AZITHROMYCIN 250 MG PO TABS: ORAL_TABLET | ORAL | Status: DC

## 2015-06-17 MED ORDER — ALBUTEROL SULFATE HFA 108 (90 BASE) MCG/ACT IN AERS: 2.0000 | INHALATION_SPRAY | RESPIRATORY_TRACT | Status: DC | PRN

## 2015-06-17 MED ORDER — BUDESONIDE-FORMOTEROL FUMARATE 80-4.5 MCG/ACT IN AERO: 2.0000 | INHALATION_SPRAY | Freq: Two times a day (BID) | RESPIRATORY_TRACT | Status: DC

## 2015-06-17 NOTE — Addendum Note (Signed)
Addended by: Angelica Ran on: 06/17/2015 05:41 PM   Modules accepted: Orders

## 2015-06-17 NOTE — Progress Notes (Signed)
[address] [place] [address] Dept: [phone]  FOLLOW UP NOTE  Patient ID: Douglas Hansen, male    DOB: 02/13/1946  Age: 70 y.o. MRN: [id number] Date of Office Visit: 06/17/2015  Assessment Chief Complaint: Asthma  HPI Douglas Hansen presents for treatment of coughing and wheezing and shortness of breath since the pollen came out. He is bringing up a discolored mucus. At times he has been having nasal congestion  Current medications are Symbicort 80- 2 puffs every 12 hours, Spiriva one capsule in the morning, Pro-air 2 puffs every 4 hours if needed, montelukast 10 mg once a day, fluticasone 2 sprays per nostril once a day. His diabetes is well controlled. His other medications are outlined in the chart.    Drug Allergies:  Allergies  Allergen Reactions  . Codeine Other (See Comments)    REACTION: GI upset  . Lipitor [Atorvastatin]   . Pravachol [Pravastatin Sodium]     Physical Exam: BP 112/62 mmHg  Pulse 88  Resp 16   Physical Exam  Constitutional: He is oriented to person, place, and time. He appears well-developed and well-nourished.  HENT:  Eyes normal. Ears normal. Nose normal. Pharynx normal.  Neck: Neck supple.  Cardiovascular:  S1 and S2 normal no murmurs  Pulmonary/Chest:  Clear to percussion auscultation except for some decreased breath sounds at the bases  Lymphadenopathy:    He has no cervical adenopathy.  Neurological: He is alert and oriented to person, place, and time.  Psychiatric: He has a normal mood and affect. His behavior is normal. Judgment and thought content normal.  Vitals reviewed.   Diagnostics:  FVC 1.11 L FEV1 0.80 L. Predicted FVC 2.55 L predicted FEV1 2.00 L. After albuterol by nebulization FVC 1.42 L FEV1 1.01 L-this shows severe reduction in the forced vital capacity and FEV1 with significant improvement after albuterol  Assessment and Plan: 1. Asthma with acute exacerbation, severe persistent   2. Chronic obstructive  pulmonary disease, unspecified COPD, unspecified chronic bronchitis type   3. Acute bronchitis, unspecified organism   4. Gastroesophageal reflux disease without esophagitis   5. Obstructive sleep apnea treated with continuous positive airway pressure (CPAP)   6.      Adult-onset diabetes  Meds ordered this encounter  Medications  . budesonide-formoterol (SYMBICORT) 80-4.5 MCG/ACT inhaler    Sig: Inhale 2 puffs into the lungs 2 (two) times daily.    Dispense:  1 Inhaler    Refill:  5  . montelukast (SINGULAIR) 10 MG tablet    Sig: Take 1 tablet (10 mg total) by mouth daily.    Dispense:  30 tablet    Refill:  5  . fluticasone (FLONASE) 50 MCG/ACT nasal spray    Sig: Place 2 sprays into both nostrils daily.    Dispense:  16 g    Refill:  5  . azithromycin (ZITHROMAX) 250 MG tablet    Sig: Take Two Tablets tonight and then One tablet at night for the next Four nights.    Dispense:  6 each    Refill:  0  . albuterol (PROAIR HFA) 108 (90 Base) MCG/ACT inhaler    Sig: Inhale 2 puffs into the lungs every 4 (four) hours as needed for wheezing (or coughing.).    Dispense:  1 Inhaler    Refill:  1    Patient Instructions  Continue on your current medications Add Zithromax 250 mg tablets to take 2 tablets tonight and then 1 tablet at night for the next  4 nights Add prednisone 20 mg twice a day for 3 days, 20 mg on the fourth day, 10 mg on the fifth day Call me if you are not doing well on this treatment plan     Return in about 6 months (around 12/17/2015).    Thank you for the opportunity to care for this patient.  Please do not hesitate to contact me with questions.  Penne Lash, M.D.  [facility] [address] [place],  [address] [phone]

## 2015-06-17 NOTE — Patient Instructions (Addendum)
Continue on your current medications Add Zithromax 250 mg tablets to take 2 tablets tonight and then 1 tablet at night for the next 4 nights Add prednisone 20 mg twice a day for 3 days, 20 mg on the fourth day, 10 mg on the fifth day Call me if you are not doing well on this treatment plan

## 2015-06-19 DIAGNOSIS — M810 Age-related osteoporosis without current pathological fracture: Secondary | ICD-10-CM | POA: Diagnosis not present

## 2015-06-24 DIAGNOSIS — E1165 Type 2 diabetes mellitus with hyperglycemia: Secondary | ICD-10-CM | POA: Diagnosis not present

## 2015-06-24 DIAGNOSIS — I10 Essential (primary) hypertension: Secondary | ICD-10-CM | POA: Diagnosis not present

## 2015-07-01 DIAGNOSIS — E7800 Pure hypercholesterolemia, unspecified: Secondary | ICD-10-CM | POA: Diagnosis not present

## 2015-07-01 DIAGNOSIS — R05 Cough: Secondary | ICD-10-CM | POA: Diagnosis not present

## 2015-07-01 DIAGNOSIS — I10 Essential (primary) hypertension: Secondary | ICD-10-CM | POA: Diagnosis not present

## 2015-07-01 DIAGNOSIS — E1165 Type 2 diabetes mellitus with hyperglycemia: Secondary | ICD-10-CM | POA: Diagnosis not present

## 2015-09-09 DIAGNOSIS — J209 Acute bronchitis, unspecified: Secondary | ICD-10-CM | POA: Diagnosis not present

## 2015-09-09 DIAGNOSIS — J189 Pneumonia, unspecified organism: Secondary | ICD-10-CM | POA: Diagnosis not present

## 2015-09-24 DIAGNOSIS — E785 Hyperlipidemia, unspecified: Secondary | ICD-10-CM | POA: Diagnosis not present

## 2015-09-24 DIAGNOSIS — J189 Pneumonia, unspecified organism: Secondary | ICD-10-CM | POA: Diagnosis not present

## 2015-09-24 DIAGNOSIS — E1165 Type 2 diabetes mellitus with hyperglycemia: Secondary | ICD-10-CM | POA: Diagnosis not present

## 2015-09-24 DIAGNOSIS — I10 Essential (primary) hypertension: Secondary | ICD-10-CM | POA: Diagnosis not present

## 2015-09-28 ENCOUNTER — Ambulatory Visit (HOSPITAL_COMMUNITY)
Admission: EM | Admit: 2015-09-28 | Discharge: 2015-09-28 | Disposition: A | Discharge status: Home / Self Care | Payer: Medicare Other | Attending: Family Medicine | Admitting: Family Medicine

## 2015-09-28 ENCOUNTER — Encounter (HOSPITAL_COMMUNITY): Payer: Self-pay | Admitting: *Deleted

## 2015-09-28 DIAGNOSIS — J441 Chronic obstructive pulmonary disease with (acute) exacerbation: Secondary | ICD-10-CM

## 2015-09-28 HISTORY — DX: Contact with and (suspected) exposure to other hazardous, chiefly nonmedicinal, chemicals: Z77.098

## 2015-09-28 HISTORY — DX: Contact with and (suspected) exposure to other war theater: Z77.39

## 2015-09-28 MED ORDER — LEVOFLOXACIN 500 MG PO TABS: 500.0000 mg | ORAL_TABLET | Freq: Every day | ORAL | Status: DC

## 2015-09-28 MED ORDER — SODIUM CHLORIDE 0.9 % IN NEBU
INHALATION_SOLUTION | RESPIRATORY_TRACT | Status: AC
  Filled 2015-09-28: qty 3

## 2015-09-28 MED ORDER — IPRATROPIUM-ALBUTEROL 0.5-2.5 (3) MG/3ML IN SOLN
RESPIRATORY_TRACT | Status: AC
  Filled 2015-09-28: qty 3

## 2015-09-28 MED ORDER — IPRATROPIUM-ALBUTEROL 0.5-2.5 (3) MG/3ML IN SOLN
3.0000 mL | Freq: Once | RESPIRATORY_TRACT | Status: AC
  Administered 2015-09-28: 3 mL via RESPIRATORY_TRACT

## 2015-09-28 NOTE — ED Provider Notes (Signed)
CSN: [id number]     Arrival date & time 09/28/15  1238 History   First MD Initiated Contact with Patient 09/28/15 1357     Chief Complaint  Patient presents with  . Cough  . Nasal Congestion   (Consider location/radiation/quality/duration/timing/severity/associated sxs/prior Treatment) HPI  70 y.o. bm presents today with the above complaint.  States that 09 September 2015 he was diagnosed with pneumonia by PCP office and started on zpack.  States that he did get better with this and went back for repeat CXR and was told that the pneumonia didn't completely clear up.  Was not started on another antibiotic.  A few days ago he started having a sore throat and this led to a productive cough with green sputum.  Has had sob with and without exertion.  Denies cp, sob, fever, chills, abd pain, NV. States that his wife has been sick.  Has done home neb treatments and is compliant with using his medications for COPD.    Past Medical History  Diagnosis Date  . Diabetes mellitus   . Hyperlipemia   . Hypertension   . Arthritis   . OSA on CPAP   . Asthma   . Pneumonia   . COPD (chronic obstructive pulmonary disease) ([place])   . PTSD (post-traumatic stress disorder)   . Agent orange exposure    Past Surgical History  Procedure Laterality Date  . Cardiac catheterization    . Tonsillectomy    . Right thumb surgery     Family History  Problem Relation Age of Onset  . Emphysema Father   . Asthma Father   . Heart disease Father   . Heart attack Father   . Colon cancer Neg Hx   . Stroke Mother   . Heart attack Brother   . Diabetes Brother   . Heart attack Brother   . Diabetes Brother    Social History  Substance Use Topics  . Smoking status: Former Smoker -- 1.00 packs/day for 20 years    Types: Cigarettes    Quit date: 03/09/1985  . Smokeless tobacco: Never Used  . Alcohol Use: No    Review of Systems  Constitutional: Negative.   HENT: Positive for congestion and sore throat. Negative for  ear pain, hearing loss and sinus pressure.   Eyes: Negative.   Respiratory: Positive for cough, shortness of breath and wheezing.   Cardiovascular: Negative.   Gastrointestinal: Negative.   Genitourinary: Negative.   Musculoskeletal: Negative.   Skin: Negative.   Neurological: Negative.   Psychiatric/Behavioral: Negative.     Allergies  Codeine; Lipitor; and Pravachol  Home Medications   Prior to Admission medications   Medication Sig Start Date End Date Taking? Authorizing Provider  albuterol (PROAIR HFA) 108 (90 Base) MCG/ACT inhaler Inhale 2 puffs into the lungs every 4 (four) hours as needed for wheezing (or coughing.). 06/17/15  Yes Charlies Silvers, MD  Alpha-D-Galactosidase (BEANO PO) Take 1 tablet by mouth 4 (four) times daily as needed (gas, bloating).    Yes Historical Provider, MD  amLODipine-olmesartan (AZOR) 10-40 MG per tablet Take 1 tablet by mouth daily.     Yes Historical Provider, MD  Ascorbic Acid (VITAMIN C) 1000 MG tablet Take 1,000 mg by mouth daily. Reported on 06/17/2015   Yes Historical Provider, MD  aspirin 325 MG tablet Take 325 mg by mouth daily.     Yes Historical Provider, MD  budesonide-formoterol (SYMBICORT) 80-4.5 MCG/ACT inhaler Inhale 2 puffs into the lungs 2 (two) times  daily. 06/17/15  Yes Charlies Silvers, MD  carvedilol (COREG) 3.125 MG tablet Take 3.125 mg by mouth 2 (two) times daily.  11/29/14  Yes Historical Provider, MD  Cellulose Carmellose Sodium POWD Apply 1 application topically daily as needed (irritation). Apply to feet   Yes Historical Provider, MD  Cholecalciferol (VITAMIN D-1000 MAX ST) 1000 UNITS tablet Take 1,000 Units by mouth daily. Reported on 06/17/2015   Yes Historical Provider, MD  clindamycin-benzoyl peroxide (BENZACLIN) gel Apply 1 application topically daily at 3 pm.    Yes Historical Provider, MD  clobetasol (TEMOVATE) 0.05 % cream Apply 1 application topically daily as needed (hands, arms, neck, thighs).    Yes Historical  Provider, MD  clotrimazole (LOTRIMIN) 1 % cream Apply 1 application topically daily as needed (s). For skin condition   Yes Historical Provider, MD  Diclofenac Sodium 1.5 % SOLN Apply 12 drops topically 2 (two) times a week. Uses 12-14 drops on scalp as needed for dryness 10/17/14  Yes Historical Provider, MD  fluocinolone (VANOS) 0.01 % cream Apply 1 application topically daily.    Yes Historical Provider, MD  fluticasone (FLONASE) 50 MCG/ACT nasal spray Place 2 sprays into both nostrils daily. 06/17/15  Yes Charlies Silvers, MD  furosemide (LASIX) 20 MG tablet Take 1 tablet (20 mg total) by mouth daily. 05/09/15  Yes Pixie Casino, MD  ketoconazole (NIZORAL) 2 % cream Apply 1 application topically daily.    Yes Historical Provider, MD  metFORMIN (GLUCOPHAGE) 500 MG tablet Take 1,000 mg by mouth Twice daily.  07/28/10  Yes Historical Provider, MD  montelukast (SINGULAIR) 10 MG tablet Take 1 tablet (10 mg total) by mouth daily. 06/17/15  Yes Charlies Silvers, MD  Multiple Vitamins-Minerals (MULTIVITAMIN & MINERAL PO) Take 1 tablet by mouth daily. Reported on 06/17/2015   Yes Historical Provider, MD  omeprazole (PRILOSEC) 20 MG capsule Take 20 mg by mouth daily.   Yes Historical Provider, MD  Probiotic Product (ALIGN) 4 MG CAPS Take 4 mg by mouth daily.    Yes Historical Provider, MD  Sertraline HCl (ZOLOFT PO) Take by mouth.   Yes Historical Provider, MD  simvastatin (ZOCOR) 20 MG tablet Take 20 mg by mouth every evening.   Yes Historical Provider, MD  tamsulosin (FLOMAX) 0.4 MG CAPS capsule Take 0.4 mg by mouth daily. 05/01/15  Yes Historical Provider, MD  tiotropium (SPIRIVA) 18 MCG inhalation capsule Place 18 mcg into inhaler and inhale 2 (two) times daily.    Yes Historical Provider, MD  triamcinolone (KENALOG) 0.1 % cream Apply topically 2 (two) times daily. Reported on 06/17/2015   Yes Historical Provider, MD  VICTOZA 18 MG/3ML SOPN Inject 0.6 mg into the skin daily. 12/24/14  Yes Historical Provider,  MD  azithromycin (ZITHROMAX) 250 MG tablet Take Two Tablets tonight and then One tablet at night for the next Four nights. 06/17/15   Charlies Silvers, MD  FLUoxetine (PROZAC) 20 MG capsule Take 20 mg by mouth daily.    Historical Provider, MD  fluticasone (FLONASE) 50 MCG/ACT nasal spray Place 2 sprays into both nostrils daily. 01/07/15   Charlies Silvers, MD   Meds Ordered and Administered this Visit   Medications  ipratropium-albuterol (DUONEB) 0.5-2.5 (3) MG/3ML nebulizer solution 3 mL (not administered)    BP 149/80 mmHg  Pulse 84  Temp(Src) 98.1 F (36.7 C) (Oral)  Resp 12  SpO2 95% No data found.   Physical Exam  Constitutional: He is oriented to person, place, and  time. He appears well-developed and well-nourished. No distress.  HENT:  Head: Normocephalic and atraumatic.  Nose: Nose normal.  Mouth/Throat: Oropharynx is clear and moist. No oropharyngeal exudate.  Eyes: EOM are normal. Pupils are equal, round, and reactive to light.  Neck: Normal range of motion.  Cardiovascular: Normal rate.   No murmur heard. Pulmonary/Chest: Effort normal. No respiratory distress. He has no rales. He exhibits no tenderness.  Abdominal: Soft. Bowel sounds are normal. He exhibits no distension. There is no tenderness.  Musculoskeletal: Normal range of motion. He exhibits no tenderness.  No lower extremity edema.  bilat calves nontender.    Lymphadenopathy:    He has no cervical adenopathy.  Neurological: He is alert and oriented to person, place, and time.  Skin: Skin is warm and dry.  Psychiatric: He has a normal mood and affect.    ED Course  Procedures (including critical care time)  Labs Review Labs Reviewed - No data to display  Imaging Review No results found.       MDM   1. COPD with exacerbation ([place])   patient was give a duoneb treatment today.  Script for levaquin 500mg  po daily x 7 days.  Advised to follow up with PCP Dr [name] next week for recheck.  If  symptoms worsen over the weekend he should return back to see [place] or go to the ED.  Continue using all of his current home home medications.  All questions answered.  Medical decision discussed with my attending.      Lanae Crumbly, PA-C 09/28/15 1456

## 2015-09-28 NOTE — Discharge Instructions (Signed)
° °  Chronic Obstructive Pulmonary Disease Exacerbation Chronic obstructive pulmonary disease (COPD) is a common lung problem. In COPD, the flow of air from the lungs is limited. COPD exacerbations are times that breathing gets worse and you need extra treatment. Without treatment they can be life threatening. If they happen often, your lungs can become more damaged. If your COPD gets worse, your doctor may treat you with:  Medicines.  Oxygen.  Different ways to clear your airway, such as using a mask. HOME CARE  Do not smoke.  Avoid tobacco smoke and other things that bother your lungs.  If given, take your antibiotic medicine as told. Finish the medicine even if you start to feel better.  Only take medicines as told by your doctor.  Drink enough fluids to keep your pee (urine) clear or pale yellow (unless your doctor has told you not to).  Use a cool mist machine (vaporizer).  If you use oxygen or a machine that turns liquid medicine into a mist (nebulizer), continue to use them as told.  Keep up with shots (vaccinations) as told by your doctor.  Exercise regularly.  Eat healthy foods.  Keep all doctor visits as told. GET HELP RIGHT AWAY IF:  You are very short of breath and it gets worse.  You have trouble talking.  You have bad chest pain.  You have blood in your spit (sputum).  You have a fever.  You keep throwing up (vomiting).  You feel weak, or you pass out (faint).  You feel confused.  You keep getting worse. MAKE SURE YOU:  Understand these instructions.  Will watch your condition.  Will get help right away if you are not doing well or get worse.  MAKE SURE YOU CALL DR Jeneen Rinks [facility] OFFICE NEXT WEEK TO SCHEDULE FOLLOW UP APPOINTMENT.  IF THERE ARE ANY WORSENING ISSUE OVER THE WEEKEND RETURN TO THE URGENT CARE OR GO TO THE EMERGENCY ROOM FOR EVALUATION.    This information is not intended to replace advice given to you by your health care provider.  Make sure you discuss any questions you have with your health care provider.   Document Released: 02/12/2011 Document Revised: 03/16/2014 Document Reviewed: 10/28/2012 Elsevier Interactive Patient Education [employer].

## 2015-09-28 NOTE — ED Notes (Signed)
Pt diagnosed with pneumonia on 7/3 - was given a "shot and Z-pak" with improvement.  Had f/u appt last wk and was told had "just a hair of pneumonia left" on XR, but pt needed no further tx.  Over past 3-4 days has had productive worsening cough, head congestion, and runny nose.  Has been feeling some wheezing - ProAir helps - last dose last night.  SaO2 normally around 97% per pt.  Denies any fevers.

## 2015-10-02 DIAGNOSIS — I10 Essential (primary) hypertension: Secondary | ICD-10-CM | POA: Diagnosis not present

## 2015-10-02 DIAGNOSIS — E1165 Type 2 diabetes mellitus with hyperglycemia: Secondary | ICD-10-CM | POA: Diagnosis not present

## 2015-10-02 DIAGNOSIS — E785 Hyperlipidemia, unspecified: Secondary | ICD-10-CM | POA: Diagnosis not present

## 2015-10-07 DIAGNOSIS — F329 Major depressive disorder, single episode, unspecified: Secondary | ICD-10-CM | POA: Diagnosis not present

## 2015-10-07 DIAGNOSIS — I10 Essential (primary) hypertension: Secondary | ICD-10-CM | POA: Diagnosis not present

## 2015-10-07 DIAGNOSIS — J449 Chronic obstructive pulmonary disease, unspecified: Secondary | ICD-10-CM | POA: Diagnosis not present

## 2015-10-07 DIAGNOSIS — E785 Hyperlipidemia, unspecified: Secondary | ICD-10-CM | POA: Diagnosis not present

## 2015-11-06 ENCOUNTER — Other Ambulatory Visit: Payer: Self-pay

## 2015-12-16 ENCOUNTER — Encounter: Payer: Self-pay | Admitting: Allergy and Immunology

## 2015-12-16 ENCOUNTER — Encounter (INDEPENDENT_AMBULATORY_CARE_PROVIDER_SITE_OTHER): Payer: Self-pay

## 2015-12-16 ENCOUNTER — Ambulatory Visit (INDEPENDENT_AMBULATORY_CARE_PROVIDER_SITE_OTHER): Payer: Medicare Other | Admitting: Allergy and Immunology

## 2015-12-16 ENCOUNTER — Ambulatory Visit: Payer: Federal, State, Local not specified - PPO | Admitting: Pediatrics

## 2015-12-16 VITALS — BP 130/76 | HR 70 | Resp 18

## 2015-12-16 DIAGNOSIS — J449 Chronic obstructive pulmonary disease, unspecified: Secondary | ICD-10-CM

## 2015-12-16 DIAGNOSIS — J0110 Acute frontal sinusitis, unspecified: Secondary | ICD-10-CM

## 2015-12-16 DIAGNOSIS — J019 Acute sinusitis, unspecified: Secondary | ICD-10-CM | POA: Insufficient documentation

## 2015-12-16 DIAGNOSIS — J45901 Unspecified asthma with (acute) exacerbation: Secondary | ICD-10-CM | POA: Diagnosis not present

## 2015-12-16 DIAGNOSIS — J209 Acute bronchitis, unspecified: Secondary | ICD-10-CM | POA: Diagnosis not present

## 2015-12-16 DIAGNOSIS — K219 Gastro-esophageal reflux disease without esophagitis: Secondary | ICD-10-CM

## 2015-12-16 MED ORDER — AZITHROMYCIN 250 MG PO TABS: ORAL_TABLET | ORAL | 0 refills | Status: DC

## 2015-12-16 MED ORDER — MONTELUKAST SODIUM 10 MG PO TABS: 10.0000 mg | ORAL_TABLET | Freq: Every day | ORAL | 5 refills | Status: DC

## 2015-12-16 MED ORDER — PREDNISONE 1 MG PO TABS: 10.0000 mg | ORAL_TABLET | Freq: Every day | ORAL | Status: DC

## 2015-12-16 MED ORDER — BUDESONIDE-FORMOTEROL FUMARATE 160-4.5 MCG/ACT IN AERO: 2.0000 | INHALATION_SPRAY | Freq: Two times a day (BID) | RESPIRATORY_TRACT | 5 refills | Status: DC

## 2015-12-16 MED ORDER — FLUTICASONE PROPIONATE 50 MCG/ACT NA SUSP: 2.0000 | Freq: Every day | NASAL | 5 refills | Status: DC

## 2015-12-16 NOTE — Assessment & Plan Note (Signed)
   Prednisone has been provided (as above).  Continue fluticasone, 2 sprays per nostril 1-2 times daily as needed.  I have also recommended nasal saline spray (i.e., Simply Saline) or nasal saline lavage (i.e., NeilMed) as needed prior to medicated nasal sprays.  For thick post nasal drainage, add guaifenesin [phone] mg (Mucinex)  twice daily as needed with adequate hydration as discussed.

## 2015-12-16 NOTE — Progress Notes (Signed)
Follow-up Note  RE: Douglas Hansen MRN: [id number] DOB: 09/29/1945 Date of Office Visit: 12/16/2015  Primary care provider: [name], MD Referring provider: [name], MD  History of present illness: Douglas Hansen is a 70 y.o. male asthma/COPD, allergic rhinitis, and gastroesophageal reflux disease presenting today for sick visit.  He reports that over the past week he has been experiencing frequent coughing and wheezing.  In addition, he has been experiencing nasal congestion, postnasal drainage, and sinus pressure.  He has had "on and off" chills but no fever that he is aware of.  His mucus has been clear at times and somewhat discolored at other times.  He believes that the exacerbation of his upper and lower respiratory symptoms were triggered by the recent weather changes.  He currently takes Symbicort 80/4.5 g, 2 inhalations twice a day, and montelukast 10 mg daily at bedtime.  He currently does not use a spacer device with HFA inhalers.   Assessment and plan: Asthma with acute exacerbation  Prednisone has been provided, 40 mg x3 days, 20 mg x1 day, 10 mg x1 day, then stop.  A prescription has been provided for Symbicort (budesonide/formoterol) 160/4.5 g,  2 inhalations twice a day. To maximize pulmonary deposition, a spacer has been provided along with instructions for its proper administration with an HFA inhaler.  Continue montelukast 10 mg daily bedtime and albuterol HFA, 1-2 inhalations every 4-6 hours as needed.  The patient has been asked to contact me if his symptoms persist or progress. Otherwise, he may return for follow up in 4 months.  Acute bronchitis  Treatment plan as outlined above.  A prescription has been provided for azithromycin, 500 mg on day 1 and 250 mg on days 2 through 5.  Gastroesophageal reflux disease without esophagitis  Continue appropriate reflux list on modifications and omeprazole as prescribed.  COPD (chronic obstructive pulmonary  disease)  Treatment plan as outlined above.  A prescription has been provided for a flutter valve.  Acute sinusitis  Prednisone has been provided (as above).  Continue fluticasone, 2 sprays per nostril 1-2 times daily as needed.  I have also recommended nasal saline spray (i.e., Simply Saline) or nasal saline lavage (i.e., NeilMed) as needed prior to medicated nasal sprays.  For thick post nasal drainage, add guaifenesin [phone] mg (Mucinex)  twice daily as needed with adequate hydration as discussed.   Meds ordered this encounter  Medications  . montelukast (SINGULAIR) 10 MG tablet    Sig: Take 1 tablet (10 mg total) by mouth daily.    Dispense:  30 tablet    Refill:  5  . azithromycin (ZITHROMAX) 250 MG tablet    Sig: TAKE 2 TABLETS ON DAY ONE THEN 1 TABLET ON DAYS 2-5.    Dispense:  6 each    Refill:  0  . fluticasone (FLONASE) 50 MCG/ACT nasal spray    Sig: Place 2 sprays into both nostrils daily.    Dispense:  16 g    Refill:  5  . budesonide-formoterol (SYMBICORT) 160-4.5 MCG/ACT inhaler    Sig: Inhale 2 puffs into the lungs 2 (two) times daily.    Dispense:  1 Inhaler    Refill:  5  . predniSONE (DELTASONE) tablet 10 mg    Diagnostics: Spirometry reveals an FVC of 1.55 L (53% predicted) and an FEV1 of 1.11 L (49% predicted) without significant postbronchodilator improvement.  He has demonstrated full reversibility on previous studies.  Please see scanned spirometry results for details.  Physical examination: Blood pressure 130/76, pulse 70, resp. rate 18.  General: Alert, interactive, in no acute distress. HEENT: TMs pearly gray, turbinates edematous with thick discharge, post-pharynx erythematous. Neck: Supple without lymphadenopathy. Lungs: Mildly decreased breath sounds bilaterally without wheezing, rhonchi or rales. CV: Normal S1, S2 without murmurs. Skin: Warm and dry, without lesions or rashes.  The following portions of the patient's history were  reviewed and updated as appropriate: allergies, current medications, past family history, past medical history, past social history, past surgical history and problem list.    Medication List       Accurate as of 12/16/15  4:45 PM. Always use your most recent med list.          albuterol 108 (90 Base) MCG/ACT inhaler Commonly known as:  PROAIR HFA Inhale 2 puffs into the lungs every 4 (four) hours as needed for wheezing (or coughing.).   ALIGN 4 MG Caps Take 4 mg by mouth daily.   aspirin 325 MG tablet Take 325 mg by mouth daily.   azithromycin 250 MG tablet Commonly known as:  ZITHROMAX Take Two Tablets tonight and then One tablet at night for the next Four nights.   azithromycin 250 MG tablet Commonly known as:  ZITHROMAX TAKE 2 TABLETS ON DAY ONE THEN 1 TABLET ON DAYS 2-5.   AZOR 10-40 MG tablet Generic drug:  amLODipine-olmesartan Take 1 tablet by mouth daily.   BEANO PO Take 1 tablet by mouth 4 (four) times daily as needed (gas, bloating).   budesonide-formoterol 80-4.5 MCG/ACT inhaler Commonly known as:  SYMBICORT Inhale 2 puffs into the lungs 2 (two) times daily.   budesonide-formoterol 160-4.5 MCG/ACT inhaler Commonly known as:  SYMBICORT Inhale 2 puffs into the lungs 2 (two) times daily.   carvedilol 3.125 MG tablet Commonly known as:  COREG Take 3.125 mg by mouth 2 (two) times daily.   Cellulose Carmellose Sodium Powd Apply 1 application topically daily as needed (irritation). Apply to feet   clindamycin-benzoyl peroxide gel Commonly known as:  BENZACLIN Apply 1 application topically daily at 3 pm.   clobetasol cream 0.05 % Commonly known as:  TEMOVATE Apply 1 application topically daily as needed (hands, arms, neck, thighs).   clotrimazole 1 % cream Commonly known as:  LOTRIMIN Apply 1 application topically daily as needed (s). For skin condition   Diclofenac Sodium 1.5 % Soln Apply 12 drops topically 2 (two) times a week. Uses 12-14 drops on  scalp as needed for dryness   fluocinolone 0.01 % cream Commonly known as:  VANOS Apply 1 application topically daily.   FLUoxetine 20 MG capsule Commonly known as:  PROZAC Take 20 mg by mouth daily.   fluticasone 50 MCG/ACT nasal spray Commonly known as:  FLONASE Place 2 sprays into both nostrils daily.   fluticasone 50 MCG/ACT nasal spray Commonly known as:  FLONASE Place 2 sprays into both nostrils daily.   furosemide 20 MG tablet Commonly known as:  LASIX Take 1 tablet (20 mg total) by mouth daily.   ketoconazole 2 % cream Commonly known as:  NIZORAL Apply 1 application topically daily.   levofloxacin 500 MG tablet Commonly known as:  LEVAQUIN Take 1 tablet (500 mg total) by mouth daily.   metFORMIN 500 MG tablet Commonly known as:  GLUCOPHAGE Take 1,000 mg by mouth Twice daily.   montelukast 10 MG tablet Commonly known as:  SINGULAIR Take 1 tablet (10 mg total) by mouth daily.   MULTIVITAMIN & MINERAL PO Take 1 tablet  by mouth daily. Reported on 06/17/2015   omeprazole 20 MG capsule Commonly known as:  PRILOSEC Take 20 mg by mouth daily.   simvastatin 20 MG tablet Commonly known as:  ZOCOR Take 20 mg by mouth every evening.   tamsulosin 0.4 MG Caps capsule Commonly known as:  FLOMAX Take 0.4 mg by mouth daily.   tiotropium 18 MCG inhalation capsule Commonly known as:  SPIRIVA Place 18 mcg into inhaler and inhale 2 (two) times daily.   triamcinolone cream 0.1 % Commonly known as:  KENALOG Apply topically 2 (two) times daily. Reported on 06/17/2015   VICTOZA 18 MG/3ML Sopn Generic drug:  liraglutide Inject 0.6 mg into the skin daily.   vitamin C 1000 MG tablet Take 1,000 mg by mouth daily. Reported on 06/17/2015   VITAMIN D-1000 MAX ST 1000 units tablet Generic drug:  Cholecalciferol Take 1,000 Units by mouth daily. Reported on 06/17/2015   ZOLOFT PO Take by mouth.       Allergies  Allergen Reactions  . Codeine Other (See Comments)     REACTION: GI upset  . Lipitor [Atorvastatin]   . Pravachol [Pravastatin Sodium]    Review of systems: Review of systems negative except as noted in HPI / PMHx or noted below: Constitutional: Negative.  HENT: Negative.   Eyes: Negative.  Respiratory: Negative.   Cardiovascular: Negative.  Gastrointestinal: Negative.  Genitourinary: Negative.  Musculoskeletal: Negative.  Neurological: Negative.  Endo/Heme/Allergies: Negative.  Cutaneous: Negative.  Past Medical History:  Diagnosis Date  . Agent orange exposure   . Arthritis   . Asthma   . COPD (chronic obstructive pulmonary disease) ([place])   . Diabetes mellitus   . Hyperlipemia   . Hypertension   . OSA on CPAP   . Pneumonia   . PTSD (post-traumatic stress disorder)     Family History  Problem Relation Age of Onset  . Emphysema Father   . Asthma Father   . Heart disease Father   . Heart attack Father   . Stroke Mother   . Heart attack Brother   . Diabetes Brother   . Heart attack Brother   . Diabetes Brother   . Colon cancer Neg Hx     Social History   Social History  . Marital status: Married    Spouse name: N/A  . Number of children: 5  . Years of education: N/A   Occupational History  . Retired    Social History Main Topics  . Smoking status: Former Smoker    Packs/day: 1.00    Years: 20.00    Types: Cigarettes    Quit date: 03/09/1985  . Smokeless tobacco: Never Used  . Alcohol use No  . Drug use: No  . Sexual activity: Not on file   Other Topics Concern  . Not on file   Social History Narrative   Epworth Sleepiness Scale = 6 (as of 04/17/2015)    I appreciate the opportunity to take part in Finnian's care. Please do not hesitate to contact me with questions.  Sincerely,   R. Edgar Frisk, MD

## 2015-12-16 NOTE — Assessment & Plan Note (Signed)
   Treatment plan as outlined above.  A prescription has been provided for a flutter valve.

## 2015-12-16 NOTE — Assessment & Plan Note (Signed)
   Continue appropriate reflux list on modifications and omeprazole as prescribed. 

## 2015-12-16 NOTE — Assessment & Plan Note (Signed)
   Treatment plan as outlined above.  A prescription has been provided for azithromycin, 500 mg on day 1 and 250 mg on days 2 through 5.

## 2015-12-16 NOTE — Assessment & Plan Note (Signed)
   Prednisone has been provided, 40 mg x3 days, 20 mg x1 day, 10 mg x1 day, then stop.  A prescription has been provided for Symbicort (budesonide/formoterol) 160/4.5 g, 2 inhalations twice a day. To maximize pulmonary deposition, a spacer has been provided along with instructions for its proper administration with an HFA inhaler.  Continue montelukast 10 mg daily bedtime and albuterol HFA, 1-2 inhalations every 4-6 hours as needed.  The patient has been asked to contact me if his symptoms persist or progress. Otherwise, he may return for follow up in 4 months.

## 2015-12-16 NOTE — Patient Instructions (Addendum)
Asthma with acute exacerbation  Prednisone has been provided, 40 mg x3 days, 20 mg x1 day, 10 mg x1 day, then stop.  A prescription has been provided for Symbicort (budesonide/formoterol) 160/4.5 g,  2 inhalations twice a day. To maximize pulmonary deposition, a spacer has been provided along with instructions for its proper administration with an HFA inhaler.  Continue montelukast 10 mg daily bedtime and albuterol HFA, 1-2 inhalations every 4-6 hours as needed.  The patient has been asked to contact me if his symptoms persist or progress. Otherwise, he may return for follow up in 4 months.  Acute bronchitis  Treatment plan as outlined above.  A prescription has been provided for azithromycin, 500 mg on day 1 and 250 mg on days 2 through 5.  Gastroesophageal reflux disease without esophagitis  Continue appropriate reflux list on modifications and omeprazole as prescribed.  COPD (chronic obstructive pulmonary disease)  Treatment plan as outlined above.  A prescription has been provided for a flutter valve.  Acute sinusitis  Prednisone has been provided (as above).  Continue fluticasone, 2 sprays per nostril 1-2 times daily as needed.  I have also recommended nasal saline spray (i.e., Simply Saline) or nasal saline lavage (i.e., NeilMed) as needed prior to medicated nasal sprays.  For thick post nasal drainage, add guaifenesin [phone] mg (Mucinex)  twice daily as needed with adequate hydration as discussed.   Return in about 4 months (around 04/17/2016), or if symptoms worsen or fail to improve.

## 2015-12-19 NOTE — Addendum Note (Signed)
Addended by: Hartley Barefoot on: 12/19/2015 06:15 PM   Modules accepted: Orders

## 2016-03-27 DIAGNOSIS — I10 Essential (primary) hypertension: Secondary | ICD-10-CM | POA: Diagnosis not present

## 2016-03-27 DIAGNOSIS — E785 Hyperlipidemia, unspecified: Secondary | ICD-10-CM | POA: Diagnosis not present

## 2016-03-27 DIAGNOSIS — E1165 Type 2 diabetes mellitus with hyperglycemia: Secondary | ICD-10-CM | POA: Diagnosis not present

## 2016-03-27 DIAGNOSIS — Z125 Encounter for screening for malignant neoplasm of prostate: Secondary | ICD-10-CM | POA: Diagnosis not present

## 2016-03-30 DIAGNOSIS — E118 Type 2 diabetes mellitus with unspecified complications: Secondary | ICD-10-CM | POA: Diagnosis not present

## 2016-03-30 DIAGNOSIS — G4733 Obstructive sleep apnea (adult) (pediatric): Secondary | ICD-10-CM | POA: Diagnosis not present

## 2016-03-30 DIAGNOSIS — E785 Hyperlipidemia, unspecified: Secondary | ICD-10-CM | POA: Diagnosis not present

## 2016-03-30 DIAGNOSIS — I10 Essential (primary) hypertension: Secondary | ICD-10-CM | POA: Diagnosis not present

## 2016-04-20 ENCOUNTER — Encounter: Payer: Self-pay | Admitting: Allergy and Immunology

## 2016-04-20 ENCOUNTER — Ambulatory Visit (INDEPENDENT_AMBULATORY_CARE_PROVIDER_SITE_OTHER): Payer: Medicare Other | Admitting: Allergy and Immunology

## 2016-04-20 ENCOUNTER — Encounter (INDEPENDENT_AMBULATORY_CARE_PROVIDER_SITE_OTHER): Payer: Self-pay

## 2016-04-20 VITALS — BP 152/80 | HR 81 | Temp 98.2°F | Resp 20 | Ht 65.0 in | Wt 188.8 lb

## 2016-04-20 DIAGNOSIS — J3089 Other allergic rhinitis: Secondary | ICD-10-CM | POA: Diagnosis not present

## 2016-04-20 DIAGNOSIS — J449 Chronic obstructive pulmonary disease, unspecified: Secondary | ICD-10-CM | POA: Diagnosis not present

## 2016-04-20 DIAGNOSIS — R05 Cough: Secondary | ICD-10-CM

## 2016-04-20 DIAGNOSIS — R059 Cough, unspecified: Secondary | ICD-10-CM

## 2016-04-20 MED ORDER — MONTELUKAST SODIUM 10 MG PO TABS: 10.0000 mg | ORAL_TABLET | Freq: Every day | ORAL | 5 refills | Status: DC

## 2016-04-20 MED ORDER — FLUTTER DEVI: 0 refills | Status: AC

## 2016-04-20 MED ORDER — FLUTICASONE PROPIONATE 50 MCG/ACT NA SUSP: 2.0000 | Freq: Every day | NASAL | 5 refills | Status: DC

## 2016-04-20 MED ORDER — TIOTROPIUM BROMIDE MONOHYDRATE 18 MCG IN CAPS: 18.0000 ug | ORAL_CAPSULE | Freq: Every day | RESPIRATORY_TRACT | 3 refills | Status: DC

## 2016-04-20 MED ORDER — BUDESONIDE-FORMOTEROL FUMARATE 160-4.5 MCG/ACT IN AERO: 2.0000 | INHALATION_SPRAY | Freq: Two times a day (BID) | RESPIRATORY_TRACT | 5 refills | Status: DC

## 2016-04-20 NOTE — Progress Notes (Signed)
Follow-up Note  RE: Douglas Hansen MRN: [id number] DOB: April 07, 1945 Date of Office Visit: 04/20/2016  Primary care provider: [name], MD Referring provider: [name], MD  History of present illness: Douglas Hansen is a 71 y.o. male with persistent asthma/COPD and allergic rhinitis presenting today for follow up.  He was last seen in this clinic in September 2017.  He reports that his asthma/COPD had been well relatively well-controlled taking Symbicort 2 inhalations via spacer device twice a day, however recently he has been exposed to paint fume in his home and he has experienced some mild wheezing over the past few days.  In addition, he has been experiencing some rhinorrhea and postnasal drainage.  He currently uses fluticasone nasal spray sporadically.  His wife has a flutter valve for coughing and he requests one as well.   Assessment and plan: Asthma with COPD ([place])  Continue Symbicort 160/4.5 g, 2 inhalations via spacer device twice a day, Spiriva 18 g daily, montelukast 10 mg daily at bedtime, and albuterol HFA, 1-2 inhalations every 4-6 hours as needed.  I have encouraged him to avoid paint fumes to the degree that he is able.  He will call me if his symptoms persist or progress.  Other allergic rhinitis  Continue appropriate allergen avoidance measures and montelukast 10 mg daily at bedtime.  Given his current symptoms, I have encouraged more frequent use of fluticasone nasal spray as well as nasal saline lavage (NeilMed) prior to medicated nasal sprays and as needed.   Meds ordered this encounter  Medications  . Respiratory Therapy Supplies (FLUTTER) DEVI    Sig: Use as directed    Dispense:  1 each    Refill:  0  . montelukast (SINGULAIR) 10 MG tablet    Sig: Take 1 tablet (10 mg total) by mouth daily.    Dispense:  30 tablet    Refill:  5  . fluticasone (FLONASE) 50 MCG/ACT nasal spray    Sig: Place 2 sprays into both nostrils daily.    Dispense:  16 g   Refill:  5  . budesonide-formoterol (SYMBICORT) 160-4.5 MCG/ACT inhaler    Sig: Inhale 2 puffs into the lungs 2 (two) times daily.    Dispense:  1 Inhaler    Refill:  5  . tiotropium (SPIRIVA) 18 MCG inhalation capsule    Sig: Place 1 capsule (18 mcg total) into inhaler and inhale daily.    Dispense:  30 capsule    Refill:  3    Diagnostics: Spirometry reveals an FVC of 1.39 L and an FEV1 of 1.04 L.  Moderate restrictive pattern.  Please see scanned spirometry results for details.    Physical examination: Blood pressure (!) 152/80, pulse 81, temperature 98.2 F (36.8 C), temperature source Oral, resp. rate 20, height 5\' 5"  (1.651 m), weight 188 lb 12.8 oz (85.6 kg), SpO2 96 %.  General: Alert, interactive, in no acute distress. HEENT: TMs pearly gray, turbinates moderately edematous without discharge, post-pharynx mildly erythematous. Neck: Supple without lymphadenopathy. Lungs: Mildly decreased breath sounds bilaterally without wheezing, rhonchi or rales. CV: Normal S1, S2 without murmurs. Skin: Warm and dry, without lesions or rashes.  The following portions of the patient's history were reviewed and updated as appropriate: allergies, current medications, past family history, past medical history, past social history, past surgical history and problem list.  Allergies as of 04/20/2016      Reactions   Codeine Other (See Comments)   REACTION: GI upset   Lipitor [  atorvastatin]    Pravachol [pravastatin Sodium]       Medication List       Accurate as of 04/20/16  8:21 PM. Always use your most recent med list.          albuterol 108 (90 Base) MCG/ACT inhaler Commonly known as:  PROAIR HFA Inhale 2 puffs into the lungs every 4 (four) hours as needed for wheezing (or coughing.).   ALIGN 4 MG Caps Take 4 mg by mouth daily.   aspirin 325 MG tablet Take 325 mg by mouth daily.   AZOR 10-40 MG tablet Generic drug:  amLODipine-olmesartan Take 1 tablet by mouth daily.     BEANO PO Take 1 tablet by mouth 4 (four) times daily as needed (gas, bloating).   budesonide-formoterol 160-4.5 MCG/ACT inhaler Commonly known as:  SYMBICORT Inhale 2 puffs into the lungs 2 (two) times daily.   carvedilol 3.125 MG tablet Commonly known as:  COREG Take 3.125 mg by mouth 2 (two) times daily.   Cellulose Carmellose Sodium Powd Apply 1 application topically daily as needed (irritation). Apply to feet   clindamycin-benzoyl peroxide gel Commonly known as:  BENZACLIN Apply 1 application topically daily at 3 pm.   clobetasol cream 0.05 % Commonly known as:  TEMOVATE Apply 1 application topically daily as needed (hands, arms, neck, thighs).   clotrimazole 1 % cream Commonly known as:  LOTRIMIN Apply 1 application topically daily as needed (s). For skin condition   Diclofenac Sodium 1.5 % Soln Apply 12 drops topically 2 (two) times a week. Uses 12-14 drops on scalp as needed for dryness   fluocinolone 0.01 % cream Commonly known as:  VANOS Apply 1 application topically daily.   FLUoxetine 20 MG capsule Commonly known as:  PROZAC Take 20 mg by mouth daily.   fluticasone 50 MCG/ACT nasal spray Commonly known as:  FLONASE Place 2 sprays into both nostrils daily.   FLUTTER Devi Use as directed   furosemide 20 MG tablet Commonly known as:  LASIX Take 1 tablet (20 mg total) by mouth daily.   ketoconazole 2 % cream Commonly known as:  NIZORAL Apply 1 application topically daily.   metFORMIN 500 MG tablet Commonly known as:  GLUCOPHAGE Take 1,000 mg by mouth Twice daily.   montelukast 10 MG tablet Commonly known as:  SINGULAIR Take 1 tablet (10 mg total) by mouth daily.   MULTIVITAMIN & MINERAL PO Take 1 tablet by mouth daily. Reported on 06/17/2015   omeprazole 20 MG capsule Commonly known as:  PRILOSEC Take 20 mg by mouth daily.   simvastatin 20 MG tablet Commonly known as:  ZOCOR Take 20 mg by mouth every evening.   tamsulosin 0.4 MG Caps  capsule Commonly known as:  FLOMAX Take 0.4 mg by mouth daily.   tiotropium 18 MCG inhalation capsule Commonly known as:  SPIRIVA Place 1 capsule (18 mcg total) into inhaler and inhale daily.   triamcinolone cream 0.1 % Commonly known as:  KENALOG Apply topically 2 (two) times daily. Reported on 06/17/2015   VICTOZA 18 MG/3ML Sopn Generic drug:  liraglutide Inject 0.6 mg into the skin daily.   vitamin C 1000 MG tablet Take 1,000 mg by mouth daily. Reported on 06/17/2015   VITAMIN D-1000 MAX ST 1000 units tablet Generic drug:  Cholecalciferol Take 1,000 Units by mouth daily. Reported on 06/17/2015   ZOLOFT PO Take by mouth.       Allergies  Allergen Reactions  . Codeine Other (See Comments)  REACTION: GI upset  . Lipitor [Atorvastatin]   . Pravachol [Pravastatin Sodium]     I appreciate the opportunity to take part in Douglas Hansen's care. Please do not hesitate to contact me with questions.  Sincerely,   R. Edgar Frisk, MD

## 2016-04-20 NOTE — Assessment & Plan Note (Signed)
   Continue Symbicort 160/4.5 g, 2 inhalations via spacer device twice a day, Spiriva 18 g daily, montelukast 10 mg daily at bedtime, and albuterol HFA, 1-2 inhalations every 4-6 hours as needed.  I have encouraged him to avoid paint fumes to the degree that he is able.  He will call me if his symptoms persist or progress.

## 2016-04-20 NOTE — Patient Instructions (Signed)
Asthma with COPD ([place])  Continue Symbicort 160/4.5 g, 2 inhalations via spacer device twice a day, Spiriva 18 g daily, montelukast 10 mg daily at bedtime, and albuterol HFA, 1-2 inhalations every 4-6 hours as needed.  I have encouraged him to avoid paint fumes to the degree that he is able.  He will call me if his symptoms persist or progress.  Other allergic rhinitis  Continue appropriate allergen avoidance measures and montelukast 10 mg daily at bedtime.  Given his current symptoms, I have encouraged more frequent use of fluticasone nasal spray as well as nasal saline lavage (NeilMed) prior to medicated nasal sprays and as needed.   Return in about 4 months (around 08/18/2016), or if symptoms worsen or fail to improve.

## 2016-04-20 NOTE — Assessment & Plan Note (Signed)
   Continue appropriate allergen avoidance measures and montelukast 10 mg daily at bedtime.  Given his current symptoms, I have encouraged more frequent use of fluticasone nasal spray as well as nasal saline lavage (NeilMed) prior to medicated nasal sprays and as needed.

## 2016-05-11 ENCOUNTER — Ambulatory Visit: Payer: Medicare Other | Admitting: Internal Medicine

## 2016-05-23 ENCOUNTER — Ambulatory Visit (INDEPENDENT_AMBULATORY_CARE_PROVIDER_SITE_OTHER): Payer: Medicare Other

## 2016-05-23 ENCOUNTER — Encounter (HOSPITAL_COMMUNITY): Payer: Self-pay | Admitting: *Deleted

## 2016-05-23 ENCOUNTER — Ambulatory Visit (HOSPITAL_COMMUNITY)
Admission: EM | Admit: 2016-05-23 | Discharge: 2016-05-23 | Disposition: A | Discharge status: Home / Self Care | Payer: Medicare Other | Attending: Family Medicine | Admitting: Family Medicine

## 2016-05-23 DIAGNOSIS — R05 Cough: Secondary | ICD-10-CM | POA: Diagnosis not present

## 2016-05-23 DIAGNOSIS — J40 Bronchitis, not specified as acute or chronic: Secondary | ICD-10-CM

## 2016-05-23 DIAGNOSIS — R938 Abnormal findings on diagnostic imaging of other specified body structures: Secondary | ICD-10-CM | POA: Diagnosis not present

## 2016-05-23 DIAGNOSIS — R9389 Abnormal findings on diagnostic imaging of other specified body structures: Secondary | ICD-10-CM

## 2016-05-23 MED ORDER — LEVOFLOXACIN 500 MG PO TABS: 500.0000 mg | ORAL_TABLET | Freq: Every day | ORAL | 0 refills | Status: DC

## 2016-05-23 NOTE — Discharge Instructions (Signed)
Once again have an opacity on the chest x-ray in the right upper lobe. This has gotten a little more calcified and therefore needs to be reexamined probably with a CAT scan. I would like you to see your doctor in the next few days to reevaluate your progress on the antibiotics and to consider doing further imaging of this abnormality in the right lung.

## 2016-05-23 NOTE — ED Triage Notes (Signed)
Patient reports cough, right sided abdominal pain, sob, headache, and chills.

## 2016-05-23 NOTE — ED Provider Notes (Signed)
[place]    CSN: [id number] Arrival date & time: 05/23/16  1452     History   Chief Complaint Chief Complaint  Patient presents with  . Cough  . Abdominal Pain  . Headache    HPI Douglas Hansen is a 71 y.o. male.   Patient reports cough, right sided abdominal pain, headache, and chills. His symptoms of developing over the last 2 days, and patient's concern that he may have a recurrent pneumonia. The last episode he had of pneumonia was one year ago.  Patient denies shortness of breath. He does have sinus congestion.      Past Medical History:  Diagnosis Date  . Agent orange exposure   . Arthritis   . Asthma   . COPD (chronic obstructive pulmonary disease) ([place])   . Diabetes mellitus   . Hyperlipemia   . Hypertension   . OSA on CPAP   . Pneumonia   . PTSD (post-traumatic stress disorder)     Patient Active Problem List   Diagnosis Date Noted  . Acute sinusitis 12/16/2015  . Asthma with acute exacerbation 06/17/2015  . Acute bronchitis 06/17/2015  . Gastroesophageal reflux disease without esophagitis 06/17/2015  . SOB (shortness of breath) 04/17/2015  . Acute pulmonary edema ([place]) 04/17/2015  . Chronic obstructive pulmonary disease, unspecified copd, unspecified chronic bronchitis type 01/07/2015  . Moderate persistent asthma 01/07/2015  . Other allergic rhinitis 01/07/2015  . Obstructive sleep apnea treated with continuous positive airway pressure (CPAP) 01/07/2015  . Asthma with COPD ([place])     Class: Chronic  . Pneumonia due to other gram-negative bacteria ([place]) 01/30/2011  . Cough 06/09/2010  . COPD (chronic obstructive pulmonary disease) ([place]) 06/09/2010  . OSA on CPAP   . Diabetes mellitus   . Hyperlipemia   . Hypertension     Past Surgical History:  Procedure Laterality Date  . CARDIAC CATHETERIZATION    . right thumb surgery    . TONSILLECTOMY         Home Medications    Prior to Admission medications   Medication Sig  Start Date End Date Taking? Authorizing Provider  albuterol (PROAIR HFA) 108 (90 Base) MCG/ACT inhaler Inhale 2 puffs into the lungs every 4 (four) hours as needed for wheezing (or coughing.). 06/17/15   Charlies Silvers, MD  Alpha-D-Galactosidase (BEANO PO) Take 1 tablet by mouth 4 (four) times daily as needed (gas, bloating).     Historical Provider, MD  amLODipine-olmesartan (AZOR) 10-40 MG per tablet Take 1 tablet by mouth daily.      Historical Provider, MD  Ascorbic Acid (VITAMIN C) 1000 MG tablet Take 1,000 mg by mouth daily. Reported on 06/17/2015    Historical Provider, MD  aspirin 325 MG tablet Take 325 mg by mouth daily.      Historical Provider, MD  budesonide-formoterol (SYMBICORT) 160-4.5 MCG/ACT inhaler Inhale 2 puffs into the lungs 2 (two) times daily. 04/20/16   Adelina Mings, MD  carvedilol (COREG) 3.125 MG tablet Take 3.125 mg by mouth 2 (two) times daily.  11/29/14   Historical Provider, MD  Cellulose Carmellose Sodium POWD Apply 1 application topically daily as needed (irritation). Apply to feet    Historical Provider, MD  Cholecalciferol (VITAMIN D-1000 MAX ST) 1000 UNITS tablet Take 1,000 Units by mouth daily. Reported on 06/17/2015    Historical Provider, MD  clindamycin-benzoyl peroxide (BENZACLIN) gel Apply 1 application topically daily at 3 pm.     Historical Provider, MD  clobetasol (TEMOVATE) 0.05 %  cream Apply 1 application topically daily as needed (hands, arms, neck, thighs).     Historical Provider, MD  clotrimazole (LOTRIMIN) 1 % cream Apply 1 application topically daily as needed (s). For skin condition    Historical Provider, MD  Diclofenac Sodium 1.5 % SOLN Apply 12 drops topically 2 (two) times a week. Uses 12-14 drops on scalp as needed for dryness 10/17/14   Historical Provider, MD  fluocinolone (VANOS) 0.01 % cream Apply 1 application topically daily.     Historical Provider, MD  FLUoxetine (PROZAC) 20 MG capsule Take 20 mg by mouth daily.    Historical  Provider, MD  fluticasone (FLONASE) 50 MCG/ACT nasal spray Place 2 sprays into both nostrils daily. 04/20/16   Adelina Mings, MD  furosemide (LASIX) 20 MG tablet Take 1 tablet (20 mg total) by mouth daily. 05/09/15   Pixie Casino, MD  ketoconazole (NIZORAL) 2 % cream Apply 1 application topically daily.     Historical Provider, MD  levofloxacin (LEVAQUIN) 500 MG tablet Take 1 tablet (500 mg total) by mouth daily. 05/23/16   Robyn Haber, MD  metFORMIN (GLUCOPHAGE) 500 MG tablet Take 1,000 mg by mouth Twice daily.  07/28/10   Historical Provider, MD  montelukast (SINGULAIR) 10 MG tablet Take 1 tablet (10 mg total) by mouth daily. 04/20/16   Adelina Mings, MD  Multiple Vitamins-Minerals (MULTIVITAMIN & MINERAL PO) Take 1 tablet by mouth daily. Reported on 06/17/2015    Historical Provider, MD  omeprazole (PRILOSEC) 20 MG capsule Take 20 mg by mouth daily.    Historical Provider, MD  Probiotic Product (ALIGN) 4 MG CAPS Take 4 mg by mouth daily.     Historical Provider, MD  Respiratory Therapy Supplies (FLUTTER) DEVI Use as directed 04/20/16   Adelina Mings, MD  Sertraline HCl (ZOLOFT PO) Take by mouth.    Historical Provider, MD  simvastatin (ZOCOR) 20 MG tablet Take 20 mg by mouth every evening.    Historical Provider, MD  tamsulosin (FLOMAX) 0.4 MG CAPS capsule Take 0.4 mg by mouth daily. 05/01/15   Historical Provider, MD  tiotropium (SPIRIVA) 18 MCG inhalation capsule Place 1 capsule (18 mcg total) into inhaler and inhale daily. 04/20/16   Adelina Mings, MD  triamcinolone (KENALOG) 0.1 % cream Apply topically 2 (two) times daily. Reported on 06/17/2015    Historical Provider, MD  VICTOZA 18 MG/3ML SOPN Inject 0.6 mg into the skin daily. 12/24/14   Historical Provider, MD    Family History Family History  Problem Relation Age of Onset  . Emphysema Father   . Asthma Father   . Heart disease Father   . Heart attack Father   . Stroke Mother   . Heart attack Brother   .  Diabetes Brother   . Heart attack Brother   . Diabetes Brother   . Colon cancer Neg Hx     Social History Social History  Substance Use Topics  . Smoking status: Former Smoker    Packs/day: 1.00    Years: 20.00    Types: Cigarettes    Quit date: 03/09/1985  . Smokeless tobacco: Never Used  . Alcohol use No     Allergies   Codeine; Lipitor [atorvastatin]; and Pravachol [pravastatin sodium]   Review of Systems Review of Systems  Constitutional: Positive for fever.  HENT: Positive for congestion.   Respiratory: Positive for cough. Negative for shortness of breath.   Cardiovascular: Negative for chest pain.  Gastrointestinal: Positive for abdominal pain.  Genitourinary: Negative.   Musculoskeletal: Positive for myalgias.  Neurological: Positive for headaches.     Physical Exam Triage Vital Signs ED Triage Vitals  Enc Vitals Group     BP 05/23/16 1602 122/60     Pulse Rate 05/23/16 1602 84     Resp 05/23/16 1602 14     Temp 05/23/16 1602 100.3 F (37.9 C)     Temp Source 05/23/16 1602 Oral     SpO2 05/23/16 1602 95 %     Weight --      Height --      Head Circumference --      Peak Flow --      Pain Score 05/23/16 1611 7     Pain Loc --      Pain Edu? --      Excl. in [place]? --    No data found.   Updated Vital Signs BP 122/60 (BP Location: Left Arm)   Pulse 84   Temp 100.3 F (37.9 C) (Oral)   Resp 14   SpO2 95%    Physical Exam  Constitutional: He is oriented to person, place, and time. He appears well-developed and well-nourished.  HENT:  Head: Normocephalic.  Right Ear: External ear normal.  Left Ear: External ear normal.  Eyes: Conjunctivae and EOM are normal. Pupils are equal, round, and reactive to light.  Neck: Normal range of motion. Neck supple.  Pulmonary/Chest: Effort normal. He has wheezes.  Musculoskeletal: Normal range of motion.  Neurological: He is alert and oriented to person, place, and time.  Skin: Skin is dry.  Nursing note  and vitals reviewed.    UC Treatments / Results  Labs (all labs ordered are listed, but only abnormal results are displayed) Labs Reviewed - No data to display  EKG  EKG Interpretation None       Radiology No results found.  Procedures Procedures (including critical care time)  Medications Ordered in UC Medications - No data to display   Initial Impression / Assessment and Plan / UC Course  I have reviewed the triage vital signs and the nursing notes.  Pertinent labs & imaging results that were available during my care of the patient were reviewed by me and considered in my medical decision making (see chart for details).     Final Clinical Impressions(s) / UC Diagnoses   Final diagnoses:  Bronchitis  Abnormal CXR    New Prescriptions New Prescriptions   LEVOFLOXACIN (LEVAQUIN) 500 MG TABLET    Take 1 tablet (500 mg total) by mouth daily.  Follow-up in 3 days with primary care doctor   Robyn Haber, MD 05/23/16 1704

## 2016-05-29 DIAGNOSIS — R938 Abnormal findings on diagnostic imaging of other specified body structures: Secondary | ICD-10-CM | POA: Diagnosis not present

## 2016-05-29 DIAGNOSIS — Z77098 Contact with and (suspected) exposure to other hazardous, chiefly nonmedicinal, chemicals: Secondary | ICD-10-CM | POA: Diagnosis not present

## 2016-05-29 DIAGNOSIS — Z87891 Personal history of nicotine dependence: Secondary | ICD-10-CM | POA: Diagnosis not present

## 2016-05-29 DIAGNOSIS — J439 Emphysema, unspecified: Secondary | ICD-10-CM | POA: Diagnosis not present

## 2016-05-29 DIAGNOSIS — R918 Other nonspecific abnormal finding of lung field: Secondary | ICD-10-CM | POA: Diagnosis not present

## 2016-06-02 ENCOUNTER — Ambulatory Visit (INDEPENDENT_AMBULATORY_CARE_PROVIDER_SITE_OTHER): Payer: Medicare Other | Admitting: Internal Medicine

## 2016-06-02 ENCOUNTER — Encounter: Payer: Self-pay | Admitting: Internal Medicine

## 2016-06-02 VITALS — BP 162/84 | HR 85 | Wt 185.0 lb

## 2016-06-02 DIAGNOSIS — I10 Essential (primary) hypertension: Secondary | ICD-10-CM | POA: Diagnosis not present

## 2016-06-02 DIAGNOSIS — R5383 Other fatigue: Secondary | ICD-10-CM

## 2016-06-02 DIAGNOSIS — E785 Hyperlipidemia, unspecified: Secondary | ICD-10-CM | POA: Diagnosis not present

## 2016-06-02 DIAGNOSIS — I739 Peripheral vascular disease, unspecified: Secondary | ICD-10-CM | POA: Diagnosis not present

## 2016-06-02 DIAGNOSIS — R0602 Shortness of breath: Secondary | ICD-10-CM | POA: Diagnosis not present

## 2016-06-02 NOTE — Patient Instructions (Addendum)
Your physician has requested that you have a lower extremity arterial duplex. This test is an ultrasound of the arteries in the legs. It looks at arterial blood flow in the legs. Allow one hour for Lower Arterial scans. There are no restrictions or special instructions  Your physician has requested that you have a lexiscan myoview. For further information please visit [email or web address]. Please follow instruction sheet, as given.  Your physician recommends that you schedule a follow-up appointment with Dr. Debara Pickett after your testing.

## 2016-06-03 DIAGNOSIS — I739 Peripheral vascular disease, unspecified: Secondary | ICD-10-CM | POA: Insufficient documentation

## 2016-06-03 DIAGNOSIS — E785 Hyperlipidemia, unspecified: Secondary | ICD-10-CM | POA: Insufficient documentation

## 2016-06-03 DIAGNOSIS — R5383 Other fatigue: Secondary | ICD-10-CM | POA: Insufficient documentation

## 2016-06-03 NOTE — Progress Notes (Signed)
OFFICE NOTE  Chief Complaint:  Routine follow-up  Primary Care Physician: [name], MD  HPI:  Douglas Hansen is a pleasant 71 year old male who is currently referred to me by Dr. Maudie Mercury. His past Douglas Hansen history is extensive and includes the following: Hypertension, dyslipidemia, type 2 diabetes, COPD, obstructive sleep apnea on CPAP, GERD, mild PAD, and significant allergic symptoms. He was previously followed by Dr. Einar Gip and has had 2 cardiac catheterizations in his past. His last heart catheterization was in 2004 which showed no significant coronary disease. He had a stress test in February 2014 which was negative for ischemia and EF of 68%. He also had ABIs for leg pain which were 0.9 on the right and 0.94 on the left. He was started on Pletal but had some shortness of breath and abnormal symptoms on it and this was discontinued. His last echocardiogram was in March 2014 showed an EF of 52% with mild to moderate right atrial enlargement, dilated IVC and elevated right atrial pressure. Recently had an upper respiratory infection concerning for COPD exacerbation. He was started on Avelox which she completed. At the end of treatment he felt some improvement in his breathing but then started to have shortness of breath again and cough. He was switched to azithromycin and placed on a prednisone Dosepak. He was also started on low-dose Lasix for chest x-ray findings of some fluid in the right fissure. Douglas Hansen says that he gained a few pounds in weight with this shortness of breath and noted that he's been on Lasix now for 10 days and that he's lost some weight and feels less fullness in his belly and in his chest. He denied any ankle edema. His shortness of breath is improving. He also denies any chest pain or anginal symptoms. He had a mild amount of orthopnea which is also resolving.   Douglas Hansen returns today for follow-up of his echocardiogram. This showed normal LV function with mild pulmonary  hypertension and elevated RVSP of 35 mmHg. This is likely secondary to COPD, but does not explain his shortness of breath. He is currently on Lasix 20 mg daily has not noted a significant improvement in his shortness of breath.  06/02/2016  Douglas Hansen was seen today in follow-up. Overall he seems to be doing well. Blood pressure initially was elevated 162/84. Weight is down from 196-185 pounds. He denies any chest pain. He is short of breath with exertion however it is difficult to tell whether this is related to COPD or not. He has reported some pain in both legs when walking. He says it seems to come on after walking certain distances and gets better at rest, reminiscent of possible claudication.  PMHx:  Past Medical History:  Diagnosis Date  . Agent orange exposure   . Arthritis   . Asthma   . COPD (chronic obstructive pulmonary disease) ([place])   . Diabetes mellitus   . Hyperlipemia   . Hypertension   . OSA on CPAP   . Pneumonia   . PTSD (post-traumatic stress disorder)     Past Surgical History:  Procedure Laterality Date  . CARDIAC CATHETERIZATION    . right thumb surgery    . TONSILLECTOMY      FAMHx:  Family History  Problem Relation Age of Onset  . Emphysema Father   . Asthma Father   . Heart disease Father   . Heart attack Father   . Stroke Mother   . Heart attack Brother   .  Diabetes Brother   . Heart attack Brother   . Diabetes Brother   . Colon cancer Neg Hx     SOCHx:   reports that he quit smoking about 31 years ago. His smoking use included Cigarettes. He has a 20.00 pack-year smoking history. He has never used smokeless tobacco. He reports that he does not drink alcohol or use drugs.  ALLERGIES:  Allergies  Allergen Reactions  . Codeine Other (See Comments)    REACTION: GI upset  . Lipitor [Atorvastatin]   . Pravachol [Pravastatin Sodium]     ROS: Pertinent items noted in HPI and remainder of comprehensive ROS otherwise negative.  HOME  MEDS: Current Outpatient Prescriptions  Medication Sig Dispense Refill  . albuterol (PROAIR HFA) 108 (90 Base) MCG/ACT inhaler Inhale 2 puffs into the lungs every 4 (four) hours as needed for wheezing (or coughing.). 1 Inhaler 1  . Alpha-D-Galactosidase (BEANO PO) Take 1 tablet by mouth 4 (four) times daily as needed (gas, bloating).     Marland Kitchen amLODipine-olmesartan (AZOR) 10-40 MG per tablet Take 1 tablet by mouth daily.      . Ascorbic Acid (VITAMIN C) 1000 MG tablet Take 1,000 mg by mouth daily. Reported on 06/17/2015    . aspirin 325 MG tablet Take 325 mg by mouth daily.      . budesonide-formoterol (SYMBICORT) 160-4.5 MCG/ACT inhaler Inhale 2 puffs into the lungs 2 (two) times daily. 1 Inhaler 5  . carvedilol (COREG) 3.125 MG tablet Take 3.125 mg by mouth 2 (two) times daily.   1  . Cellulose Carmellose Sodium POWD Apply 1 application topically daily as needed (irritation). Apply to feet    . Cholecalciferol (VITAMIN D-1000 MAX ST) 1000 UNITS tablet Take 1,000 Units by mouth daily. Reported on 06/17/2015    . clindamycin-benzoyl peroxide (BENZACLIN) gel Apply 1 application topically daily at 3 pm.     . clobetasol (TEMOVATE) 0.05 % cream Apply 1 application topically daily as needed (hands, arms, neck, thighs).     . clotrimazole (LOTRIMIN) 1 % cream Apply 1 application topically daily as needed (s). For skin condition    . Diclofenac Sodium 1.5 % SOLN Apply 12 drops topically 2 (two) times a week. Uses 12-14 drops on scalp as needed for dryness  0  . fluocinolone (VANOS) 0.01 % cream Apply 1 application topically daily.     . fluticasone (FLONASE) 50 MCG/ACT nasal spray Place 2 sprays into both nostrils daily. 16 g 5  . furosemide (LASIX) 20 MG tablet Take 1 tablet (20 mg total) by mouth daily. 90 tablet 3  . ketoconazole (NIZORAL) 2 % cream Apply 1 application topically daily.     Marland Kitchen levofloxacin (LEVAQUIN) 500 MG tablet Take 1 tablet (500 mg total) by mouth daily. 7 tablet 0  . metFORMIN  (GLUCOPHAGE) 500 MG tablet Take 1,000 mg by mouth Twice daily.     . montelukast (SINGULAIR) 10 MG tablet Take 1 tablet (10 mg total) by mouth daily. 30 tablet 5  . Multiple Vitamins-Minerals (MULTIVITAMIN & MINERAL PO) Take 1 tablet by mouth daily. Reported on 06/17/2015    . omeprazole (PRILOSEC) 20 MG capsule Take 20 mg by mouth daily.    . Probiotic Product (ALIGN) 4 MG CAPS Take 4 mg by mouth daily.     Marland Kitchen Respiratory Therapy Supplies (FLUTTER) DEVI Use as directed 1 each 0  . Sertraline HCl (ZOLOFT PO) Take by mouth.    . simvastatin (ZOCOR) 20 MG tablet Take 20 mg by  mouth every evening.    . tamsulosin (FLOMAX) 0.4 MG CAPS capsule Take 0.4 mg by mouth daily.  0  . tiotropium (SPIRIVA) 18 MCG inhalation capsule Place 1 capsule (18 mcg total) into inhaler and inhale daily. 30 capsule 3  . triamcinolone (KENALOG) 0.1 % cream Apply topically 2 (two) times daily. Reported on 06/17/2015    . VICTOZA 18 MG/3ML SOPN Inject 0.6 mg into the skin daily.  0   No current facility-administered medications for this visit.     LABS/IMAGING: No results found for this or any previous visit (from the past 48 hour(s)). No results found.  WEIGHTS: Wt Readings from Last 3 Encounters:  06/02/16 185 lb (83.9 kg)  04/20/16 188 lb 12.8 oz (85.6 kg)  05/09/15 196 lb 14.4 oz (89.3 kg)    VITALS: BP (!) 162/84   Pulse 85   Wt 185 lb (83.9 kg)   BMI 30.79 kg/m   EXAM: General appearance: alert and no distress Neck: no carotid bruit and no JVD Lungs: diminished breath sounds bilaterally Heart: regular rate and rhythm, S1, S2 normal, no murmur, click, rub or gallop Abdomen: soft, non-tender; bowel sounds normal; no masses,  no organomegaly Extremities: extremities normal, atraumatic, no cyanosis or edema Pulses: 2+ and symmetric Skin: Skin color, texture, turgor normal. No rashes or lesions Neurologic: Grossly normal Psych: Pleasant  EKG: Normal sinus rhythm at 85  ASSESSMENT: 1. Dyspnea on  exertion - normal LVEF 65-70%, DD, mild pulm HTN 2. Leg pain concerning for claudication- history of mildly reduced ABIs 3. COPD with acute exacerbation 4. Hypertension 5. Dyslipidemia 6. OSA on CPAP 7. Type 2 diabetes  PLAN: 1.   Mr. Ganas is now describing more shortness of breath with exertion. He does not feel like this is consistent with his COPD. I think he would benefit from nuclear stress testing to evaluate for a possible coronary ischemia. He has had recurrent COPD exacerbations leading to multiple hospitalizations recently. In addition he has a history of mildly reduced ABIs but continues to have leg pain. It seems to be more consistent with walking certain distances and concerning for possible lifestyle limiting claudication. I recommend repeat lower extremity arterial Dopplers. Plan to see him back for follow-up of the studies in a few weeks.  Pixie Casino, MD, [facility] Attending Cardiologist [place] 06/03/2016, 5:29 PM

## 2016-06-12 ENCOUNTER — Telehealth (HOSPITAL_COMMUNITY): Payer: Self-pay

## 2016-06-12 NOTE — Telephone Encounter (Signed)
Encounter complete. 

## 2016-06-17 ENCOUNTER — Ambulatory Visit (HOSPITAL_COMMUNITY)
Admission: RE | Admit: 2016-06-17 | Discharge: 2016-06-17 | Disposition: A | Discharge status: Home / Self Care | Payer: Medicare Other | Source: Ambulatory Visit | Attending: Cardiology | Admitting: Cardiology

## 2016-06-17 DIAGNOSIS — R5383 Other fatigue: Secondary | ICD-10-CM | POA: Diagnosis not present

## 2016-06-17 DIAGNOSIS — I10 Essential (primary) hypertension: Secondary | ICD-10-CM | POA: Diagnosis not present

## 2016-06-17 DIAGNOSIS — E1151 Type 2 diabetes mellitus with diabetic peripheral angiopathy without gangrene: Secondary | ICD-10-CM | POA: Diagnosis not present

## 2016-06-17 DIAGNOSIS — R0602 Shortness of breath: Secondary | ICD-10-CM | POA: Insufficient documentation

## 2016-06-17 DIAGNOSIS — E785 Hyperlipidemia, unspecified: Secondary | ICD-10-CM

## 2016-06-17 DIAGNOSIS — Z8249 Family history of ischemic heart disease and other diseases of the circulatory system: Secondary | ICD-10-CM | POA: Insufficient documentation

## 2016-06-17 DIAGNOSIS — G4733 Obstructive sleep apnea (adult) (pediatric): Secondary | ICD-10-CM | POA: Insufficient documentation

## 2016-06-17 DIAGNOSIS — Z87891 Personal history of nicotine dependence: Secondary | ICD-10-CM | POA: Insufficient documentation

## 2016-06-17 DIAGNOSIS — J449 Chronic obstructive pulmonary disease, unspecified: Secondary | ICD-10-CM | POA: Diagnosis not present

## 2016-06-17 LAB — MYOCARDIAL PERFUSION IMAGING
LV dias vol: 82 mL (ref 62–150)
LV sys vol: 25 mL
Peak HR: 86 {beats}/min
Rest HR: 69 {beats}/min
SDS: 3
SRS: 1
SSS: 4
TID: 1.02

## 2016-06-17 MED ORDER — REGADENOSON 0.4 MG/5ML IV SOLN
0.4000 mg | Freq: Once | INTRAVENOUS | Status: AC
  Administered 2016-06-17: 0.4 mg via INTRAVENOUS

## 2016-06-17 MED ORDER — AMINOPHYLLINE 25 MG/ML IV SOLN
75.0000 mg | Freq: Once | INTRAVENOUS | Status: AC
  Administered 2016-06-17: 75 mg via INTRAVENOUS

## 2016-06-17 MED ORDER — TECHNETIUM TC 99M TETROFOSMIN IV KIT
29.9000 | PACK | Freq: Once | INTRAVENOUS | Status: AC | PRN
Start: 2016-06-17 — End: 2016-06-17
  Administered 2016-06-17: 29.9 via INTRAVENOUS
  Filled 2016-06-17: qty 30

## 2016-06-17 MED ORDER — TECHNETIUM TC 99M TETROFOSMIN IV KIT
10.1000 | PACK | Freq: Once | INTRAVENOUS | Status: AC | PRN
  Administered 2016-06-17: 10.1 via INTRAVENOUS
  Filled 2016-06-17: qty 11

## 2016-06-22 ENCOUNTER — Encounter (HOSPITAL_COMMUNITY): Payer: Medicare Other

## 2016-07-02 ENCOUNTER — Other Ambulatory Visit: Payer: Self-pay | Admitting: Internal Medicine

## 2016-07-02 DIAGNOSIS — I739 Peripheral vascular disease, unspecified: Secondary | ICD-10-CM

## 2016-07-07 ENCOUNTER — Ambulatory Visit: Payer: Medicare Other | Admitting: Internal Medicine

## 2016-07-10 ENCOUNTER — Ambulatory Visit (HOSPITAL_COMMUNITY)
Admission: RE | Admit: 2016-07-10 | Discharge: 2016-07-10 | Disposition: A | Discharge status: Home / Self Care | Payer: Medicare Other | Source: Ambulatory Visit | Attending: Cardiovascular Disease | Admitting: Cardiovascular Disease

## 2016-07-10 DIAGNOSIS — R202 Paresthesia of skin: Secondary | ICD-10-CM | POA: Insufficient documentation

## 2016-07-10 DIAGNOSIS — R200 Anesthesia of skin: Secondary | ICD-10-CM | POA: Insufficient documentation

## 2016-07-10 DIAGNOSIS — J449 Chronic obstructive pulmonary disease, unspecified: Secondary | ICD-10-CM | POA: Diagnosis not present

## 2016-07-10 DIAGNOSIS — E1151 Type 2 diabetes mellitus with diabetic peripheral angiopathy without gangrene: Secondary | ICD-10-CM | POA: Insufficient documentation

## 2016-07-10 DIAGNOSIS — I10 Essential (primary) hypertension: Secondary | ICD-10-CM | POA: Diagnosis not present

## 2016-07-10 DIAGNOSIS — I739 Peripheral vascular disease, unspecified: Secondary | ICD-10-CM

## 2016-07-10 DIAGNOSIS — E785 Hyperlipidemia, unspecified: Secondary | ICD-10-CM | POA: Diagnosis not present

## 2016-07-10 DIAGNOSIS — Z87898 Personal history of other specified conditions: Secondary | ICD-10-CM | POA: Insufficient documentation

## 2016-08-18 ENCOUNTER — Ambulatory Visit: Payer: Medicare Other | Admitting: Allergy and Immunology

## 2016-08-18 DIAGNOSIS — J309 Allergic rhinitis, unspecified: Secondary | ICD-10-CM

## 2016-09-08 ENCOUNTER — Ambulatory Visit: Payer: Medicare Other | Admitting: Internal Medicine

## 2016-09-14 ENCOUNTER — Encounter: Payer: Self-pay | Admitting: Internal Medicine

## 2016-09-14 ENCOUNTER — Ambulatory Visit (INDEPENDENT_AMBULATORY_CARE_PROVIDER_SITE_OTHER): Payer: Medicare Other | Admitting: Internal Medicine

## 2016-09-14 VITALS — BP 114/74 | HR 74 | Ht 67.0 in | Wt 182.0 lb

## 2016-09-14 DIAGNOSIS — I10 Essential (primary) hypertension: Secondary | ICD-10-CM

## 2016-09-14 DIAGNOSIS — R0602 Shortness of breath: Secondary | ICD-10-CM

## 2016-09-14 DIAGNOSIS — J449 Chronic obstructive pulmonary disease, unspecified: Secondary | ICD-10-CM

## 2016-09-14 NOTE — Patient Instructions (Signed)
Your physician wants you to follow-up in: ONE YEAR with Dr. Hilty. You will receive a reminder letter in the mail two months in advance. If you don't receive a letter, please call our office to schedule the follow-up appointment.  

## 2016-09-14 NOTE — Progress Notes (Signed)
OFFICE NOTE  Chief Complaint:  Follow-up testing  Primary Care Physician: [name], MD  HPI:  Douglas Hansen is a pleasant 71 year old male who is currently referred to me by Dr. Maudie Mercury. His past [name] history is extensive and includes the following: Hypertension, dyslipidemia, type 2 diabetes, COPD, obstructive sleep apnea on CPAP, GERD, mild PAD, and significant allergic symptoms. He was previously followed by Dr. Einar Gip and has had 2 cardiac catheterizations in his past. His last heart catheterization was in 2004 which showed no significant coronary disease. He had a stress test in February 2014 which was negative for ischemia and EF of 68%. He also had ABIs for leg pain which were 0.9 on the right and 0.94 on the left. He was started on Pletal but had some shortness of breath and abnormal symptoms on it and this was discontinued. His last echocardiogram was in March 2014 showed an EF of 52% with mild to moderate right atrial enlargement, dilated IVC and elevated right atrial pressure. Recently had an upper respiratory infection concerning for COPD exacerbation. He was started on Avelox which she completed. At the end of treatment he felt some improvement in his breathing but then started to have shortness of breath again and cough. He was switched to azithromycin and placed on a prednisone Dosepak. He was also started on low-dose Lasix for chest x-ray findings of some fluid in the right fissure. Douglas Hansen says that he gained a few pounds in weight with this shortness of breath and noted that he's been on Lasix now for 10 days and that he's lost some weight and feels less fullness in his belly and in his chest. He denied any ankle edema. His shortness of breath is improving. He also denies any chest pain or anginal symptoms. He had a mild amount of orthopnea which is also resolving.   Douglas Hansen returns today for follow-up of his echocardiogram. This showed normal LV function with mild pulmonary  hypertension and elevated RVSP of 35 mmHg. This is likely secondary to COPD, but does not explain his shortness of breath. He is currently on Lasix 20 mg daily has not noted a significant improvement in his shortness of breath.  06/02/2016  Douglas Hansen was seen today in follow-up. Overall he seems to be doing well. Blood pressure initially was elevated 162/84. Weight is down from 196-185 pounds. He denies any chest pain. He is short of breath with exertion however it is difficult to tell whether this is related to COPD or not. He has reported some pain in both legs when walking. He says it seems to come on after walking certain distances and gets better at rest, reminiscent of possible claudication.  09/14/2016  Douglas Hansen returns today for follow-up. He still has concerns about bilateral leg pain with walking. He underwent ABIs and lower extremity arterial Dopplers which were normal. He also underwent a nuclear stress test because worsening shortness of breath and was found to have no evidence of ischemia with normal LV function. He's required increasing use of his inhalers recently and I suspect his symptoms may be related to COPD.  PMHx:  Past Medical History:  Diagnosis Date  . Agent orange exposure   . Arthritis   . Asthma   . COPD (chronic obstructive pulmonary disease) ([place])   . Diabetes mellitus   . Hyperlipemia   . Hypertension   . OSA on CPAP   . Pneumonia   . PTSD (post-traumatic stress disorder)  Past Surgical History:  Procedure Laterality Date  . CARDIAC CATHETERIZATION    . right thumb surgery    . TONSILLECTOMY      FAMHx:  Family History  Problem Relation Age of Onset  . Emphysema Father   . Asthma Father   . Heart disease Father   . Heart attack Father   . Stroke Mother   . Heart attack Brother   . Diabetes Brother   . Heart attack Brother   . Diabetes Brother   . Colon cancer Neg Hx     SOCHx:   reports that he quit smoking about 31 years ago. His  smoking use included Cigarettes. He has a 20.00 pack-year smoking history. He has never used smokeless tobacco. He reports that he does not drink alcohol or use drugs.  ALLERGIES:  Allergies  Allergen Reactions  . Codeine Other (See Comments)    REACTION: GI upset  . Lipitor [Atorvastatin]   . Pravachol [Pravastatin Sodium]     ROS: Pertinent items noted in HPI and remainder of comprehensive ROS otherwise negative.  HOME MEDS: Current Outpatient Prescriptions  Medication Sig Dispense Refill  . albuterol (PROAIR HFA) 108 (90 Base) MCG/ACT inhaler Inhale 2 puffs into the lungs every 4 (four) hours as needed for wheezing (or coughing.). 1 Inhaler 1  . Alpha-D-Galactosidase (BEANO PO) Take 1 tablet by mouth 4 (four) times daily as needed (gas, bloating).     Marland Kitchen amLODipine-olmesartan (AZOR) 10-40 MG per tablet Take 1 tablet by mouth daily.      . Ascorbic Acid (VITAMIN C) 1000 MG tablet Take 1,000 mg by mouth daily. Reported on 06/17/2015    . aspirin 325 MG tablet Take 325 mg by mouth daily.      . budesonide-formoterol (SYMBICORT) 160-4.5 MCG/ACT inhaler Inhale 2 puffs into the lungs 2 (two) times daily. 1 Inhaler 5  . carvedilol (COREG) 3.125 MG tablet Take 3.125 mg by mouth 2 (two) times daily.   1  . Cellulose Carmellose Sodium POWD Apply 1 application topically daily as needed (irritation). Apply to feet    . Cholecalciferol (VITAMIN D-1000 MAX ST) 1000 UNITS tablet Take 1,000 Units by mouth daily. Reported on 06/17/2015    . clindamycin-benzoyl peroxide (BENZACLIN) gel Apply 1 application topically daily at 3 pm.     . clobetasol (TEMOVATE) 0.05 % cream Apply 1 application topically daily as needed (hands, arms, neck, thighs).     . clotrimazole (LOTRIMIN) 1 % cream Apply 1 application topically daily as needed (s). For skin condition    . Diclofenac Sodium 1.5 % SOLN Apply 12 drops topically 2 (two) times a week. Uses 12-14 drops on scalp as needed for dryness  0  . fluocinolone (VANOS)  0.01 % cream Apply 1 application topically daily.     . fluticasone (FLONASE) 50 MCG/ACT nasal spray Place 2 sprays into both nostrils daily. 16 g 5  . furosemide (LASIX) 20 MG tablet Take 1 tablet (20 mg total) by mouth daily. 90 tablet 3  . ketoconazole (NIZORAL) 2 % cream Apply 1 application topically daily.     Marland Kitchen levofloxacin (LEVAQUIN) 500 MG tablet Take 1 tablet (500 mg total) by mouth daily. 7 tablet 0  . metFORMIN (GLUCOPHAGE) 500 MG tablet Take 1,000 mg by mouth Twice daily.     . montelukast (SINGULAIR) 10 MG tablet Take 1 tablet (10 mg total) by mouth daily. 30 tablet 5  . Multiple Vitamins-Minerals (MULTIVITAMIN & MINERAL PO) Take 1 tablet by mouth  daily. Reported on 06/17/2015    . omeprazole (PRILOSEC) 20 MG capsule Take 20 mg by mouth daily.    . Probiotic Product (ALIGN) 4 MG CAPS Take 4 mg by mouth daily.     Marland Kitchen Respiratory Therapy Supplies (FLUTTER) DEVI Use as directed 1 each 0  . Sertraline HCl (ZOLOFT PO) Take by mouth.    . simvastatin (ZOCOR) 20 MG tablet Take 20 mg by mouth every evening.    . tamsulosin (FLOMAX) 0.4 MG CAPS capsule Take 0.4 mg by mouth daily.  0  . tiotropium (SPIRIVA) 18 MCG inhalation capsule Place 1 capsule (18 mcg total) into inhaler and inhale daily. 30 capsule 3  . triamcinolone (KENALOG) 0.1 % cream Apply topically 2 (two) times daily. Reported on 06/17/2015    . VICTOZA 18 MG/3ML SOPN Inject 0.6 mg into the skin daily.  0   No current facility-administered medications for this visit.     LABS/IMAGING: No results found for this or any previous visit (from the past 48 hour(s)). No results found.  WEIGHTS: Wt Readings from Last 3 Encounters:  09/14/16 182 lb (82.6 kg)  06/17/16 185 lb (83.9 kg)  06/02/16 185 lb (83.9 kg)    VITALS: BP 114/74   Pulse 74   Ht 5\' 7"  (1.702 m)   Wt 182 lb (82.6 kg)   BMI 28.51 kg/m   EXAM: Deferred  EKG: Normal sinus rhythm at 74, possible inferior infarct with Q waves in 3 and aVF - personally  reviewed  ASSESSMENT: 1. Dyspnea on exertion - normal LVEF 65-70%, DD, mild pulm HTN, low risk Myoview-negative for ischemia (06/2016) 2. Leg pain concerning for claudication- history of mildly reduced ABIs 3. COPD with acute exacerbation 4. Hypertension 5. Dyslipidemia 6. OSA on CPAP 7. Type 2 diabetes  PLAN: 1.   Mr. Holtmeyer says he still short of breath with exertion however I suspect this is primarily pulmonary. He underwent a nuclear stress test which was negative for ischemia. There is no evidence for inferior scar although he has Q waves noted on his EKG. I suspect this is due to hyperinflation of his lungs more consistent with COPD. I would refer him back to his pulmonary provider for evaluation of this. Fortunately his buttocks from the arterial Dopplers were normal. He may be having orthopedic or lumbar pain which is causing his leg pain on ambulation.  Follow-up with me annually or sooner as necessary.  Pixie Casino, MD, [facility] Attending Cardiologist [place] 09/14/2016, 1:00 PM

## 2016-10-01 DIAGNOSIS — I10 Essential (primary) hypertension: Secondary | ICD-10-CM | POA: Diagnosis not present

## 2016-10-01 DIAGNOSIS — E118 Type 2 diabetes mellitus with unspecified complications: Secondary | ICD-10-CM | POA: Diagnosis not present

## 2016-10-05 ENCOUNTER — Telehealth: Payer: Self-pay | Admitting: Allergy and Immunology

## 2016-10-05 NOTE — Telephone Encounter (Signed)
Pt received his 1st no show fee and is asking for forgiveness as this is his first time.

## 2016-10-06 DIAGNOSIS — E119 Type 2 diabetes mellitus without complications: Secondary | ICD-10-CM | POA: Diagnosis not present

## 2016-10-06 DIAGNOSIS — E785 Hyperlipidemia, unspecified: Secondary | ICD-10-CM | POA: Diagnosis not present

## 2016-10-06 DIAGNOSIS — I10 Essential (primary) hypertension: Secondary | ICD-10-CM | POA: Diagnosis not present

## 2016-10-06 DIAGNOSIS — R32 Unspecified urinary incontinence: Secondary | ICD-10-CM | POA: Diagnosis not present

## 2016-10-07 NOTE — Telephone Encounter (Signed)
I will remove his first no show fee. I did inform patient.

## 2016-11-30 ENCOUNTER — Encounter: Payer: Self-pay | Admitting: Allergy and Immunology

## 2016-11-30 ENCOUNTER — Ambulatory Visit (INDEPENDENT_AMBULATORY_CARE_PROVIDER_SITE_OTHER): Payer: Medicare Other | Admitting: Allergy and Immunology

## 2016-11-30 VITALS — BP 120/75 | HR 75 | Temp 97.7°F | Resp 16

## 2016-11-30 DIAGNOSIS — J3089 Other allergic rhinitis: Secondary | ICD-10-CM | POA: Diagnosis not present

## 2016-11-30 DIAGNOSIS — J45901 Unspecified asthma with (acute) exacerbation: Secondary | ICD-10-CM | POA: Diagnosis not present

## 2016-11-30 DIAGNOSIS — J449 Chronic obstructive pulmonary disease, unspecified: Secondary | ICD-10-CM | POA: Diagnosis not present

## 2016-11-30 DIAGNOSIS — J0100 Acute maxillary sinusitis, unspecified: Secondary | ICD-10-CM | POA: Diagnosis not present

## 2016-11-30 MED ORDER — PREDNISONE 1 MG PO TABS: 10.0000 mg | ORAL_TABLET | Freq: Every day | ORAL | Status: AC

## 2016-11-30 MED ORDER — MONTELUKAST SODIUM 10 MG PO TABS: 10.0000 mg | ORAL_TABLET | Freq: Every day | ORAL | 5 refills | Status: DC

## 2016-11-30 MED ORDER — FLUTICASONE PROPIONATE 50 MCG/ACT NA SUSP: 2.0000 | Freq: Every day | NASAL | 5 refills | Status: DC

## 2016-11-30 MED ORDER — BUDESONIDE-FORMOTEROL FUMARATE 160-4.5 MCG/ACT IN AERO: 2.0000 | INHALATION_SPRAY | Freq: Two times a day (BID) | RESPIRATORY_TRACT | 5 refills | Status: DC

## 2016-11-30 NOTE — Assessment & Plan Note (Addendum)
   Prednisone has been provided (as above).  For the next week or so, increase fluticasone nasal sprays to 2 sprays per nostril twice daily.  After, he may resume as needed use of this medication.  Nasal saline lavage (NeilMed) has been recommended as needed and prior to medicated nasal sprays along with instructions for proper administration.  For thick post nasal drainage, add guaifenesin [phone] mg (Mucinex)  twice daily as needed with adequate hydration as discussed.  The patient has been asked to contact me if his symptoms persist, progress, or if he becomes febrile.

## 2016-11-30 NOTE — Patient Instructions (Addendum)
Asthma with acute exacerbation  Refill/restart Symbicort 160-4.5 g, 2 inhalations via spacer device twice a day.  To hasten symptom relief, prednisone has been provided, 20 mg x 4 days, 10 mg x1 day, then stop.  Continue Spiriva 18 mcg daily, montelukast 10 mg daily at bedtime and albuterol HFA, 1-2 inhalations every 4-6 hours as needed.  The patient has been asked to contact me if his symptoms persist or progress. Otherwise, he may return for follow up in 4 months.  Acute sinusitis  Prednisone has been provided (as above).  For the next week or so, increase fluticasone nasal sprays to 2 sprays per nostril twice daily.  After, he may resume as needed use of this medication.  Nasal saline lavage (NeilMed) has been recommended as needed and prior to medicated nasal sprays along with instructions for proper administration.  For thick post nasal drainage, add guaifenesin [phone] mg (Mucinex)  twice daily as needed with adequate hydration as discussed.  The patient has been asked to contact me if his symptoms persist, progress, or if he becomes febrile.  Other allergic rhinitis  Continue appropriate allergen avoidance measures, fluticasone nasal spray/nasal saline lavage (as above), and montelukast 10 mg daily at bedtime.   Return in about 4 months (around 04/01/2017), or if symptoms worsen or fail to improve.

## 2016-11-30 NOTE — Assessment & Plan Note (Addendum)
   Refill/restart Symbicort 160-4.5 g, 2 inhalations via spacer device twice a day.  To hasten symptom relief, prednisone has been provided, 20 mg x 4 days, 10 mg x1 day, then stop.  Continue Spiriva 18 mcg daily, montelukast 10 mg daily at bedtime and albuterol HFA, 1-2 inhalations every 4-6 hours as needed.  The patient has been asked to contact me if his symptoms persist or progress. Otherwise, he may return for follow up in 4 months.

## 2016-11-30 NOTE — Progress Notes (Signed)
Follow-up Note  RE: Douglas Hansen MRN: [id number] DOB: 1945-06-13 Date of Office Visit: 11/30/2016  Primary care provider: [name], MD Referring provider: [name], MD  History of present illness: Douglas Hansen is a 71 y.o. male with asthma/COPD and allergic rhinitis presenting today for a sick visit.  He was last seen in this clinic on 04/20/2016. He reports that over the past 4 days he has been experiencing wheezing, dyspnea, chest tightness, and coughing.  He admits that he ran out of Symbicort approximately 7-10 days ago and that his asthma had been well-controlled while using Symbicort 2 inhalations via spacer device twice a day.  He is also on Spiriva and continues to take that on a daily basis.  In addition, he complains of nasal congestion, sinus pressure, and irritated throat which is also been a problem over the past several days.  He denies fevers, chills, and discolored mucus production.  He is currently using fluticasone nasal spray once daily.  He is not using nasal saline.   Assessment and plan: Asthma with acute exacerbation  Refill/restart Symbicort 160-4.5 g, 2 inhalations via spacer device twice a day.  To hasten symptom relief, prednisone has been provided, 20 mg x 4 days, 10 mg x1 day, then stop.  Continue Spiriva 18 mcg daily, montelukast 10 mg daily at bedtime and albuterol HFA, 1-2 inhalations every 4-6 hours as needed.  The patient has been asked to contact me if his symptoms persist or progress. Otherwise, he may return for follow up in 4 months.  Acute sinusitis  Prednisone has been provided (as above).  For the next week or so, increase fluticasone nasal sprays to 2 sprays per nostril twice daily.  After, he may resume as needed use of this medication.  Nasal saline lavage (NeilMed) has been recommended as needed and prior to medicated nasal sprays along with instructions for proper administration.  For thick post nasal drainage, add guaifenesin  [phone] mg (Mucinex)  twice daily as needed with adequate hydration as discussed.  The patient has been asked to contact me if his symptoms persist, progress, or if he becomes febrile.  Other allergic rhinitis  Continue appropriate allergen avoidance measures, fluticasone nasal spray/nasal saline lavage (as above), and montelukast 10 mg daily at bedtime.   Meds ordered this encounter  Medications  . budesonide-formoterol (SYMBICORT) 160-4.5 MCG/ACT inhaler    Sig: Inhale 2 puffs into the lungs 2 (two) times daily.    Dispense:  1 Inhaler    Refill:  5  . fluticasone (FLONASE) 50 MCG/ACT nasal spray    Sig: Place 2 sprays into both nostrils daily.    Dispense:  16 g    Refill:  5  . montelukast (SINGULAIR) 10 MG tablet    Sig: Take 1 tablet (10 mg total) by mouth daily.    Dispense:  30 tablet    Refill:  5  . predniSONE (DELTASONE) tablet 10 mg    Diagnostics: Spirometry reveals an FVC of 1.28 L and an FEV1 of 0.85 L (37% predicted) with 11% postbronchodilator improvement.    Physical examination: Blood pressure 120/75, pulse 75, temperature 97.7 F (36.5 C), temperature source Oral, resp. rate 16, SpO2 98 %.  General: Alert, interactive, in no acute distress. HEENT: TMs pearly gray, turbinates edematous without discharge, post-pharynx moderately erythematous. Neck: Supple without lymphadenopathy. Lungs: Decreased breath sounds bilaterally without wheezing, rhonchi or rales. CV: Normal S1, S2 without murmurs. Skin: Warm and dry, without lesions or rashes.  The following portions of the patient's history were reviewed and updated as appropriate: allergies, current medications, past family history, past medical history, past social history, past surgical history and problem list.  Allergies as of 11/30/2016      Reactions   Codeine Other (See Comments)   REACTION: GI upset   Lipitor [atorvastatin]    Pravachol [pravastatin Sodium]       Medication List         Accurate as of 11/30/16  6:15 PM. Always use your most recent med list.          albuterol 108 (90 Base) MCG/ACT inhaler Commonly known as:  PROAIR HFA Inhale 2 puffs into the lungs every 4 (four) hours as needed for wheezing (or coughing.).   ALIGN 4 MG Caps Take 4 mg by mouth daily.   aspirin 325 MG tablet Take 325 mg by mouth daily.   AZOR 10-40 MG tablet Generic drug:  amLODipine-olmesartan Take 1 tablet by mouth daily.   BEANO PO Take 1 tablet by mouth 4 (four) times daily as needed (gas, bloating).   budesonide-formoterol 160-4.5 MCG/ACT inhaler Commonly known as:  SYMBICORT Inhale 2 puffs into the lungs 2 (two) times daily.   carvedilol 3.125 MG tablet Commonly known as:  COREG Take 3.125 mg by mouth 2 (two) times daily.   Cellulose Carmellose Sodium Powd Apply 1 application topically daily as needed (irritation). Apply to feet   clindamycin-benzoyl peroxide gel Commonly known as:  BENZACLIN Apply 1 application topically daily at 3 pm.   clobetasol cream 0.05 % Commonly known as:  TEMOVATE Apply 1 application topically daily as needed (hands, arms, neck, thighs).   clotrimazole 1 % cream Commonly known as:  LOTRIMIN Apply 1 application topically daily as needed (s). For skin condition   Diclofenac Sodium 1.5 % Soln Apply 12 drops topically 2 (two) times a week. Uses 12-14 drops on scalp as needed for dryness   fluocinolone 0.01 % cream Commonly known as:  VANOS Apply 1 application topically daily.   fluticasone 50 MCG/ACT nasal spray Commonly known as:  FLONASE Place 2 sprays into both nostrils daily.   FLUTTER Devi Use as directed   furosemide 20 MG tablet Commonly known as:  LASIX Take 1 tablet (20 mg total) by mouth daily.   ketoconazole 2 % cream Commonly known as:  NIZORAL Apply 1 application topically daily.   metFORMIN 500 MG tablet Commonly known as:  GLUCOPHAGE Take 1,000 mg by mouth Twice daily.   montelukast 10 MG  tablet Commonly known as:  SINGULAIR Take 1 tablet (10 mg total) by mouth daily.   MULTIVITAMIN & MINERAL PO Take 1 tablet by mouth daily. Reported on 06/17/2015   omeprazole 20 MG capsule Commonly known as:  PRILOSEC Take 20 mg by mouth daily.   SHINGRIX injection Generic drug:  Zoster Vac Recomb Adjuvanted inject 0.5 milliliter intramuscularly   simvastatin 20 MG tablet Commonly known as:  ZOCOR Take 20 mg by mouth every evening.   tamsulosin 0.4 MG Caps capsule Commonly known as:  FLOMAX Take 0.4 mg by mouth daily.   tiotropium 18 MCG inhalation capsule Commonly known as:  SPIRIVA Place 1 capsule (18 mcg total) into inhaler and inhale daily.   triamcinolone cream 0.1 % Commonly known as:  KENALOG Apply topically 2 (two) times daily. Reported on 06/17/2015   VESICARE 5 MG tablet Generic drug:  solifenacin Take 5 mg by mouth daily.   VICTOZA 18 MG/3ML Sopn Generic drug:  liraglutide Inject 0.6 mg into the skin daily.   vitamin C 1000 MG tablet Take 1,000 mg by mouth daily. Reported on 06/17/2015   VITAMIN D-1000 MAX ST 1000 units tablet Generic drug:  Cholecalciferol Take 1,000 Units by mouth daily. Reported on 06/17/2015   ZOLOFT PO Take by mouth.            Discharge Care Instructions        Start     Ordered   12/01/16 0800  PREDNISONE 1 MG PO TABS  Daily with breakfast     11/30/16 1235   11/30/16 0000  Spirometry with Graph    Question Answer Comment  Where should this test be performed? Other   Basic spirometry Yes   Spirometry pre & post bronchodilator No      11/30/16 1235   11/30/16 0000  budesonide-formoterol (SYMBICORT) 160-4.5 MCG/ACT inhaler  2 times daily     11/30/16 1235   11/30/16 0000  fluticasone (FLONASE) 50 MCG/ACT nasal spray  Daily     11/30/16 1235   11/30/16 0000  montelukast (SINGULAIR) 10 MG tablet  Daily     11/30/16 1235      Allergies  Allergen Reactions  . Codeine Other (See Comments)    REACTION: GI upset  .  Lipitor [Atorvastatin]   . Pravachol [Pravastatin Sodium]    Review of systems: Review of systems negative except as noted in HPI / PMHx or noted below: Constitutional: Negative.  HENT: Negative.   Eyes: Negative.  Respiratory: Negative.   Cardiovascular: Negative.  Gastrointestinal: Negative.  Genitourinary: Negative.  Musculoskeletal: Negative.  Neurological: Negative.  Endo/Heme/Allergies: Negative.  Cutaneous: Negative.  Past Medical History:  Diagnosis Date  . Agent orange exposure   . Arthritis   . Asthma   . COPD (chronic obstructive pulmonary disease) ([place])   . Diabetes mellitus   . Hyperlipemia   . Hypertension   . OSA on CPAP   . Pneumonia   . PTSD (post-traumatic stress disorder)     Family History  Problem Relation Age of Onset  . Emphysema Father   . Asthma Father   . Heart disease Father   . Heart attack Father   . Stroke Mother   . Heart attack Brother   . Diabetes Brother   . Heart attack Brother   . Diabetes Brother   . Colon cancer Neg Hx     Social History   Social History  . Marital status: Married    Spouse name: N/A  . Number of children: 5  . Years of education: N/A   Occupational History  . Retired    Social History Main Topics  . Smoking status: Former Smoker    Packs/day: 1.00    Years: 20.00    Types: Cigarettes    Quit date: 03/09/1985  . Smokeless tobacco: Never Used  . Alcohol use No  . Drug use: No  . Sexual activity: Not on file   Other Topics Concern  . Not on file   Social History Narrative   Epworth Sleepiness Scale = 6 (as of 04/17/2015)    I appreciate the opportunity to take part in Brazen's care. Please do not hesitate to contact me with questions.  Sincerely,   R. Edgar Frisk, MD

## 2016-11-30 NOTE — Assessment & Plan Note (Signed)
   Continue appropriate allergen avoidance measures, fluticasone nasal spray/nasal saline lavage (as above), and montelukast 10 mg daily at bedtime.

## 2016-12-04 ENCOUNTER — Telehealth: Payer: Self-pay

## 2016-12-04 NOTE — Telephone Encounter (Signed)
Lm for pt to call us back about recall of montelukast

## 2017-03-29 DIAGNOSIS — E118 Type 2 diabetes mellitus with unspecified complications: Secondary | ICD-10-CM | POA: Diagnosis not present

## 2017-03-29 DIAGNOSIS — I10 Essential (primary) hypertension: Secondary | ICD-10-CM | POA: Diagnosis not present

## 2017-03-29 DIAGNOSIS — E785 Hyperlipidemia, unspecified: Secondary | ICD-10-CM | POA: Diagnosis not present

## 2017-04-05 ENCOUNTER — Ambulatory Visit: Payer: Medicare Other | Admitting: Allergy and Immunology

## 2017-04-05 DIAGNOSIS — E118 Type 2 diabetes mellitus with unspecified complications: Secondary | ICD-10-CM | POA: Diagnosis not present

## 2017-04-05 DIAGNOSIS — Z0000 Encounter for general adult medical examination without abnormal findings: Secondary | ICD-10-CM | POA: Diagnosis not present

## 2017-04-05 DIAGNOSIS — E785 Hyperlipidemia, unspecified: Secondary | ICD-10-CM | POA: Diagnosis not present

## 2017-04-05 DIAGNOSIS — R351 Nocturia: Secondary | ICD-10-CM | POA: Diagnosis not present

## 2017-04-05 DIAGNOSIS — E291 Testicular hypofunction: Secondary | ICD-10-CM | POA: Diagnosis not present

## 2017-04-05 DIAGNOSIS — I10 Essential (primary) hypertension: Secondary | ICD-10-CM | POA: Diagnosis not present

## 2017-04-12 ENCOUNTER — Encounter: Payer: Self-pay | Admitting: Allergy and Immunology

## 2017-04-12 ENCOUNTER — Ambulatory Visit (INDEPENDENT_AMBULATORY_CARE_PROVIDER_SITE_OTHER): Payer: Medicare Other | Admitting: Allergy and Immunology

## 2017-04-12 VITALS — BP 138/76 | HR 76 | Temp 99.0°F | Resp 18

## 2017-04-12 DIAGNOSIS — J449 Chronic obstructive pulmonary disease, unspecified: Secondary | ICD-10-CM | POA: Diagnosis not present

## 2017-04-12 DIAGNOSIS — J3089 Other allergic rhinitis: Secondary | ICD-10-CM | POA: Diagnosis not present

## 2017-04-12 DIAGNOSIS — K219 Gastro-esophageal reflux disease without esophagitis: Secondary | ICD-10-CM

## 2017-04-12 MED ORDER — TIOTROPIUM BROMIDE MONOHYDRATE 1.25 MCG/ACT IN AERS: 2.0000 | INHALATION_SPRAY | Freq: Every day | RESPIRATORY_TRACT | 5 refills | Status: DC

## 2017-04-12 MED ORDER — AZELASTINE HCL 0.1 % NA SOLN: 1.0000 | Freq: Two times a day (BID) | NASAL | 5 refills | Status: DC

## 2017-04-12 MED ORDER — PREDNISONE 1 MG PO TABS: 10.0000 mg | ORAL_TABLET | Freq: Every day | ORAL | Status: AC

## 2017-04-12 NOTE — Assessment & Plan Note (Addendum)
   Continue appropriate allergen avoidance measures and montelukast 10 mg daily at bedtime..    A prescription has been provided for azelastine nasal spray, 1-2 sprays per nostril 2 times daily as needed.   Continue fluticasone nasal spray, 2 sprays per nostril daily.  Nasal saline lavage (NeilMed) has been recommended as needed and prior to medicated nasal sprays along with instructions for proper administration.

## 2017-04-12 NOTE — Progress Notes (Signed)
Follow-up Note  RE: Douglas Hansen MRN: [id number] DOB: Jul 11, 1945 Date of Office Visit: 04/12/2017  Primary care provider: [name], MD Referring provider: [name], MD  History of present illness: Douglas Hansen is a 72 y.o. male with asthma/COPD and allergic rhinitis presenting today for a sick visit.  He was last seen in this clinic on November 30, 2016.  He reports that, despite compliance with Symbicort 160-4.5 g 2 inhalations via spacer twice twice daily and Spiriva HandiHaler 18 g daily, he recently has been experiencing nocturnal awakenings due to lower respiratory symptoms requiring albuterol rescue 3 or 4 nights per week.  He is using fluticasone nasal spray daily, however still experiencing rhinorrhea and some postnasal drainage.  He has reflux related complaints today.   Assessment and plan: Asthma with COPD ([place]) Currently with sub-optimal control.  Prednisone has been provided, 20 mg x 4 days, 10 mg x1 day, then stop.  A prescription has been provided for Spiriva Respimat 1.25 g, 2 inhalations daily.  Discontinue Spiriva HandiHaler.  Continue Symbicort 160-4.5 g, 2 inhalations via spacer device twice daily, montelukast 10 mg daily bedtime, and albuterol every 6 hours if needed.  The patient has been asked to contact me if his symptoms persist or progress. Otherwise, he may return for follow up in 4 months.  Other allergic rhinitis  Continue appropriate allergen avoidance measures and montelukast 10 mg daily at bedtime..    A prescription has been provided for azelastine nasal spray, 1-2 sprays per nostril 2 times daily as needed.   Continue fluticasone nasal spray, 2 sprays per nostril daily.  Nasal saline lavage (NeilMed) has been recommended as needed and prior to medicated nasal sprays along with instructions for proper administration.  Gastroesophageal reflux disease without esophagitis  Continue appropriate reflux list on modifications and omeprazole  as prescribed.   Meds ordered this encounter  Medications  . Tiotropium Bromide Monohydrate (SPIRIVA RESPIMAT) 1.25 MCG/ACT AERS    Sig: Inhale 2 puffs into the lungs daily.    Dispense:  4 g    Refill:  5  . azelastine (ASTELIN) 0.1 % nasal spray    Sig: Place 1-2 sprays into both nostrils 2 (two) times daily.    Dispense:  30 mL    Refill:  5  . predniSONE (DELTASONE) tablet 10 mg    Diagnostics: Spirometry reveals an FVC of 1.50 L (45% predicted) and an FEV1 of 1.10 L (45% predicted) without postbronchodilator improvement.  Severe restrictive pattern without reversibility.  Please see scanned spirometry results for details.    Physical examination: Blood pressure 138/76, pulse 76, temperature 99 F (37.2 C), resp. rate 18.  General: Alert, interactive, in no acute distress. HEENT: TMs pearly gray, turbinates mildly edematous without discharge, post-pharynx mildly erythematous. Neck: Supple without lymphadenopathy. Lungs: Decreased breath sounds bilaterally without wheezing, rhonchi or rales. CV: Normal S1, S2 without murmurs. Skin: Warm and dry, without lesions or rashes.  The following portions of the patient's history were reviewed and updated as appropriate: allergies, current medications, past family history, past medical history, past social history, past surgical history and problem list.  Allergies as of 04/12/2017      Reactions   Codeine Other (See Comments)   REACTION: GI upset   Lipitor [atorvastatin]    Pravachol [pravastatin Sodium]       Medication List        Accurate as of 04/12/17 11:59 PM. Always use your most recent med list.  albuterol 108 (90 Base) MCG/ACT inhaler Commonly known as:  PROAIR HFA Inhale 2 puffs into the lungs every 4 (four) hours as needed for wheezing (or coughing.).   ALIGN 4 MG Caps Take 4 mg by mouth daily.   aspirin 325 MG tablet Take 325 mg by mouth daily.   azelastine 0.1 % nasal spray Commonly known as:   ASTELIN Place 1-2 sprays into both nostrils 2 (two) times daily.   AZOR 10-40 MG tablet Generic drug:  amLODipine-olmesartan Take 1 tablet by mouth daily.   BEANO PO Take 1 tablet by mouth 4 (four) times daily as needed (gas, bloating).   budesonide-formoterol 160-4.5 MCG/ACT inhaler Commonly known as:  SYMBICORT Inhale 2 puffs into the lungs 2 (two) times daily.   carvedilol 3.125 MG tablet Commonly known as:  COREG Take 3.125 mg by mouth 2 (two) times daily.   Cellulose Carmellose Sodium Powd Apply 1 application topically daily as needed (irritation). Apply to feet   clindamycin-benzoyl peroxide gel Commonly known as:  BENZACLIN Apply 1 application topically daily at 3 pm.   clobetasol cream 0.05 % Commonly known as:  TEMOVATE Apply 1 application topically daily as needed (hands, arms, neck, thighs).   clotrimazole 1 % cream Commonly known as:  LOTRIMIN Apply 1 application topically daily as needed (s). For skin condition   Diclofenac Sodium 1.5 % Soln Apply 12 drops topically 2 (two) times a week. Uses 12-14 drops on scalp as needed for dryness   fluocinolone 0.01 % cream Commonly known as:  VANOS Apply 1 application topically daily.   fluticasone 50 MCG/ACT nasal spray Commonly known as:  FLONASE Place 2 sprays into both nostrils daily.   FLUTTER Devi Use as directed   furosemide 20 MG tablet Commonly known as:  LASIX Take 1 tablet (20 mg total) by mouth daily.   ketoconazole 2 % cream Commonly known as:  NIZORAL Apply 1 application topically daily.   metFORMIN 500 MG tablet Commonly known as:  GLUCOPHAGE Take 1,000 mg by mouth Twice daily.   MULTIVITAMIN & MINERAL PO Take 1 tablet by mouth daily. Reported on 06/17/2015   MYRBETRIQ 25 MG Tb24 tablet Generic drug:  mirabegron ER   omeprazole 20 MG capsule Commonly known as:  PRILOSEC Take 20 mg by mouth daily.   SHINGRIX injection Generic drug:  Zoster Vaccine Adjuvanted inject 0.5 milliliter  intramuscularly   simvastatin 20 MG tablet Commonly known as:  ZOCOR Take 20 mg by mouth every evening.   tiotropium 18 MCG inhalation capsule Commonly known as:  SPIRIVA Place 1 capsule (18 mcg total) into inhaler and inhale daily.   Tiotropium Bromide Monohydrate 1.25 MCG/ACT Aers Commonly known as:  SPIRIVA RESPIMAT Inhale 2 puffs into the lungs daily.   triamcinolone cream 0.1 % Commonly known as:  KENALOG Apply topically 2 (two) times daily. Reported on 06/17/2015   VICTOZA 18 MG/3ML Sopn Generic drug:  liraglutide Inject 0.6 mg into the skin daily.   vitamin C 1000 MG tablet Take 1,000 mg by mouth daily. Reported on 06/17/2015   VITAMIN D-1000 MAX ST 1000 units tablet Generic drug:  Cholecalciferol Take 1,000 Units by mouth daily. Reported on 06/17/2015   ZOLOFT PO Take by mouth.       Allergies  Allergen Reactions  . Codeine Other (See Comments)    REACTION: GI upset  . Lipitor [Atorvastatin]   . Pravachol [Pravastatin Sodium]    Review of systems: Review of systems negative except as noted in HPI /  PMHx or noted below: Constitutional: Negative.  HENT: Negative.   Eyes: Negative.  Respiratory: Negative.   Cardiovascular: Negative.  Gastrointestinal: Negative.  Genitourinary: Negative.  Musculoskeletal: Negative.  Neurological: Negative.  Endo/Heme/Allergies: Negative.  Cutaneous: Negative.  Past Medical History:  Diagnosis Date  . Agent orange exposure   . Arthritis   . Asthma   . COPD (chronic obstructive pulmonary disease) ([place])   . Diabetes mellitus   . Hyperlipemia   . Hypertension   . OSA on CPAP   . Pneumonia   . PTSD (post-traumatic stress disorder)     Family History  Problem Relation Age of Onset  . Emphysema Father   . Asthma Father   . Heart disease Father   . Heart attack Father   . Stroke Mother   . Heart attack Brother   . Diabetes Brother   . Heart attack Brother   . Diabetes Brother   . Colon cancer Neg Hx      Social History   Socioeconomic History  . Marital status: Married    Spouse name: Not on file  . Number of children: 5  . Years of education: Not on file  . Highest education level: Not on file  Social Needs  . Financial resource strain: Not on file  . Food insecurity - worry: Not on file  . Food insecurity - inability: Not on file  . Transportation needs - medical: Not on file  . Transportation needs - non-medical: Not on file  Occupational History  . Occupation: Retired  Tobacco Use  . Smoking status: Former Smoker    Packs/day: 1.00    Years: 20.00    Pack years: 20.00    Types: Cigarettes    Last attempt to quit: 03/09/1985    Years since quitting: 32.1  . Smokeless tobacco: Never Used  Substance and Sexual Activity  . Alcohol use: No  . Drug use: No  . Sexual activity: Not on file  Other Topics Concern  . Not on file  Social History Narrative   Epworth Sleepiness Scale = 6 (as of 04/17/2015)    I appreciate the opportunity to take part in Olson's care. Please do not hesitate to contact me with questions.  Sincerely,   R. Edgar Frisk, MD

## 2017-04-12 NOTE — Patient Instructions (Addendum)
Asthma with COPD ([place]) Currently with sub-optimal control.  Prednisone has been provided, 20 mg x 4 days, 10 mg x1 day, then stop.  A prescription has been provided for Spiriva Respimat 1.25 g, 2 inhalations daily.  Discontinue Spiriva HandiHaler.  Continue Symbicort 160-4.5 g, 2 inhalations via spacer device twice daily, montelukast 10 mg daily bedtime, and albuterol every 6 hours if needed.  The patient has been asked to contact me if his symptoms persist or progress. Otherwise, he may return for follow up in 4 months.  Other allergic rhinitis  Continue appropriate allergen avoidance measures and montelukast 10 mg daily at bedtime..    A prescription has been provided for azelastine nasal spray, 1-2 sprays per nostril 2 times daily as needed.   Continue fluticasone nasal spray, 2 sprays per nostril daily.  Nasal saline lavage (NeilMed) has been recommended as needed and prior to medicated nasal sprays along with instructions for proper administration.  Gastroesophageal reflux disease without esophagitis  Continue appropriate reflux list on modifications and omeprazole as prescribed.   Return in about 4 months (around 08/10/2017), or if symptoms worsen or fail to improve.

## 2017-04-12 NOTE — Assessment & Plan Note (Addendum)
Currently with sub-optimal control.  Prednisone has been provided, 20 mg x 4 days, 10 mg x1 day, then stop.  A prescription has been provided for Spiriva Respimat 1.25 g, 2 inhalations daily.  Discontinue Spiriva HandiHaler.  Continue Symbicort 160-4.5 g, 2 inhalations via spacer device twice daily, montelukast 10 mg daily bedtime, and albuterol every 6 hours if needed.  The patient has been asked to contact me if his symptoms persist or progress. Otherwise, he may return for follow up in 4 months.

## 2017-04-12 NOTE — Assessment & Plan Note (Signed)
   Continue appropriate reflux list on modifications and omeprazole as prescribed. 

## 2017-04-13 ENCOUNTER — Encounter: Payer: Self-pay | Admitting: Allergy and Immunology

## 2017-04-30 DIAGNOSIS — E213 Hyperparathyroidism, unspecified: Secondary | ICD-10-CM | POA: Diagnosis not present

## 2017-05-06 DIAGNOSIS — E213 Hyperparathyroidism, unspecified: Secondary | ICD-10-CM | POA: Diagnosis not present

## 2017-05-06 DIAGNOSIS — M859 Disorder of bone density and structure, unspecified: Secondary | ICD-10-CM | POA: Diagnosis not present

## 2017-06-05 ENCOUNTER — Ambulatory Visit (HOSPITAL_COMMUNITY)
Admission: EM | Admit: 2017-06-05 | Discharge: 2017-06-05 | Disposition: A | Discharge status: Home / Self Care | Payer: Medicare Other | Attending: Radiology | Admitting: Radiology

## 2017-06-05 ENCOUNTER — Encounter (HOSPITAL_COMMUNITY): Payer: Self-pay | Admitting: Emergency Medicine

## 2017-06-05 ENCOUNTER — Other Ambulatory Visit: Payer: Self-pay

## 2017-06-05 DIAGNOSIS — R69 Illness, unspecified: Secondary | ICD-10-CM

## 2017-06-05 DIAGNOSIS — J111 Influenza due to unidentified influenza virus with other respiratory manifestations: Secondary | ICD-10-CM

## 2017-06-05 MED ORDER — OSELTAMIVIR PHOSPHATE 75 MG PO CAPS: 75.0000 mg | ORAL_CAPSULE | Freq: Two times a day (BID) | ORAL | 0 refills | Status: DC

## 2017-06-05 NOTE — Discharge Instructions (Addendum)
Continue to push fluids and take over the counter medications as directed on the back of the box for symptomatic relief.  ° °

## 2017-06-05 NOTE — ED Triage Notes (Signed)
Pt reports cough, sore throat, and headache that started last night.  Pt states his wife was hospitalized with the flu on Monday.

## 2017-06-05 NOTE — ED Provider Notes (Signed)
[facility]    CSN: [id number] Arrival date & time: 06/05/17  1259     History   Chief Complaint Chief Complaint  Patient presents with  . URI    HPI Douglas Hansen is a 72 y.o. male.   72 y.o. male presents with sudden onset of headache generalized weakness, cough and sore throat with subjective fevers X. Condition is acute in nature. Condition is made better by nothing. Condition is made worse by nothing. Patient denies any relief from OTC medications for cold and flu prior to there arrival at this facility. Patient states that his wife is currently in the hospital with the flu.       Past Medical History:  Diagnosis Date  . Agent orange exposure   . Arthritis   . Asthma   . COPD (chronic obstructive pulmonary disease) ([place])   . Diabetes mellitus   . Hyperlipemia   . Hypertension   . OSA on CPAP   . Pneumonia   . PTSD (post-traumatic stress disorder)     Patient Active Problem List   Diagnosis Date Noted  . Dyslipidemia 06/03/2016  . Other fatigue 06/03/2016  . Claudication ([place]) 06/03/2016  . Acute sinusitis 12/16/2015  . Asthma with acute exacerbation 06/17/2015  . Acute bronchitis 06/17/2015  . Gastroesophageal reflux disease without esophagitis 06/17/2015  . Shortness of breath 04/17/2015  . Acute pulmonary edema ([place]) 04/17/2015  . Chronic obstructive pulmonary disease, unspecified copd, unspecified chronic bronchitis type 01/07/2015  . Moderate persistent asthma 01/07/2015  . Other allergic rhinitis 01/07/2015  . Obstructive sleep apnea treated with continuous positive airway pressure (CPAP) 01/07/2015  . Asthma with COPD ([place])     Class: Chronic  . Pneumonia due to other gram-negative bacteria ([place]) 01/30/2011  . Cough 06/09/2010  . COPD (chronic obstructive pulmonary disease) ([place]) 06/09/2010  . OSA on CPAP   . Diabetes mellitus   . Hyperlipemia   . Essential hypertension     Past Surgical History:  Procedure Laterality Date  .  CARDIAC CATHETERIZATION    . right thumb surgery    . TONSILLECTOMY         Home Medications    Prior to Admission medications   Medication Sig Start Date End Date Taking? Authorizing Provider  albuterol (PROAIR HFA) 108 (90 Base) MCG/ACT inhaler Inhale 2 puffs into the lungs every 4 (four) hours as needed for wheezing (or coughing.). 06/17/15  Yes Bardelas, Jose A, MD  Alpha-D-Galactosidase (BEANO PO) Take 1 tablet by mouth 4 (four) times daily as needed (gas, bloating).    Yes [provider]  amLODipine-olmesartan (AZOR) 10-40 MG per tablet Take 1 tablet by mouth daily.     Yes [provider]  Ascorbic Acid (VITAMIN C) 1000 MG tablet Take 1,000 mg by mouth daily. Reported on 06/17/2015   Yes [provider]  aspirin 325 MG tablet Take 325 mg by mouth daily.     Yes [provider]  azelastine (ASTELIN) 0.1 % nasal spray Place 1-2 sprays into both nostrils 2 (two) times daily. 04/12/17  Yes Bobbitt, Sedalia Muta, MD  budesonide-formoterol [facility]) 160-4.5 MCG/ACT inhaler Inhale 2 puffs into the lungs 2 (two) times daily. 11/30/16  Yes Bobbitt, Sedalia Muta, MD  carvedilol (COREG) 3.125 MG tablet Take 3.125 mg by mouth 2 (two) times daily.  11/29/14  Yes [provider]  Cellulose Carmellose Sodium POWD Apply 1 application topically daily as needed (irritation). Apply to feet   Yes [provider]  Cholecalciferol (VITAMIN D-1000 MAX ST) 1000 UNITS tablet Take 1,000 Units by mouth daily. Reported on 06/17/2015   Yes [provider]  clindamycin-benzoyl peroxide (BENZACLIN) gel Apply 1 application topically daily at 3 pm.    Yes [provider]  clobetasol (TEMOVATE) 0.05 % cream Apply 1 application topically daily as needed (hands, arms, neck, thighs).    Yes [provider]  clotrimazole (LOTRIMIN) 1 % cream Apply 1 application topically daily as needed (s). For skin condition   Yes [provider]    Diclofenac Sodium 1.5 % SOLN Apply 12 drops topically 2 (two) times a week. Uses 12-14 drops on scalp as needed for dryness 10/17/14  Yes [provider]  fluocinolone (VANOS) 0.01 % cream Apply 1 application topically daily.    Yes [provider]  fluticasone (FLONASE) 50 MCG/ACT nasal spray Place 2 sprays into both nostrils daily. 11/30/16  Yes Bobbitt, Sedalia Muta, MD  furosemide (LASIX) 20 MG tablet Take 1 tablet (20 mg total) by mouth daily. 05/09/15  Yes Hilty, Nadean Corwin, MD  ketoconazole (NIZORAL) 2 % cream Apply 1 application topically daily.    Yes [provider]  metFORMIN (GLUCOPHAGE) 500 MG tablet Take 1,000 mg by mouth Twice daily.  07/28/10  Yes [provider]  Multiple Vitamins-Minerals (MULTIVITAMIN & MINERAL PO) Take 1 tablet by mouth daily. Reported on 06/17/2015   Yes [provider]  MYRBETRIQ 25 MG TB24 tablet  04/07/17  Yes [provider]  omeprazole (PRILOSEC) 20 MG capsule Take 20 mg by mouth daily.   Yes [provider]  Probiotic Product (ALIGN) 4 MG CAPS Take 4 mg by mouth daily.    Yes [provider]  Respiratory Therapy Supplies (FLUTTER) DEVI Use as directed 04/20/16  Yes Bobbitt, Sedalia Muta, MD  Sertraline HCl (ZOLOFT PO) Take by mouth.   Yes [provider]  [facility] injection inject 0.5 milliliter intramuscularly 09/21/16  Yes [provider]  simvastatin (ZOCOR) 20 MG tablet Take 20 mg by mouth every evening.   Yes [provider]  tiotropium (SPIRIVA) 18 MCG inhalation capsule Place 1 capsule (18 mcg total) into inhaler and inhale daily. 04/20/16  Yes Bobbitt, Sedalia Muta, MD  Tiotropium Bromide Monohydrate (SPIRIVA RESPIMAT) 1.25 MCG/ACT AERS Inhale 2 puffs into the lungs daily. 04/12/17  Yes Bobbitt, Sedalia Muta, MD  triamcinolone (KENALOG) 0.1 % cream Apply topically 2 (two) times daily. Reported on 06/17/2015   Yes [provider]  VICTOZA 18 MG/3ML SOPN  Inject 0.6 mg into the skin daily. 12/24/14  Yes [provider]    Family History Family History  Problem Relation Age of Onset  . Emphysema Father   . Asthma Father   . Heart disease Father   . Heart attack Father   . Stroke Mother   . Heart attack Brother   . Diabetes Brother   . Heart attack Brother   . Diabetes Brother   . Colon cancer Neg Hx     Social History Social History   Tobacco Use  . Smoking status: Former Smoker    Packs/day: 1.00    Years: 20.00    Pack years: 20.00    Types: Cigarettes    Last attempt to quit: 03/09/1985    Years since quitting: 32.2  . Smokeless tobacco: Never Used  Substance Use Topics  . Alcohol use: No  . Drug use: No     Allergies   Codeine; Lipitor [atorvastatin]; and Pravachol [pravastatin sodium]  Review of Systems Review of Systems  Constitutional: Positive for fatigue and fever ( subjective). Negative for chills.  HENT: Positive for sore throat. Negative for ear pain.   Eyes: Negative for pain and visual disturbance.  Respiratory: Positive for cough. Negative for shortness of breath.   Cardiovascular: Negative for chest pain and palpitations.  Gastrointestinal: Negative for abdominal pain and vomiting.  Genitourinary: Negative for dysuria and hematuria.  Musculoskeletal: Negative for arthralgias and back pain.  Skin: Negative for color change and rash.  Neurological: Positive for headaches. Negative for seizures and syncope.  All other systems reviewed and are negative.    Physical Exam Triage Vital Signs ED Triage Vitals  Enc Vitals Group     BP 06/05/17 1420 (!) 137/93     Pulse Rate 06/05/17 1420 70     Resp --      Temp 06/05/17 1420 98.1 F (36.7 C)     Temp Source 06/05/17 1420 Oral     SpO2 06/05/17 1420 96 %     Weight --      Height --      Head Circumference --      Peak Flow --      Pain Score 06/05/17 1421 4     Pain Loc --      Pain Edu? --      Excl. in [place]? --    No data  found.  Updated Vital Signs BP (!) 137/93 (BP Location: Left Arm)   Pulse 70   Temp 98.1 F (36.7 C) (Oral)   SpO2 96%   Visual Acuity Right Eye Distance:   Left Eye Distance:   Bilateral Distance:    Right Eye Near:   Left Eye Near:    Bilateral Near:     Physical Exam  Constitutional: He is oriented to person, place, and time. He appears well-developed and well-nourished.  HENT:  Head: Normocephalic.  Neck: Normal range of motion.  Cardiovascular: Normal rate and regular rhythm.  Pulmonary/Chest: Effort normal and breath sounds normal.  Musculoskeletal: Normal range of motion.  Neurological: He is alert and oriented to person, place, and time.  Skin: Skin is dry.  Psychiatric: He has a normal mood and affect.  Nursing note and vitals reviewed.    UC Treatments / Results  Labs (all labs ordered are listed, but only abnormal results are displayed) Labs Reviewed - No data to display  EKG None Radiology No results found.  Procedures Procedures (including critical care time)  Medications Ordered in UC Medications - No data to display   Initial Impression / Assessment and Plan / UC Course  I have reviewed the triage vital signs and the nursing notes.  Pertinent labs & imaging results that were available during my care of the patient were reviewed by me and considered in my medical decision making (see chart for details).       Final Clinical Impressions(s) / UC Diagnoses   Final diagnoses:  None    ED Discharge Orders    None       Controlled Substance Prescriptions [place] Controlled Substance Registry consulted? Not Applicable   Jacqualine Mau, NP 06/05/17 1441

## 2017-07-06 ENCOUNTER — Encounter (HOSPITAL_COMMUNITY): Payer: Self-pay | Admitting: Emergency Medicine

## 2017-07-06 ENCOUNTER — Ambulatory Visit (HOSPITAL_COMMUNITY)
Admission: EM | Admit: 2017-07-06 | Discharge: 2017-07-06 | Disposition: A | Discharge status: Home / Self Care | Payer: Medicare Other | Attending: Emergency Medicine | Admitting: Emergency Medicine

## 2017-07-06 ENCOUNTER — Ambulatory Visit (INDEPENDENT_AMBULATORY_CARE_PROVIDER_SITE_OTHER): Payer: Medicare Other

## 2017-07-06 DIAGNOSIS — J111 Influenza due to unidentified influenza virus with other respiratory manifestations: Secondary | ICD-10-CM

## 2017-07-06 DIAGNOSIS — J441 Chronic obstructive pulmonary disease with (acute) exacerbation: Secondary | ICD-10-CM

## 2017-07-06 DIAGNOSIS — R05 Cough: Secondary | ICD-10-CM | POA: Diagnosis not present

## 2017-07-06 MED ORDER — AZITHROMYCIN 250 MG PO TABS: 250.0000 mg | ORAL_TABLET | Freq: Every day | ORAL | 0 refills | Status: DC

## 2017-07-06 MED ORDER — PREDNISONE 20 MG PO TABS: 40.0000 mg | ORAL_TABLET | Freq: Every day | ORAL | 0 refills | Status: DC

## 2017-07-06 MED ORDER — OSELTAMIVIR PHOSPHATE 75 MG PO CAPS: 75.0000 mg | ORAL_CAPSULE | Freq: Two times a day (BID) | ORAL | 0 refills | Status: DC

## 2017-07-06 NOTE — ED Provider Notes (Signed)
HPI  SUBJECTIVE:  Douglas Hansen is a 72 y.o. male who presents with fevers T-max 101, body aches, headaches, sore throat, nasal congestion, rhinorrhea, postnasal drip, sinus pain and pressure starting yesterday.  He reports cough productive of greenish-brown sputum.  He states that he is coughing up more than his baseline.  He reports wheezing, shortness of breath, dyspnea on exertion to 200 feet.  He denies upper dental pain, chest pain, lower extremity edema. No antibiotics in the past 3 months.  He tried Tylenol, Tessalon cough syrup, vitamin C, Delsym, mucus relief and his albuterol inhaler twice.  The Tylenol and albuterol help.  Symptoms are worse with coughing.  He got a flu shot this year.  No known contacts with the flu.  He took Tylenol within 6 to 8 hours prior to evaluation.  He has a past medical history of diabetes, COPD, asthma, states that he is compliant with Symbicort twice daily and normally uses albuterol as needed.  He states that he has a spacer which he is using.  Also history of pneumonia, pulmonary edema, hypertension.  No history of CHF, kidney disease, GI bleed.  PMD: [name], MD     Past Medical History:  Diagnosis Date  . Agent orange exposure   . Arthritis   . Asthma   . COPD (chronic obstructive pulmonary disease) ([place])   . Diabetes mellitus   . Hyperlipemia   . Hypertension   . OSA on CPAP   . Pneumonia   . PTSD (post-traumatic stress disorder)     Past Surgical History:  Procedure Laterality Date  . CARDIAC CATHETERIZATION    . right thumb surgery    . TONSILLECTOMY      Family History  Problem Relation Age of Onset  . Emphysema Father   . Asthma Father   . Heart disease Father   . Heart attack Father   . Stroke Mother   . Heart attack Brother   . Diabetes Brother   . Heart attack Brother   . Diabetes Brother   . Colon cancer Neg Hx     Social History   Tobacco Use  . Smoking status: Former Smoker    Packs/day: 1.00    Years: 20.00     Pack years: 20.00    Types: Cigarettes    Last attempt to quit: 03/09/1985    Years since quitting: 32.3  . Smokeless tobacco: Never Used  Substance Use Topics  . Alcohol use: No  . Drug use: No    No current facility-administered medications for this encounter.   Current Outpatient Medications:  .  albuterol (PROAIR HFA) 108 (90 Base) MCG/ACT inhaler, Inhale 2 puffs into the lungs every 4 (four) hours as needed for wheezing (or coughing.)., Disp: 1 Inhaler, Rfl: 1 .  Alpha-D-Galactosidase (BEANO PO), Take 1 tablet by mouth 4 (four) times daily as needed (gas, bloating). , Disp: , Rfl:  .  amLODipine-olmesartan (AZOR) 10-40 MG per tablet, Take 1 tablet by mouth daily.  , Disp: , Rfl:  .  Ascorbic Acid (VITAMIN C) 1000 MG tablet, Take 1,000 mg by mouth daily. Reported on 06/17/2015, Disp: , Rfl:  .  aspirin 325 MG tablet, Take 325 mg by mouth daily.  , Disp: , Rfl:  .  azelastine (ASTELIN) 0.1 % nasal spray, Place 1-2 sprays into both nostrils 2 (two) times daily., Disp: 30 mL, Rfl: 5 .  azithromycin (ZITHROMAX) 250 MG tablet, Take 1 tablet (250 mg total) by mouth daily. 2  tabs po on day 1, 1 tab po on days 2-5, Disp: 6 tablet, Rfl: 0 .  budesonide-formoterol (SYMBICORT) 160-4.5 MCG/ACT inhaler, Inhale 2 puffs into the lungs 2 (two) times daily., Disp: 1 Inhaler, Rfl: 5 .  carvedilol (COREG) 3.125 MG tablet, Take 3.125 mg by mouth 2 (two) times daily. , Disp: , Rfl: 1 .  Cellulose Carmellose Sodium POWD, Apply 1 application topically daily as needed (irritation). Apply to feet, Disp: , Rfl:  .  Cholecalciferol (VITAMIN D-1000 MAX ST) 1000 UNITS tablet, Take 1,000 Units by mouth daily. Reported on 06/17/2015, Disp: , Rfl:  .  clindamycin-benzoyl peroxide (BENZACLIN) gel, Apply 1 application topically daily at 3 pm. , Disp: , Rfl:  .  clobetasol (TEMOVATE) 0.05 % cream, Apply 1 application topically daily as needed (hands, arms, neck, thighs). , Disp: , Rfl:  .  clotrimazole (LOTRIMIN) 1 %  cream, Apply 1 application topically daily as needed (s). For skin condition, Disp: , Rfl:  .  Diclofenac Sodium 1.5 % SOLN, Apply 12 drops topically 2 (two) times a week. Uses 12-14 drops on scalp as needed for dryness, Disp: , Rfl: 0 .  fluocinolone (VANOS) 0.01 % cream, Apply 1 application topically daily. , Disp: , Rfl:  .  fluticasone (FLONASE) 50 MCG/ACT nasal spray, Place 2 sprays into both nostrils daily., Disp: 16 g, Rfl: 5 .  furosemide (LASIX) 20 MG tablet, Take 1 tablet (20 mg total) by mouth daily., Disp: 90 tablet, Rfl: 3 .  ketoconazole (NIZORAL) 2 % cream, Apply 1 application topically daily. , Disp: , Rfl:  .  metFORMIN (GLUCOPHAGE) 500 MG tablet, Take 1,000 mg by mouth Twice daily. , Disp: , Rfl:  .  Multiple Vitamins-Minerals (MULTIVITAMIN & MINERAL PO), Take 1 tablet by mouth daily. Reported on 06/17/2015, Disp: , Rfl:  .  MYRBETRIQ 25 MG TB24 tablet, , Disp: , Rfl: 0 .  omeprazole (PRILOSEC) 20 MG capsule, Take 20 mg by mouth daily., Disp: , Rfl:  .  oseltamivir (TAMIFLU) 75 MG capsule, Take 1 capsule (75 mg total) by mouth 2 (two) times daily. X 5 days, Disp: 10 capsule, Rfl: 0 .  Probiotic Product (ALIGN) 4 MG CAPS, Take 4 mg by mouth daily. , Disp: , Rfl:  .  Respiratory Therapy Supplies (FLUTTER) DEVI, Use as directed, Disp: 1 each, Rfl: 0 .  Sertraline HCl (ZOLOFT PO), Take by mouth., Disp: , Rfl:  .  SHINGRIX injection, inject 0.5 milliliter intramuscularly, Disp: , Rfl: 0 .  simvastatin (ZOCOR) 20 MG tablet, Take 20 mg by mouth every evening., Disp: , Rfl:  .  tiotropium (SPIRIVA) 18 MCG inhalation capsule, Place 1 capsule (18 mcg total) into inhaler and inhale daily., Disp: 30 capsule, Rfl: 3 .  Tiotropium Bromide Monohydrate (SPIRIVA RESPIMAT) 1.25 MCG/ACT AERS, Inhale 2 puffs into the lungs daily., Disp: 4 g, Rfl: 5 .  triamcinolone (KENALOG) 0.1 % cream, Apply topically 2 (two) times daily. Reported on 06/17/2015, Disp: , Rfl:  .  VICTOZA 18 MG/3ML SOPN, Inject 0.6 mg  into the skin daily., Disp: , Rfl: 0  Allergies  Allergen Reactions  . Codeine Other (See Comments)    REACTION: GI upset  . Lipitor [Atorvastatin]   . Pravachol [Pravastatin Sodium]      ROS  As noted in HPI.   Physical Exam  BP 105/75 (BP Location: Left Arm)   Pulse 88   Temp 98.5 F (36.9 C) (Oral)   Resp 16   SpO2 95%  Constitutional: Well developed, well nourished, no acute distress Eyes: PERRL, EOMI, conjunctiva normal bilaterally HENT: Normocephalic, atraumatic,mucus membranes moist. no nasal congestion.  No sinus tenderness.  Normal turbinates.  Normal oropharynx. Neck: No cervical lymphadenopathy, meningismus Respiratory: Clear to auscultation bilaterally, no rales, no wheezing, no rhonchi Cardiovascular: Normal rate and rhythm, no murmurs, no gallops, no rubs GI: Soft, nondistended, normal bowel sounds, nontender, no rebound, no guarding Back: no CVAT skin: No rash, skin intact Musculoskeletal: No edema, no tenderness, no deformities Neurologic: Alert & oriented x 3, CN II-XII grossly intact, no motor deficits, sensation grossly intact Psychiatric: Speech and behavior appropriate   ED Course   Medications - No data to display  Orders Placed This Encounter  Procedures  . DG Chest 2 View    Standing Status:   Standing    Number of Occurrences:   1    Order Specific Question:   Reason for Exam (SYMPTOM  OR DIAGNOSIS REQUIRED)    Answer:   r/o PNA   No results found for this or any previous visit (from the past 24 hour(s)). No results found.  ED Clinical Impression  Influenza  COPD exacerbation [facility])   ED Assessment/Plan  Previous O2 sats 96, 98%.  Reviewed imaging independently.  No pneumonia.  See radiology report for full details.  presentation consistent with influenza with a mild COPD exacerbation. sending home with Tamiflu, azithromycin,, advised two puffs from his albuterol inhaler using a spacer every 4-6 hours.  No steroids due to  diabetes. Follow up with his PMD in several days, to the ER if he gets worse.    Discussed imaging, MDM, treatment plan, and plan for follow-up with patient Discussed sn/sx that should prompt return to the ED. patient agrees with plan.   Meds ordered this encounter  Medications  . DISCONTD: predniSONE (DELTASONE) 20 MG tablet    Sig: Take 2 tablets (40 mg total) by mouth daily with breakfast for 5 days.    Dispense:  10 tablet    Refill:  0  . oseltamivir (TAMIFLU) 75 MG capsule    Sig: Take 1 capsule (75 mg total) by mouth 2 (two) times daily. X 5 days    Dispense:  10 capsule    Refill:  0  . azithromycin (ZITHROMAX) 250 MG tablet    Sig: Take 1 tablet (250 mg total) by mouth daily. 2 tabs po on day 1, 1 tab po on days 2-5    Dispense:  6 tablet    Refill:  0    *This clinic note was created using [employer]. Therefore, there may be occasional mistakes despite careful proofreading.  ?   Melynda Ripple, MD 07/08/17 1358

## 2017-07-06 NOTE — ED Triage Notes (Signed)
Pt c/o cough, shaking, chills since yesterday afternoon. Took OTC medicine, had some soup, temp was 101, took tylenol.

## 2017-07-06 NOTE — Discharge Instructions (Addendum)
Finish the Tamiflu, azithromycin. 2 puffs from your albuterol inhaler every 4-6 hours using your spacer.  Continue your other cold medications.  I have decided to not give you prednisone because of your history of diabetes, as the prednisone can elevate your blood sugar significantly.  I think that you will be fine with this medication regimen.

## 2017-08-10 ENCOUNTER — Ambulatory Visit: Payer: Medicare Other | Admitting: Allergy and Immunology

## 2017-08-10 DIAGNOSIS — J309 Allergic rhinitis, unspecified: Secondary | ICD-10-CM

## 2017-09-20 ENCOUNTER — Encounter: Payer: Self-pay | Admitting: Allergy and Immunology

## 2017-09-20 ENCOUNTER — Other Ambulatory Visit: Payer: Self-pay

## 2017-09-20 ENCOUNTER — Ambulatory Visit (INDEPENDENT_AMBULATORY_CARE_PROVIDER_SITE_OTHER): Payer: Medicare Other | Admitting: Allergy and Immunology

## 2017-09-20 VITALS — BP 112/65 | HR 76 | Temp 98.1°F | Resp 20 | Ht 65.8 in | Wt 186.4 lb

## 2017-09-20 DIAGNOSIS — J449 Chronic obstructive pulmonary disease, unspecified: Secondary | ICD-10-CM

## 2017-09-20 DIAGNOSIS — J45901 Unspecified asthma with (acute) exacerbation: Secondary | ICD-10-CM

## 2017-09-20 DIAGNOSIS — J3089 Other allergic rhinitis: Secondary | ICD-10-CM

## 2017-09-20 DIAGNOSIS — K219 Gastro-esophageal reflux disease without esophagitis: Secondary | ICD-10-CM

## 2017-09-20 MED ORDER — BUDESONIDE-FORMOTEROL FUMARATE 160-4.5 MCG/ACT IN AERO: 2.0000 | INHALATION_SPRAY | Freq: Two times a day (BID) | RESPIRATORY_TRACT | 5 refills | Status: DC

## 2017-09-20 MED ORDER — FLUTICASONE PROPIONATE 50 MCG/ACT NA SUSP: 2.0000 | Freq: Every day | NASAL | 5 refills | Status: DC

## 2017-09-20 MED ORDER — AZELASTINE HCL 0.1 % NA SOLN: 1.0000 | Freq: Two times a day (BID) | NASAL | 5 refills | Status: DC

## 2017-09-20 MED ORDER — ALBUTEROL SULFATE 108 (90 BASE) MCG/ACT IN AEPB: 2.0000 | INHALATION_SPRAY | RESPIRATORY_TRACT | 1 refills | Status: DC

## 2017-09-20 NOTE — Assessment & Plan Note (Signed)
   Continue appropriate allergen avoidance measures and montelukast 10 mg daily at bedtime..    A refill prescription has been provided for azelastine nasal spray, 1-2 sprays per nostril 2 times daily as needed.   Continue fluticasone nasal spray, 2 sprays per nostril daily.  Nasal saline lavage (NeilMed) has been recommended as needed and prior to medicated nasal sprays along with instructions for proper administration.

## 2017-09-20 NOTE — Assessment & Plan Note (Signed)
   Continue appropriate reflux list on modifications and omeprazole as prescribed.

## 2017-09-20 NOTE — Progress Notes (Signed)
Follow-up Note  RE: Douglas Hansen MRN: [id number] DOB: March 03, 1946 Date of Office Visit: 09/20/2017  Primary care provider: [name], MD Referring provider: [name], MD  History of present illness: Douglas Hansen is a 72 y.o. male with COPD/asthma and allergic rhinitis presenting today for follow-up.  He was last seen in this clinic on April 12, 2017.  He reports that in the interval since his previous visit his COPD/asthma has been relatively well controlled while taking Symbicort 160-4.5 g, 2 inhalations via spacer device twice daily, and Spiriva Respimat 1.25 g, 2 inhalations daily.  While on this regimen, he rarely requires albuterol rescue, denies limitations in normal daily activities, and denies nocturnal awakenings due to lower respiratory symptoms.  He reports that he has been experiencing somewhat frequent nasal congestion, however admits that he has not been using azelastine nasal spray in conjunction with his fluticasone nasal spray as recommended during his previous visit.  He has no reflux related complaints today.  Assessment and plan: Asthma with COPD ([place]) Currently well controlled.  Continue Symbicort 160-4.5 g, 2 inhalations via spacer device twice daily, Spiriva Respimat 1.25 g, 2 inhalations daily, montelukast 10 mg daily bedtime, and albuterol every 6 hours if needed.  Refill prescriptions have been provided.  Subjective and objective measures of pulmonary function will be followed and the treatment plan will be adjusted accordingly.  Other allergic rhinitis  Continue appropriate allergen avoidance measures and montelukast 10 mg daily at bedtime..    A refill prescription has been provided for azelastine nasal spray, 1-2 sprays per nostril 2 times daily as needed.   Continue fluticasone nasal spray, 2 sprays per nostril daily.  Nasal saline lavage (NeilMed) has been recommended as needed and prior to medicated nasal sprays along with instructions for  proper administration.  Gastroesophageal reflux disease without esophagitis  Continue appropriate reflux list on modifications and omeprazole as prescribed.   Meds ordered this encounter  Medications  . Albuterol Sulfate 108 (90 Base) MCG/ACT AEPB    Sig: Inhale 2 puffs into the lungs every 4 (four) hours.    Dispense:  1 each    Refill:  1  . azelastine (ASTELIN) 0.1 % nasal spray    Sig: Place 1-2 sprays into both nostrils 2 (two) times daily. Use in each nostril as directed    Dispense:  30 mL    Refill:  5  . fluticasone (FLONASE) 50 MCG/ACT nasal spray    Sig: Place 2 sprays into both nostrils daily.    Dispense:  16 g    Refill:  5  . budesonide-formoterol (SYMBICORT) 160-4.5 MCG/ACT inhaler    Sig: Inhale 2 puffs into the lungs 2 (two) times daily.    Dispense:  1 Inhaler    Refill:  5    Diagnostics: Spirometry reveals an FVC of 1.95 L and an FEV1 of 1.18 L (53% predicted, with an FEV1 ratio of 80%.  FEV1 is improved compared with previous study.  Please see scanned spirometry results for details.    Physical examination: Blood pressure 112/65, pulse 76, temperature 98.1 F (36.7 C), temperature source Oral, resp. rate 20, height 5' 5.8" (1.671 m), weight 186 lb 6.4 oz (84.6 kg), SpO2 97 %.  General: Alert, interactive, in no acute distress. HEENT: TMs pearly gray, turbinates moderately edematous without discharge, post-pharynx mildly erythematous. Neck: Supple without lymphadenopathy. Lungs: Mildly decreased breath sounds bilaterally without wheezing, rhonchi or rales. CV: Normal S1, S2 without murmurs. Skin: Warm and dry, without  lesions or rashes.  The following portions of the patient's history were reviewed and updated as appropriate: allergies, current medications, past family history, past medical history, past social history, past surgical history and problem list.  Allergies as of 09/20/2017      Reactions   Codeine Other (See Comments)   REACTION: GI  upset   Lipitor [atorvastatin]    Pravachol [pravastatin Sodium]       Medication List        Accurate as of 09/20/17  4:40 PM. Always use your most recent med list.          albuterol 108 (90 Base) MCG/ACT inhaler Commonly known as:  PROAIR HFA Inhale 2 puffs into the lungs every 4 (four) hours as needed for wheezing (or coughing.).   Albuterol Sulfate 108 (90 Base) MCG/ACT Aepb Inhale 2 puffs into the lungs every 4 (four) hours.   ALIGN 4 MG Caps Take 4 mg by mouth daily.   aspirin 325 MG tablet Take 325 mg by mouth daily.   azelastine 0.1 % nasal spray Commonly known as:  ASTELIN Place 1-2 sprays into both nostrils 2 (two) times daily.   azelastine 0.1 % nasal spray Commonly known as:  ASTELIN Place 1-2 sprays into both nostrils 2 (two) times daily. Use in each nostril as directed   AZOR 10-40 MG tablet Generic drug:  amLODipine-olmesartan Take 1 tablet by mouth daily.   BEANO PO Take 1 tablet by mouth 4 (four) times daily as needed (gas, bloating).   budesonide-formoterol 160-4.5 MCG/ACT inhaler Commonly known as:  SYMBICORT Inhale 2 puffs into the lungs 2 (two) times daily.   budesonide-formoterol 160-4.5 MCG/ACT inhaler Commonly known as:  SYMBICORT Inhale 2 puffs into the lungs 2 (two) times daily.   carvedilol 3.125 MG tablet Commonly known as:  COREG Take 3.125 mg by mouth 2 (two) times daily.   Cellulose Carmellose Sodium Powd Apply 1 application topically daily as needed (irritation). Apply to feet   clindamycin-benzoyl peroxide gel Commonly known as:  BENZACLIN Apply 1 application topically daily at 3 pm.   clobetasol cream 0.05 % Commonly known as:  TEMOVATE Apply 1 application topically daily as needed (hands, arms, neck, thighs).   clotrimazole 1 % cream Commonly known as:  LOTRIMIN Apply 1 application topically daily as needed (s). For skin condition   Diclofenac Sodium 1.5 % Soln Apply 12 drops topically 2 (two) times a week. Uses  12-14 drops on scalp as needed for dryness   fluticasone 50 MCG/ACT nasal spray Commonly known as:  FLONASE Place 2 sprays into both nostrils daily.   fluticasone 50 MCG/ACT nasal spray Commonly known as:  FLONASE Place 2 sprays into both nostrils daily.   FLUTTER Devi Use as directed   furosemide 20 MG tablet Commonly known as:  LASIX Take 1 tablet (20 mg total) by mouth daily.   ketoconazole 2 % cream Commonly known as:  NIZORAL Apply 1 application topically daily.   metFORMIN 500 MG tablet Commonly known as:  GLUCOPHAGE Take 1,000 mg by mouth Twice daily.   MULTIVITAMIN & MINERAL PO Take 1 tablet by mouth daily. Reported on 06/17/2015   MYRBETRIQ 25 MG Tb24 tablet Generic drug:  mirabegron ER   omeprazole 20 MG capsule Commonly known as:  PRILOSEC Take 20 mg by mouth daily.   SHINGRIX injection Generic drug:  Zoster Vaccine Adjuvanted inject 0.5 milliliter intramuscularly   simvastatin 20 MG tablet Commonly known as:  ZOCOR Take 20 mg by mouth  every evening.   tiotropium 18 MCG inhalation capsule Commonly known as:  SPIRIVA Place 1 capsule (18 mcg total) into inhaler and inhale daily.   Tiotropium Bromide Monohydrate 1.25 MCG/ACT Aers Commonly known as:  SPIRIVA RESPIMAT Inhale 2 puffs into the lungs daily.   triamcinolone cream 0.1 % Commonly known as:  KENALOG Apply topically 2 (two) times daily. Reported on 06/17/2015   VICTOZA 18 MG/3ML Sopn Generic drug:  liraglutide Inject 0.6 mg into the skin daily.   vitamin C 1000 MG tablet Take 1,000 mg by mouth daily. Reported on 06/17/2015   VITAMIN D-1000 MAX ST 1000 units tablet Generic drug:  Cholecalciferol Take 1,000 Units by mouth daily. Reported on 06/17/2015   ZOLOFT PO Take by mouth.       Allergies  Allergen Reactions  . Codeine Other (See Comments)    REACTION: GI upset  . Lipitor [Atorvastatin]   . Pravachol [Pravastatin Sodium]    Review of systems: Review of systems negative  except as noted in HPI / PMHx or noted below: Constitutional: Negative.  HENT: Negative.   Eyes: Negative.  Respiratory: Negative.   Cardiovascular: Negative.  Gastrointestinal: Negative.  Genitourinary: Negative.  Musculoskeletal: Negative.  Neurological: Negative.  Endo/Heme/Allergies: Negative.  Cutaneous: Negative.  Past Medical History:  Diagnosis Date  . Agent orange exposure   . Arthritis   . Asthma   . COPD (chronic obstructive pulmonary disease) ([place])   . Diabetes mellitus   . Hyperlipemia   . Hypertension   . OSA on CPAP   . Pneumonia   . PTSD (post-traumatic stress disorder)     Family History  Problem Relation Age of Onset  . Emphysema Father   . Asthma Father   . Heart disease Father   . Heart attack Father   . Stroke Mother   . Heart attack Brother   . Diabetes Brother   . Heart attack Brother   . Diabetes Brother   . Colon cancer Neg Hx     Social History   Socioeconomic History  . Marital status: Married    Spouse name: Not on file  . Number of children: 5  . Years of education: Not on file  . Highest education level: Not on file  Occupational History  . Occupation: Retired  Scientific laboratory technician  . Financial resource strain: Not on file  . Food insecurity:    Worry: Not on file    Inability: Not on file  . Transportation needs:    Medical: Not on file    Non-medical: Not on file  Tobacco Use  . Smoking status: Former Smoker    Packs/day: 1.00    Years: 20.00    Pack years: 20.00    Types: Cigarettes    Last attempt to quit: 03/09/1985    Years since quitting: 32.5  . Smokeless tobacco: Never Used  Substance and Sexual Activity  . Alcohol use: No  . Drug use: No  . Sexual activity: Not on file  Lifestyle  . Physical activity:    Days per week: Not on file    Minutes per session: Not on file  . Stress: Not on file  Relationships  . Social connections:    Talks on phone: Not on file    Gets together: Not on file    Attends religious  service: Not on file    Active member of club or organization: Not on file    Attends meetings of clubs or organizations: Not on file  Relationship status: Not on file  . Intimate partner violence:    Fear of current or ex partner: Not on file    Emotionally abused: Not on file    Physically abused: Not on file    Forced sexual activity: Not on file  Other Topics Concern  . Not on file  Social History Narrative   Epworth Sleepiness Scale = 6 (as of 04/17/2015)    I appreciate the opportunity to take part in Douglas Hansen's care. Please do not hesitate to contact me with questions.  Sincerely,   R. Edgar Frisk, MD

## 2017-09-20 NOTE — Assessment & Plan Note (Signed)
Currently well controlled.  Continue Symbicort 160-4.5 g, 2 inhalations via spacer device twice daily, Spiriva Respimat 1.25 g, 2 inhalations daily, montelukast 10 mg daily bedtime, and albuterol every 6 hours if needed.  Refill prescriptions have been provided.  Subjective and objective measures of pulmonary function will be followed and the treatment plan will be adjusted accordingly.

## 2017-09-20 NOTE — Patient Instructions (Signed)
Asthma with COPD ([place]) Currently well controlled.  Continue Symbicort 160-4.5 g, 2 inhalations via spacer device twice daily, Spiriva Respimat 1.25 g, 2 inhalations daily, montelukast 10 mg daily bedtime, and albuterol every 6 hours if needed.  Refill prescriptions have been provided.  Subjective and objective measures of pulmonary function will be followed and the treatment plan will be adjusted accordingly.  Other allergic rhinitis  Continue appropriate allergen avoidance measures and montelukast 10 mg daily at bedtime..    A refill prescription has been provided for azelastine nasal spray, 1-2 sprays per nostril 2 times daily as needed.   Continue fluticasone nasal spray, 2 sprays per nostril daily.  Nasal saline lavage (NeilMed) has been recommended as needed and prior to medicated nasal sprays along with instructions for proper administration.  Gastroesophageal reflux disease without esophagitis  Continue appropriate reflux list on modifications and omeprazole as prescribed.   Return in about 4 months (around 01/21/2018), or if symptoms worsen or fail to improve.

## 2017-10-07 DIAGNOSIS — M545 Low back pain: Secondary | ICD-10-CM | POA: Diagnosis not present

## 2017-10-07 DIAGNOSIS — E118 Type 2 diabetes mellitus with unspecified complications: Secondary | ICD-10-CM | POA: Diagnosis not present

## 2017-10-07 DIAGNOSIS — Z125 Encounter for screening for malignant neoplasm of prostate: Secondary | ICD-10-CM | POA: Diagnosis not present

## 2017-10-07 DIAGNOSIS — Z0000 Encounter for general adult medical examination without abnormal findings: Secondary | ICD-10-CM | POA: Diagnosis not present

## 2017-10-07 DIAGNOSIS — E291 Testicular hypofunction: Secondary | ICD-10-CM | POA: Diagnosis not present

## 2017-10-07 DIAGNOSIS — E7800 Pure hypercholesterolemia, unspecified: Secondary | ICD-10-CM | POA: Diagnosis not present

## 2017-10-07 DIAGNOSIS — I10 Essential (primary) hypertension: Secondary | ICD-10-CM | POA: Diagnosis not present

## 2017-10-12 ENCOUNTER — Other Ambulatory Visit: Payer: Self-pay

## 2017-10-12 ENCOUNTER — Ambulatory Visit (INDEPENDENT_AMBULATORY_CARE_PROVIDER_SITE_OTHER): Payer: Medicare Other

## 2017-10-12 ENCOUNTER — Ambulatory Visit (HOSPITAL_COMMUNITY)
Admission: EM | Admit: 2017-10-12 | Discharge: 2017-10-12 | Disposition: A | Discharge status: Home / Self Care | Payer: Medicare Other | Attending: Family Medicine | Admitting: Family Medicine

## 2017-10-12 ENCOUNTER — Encounter (HOSPITAL_COMMUNITY): Payer: Self-pay

## 2017-10-12 DIAGNOSIS — M47812 Spondylosis without myelopathy or radiculopathy, cervical region: Secondary | ICD-10-CM | POA: Diagnosis not present

## 2017-10-12 DIAGNOSIS — S199XXA Unspecified injury of neck, initial encounter: Secondary | ICD-10-CM | POA: Diagnosis not present

## 2017-10-12 DIAGNOSIS — M542 Cervicalgia: Secondary | ICD-10-CM | POA: Diagnosis not present

## 2017-10-12 MED ORDER — PREDNISONE 10 MG (21) PO TBPK: ORAL_TABLET | Freq: Every day | ORAL | 0 refills | Status: DC

## 2017-10-12 MED ORDER — KETOROLAC TROMETHAMINE 30 MG/ML IJ SOLN
INTRAMUSCULAR | Status: AC
  Filled 2017-10-12: qty 1

## 2017-10-12 MED ORDER — METHYLPREDNISOLONE SODIUM SUCC 125 MG IJ SOLR
INTRAMUSCULAR | Status: AC
  Filled 2017-10-12: qty 2

## 2017-10-12 MED ORDER — METHYLPREDNISOLONE SODIUM SUCC 125 MG IJ SOLR
80.0000 mg | Freq: Once | INTRAMUSCULAR | Status: AC
  Administered 2017-10-12: 80 mg via INTRAMUSCULAR

## 2017-10-12 MED ORDER — KETOROLAC TROMETHAMINE 30 MG/ML IJ SOLN
30.0000 mg | Freq: Once | INTRAMUSCULAR | Status: AC
  Administered 2017-10-12: 30 mg via INTRAMUSCULAR

## 2017-10-12 MED ORDER — METHOCARBAMOL 500 MG PO TABS: 500.0000 mg | ORAL_TABLET | Freq: Two times a day (BID) | ORAL | 0 refills | Status: DC

## 2017-10-12 NOTE — ED Provider Notes (Signed)
Douglas Hansen    CSN: [id number] Arrival date & time: 10/12/17  2878     History   Chief Complaint Chief Complaint  Patient presents with  . Neck Pain    HPI Douglas Hansen is a 72 y.o. male.   HPI  This is a very pleasant 72 year old gentleman who is here for neck pain.  He states that he knows he has some arthritis in his neck and back.  He periodically gets a stiff neck.  He states that he will baby it for a day or 2 and then it goes away.  He has never had a significant neck injury.  He states that 3 he woke up with a stiff neck.  He states is not getting better.  He states he can hardly move his days ago head.  In addition, he states it is making his legs feel weak.  He states he did has had 2 falls over the weekend.  No numbness of the legs.  He states that weakness in his legs is a general weak feeling, right greater than left.  He can walk without limp.  No bowel or bladder complaint.  Past Medical History:  Diagnosis Date  . Agent orange exposure   . Arthritis   . Asthma   . COPD (chronic obstructive pulmonary disease) ([place])   . Diabetes mellitus   . Hyperlipemia   . Hypertension   . OSA on CPAP   . Pneumonia   . PTSD (post-traumatic stress disorder)     Patient Active Problem List   Diagnosis Date Noted  . Dyslipidemia 06/03/2016  . Other fatigue 06/03/2016  . Claudication ([place]) 06/03/2016  . Acute sinusitis 12/16/2015  . Asthma with acute exacerbation 06/17/2015  . Acute bronchitis 06/17/2015  . Gastroesophageal reflux disease without esophagitis 06/17/2015  . Shortness of breath 04/17/2015  . Acute pulmonary edema ([place]) 04/17/2015  . Chronic obstructive pulmonary disease, unspecified copd, unspecified chronic bronchitis type 01/07/2015  . Moderate persistent asthma 01/07/2015  . Other allergic rhinitis 01/07/2015  . Obstructive sleep apnea treated with continuous positive airway pressure (CPAP) 01/07/2015  . Asthma with COPD ([place])     Class:  Chronic  . Pneumonia due to other gram-negative bacteria ([place]) 01/30/2011  . Cough 06/09/2010  . COPD (chronic obstructive pulmonary disease) ([place]) 06/09/2010  . OSA on CPAP   . Diabetes mellitus   . Hyperlipemia   . Essential hypertension     Past Surgical History:  Procedure Laterality Date  . CARDIAC CATHETERIZATION    . right thumb surgery    . TONSILLECTOMY         Home Medications    Prior to Admission medications   Medication Sig Start Date End Date Taking? Authorizing Provider  albuterol (PROAIR HFA) 108 (90 Base) MCG/ACT inhaler Inhale 2 puffs into the lungs every 4 (four) hours as needed for wheezing (or coughing.). 06/17/15   Charlies Silvers, MD  Albuterol Sulfate 108 (90 Base) MCG/ACT AEPB Inhale 2 puffs into the lungs every 4 (four) hours. 09/20/17   Bobbitt, Sedalia Muta, MD  Alpha-D-Galactosidase (BEANO PO) Take 1 tablet by mouth 4 (four) times daily as needed (gas, bloating).     [provider]  amLODipine-olmesartan (AZOR) 10-40 MG per tablet Take 1 tablet by mouth daily.      [provider]  Ascorbic Acid (VITAMIN C) 1000 MG tablet Take 1,000 mg by mouth daily. Reported on 06/17/2015    [provider]  aspirin 325  MG tablet Take 325 mg by mouth daily.      [provider]  azelastine (ASTELIN) 0.1 % nasal spray Place 1-2 sprays into both nostrils 2 (two) times daily. 04/12/17   Bobbitt, Sedalia Muta, MD  azelastine (ASTELIN) 0.1 % nasal spray Place 1-2 sprays into both nostrils 2 (two) times daily. Use in each nostril as directed 09/20/17   Bobbitt, Sedalia Muta, MD  budesonide-formoterol [facility]) 160-4.5 MCG/ACT inhaler Inhale 2 puffs into the lungs 2 (two) times daily. 11/30/16   Bobbitt, Sedalia Muta, MD  budesonide-formoterol (SYMBICORT) 160-4.5 MCG/ACT inhaler Inhale 2 puffs into the lungs 2 (two) times daily. 09/20/17   Bobbitt, Sedalia Muta, MD  carvedilol (COREG) 3.125 MG tablet Take 3.125 mg by mouth 2 (two) times daily.   11/29/14   [provider]  Cellulose Carmellose Sodium POWD Apply 1 application topically daily as needed (irritation). Apply to feet    [provider]  Cholecalciferol (VITAMIN D-1000 MAX ST) 1000 UNITS tablet Take 1,000 Units by mouth daily. Reported on 06/17/2015    [provider]  clindamycin-benzoyl peroxide (BENZACLIN) gel Apply 1 application topically daily at 3 pm.     [provider]  clobetasol (TEMOVATE) 0.05 % cream Apply 1 application topically daily as needed (hands, arms, neck, thighs).     [provider]  clotrimazole (LOTRIMIN) 1 % cream Apply 1 application topically daily as needed (s). For skin condition    [provider]  Diclofenac Sodium 1.5 % SOLN Apply 12 drops topically 2 (two) times a week. Uses 12-14 drops on scalp as needed for dryness 10/17/14   [provider]  fluticasone (FLONASE) 50 MCG/ACT nasal spray Place 2 sprays into both nostrils daily. 11/30/16   Bobbitt, Sedalia Muta, MD  fluticasone (FLONASE) 50 MCG/ACT nasal spray Place 2 sprays into both nostrils daily. 09/20/17   Bobbitt, Sedalia Muta, MD  furosemide (LASIX) 20 MG tablet Take 1 tablet (20 mg total) by mouth daily. 05/09/15   Hilty, Nadean Corwin, MD  ketoconazole (NIZORAL) 2 % cream Apply 1 application topically daily.     [provider]  metFORMIN (GLUCOPHAGE) 500 MG tablet Take 1,000 mg by mouth Twice daily.  07/28/10   [provider]  methocarbamol (ROBAXIN) 500 MG tablet Take 1 tablet (500 mg total) by mouth 2 (two) times daily. 10/12/17   Raylene Everts, MD  Multiple Vitamins-Minerals (MULTIVITAMIN & MINERAL PO) Take 1 tablet by mouth daily. Reported on 06/17/2015    [provider]  MYRBETRIQ 25 MG TB24 tablet  04/07/17   [provider]  omeprazole (PRILOSEC) 20 MG capsule Take 20 mg by mouth daily.    [provider]  predniSONE (STERAPRED UNI-PAK 21 TAB) 10 MG (21) TBPK tablet Take by mouth  daily. tad 10/12/17   Raylene Everts, MD  Probiotic Product (ALIGN) 4 MG CAPS Take 4 mg by mouth daily.     [provider]  Respiratory Therapy Supplies (FLUTTER) DEVI Use as directed 04/20/16   Bobbitt, Sedalia Muta, MD  Sertraline HCl (ZOLOFT PO) Take by mouth.    [provider]  [facility] injection inject 0.5 milliliter intramuscularly 09/21/16   [provider]  simvastatin (ZOCOR) 20 MG tablet Take 20 mg by mouth every evening.    [provider]  tiotropium (SPIRIVA) 18 MCG inhalation capsule Place 1 capsule (18 mcg total) into inhaler and inhale daily. 04/20/16   Bobbitt, Sedalia Muta, MD  Tiotropium Bromide Monohydrate (SPIRIVA RESPIMAT) 1.25  MCG/ACT AERS Inhale 2 puffs into the lungs daily. 04/12/17   Bobbitt, Sedalia Muta, MD  triamcinolone (KENALOG) 0.1 % cream Apply topically 2 (two) times daily. Reported on 06/17/2015    [provider]  VICTOZA 18 MG/3ML SOPN Inject 0.6 mg into the skin daily. 12/24/14   [provider]    Family History Family History  Problem Relation Age of Onset  . Emphysema Father   . Asthma Father   . Heart disease Father   . Heart attack Father   . Stroke Mother   . Heart attack Brother   . Diabetes Brother   . Heart attack Brother   . Diabetes Brother   . Colon cancer Neg Hx     Social History Social History   Tobacco Use  . Smoking status: Former Smoker    Packs/day: 1.00    Years: 20.00    Pack years: 20.00    Types: Cigarettes    Last attempt to quit: 03/09/1985    Years since quitting: 32.6  . Smokeless tobacco: Never Used  Substance Use Topics  . Alcohol use: No  . Drug use: No     Allergies   Codeine; Lipitor [atorvastatin]; and Pravachol [pravastatin sodium]   Review of Systems Review of Systems  Constitutional: Negative for chills and fever.  HENT: Negative for ear pain and sore throat.   Eyes: Negative for pain and visual disturbance.  Respiratory: Negative for cough  and shortness of breath.   Cardiovascular: Negative for chest pain and palpitations.  Gastrointestinal: Negative for abdominal pain and vomiting.  Genitourinary: Negative for dysuria and hematuria.  Musculoskeletal: Positive for neck pain and neck stiffness. Negative for arthralgias and back pain.  Skin: Negative for color change and rash.  Neurological: Positive for weakness. Negative for seizures and syncope.  All other systems reviewed and are negative.    Physical Exam Triage Vital Signs ED Triage Vitals  Enc Vitals Group     BP 10/12/17 0917 (!) 166/84     Pulse Rate 10/12/17 0917 81     Resp 10/12/17 0917 16     Temp 10/12/17 0917 97.9 F (36.6 C)     Temp Source 10/12/17 0917 Oral     SpO2 10/12/17 0917 99 %     Weight 10/12/17 0928 182 lb (82.6 kg)     Height --      Head Circumference --      Peak Flow --      Pain Score 10/12/17 0927 7     Pain Loc --      Pain Edu? --      Excl. in [place]? --    No data found.  Updated Vital Signs BP (!) 166/84 (BP Location: Left Arm)   Pulse 81   Temp 97.9 F (36.6 C) (Oral)   Resp 16   Wt 182 lb (82.6 kg)   SpO2 99%   BMI 29.55 kg/m       Physical Exam  Constitutional: He appears well-developed and well-nourished. No distress.  Obvious discomfort, stiff posture  HENT:  Head: Normocephalic and atraumatic.  Mouth/Throat: Oropharynx is clear and moist.  Eyes: Pupils are equal, round, and reactive to light. Conjunctivae are normal.  Neck:  Patient holds head in a forward position with his chin nearly on his chest.  When asked to do range of motion of his neck, he will lift his chin no more than 2 cm from the chest.  He will move laterally  no more than 15 degrees.  Cardiovascular: Normal rate.  Pulmonary/Chest: Effort normal. No respiratory distress.  Abdominal: Soft. He exhibits no distension.  Musculoskeletal: Normal range of motion. He exhibits no edema.  Strength sensation range of motion's and reflexes are intact in  all 4 extremities.  No clonus.  Negative Romberg.  Neurological: He is alert.  Skin: Skin is warm and dry.  Psychiatric: He has a normal mood and affect. His behavior is normal.     UC Treatments / Results  Labs (all labs ordered are listed, but only abnormal results are displayed) Labs Reviewed - No data to display  EKG None  Radiology Dg Cervical Spine Complete  Result Date: 10/12/2017 CLINICAL DATA:  severe pain after fall. EXAM: CERVICAL SPINE - COMPLETE 4+ VIEW COMPARISON:  None. FINDINGS: Reversal of normal cervical lordosis. No acute loss vertebral body height or disc height. There is endplate spurring from W0-J8. Oblique projections demonstrate no trauma. The neural foramina are poorly imaged. Open mouth odontoid view is obscured by teeth and bone. IMPRESSION: 1. No evidence cervical spine fracture on limited exam. 2. Reversal of the normal cervical lordosis. Electronically Signed   By: Suzy Bouchard M.D.   On: 10/12/2017 10:25    Procedures Procedures (including critical care time)  Medications Ordered in UC Medications  ketorolac (TORADOL) 30 MG/ML injection 30 mg (30 mg Intramuscular Given 10/12/17 1010)  methylPREDNISolone sodium succinate (SOLU-MEDROL) 125 mg/2 mL injection 80 mg (80 mg Intramuscular Given 10/12/17 1011)    Initial Impression / Assessment and Plan / UC Course  I have reviewed the triage vital signs and the nursing notes.  Pertinent labs & imaging results that were available during my care of the patient were reviewed by me and considered in my medical decision making (see chart for details).     Discussed with patient I do have concern regarding the weak sensation in his leg.  I told him this could be a pinched nerve in his neck, and if it worsens he needs to be seen right away.  I explained the limitations of a standard neck x-ray, not showing pinched nerves of spinal cord impingement.  After injection of steroid and Toradol patient does feel better.   As I enter the room he has lifted his chin to a neutral position and can do improved range of motion.  Walks with a normal gait.  He is cautioned to go to the ER immediately if he has worsening numbness, weakness, or pain in his lower extremities Final Clinical Impressions(s) / UC Diagnoses   Final diagnoses:  Acute neck pain  Osteoarthritis of cervical spine, unspecified spinal osteoarthritis complication status     Discharge Instructions     Rest Ice or heat to back Take the prednisone as directed Take the muscle relaxer as needed See your PCP if you fail to improve If you become acutely worse or have increased weakness, go to ER   ED Prescriptions    Medication Sig Dispense Auth. Provider   predniSONE (STERAPRED UNI-PAK 21 TAB) 10 MG (21) TBPK tablet Take by mouth daily. tad 21 tablet Raylene Everts, MD   methocarbamol (ROBAXIN) 500 MG tablet Take 1 tablet (500 mg total) by mouth 2 (two) times daily. 20 tablet Raylene Everts, MD     Controlled Substance Prescriptions [place] Controlled Substance Registry consulted? Not Applicable   Raylene Everts, MD 10/12/17 1246

## 2017-10-12 NOTE — Discharge Instructions (Signed)
Rest Ice or heat to back Take the prednisone as directed Take the muscle relaxer as needed See your PCP if you fail to improve If you become acutely worse or have increased weakness, go to ER

## 2017-10-12 NOTE — ED Notes (Signed)
Patient transported to X-ray 

## 2017-10-12 NOTE — ED Triage Notes (Signed)
This started last Friday.,. Neck pain and leg weakness.

## 2017-10-19 DIAGNOSIS — M436 Torticollis: Secondary | ICD-10-CM | POA: Diagnosis not present

## 2017-10-19 DIAGNOSIS — M542 Cervicalgia: Secondary | ICD-10-CM | POA: Diagnosis not present

## 2017-12-09 ENCOUNTER — Encounter: Payer: Self-pay | Admitting: Internal Medicine

## 2017-12-09 ENCOUNTER — Ambulatory Visit (INDEPENDENT_AMBULATORY_CARE_PROVIDER_SITE_OTHER): Payer: Medicare Other | Admitting: Internal Medicine

## 2017-12-09 VITALS — BP 128/84 | HR 71 | Ht 65.0 in | Wt 182.8 lb

## 2017-12-09 DIAGNOSIS — J449 Chronic obstructive pulmonary disease, unspecified: Secondary | ICD-10-CM

## 2017-12-09 DIAGNOSIS — I10 Essential (primary) hypertension: Secondary | ICD-10-CM | POA: Diagnosis not present

## 2017-12-09 DIAGNOSIS — R61 Generalized hyperhidrosis: Secondary | ICD-10-CM | POA: Diagnosis not present

## 2017-12-09 DIAGNOSIS — E785 Hyperlipidemia, unspecified: Secondary | ICD-10-CM | POA: Diagnosis not present

## 2017-12-09 NOTE — Progress Notes (Signed)
OFFICE NOTE  Chief Complaint:  Routine follow-up  Primary Care Physician: [name], MD  HPI:  Douglas Hansen is a pleasant 72 year old male who is currently referred to me by Dr. Maudie Mercury. His past Douglas Hansen history is extensive and includes the following: Hypertension, dyslipidemia, type 2 diabetes, COPD, obstructive sleep apnea on CPAP, GERD, mild PAD, and significant allergic symptoms. He was previously followed by Dr. Einar Gip and has had 2 cardiac catheterizations in his past. His last heart catheterization was in 2004 which showed no significant coronary disease. He had a stress test in February 2014 which was negative for ischemia and EF of 68%. He also had ABIs for leg pain which were 0.9 on the right and 0.94 on the left. He was started on Pletal but had some shortness of breath and abnormal symptoms on it and this was discontinued. His last echocardiogram was in March 2014 showed an EF of 52% with mild to moderate right atrial enlargement, dilated IVC and elevated right atrial pressure. Recently had an upper respiratory infection concerning for COPD exacerbation. He was started on Avelox which she completed. At the end of treatment he felt some improvement in his breathing but then started to have shortness of breath again and cough. He was switched to azithromycin and placed on a prednisone Dosepak. He was also started on low-dose Lasix for chest x-ray findings of some fluid in the right fissure. Douglas Hansen says that he gained a few pounds in weight with this shortness of breath and noted that he's been on Lasix now for 10 days and that he's lost some weight and feels less fullness in his belly and in his chest. He denied any ankle edema. His shortness of breath is improving. He also denies any chest pain or anginal symptoms. He had a mild amount of orthopnea which is also resolving.   Douglas Hansen returns today for follow-up of his echocardiogram. This showed normal LV function with mild pulmonary  hypertension and elevated RVSP of 35 mmHg. This is likely secondary to COPD, but does not explain his shortness of breath. He is currently on Lasix 20 mg daily has not noted a significant improvement in his shortness of breath.  06/02/2016  Douglas Hansen was seen today in follow-up. Overall he seems to be doing well. Blood pressure initially was elevated 162/84. Weight is down from 196-185 pounds. He denies any chest pain. He is short of breath with exertion however it is difficult to tell whether this is related to COPD or not. He has reported some pain in both legs when walking. He says it seems to come on after walking certain distances and gets better at rest, reminiscent of possible claudication.  09/14/2016  Douglas Hansen returns today for follow-up. He still has concerns about bilateral leg pain with walking. He underwent ABIs and lower extremity arterial Dopplers which were normal. He also underwent a nuclear stress test because worsening shortness of breath and was found to have no evidence of ischemia with normal LV function. He's required increasing use of his inhalers recently and I suspect his symptoms may be related to COPD.  12/09/2017  Douglas Hansen is seen today for routine follow-up.  Overall he seems to be doing well.  He says his shortness of breath is been well controlled on inhalers.  Blood pressure is at goal today 128/84.  EKG shows sinus rhythm at 71 with stable inferior and anterior Q waves.  He denies any new chest pain or new  significant cardiac symptoms.  His only other major concern today is over the past 1 to 2 months he has had significant, drenching night sweats.  There is been no associated weight loss or change in appetite, but he is somewhat disturbed by this.  Labs from January 2019 showed total cholesterol 107, HDL 44, LDL 42 and triglycerides 107.  Hemoglobin A1c was 6.2.  PMHx:  Past Medical History:  Diagnosis Date  . Agent orange exposure   . Arthritis   . Asthma   .  COPD (chronic obstructive pulmonary disease) ([place])   . Diabetes mellitus   . Hyperlipemia   . Hypertension   . OSA on CPAP   . Pneumonia   . PTSD (post-traumatic stress disorder)     Past Surgical History:  Procedure Laterality Date  . CARDIAC CATHETERIZATION    . right thumb surgery    . TONSILLECTOMY      FAMHx:  Family History  Problem Relation Age of Onset  . Emphysema Father   . Asthma Father   . Heart disease Father   . Heart attack Father   . Stroke Mother   . Heart attack Brother   . Diabetes Brother   . Heart attack Brother   . Diabetes Brother   . Colon cancer Neg Hx     SOCHx:   reports that he quit smoking about 32 years ago. His smoking use included cigarettes. He has a 20.00 pack-year smoking history. He has never used smokeless tobacco. He reports that he does not drink alcohol or use drugs.  ALLERGIES:  Allergies  Allergen Reactions  . Codeine Other (See Comments)    REACTION: GI upset  . Lipitor [Atorvastatin]   . Pravachol [Pravastatin Sodium]     ROS: Pertinent items noted in HPI and remainder of comprehensive ROS otherwise negative.  HOME MEDS: Current Outpatient Medications  Medication Sig Dispense Refill  . albuterol (PROAIR HFA) 108 (90 Base) MCG/ACT inhaler Inhale 2 puffs into the lungs every 4 (four) hours as needed for wheezing (or coughing.). 1 Inhaler 1  . Albuterol Sulfate 108 (90 Base) MCG/ACT AEPB Inhale 2 puffs into the lungs every 4 (four) hours. 1 each 1  . Alpha-D-Galactosidase (BEANO PO) Take 1 tablet by mouth 4 (four) times daily as needed (gas, bloating).     Marland Kitchen amLODipine-olmesartan (AZOR) 10-40 MG per tablet Take 1 tablet by mouth daily.      . Ascorbic Acid (VITAMIN C) 1000 MG tablet Take 1,000 mg by mouth daily. Reported on 06/17/2015    . aspirin 325 MG tablet Take 325 mg by mouth daily.      Marland Kitchen azelastine (ASTELIN) 0.1 % nasal spray Place 1-2 sprays into both nostrils 2 (two) times daily. 30 mL 5  . budesonide-formoterol  (SYMBICORT) 160-4.5 MCG/ACT inhaler Inhale 2 puffs into the lungs 2 (two) times daily. 1 Inhaler 5  . carvedilol (COREG) 3.125 MG tablet Take 3.125 mg by mouth 2 (two) times daily.   1  . Cellulose Carmellose Sodium POWD Apply 1 application topically daily as needed (irritation). Apply to feet    . Cholecalciferol (VITAMIN D-1000 MAX ST) 1000 UNITS tablet Take 1,000 Units by mouth daily. Reported on 06/17/2015    . clindamycin-benzoyl peroxide (BENZACLIN) gel Apply 1 application topically daily at 3 pm.     . clobetasol (TEMOVATE) 0.05 % cream Apply 1 application topically daily as needed (hands, arms, neck, thighs).     . clotrimazole (LOTRIMIN) 1 % cream Apply 1  application topically daily as needed (s). For skin condition    . Diclofenac Sodium 1.5 % SOLN Apply 12 drops topically 2 (two) times a week. Uses 12-14 drops on scalp as needed for dryness  0  . fluticasone (FLONASE) 50 MCG/ACT nasal spray Place 2 sprays into both nostrils daily. 16 g 5  . furosemide (LASIX) 20 MG tablet Take 1 tablet (20 mg total) by mouth daily. 90 tablet 3  . ketoconazole (NIZORAL) 2 % cream Apply 1 application topically daily.     . metFORMIN (GLUCOPHAGE) 500 MG tablet Take 1,000 mg by mouth Twice daily.     . methocarbamol (ROBAXIN) 500 MG tablet Take 1 tablet (500 mg total) by mouth 2 (two) times daily. 20 tablet 0  . Multiple Vitamins-Minerals (MULTIVITAMIN & MINERAL PO) Take 1 tablet by mouth daily. Reported on 06/17/2015    . MYRBETRIQ 25 MG TB24 tablet   0  . omeprazole (PRILOSEC) 20 MG capsule Take 20 mg by mouth daily.    . Probiotic Product (ALIGN) 4 MG CAPS Take 4 mg by mouth daily.     Marland Kitchen Respiratory Therapy Supplies (FLUTTER) DEVI Use as directed 1 each 0  . Sertraline HCl (ZOLOFT PO) Take by mouth.    Marland Kitchen SHINGRIX injection inject 0.5 milliliter intramuscularly  0  . simvastatin (ZOCOR) 20 MG tablet Take 20 mg by mouth every evening.    . tiotropium (SPIRIVA) 18 MCG inhalation capsule Place 1 capsule (18  mcg total) into inhaler and inhale daily. 30 capsule 3  . Tiotropium Bromide Monohydrate (SPIRIVA RESPIMAT) 1.25 MCG/ACT AERS Inhale 2 puffs into the lungs daily. 4 g 5  . triamcinolone (KENALOG) 0.1 % cream Apply topically 2 (two) times daily. Reported on 06/17/2015    . VICTOZA 18 MG/3ML SOPN Inject 0.6 mg into the skin daily.  0   No current facility-administered medications for this visit.     LABS/IMAGING: No results found for this or any previous visit (from the past 48 hour(s)). No results found.  WEIGHTS: Wt Readings from Last 3 Encounters:  12/09/17 182 lb 12.8 oz (82.9 kg)  10/12/17 182 lb (82.6 kg)  09/20/17 186 lb 6.4 oz (84.6 kg)    VITALS: BP 128/84   Pulse 71   Ht 5\' 5"  (1.651 m)   Wt 182 lb 12.8 oz (82.9 kg)   BMI 30.42 kg/m   EXAM: General appearance: alert and no distress Neck: no carotid bruit, no JVD and thyroid not enlarged, symmetric, no tenderness/mass/nodules Lungs: clear to auscultation bilaterally Heart: regular rate and rhythm, S1, S2 normal, no murmur, click, rub or gallop Abdomen: soft, non-tender; bowel sounds normal; no masses,  no organomegaly Extremities: extremities normal, atraumatic, no cyanosis or edema Pulses: 2+ and symmetric Skin: Skin color, texture, turgor normal. No rashes or lesions Neurologic: Grossly normal Psych: Pleasant  EKG: Normal sinus rhythm 71, inferior and anterior Q waves-personally reviewed  ASSESSMENT: 1. Dyspnea on exertion - normal LVEF 65-70%, DD, mild pulm HTN, low risk Myoview-negative for ischemia (06/2016) 2. Leg pain concerning for claudication- history of mildly reduced ABIs 3. COPD with acute exacerbation 4. Hypertension 5. Dyslipidemia 6. OSA on CPAP 7. Type 2 diabetes  PLAN: 1.   Douglas Hansen denies any worsening shortness of breath or chest pain.  He recently has been having night sweats without associated weight loss or changes in appetite however these constitutional symptoms are concerning for  possible infection or malignancy.  He does have follow-up with his PCP later  this month and hopefully he can look into that further.  The pressure is well controlled and cholesterol has been at goal.  He is compliant with CPAP.  Overall I think he is doing well except for these concerning night sweats.  Plan follow-up with me annually or sooner as necessary.  Douglas Casino, MD, [facility], [place] Director of the [facility]  Card[facility] Diplomate of the American Board of Clinical Lipidology Attending Cardiologist  Direct Dial: [phone]  Fax: [phone]  Website:  [email or web address] 12/09/2017, 11:45 AM

## 2017-12-09 NOTE — Patient Instructions (Addendum)
STOP aspirin 325mg   START aspirin 81mg  (baby aspirin) daily  Your physician wants you to follow-up in: ONE YEAR with Dr. Debara Pickett. You will receive a reminder letter in the mail two months in advance. If you don't receive a letter, please call our office to schedule the follow-up appointment.

## 2018-01-24 ENCOUNTER — Encounter: Payer: Self-pay | Admitting: Allergy and Immunology

## 2018-01-24 ENCOUNTER — Ambulatory Visit (INDEPENDENT_AMBULATORY_CARE_PROVIDER_SITE_OTHER): Payer: Medicare Other | Admitting: Allergy and Immunology

## 2018-01-24 VITALS — BP 118/70 | HR 73 | Resp 18

## 2018-01-24 DIAGNOSIS — J3089 Other allergic rhinitis: Secondary | ICD-10-CM | POA: Diagnosis not present

## 2018-01-24 DIAGNOSIS — J449 Chronic obstructive pulmonary disease, unspecified: Secondary | ICD-10-CM | POA: Diagnosis not present

## 2018-01-24 DIAGNOSIS — J45901 Unspecified asthma with (acute) exacerbation: Secondary | ICD-10-CM | POA: Diagnosis not present

## 2018-01-24 DIAGNOSIS — K219 Gastro-esophageal reflux disease without esophagitis: Secondary | ICD-10-CM

## 2018-01-24 MED ORDER — ALBUTEROL SULFATE 108 (90 BASE) MCG/ACT IN AEPB: 2.0000 | INHALATION_SPRAY | RESPIRATORY_TRACT | 1 refills | Status: DC

## 2018-01-24 MED ORDER — AZELASTINE HCL 0.1 % NA SOLN: 1.0000 | Freq: Two times a day (BID) | NASAL | 5 refills | Status: DC

## 2018-01-24 MED ORDER — BUDESONIDE-FORMOTEROL FUMARATE 160-4.5 MCG/ACT IN AERO: 2.0000 | INHALATION_SPRAY | Freq: Two times a day (BID) | RESPIRATORY_TRACT | 5 refills | Status: DC

## 2018-01-24 MED ORDER — TIOTROPIUM BROMIDE MONOHYDRATE 1.25 MCG/ACT IN AERS: 2.0000 | INHALATION_SPRAY | Freq: Every day | RESPIRATORY_TRACT | 5 refills | Status: DC

## 2018-01-24 NOTE — Progress Notes (Signed)
Follow-up Note  RE: Douglas Hansen MRN: [id number] DOB: October 04, 1945 Date of Office Visit: 01/24/2018  Primary care provider: [name], MD Referring provider: [name], MD  History of present illness: Douglas Hansen is a 72 y.o. male with COPD/asthma, allergic rhinitis, and gastroesophageal reflux presenting today for follow-up.  He was last seen in this clinic on September 20, 2017.  He is currently taking Symbicort 160-4.5 g, 2 elations via spacer device twice daily, Spiriva Respimat 1.25 g, 2 inhalations daily, 2 inhalations daily, and montelukast 10 mg daily.  While on this regimen, his COPD/asthma has been relatively well controlled.  He does not experience nocturnal awakenings due to lower respiratory symptoms and rarely requires albuterol rescue and however, he does note that he typically feels some degree of lower respiratory symptoms with one flight of stairs.  On occasion, he has to stop and rest prior to completing the entire flight of stairs.  He has no nasal allergy symptom complaints today.  His acid reflux is currently well controlled with omeprazole daily.  Assessment and plan: COPD/asthma  Continue Symbicort 160-4.5 g, 2 inhalations via spacer device twice daily, Spiriva Respimat 1.25 g, 2 inhalations daily, montelukast 10 mg daily bedtime, and albuterol every 4-6 hours if needed and 15 minutes prior to exercise.  Refill prescriptions have been provided.  Subjective and objective measures of pulmonary function will be followed and the treatment plan will be adjusted accordingly.  Other allergic rhinitis  Continue appropriate allergen avoidance measures, montelukast 10 mg daily at bedtime, azelastine nasal spray as needed, and fluticasone nasal spray, 2 sprays per nostril daily.  Nasal saline lavage (NeilMed) has been recommended as needed and prior to medicated nasal sprays along with instructions for proper administration.  Gastroesophageal reflux disease without  esophagitis Stable.  Continue appropriate reflux list on modifications and omeprazole as prescribed.   Meds ordered this encounter  Medications  . budesonide-formoterol (SYMBICORT) 160-4.5 MCG/ACT inhaler    Sig: Inhale 2 puffs into the lungs 2 (two) times daily.    Dispense:  1 Inhaler    Refill:  5  . Tiotropium Bromide Monohydrate (SPIRIVA RESPIMAT) 1.25 MCG/ACT AERS    Sig: Inhale 2 puffs into the lungs daily.    Dispense:  4 g    Refill:  5  . Albuterol Sulfate 108 (90 Base) MCG/ACT AEPB    Sig: Inhale 2 puffs into the lungs every 4 (four) hours.    Dispense:  1 each    Refill:  1  . azelastine (ASTELIN) 0.1 % nasal spray    Sig: Place 1-2 sprays into both nostrils 2 (two) times daily.    Dispense:  30 mL    Refill:  5    Diagnostics: Spirometry reveals an FVC of 1.63 L and an FEV1 of 1.07 L (48% predicted) with an FEV1 ratio of 89%.  There was 120 mL postbronchodilator improvement.  Please see scanned spirometry results for details.    Physical examination: Blood pressure 118/70, pulse 73, resp. rate 18, SpO2 96 %.  General: Alert, interactive, in no acute distress. HEENT: TMs pearly gray, turbinates mildly edematous without discharge, post-pharynx mildly erythematous. Neck: Supple without lymphadenopathy. Lungs: Mildly decreased breath sounds bilaterally without wheezing, rhonchi or rales. CV: Normal S1, S2 without murmurs. Skin: Warm and dry, without lesions or rashes.  The following portions of the patient's history were reviewed and updated as appropriate: allergies, current medications, past family history, past medical history, past social history, past surgical history and  problem list.  Allergies as of 01/24/2018      Reactions   Codeine Other (See Comments)   REACTION: GI upset   Lipitor [atorvastatin]    Pravachol [pravastatin Sodium]       Medication List        Accurate as of 01/24/18 12:28 PM. Always use your most recent med list.            Albuterol Sulfate 108 (90 Base) MCG/ACT Aepb Inhale 2 puffs into the lungs every 4 (four) hours.   aspirin EC 81 MG tablet Take 81 mg by mouth daily.   azelastine 0.1 % nasal spray Commonly known as:  ASTELIN Place 1-2 sprays into both nostrils 2 (two) times daily.   AZOR 10-40 MG tablet Generic drug:  amLODipine-olmesartan Take 1 tablet by mouth daily.   BEANO PO Take 1 tablet by mouth 4 (four) times daily as needed (gas, bloating).   budesonide-formoterol 160-4.5 MCG/ACT inhaler Commonly known as:  SYMBICORT Inhale 2 puffs into the lungs 2 (two) times daily.   carvedilol 3.125 MG tablet Commonly known as:  COREG Take 3.125 mg by mouth 2 (two) times daily.   Cellulose Carmellose Sodium Powd Apply 1 application topically daily as needed (irritation). Apply to feet   clindamycin-benzoyl peroxide gel Commonly known as:  BENZACLIN Apply 1 application topically daily at 3 pm.   clobetasol cream 0.05 % Commonly known as:  TEMOVATE Apply 1 application topically daily as needed (hands, arms, neck, thighs).   clotrimazole 1 % cream Commonly known as:  LOTRIMIN Apply 1 application topically daily as needed (s). For skin condition   Diclofenac Sodium 1.5 % Soln Apply 12 drops topically 2 (two) times a week. Uses 12-14 drops on scalp as needed for dryness   fluticasone 50 MCG/ACT nasal spray Commonly known as:  FLONASE Place 2 sprays into both nostrils daily.   FLUTTER Devi Use as directed   furosemide 20 MG tablet Commonly known as:  LASIX Take 1 tablet (20 mg total) by mouth daily.   ketoconazole 2 % cream Commonly known as:  NIZORAL Apply 1 application topically daily.   metFORMIN 500 MG tablet Commonly known as:  GLUCOPHAGE Take 1,000 mg by mouth Twice daily.   methocarbamol 500 MG tablet Commonly known as:  ROBAXIN Take 1 tablet (500 mg total) by mouth 2 (two) times daily.   MULTIVITAMIN & MINERAL PO Take 1 tablet by mouth daily. Reported on 06/17/2015    MYRBETRIQ 25 MG Tb24 tablet Generic drug:  mirabegron ER   omeprazole 20 MG capsule Commonly known as:  PRILOSEC Take 20 mg by mouth daily.   SHINGRIX injection Generic drug:  Zoster Vaccine Adjuvanted inject 0.5 milliliter intramuscularly   tiotropium 18 MCG inhalation capsule Commonly known as:  SPIRIVA Place 1 capsule (18 mcg total) into inhaler and inhale daily.   Tiotropium Bromide Monohydrate 1.25 MCG/ACT Aers Inhale 2 puffs into the lungs daily.   triamcinolone cream 0.1 % Commonly known as:  KENALOG Apply topically 2 (two) times daily. Reported on 06/17/2015   VICTOZA 18 MG/3ML Sopn Generic drug:  liraglutide Inject 0.6 mg into the skin daily.   vitamin C 1000 MG tablet Take 1,000 mg by mouth daily. Reported on 06/17/2015   VITAMIN D-1000 MAX ST 25 MCG (1000 UT) tablet Generic drug:  Cholecalciferol Take 1,000 Units by mouth daily. Reported on 06/17/2015   ZOLOFT PO Take by mouth.       Allergies  Allergen Reactions  . Codeine  Other (See Comments)    REACTION: GI upset  . Lipitor [Atorvastatin]   . Pravachol [Pravastatin Sodium]    Review of systems: Review of systems negative except as noted in HPI / PMHx or noted below: Constitutional: Negative.  HENT: Negative.   Eyes: Negative.  Respiratory: Negative.   Cardiovascular: Negative.  Gastrointestinal: Negative.  Genitourinary: Negative.  Musculoskeletal: Negative.  Neurological: Negative.  Endo/Heme/Allergies: Negative.  Cutaneous: Negative.  Past Medical History:  Diagnosis Date  . Agent orange exposure   . Arthritis   . Asthma   . COPD (chronic obstructive pulmonary disease) ([place])   . Diabetes mellitus   . Hyperlipemia   . Hypertension   . OSA on CPAP   . Pneumonia   . PTSD (post-traumatic stress disorder)     Family History  Problem Relation Age of Onset  . Emphysema Father   . Asthma Father   . Heart disease Father   . Heart attack Father   . Stroke Mother   . Heart attack  Brother   . Diabetes Brother   . Heart attack Brother   . Diabetes Brother   . Colon cancer Neg Hx     Social History   Socioeconomic History  . Marital status: Married    Spouse name: Not on file  . Number of children: 5  . Years of education: Not on file  . Highest education level: Not on file  Occupational History  . Occupation: Retired  Scientific laboratory technician  . Financial resource strain: Not on file  . Food insecurity:    Worry: Not on file    Inability: Not on file  . Transportation needs:    Medical: Not on file    Non-medical: Not on file  Tobacco Use  . Smoking status: Former Smoker    Packs/day: 1.00    Years: 20.00    Pack years: 20.00    Types: Cigarettes    Last attempt to quit: 03/09/1985    Years since quitting: 32.9  . Smokeless tobacco: Never Used  Substance and Sexual Activity  . Alcohol use: No  . Drug use: No  . Sexual activity: Not on file  Lifestyle  . Physical activity:    Days per week: Not on file    Minutes per session: Not on file  . Stress: Not on file  Relationships  . Social connections:    Talks on phone: Not on file    Gets together: Not on file    Attends religious service: Not on file    Active member of club or organization: Not on file    Attends meetings of clubs or organizations: Not on file    Relationship status: Not on file  . Intimate partner violence:    Fear of current or ex partner: Not on file    Emotionally abused: Not on file    Physically abused: Not on file    Forced sexual activity: Not on file  Other Topics Concern  . Not on file  Social History Narrative   Epworth Sleepiness Scale = 6 (as of 04/17/2015)    I appreciate the opportunity to take part in Jaye's care. Please do not hesitate to contact me with questions.  Sincerely,   R. Edgar Frisk, MD

## 2018-01-24 NOTE — Assessment & Plan Note (Signed)
   Continue appropriate allergen avoidance measures, montelukast 10 mg daily at bedtime, azelastine nasal spray as needed, and fluticasone nasal spray, 2 sprays per nostril daily.  Nasal saline lavage (NeilMed) has been recommended as needed and prior to medicated nasal sprays along with instructions for proper administration.

## 2018-01-24 NOTE — Patient Instructions (Addendum)
COPD/asthma  Continue Symbicort 160-4.5 g, 2 inhalations via spacer device twice daily, Spiriva Respimat 1.25 g, 2 inhalations daily, montelukast 10 mg daily bedtime, and albuterol every 4-6 hours if needed and 15 minutes prior to exercise.  Refill prescriptions have been provided.  Subjective and objective measures of pulmonary function will be followed and the treatment plan will be adjusted accordingly.  Other allergic rhinitis  Continue appropriate allergen avoidance measures, montelukast 10 mg daily at bedtime, azelastine nasal spray as needed, and fluticasone nasal spray, 2 sprays per nostril daily.  Nasal saline lavage (NeilMed) has been recommended as needed and prior to medicated nasal sprays along with instructions for proper administration.  Gastroesophageal reflux disease without esophagitis Stable.  Continue appropriate reflux list on modifications and omeprazole as prescribed.   Return in about 5 months (around 06/25/2018), or if symptoms worsen or fail to improve.

## 2018-01-24 NOTE — Assessment & Plan Note (Signed)
Stable.  Continue appropriate reflux list on modifications and omeprazole as prescribed.

## 2018-01-24 NOTE — Assessment & Plan Note (Addendum)
   Continue Symbicort 160-4.5 g, 2 inhalations via spacer device twice daily, Spiriva Respimat 1.25 g, 2 inhalations daily, montelukast 10 mg daily bedtime, and albuterol every 4-6 hours if needed and 15 minutes prior to exercise.  Refill prescriptions have been provided.  Subjective and objective measures of pulmonary function will be followed and the treatment plan will be adjusted accordingly.

## 2018-03-18 DIAGNOSIS — E118 Type 2 diabetes mellitus with unspecified complications: Secondary | ICD-10-CM | POA: Diagnosis not present

## 2018-03-18 DIAGNOSIS — I10 Essential (primary) hypertension: Secondary | ICD-10-CM | POA: Diagnosis not present

## 2018-03-18 DIAGNOSIS — Z125 Encounter for screening for malignant neoplasm of prostate: Secondary | ICD-10-CM | POA: Diagnosis not present

## 2018-03-18 DIAGNOSIS — E291 Testicular hypofunction: Secondary | ICD-10-CM | POA: Diagnosis not present

## 2018-03-30 DIAGNOSIS — I10 Essential (primary) hypertension: Secondary | ICD-10-CM | POA: Diagnosis not present

## 2018-03-30 DIAGNOSIS — E118 Type 2 diabetes mellitus with unspecified complications: Secondary | ICD-10-CM | POA: Diagnosis not present

## 2018-03-30 DIAGNOSIS — E291 Testicular hypofunction: Secondary | ICD-10-CM | POA: Diagnosis not present

## 2018-03-30 DIAGNOSIS — Z125 Encounter for screening for malignant neoplasm of prostate: Secondary | ICD-10-CM | POA: Diagnosis not present

## 2018-03-30 DIAGNOSIS — E213 Hyperparathyroidism, unspecified: Secondary | ICD-10-CM | POA: Diagnosis not present

## 2018-03-30 DIAGNOSIS — R350 Frequency of micturition: Secondary | ICD-10-CM | POA: Diagnosis not present

## 2018-03-30 DIAGNOSIS — Z0000 Encounter for general adult medical examination without abnormal findings: Secondary | ICD-10-CM | POA: Diagnosis not present

## 2018-03-30 DIAGNOSIS — E7800 Pure hypercholesterolemia, unspecified: Secondary | ICD-10-CM | POA: Diagnosis not present

## 2018-09-21 DIAGNOSIS — Z125 Encounter for screening for malignant neoplasm of prostate: Secondary | ICD-10-CM | POA: Diagnosis not present

## 2018-09-21 DIAGNOSIS — E7800 Pure hypercholesterolemia, unspecified: Secondary | ICD-10-CM | POA: Diagnosis not present

## 2018-09-21 DIAGNOSIS — I10 Essential (primary) hypertension: Secondary | ICD-10-CM | POA: Diagnosis not present

## 2018-09-21 DIAGNOSIS — R739 Hyperglycemia, unspecified: Secondary | ICD-10-CM | POA: Diagnosis not present

## 2018-09-28 DIAGNOSIS — E291 Testicular hypofunction: Secondary | ICD-10-CM | POA: Diagnosis not present

## 2018-09-28 DIAGNOSIS — R739 Hyperglycemia, unspecified: Secondary | ICD-10-CM | POA: Diagnosis not present

## 2018-09-28 DIAGNOSIS — I10 Essential (primary) hypertension: Secondary | ICD-10-CM | POA: Diagnosis not present

## 2018-09-28 DIAGNOSIS — R42 Dizziness and giddiness: Secondary | ICD-10-CM | POA: Diagnosis not present

## 2018-09-28 DIAGNOSIS — M542 Cervicalgia: Secondary | ICD-10-CM | POA: Diagnosis not present

## 2018-09-28 DIAGNOSIS — E7800 Pure hypercholesterolemia, unspecified: Secondary | ICD-10-CM | POA: Diagnosis not present

## 2018-10-10 ENCOUNTER — Other Ambulatory Visit: Payer: Self-pay

## 2018-12-01 ENCOUNTER — Other Ambulatory Visit: Payer: Self-pay

## 2018-12-01 ENCOUNTER — Ambulatory Visit: Payer: Medicare Other | Attending: Internal Medicine | Admitting: Physical Therapy

## 2018-12-01 DIAGNOSIS — R296 Repeated falls: Secondary | ICD-10-CM | POA: Diagnosis not present

## 2018-12-01 DIAGNOSIS — R262 Difficulty in walking, not elsewhere classified: Secondary | ICD-10-CM | POA: Diagnosis not present

## 2018-12-01 DIAGNOSIS — R42 Dizziness and giddiness: Secondary | ICD-10-CM | POA: Insufficient documentation

## 2018-12-01 DIAGNOSIS — H8112 Benign paroxysmal vertigo, left ear: Secondary | ICD-10-CM | POA: Insufficient documentation

## 2018-12-01 DIAGNOSIS — R2681 Unsteadiness on feet: Secondary | ICD-10-CM | POA: Diagnosis not present

## 2018-12-01 NOTE — Therapy (Signed)
[facility] [address] [place], [place], [address] Phone: [phone]   Fax:  [phone]  Physical Therapy Evaluation  Patient Details  Name: Douglas Hansen MRN: [id number] Date of Birth: Jul 10, 1945 Referring Provider (PT): Arlyss Repress, MD   Encounter Date: 12/01/2018  PT End of Session - 12/01/18 2035    Visit Number  1    Number of Visits  5    Date for PT Re-Evaluation  01/15/19    Authorization Type  Medicare, Federal BCBS, Tricare for Life; [place] has a VL: 54    Authorization - Visit Number  1    Authorization - Number of Visits  9    PT Start Time  1540    PT Stop Time  1625    PT Time Calculation (min)  45 min    Activity Tolerance  Other (comment)   nausea   Behavior During Therapy  WFL for tasks assessed/performed       Past Medical History:  Diagnosis Date  . Agent orange exposure   . Arthritis   . Asthma   . COPD (chronic obstructive pulmonary disease) ([place])   . Diabetes mellitus   . Hyperlipemia   . Hypertension   . OSA on CPAP   . Pneumonia   . PTSD (post-traumatic stress disorder)     Past Surgical History:  Procedure Laterality Date  . CARDIAC CATHETERIZATION    . right thumb surgery    . TONSILLECTOMY      There were no vitals filed for this visit.   Subjective Assessment - 12/01/18 1547    Subjective  Pt has had a h/o vertigo and recently returned.  When he gets up he starts spinning and gets dizzy.  Also has degenerative OA in neck and back that causes him pain.  Denies nausea and vomiting.  Has to sit down immediately to let it resolve.  Has been taking over the counter dramamine without much relief.    Pertinent History  PTSD, Agent Orange exposure, PNA, OSA on CPAP, HTN, HLD, DM, COPD, asthma and arthritis    Patient Stated Goals  To get rid of the dizziness and to feel more safe driving    Currently in Pain?  Yes    Pain Score  3     Pain Location  Neck    Pain  Orientation  Posterior    Pain Descriptors / Indicators  Tingling    Pain Type  Chronic pain    Pain Onset  More than a month ago    Pain Frequency  Constant         [facility] PT Assessment - 12/01/18 1553      Assessment   Medical Diagnosis  Vertigo    Referring Provider (PT)  Arlyss Repress, MD    Onset Date/Surgical Date  09/28/18      Precautions   Precautions  Other (comment)    Precaution Comments  PTSD, Agent Orange exposure, PNA, OSA on CPAP, HTN, HLD, DM, COPD, asthma and arthritis      Balance Screen   Has the patient fallen in the past 6 months  Yes    How many times?  2      [place] residence    Living Arrangements  Spouse/significant other    Type of [place] to enter    Entrance Stairs-Number of Steps  3    Entrance Stairs-Rails  Right;Left    Home Layout  Two level;Bed/bath upstairs    Alternate Level Stairs-Number of Steps  12    Alternate Level Stairs-Rails  Left    Home Equipment  Cane - single point      Prior Function   Level of Independence  Independent with household mobility with device;Independent with community mobility with device;Independent with gait;Requires assistive device for independence    Leisure  still driving      Observation/Other Assessments   Focus on Therapeutic Outcomes (FOTO)   N/A      Sensation   Light Touch  Appears Intact      Ambulation/Gait   Ambulation/Gait  Yes    Ambulation/Gait Assistance  6: Modified independent (Device/Increase time)    Ambulation Distance (Feet)  115 Feet    Assistive device  None   but typically uses a cane with quad tip   Gait Pattern  Step-through pattern;Scissoring    Ambulation Surface  Level;Indoor           Vestibular Assessment - 12/01/18 1557      Symptom Behavior   Subjective history of current problem  Reports changes in vision - reports seeing a person or a tree but then the image goes away.  Denies diplopia.  Pt  does have intermittent headaches.  Hearing loss due to [employer], denies tinnitus.    Type of Dizziness   Spinning    Frequency of Dizziness  daily    Duration of Dizziness  30 seconds to a minute    Symptom Nature  Positional;Motion provoked    Aggravating Factors  Lying supine;Supine to sit;Rolling to left    Relieving Factors  Head stationary    Progression of Symptoms  Better    History of similar episodes  h/o vertigo - treated with medication      Oculomotor Exam   Oculomotor Alignment  Normal    Ocular ROM  WFL    Spontaneous  Absent    Gaze-induced   Absent    Smooth Pursuits  Saccades    Saccades  Intact      Oculomotor Exam-Fixation Suppressed    Left Head Impulse  unable to fully assess; pt guarding, closing eyes    Right Head Impulse  unable to fully assess; pt guarding, closing eyes      Vestibulo-Ocular Reflex   VOR to Slow Head Movement  Normal    VOR Cancellation  Normal      Positional Testing   Dix-Hallpike  Dix-Hallpike Right;Dix-Hallpike Left    Horizontal Canal Testing  Horizontal Canal Right;Horizontal Canal Left      Dix-Hallpike Left   Dix-Hallpike Left Duration  10 seconds    Dix-Hallpike Left Symptoms  Upbeat, left rotatory nystagmus      Horizontal Canal Right   Horizontal Canal Right Duration  0    Horizontal Canal Right Symptoms  Normal      Horizontal Canal Left   Horizontal Canal Left Duration  5 seconds    Horizontal Canal Left Symptoms  Nystagmus   L rotary         Objective measurements completed on examination: See above findings.       Vestibular Treatment/Exercise - 12/01/18 1617      Vestibular Treatment/Exercise   Vestibular Treatment Provided  Canalith Repositioning    Canalith Repositioning  Epley Manuever Left       EPLEY MANUEVER LEFT   Number of Reps  1     RESPONSE DETAILS LEFT  unable to re assess due to nausea            PT Education - 12/01/18 2035    Education Details  clinical findings,  PT POC and goals    Person(s) Educated  Patient    Methods  Explanation    Comprehension  Verbalized understanding          PT Long Term Goals - 12/01/18 2046      PT LONG TERM GOAL #1   Title  Pt will demonstrate independence with vestibular and balance HEP    Time  4    Period  Weeks    Status  New    Target Date  12/31/18      PT LONG TERM GOAL #2   Title  Pt will demonstrate negative positional testing indicating resolution of BPPV    Baseline  L posterior canal BPPV    Time  4    Period  Weeks    Status  New    Target Date  12/31/18      PT LONG TERM GOAL #3   Title  Pt will perform rolling and supine <> sit with no symptoms of dizziness    Time  4    Period  Weeks    Status  New    Target Date  12/31/18      PT LONG TERM GOAL #4   Title  Pt will negotiate 12 stairs with L rail safely, MOD I without any reports of dizziness or unsteadiness    Time  4    Period  Weeks    Status  New    Target Date  12/31/18             Plan - 12/01/18 2043    Clinical Impression Statement  Pt is a 73 year old male referred to Neuro OPPT for evaluation of vertigo.  Pt's PMH is significant for the following: PTSD, Agent Orange exposure, PNA, OSA on CPAP, HTN, HLD, DM, COPD, asthma and arthritis. The following deficits were noted during pt's exam: vertigo and upbeating, left rotary nystagmus of short duration seen during left hallpike-dix maneuver, indicating L posterior canal canalithiasis, impaired balance and impaired gait placing patient at increased risk for falls.  Patient treated with one CRM but unable to re-assess effectiveness of treatment due to increased nausea.  Pt would benefit from skilled PT to address these impairments and functional limitations to maximize functional mobility independence and reduce falls risk.    Personal Factors and Comorbidities  Comorbidity 3+    Comorbidities  PTSD, Agent Orange exposure, PNA, OSA on CPAP, HTN, HLD, DM, COPD, asthma and  arthritis    Examination-Activity Limitations  Bed Mobility;Locomotion Level;Stairs    Examination-Participation Restrictions  Driving    Stability/Clinical Decision Making  Stable/Uncomplicated    Clinical Decision Making  Low    Rehab Potential  Good    PT Frequency  1x / week    PT Duration  4 weeks    PT Treatment/Interventions  ADLs/Self Care Home Management;Canalith Repostioning;Functional mobility training;Stair training;Gait training;Therapeutic activities;Therapeutic exercise;Balance training;Neuromuscular re-education;Patient/family education;Vestibular    PT Next Visit Plan  Re-assess L posterior canal BPPV, treat if indicated.  Assess safety during stair negotiation.  provide balance and habituation exercises if indicated    Consulted and Agree with Plan of Care  Patient       Patient will benefit from skilled therapeutic intervention in order  to improve the following deficits and impairments:  Decreased balance, Difficulty walking, Dizziness  Visit Diagnosis: BPPV (benign paroxysmal positional vertigo), left  Dizziness and giddiness  Unsteadiness on feet  Repeated falls  Difficulty in walking, not elsewhere classified     Problem List Patient Active Problem List   Diagnosis Date Noted  . Dyslipidemia 06/03/2016  . Other fatigue 06/03/2016  . Claudication ([place]) 06/03/2016  . Acute sinusitis 12/16/2015  . Asthma with acute exacerbation 06/17/2015  . Acute bronchitis 06/17/2015  . Gastroesophageal reflux disease without esophagitis 06/17/2015  . Shortness of breath 04/17/2015  . Acute pulmonary edema ([place]) 04/17/2015  . Chronic obstructive pulmonary disease, unspecified copd, unspecified chronic bronchitis type 01/07/2015  . Moderate persistent asthma 01/07/2015  . Other allergic rhinitis 01/07/2015  . Obstructive sleep apnea treated with continuous positive airway pressure (CPAP) 01/07/2015  . COPD/asthma     Class: Chronic  . Pneumonia due to other  gram-negative bacteria ([place]) 01/30/2011  . Cough 06/09/2010  . COPD (chronic obstructive pulmonary disease) ([place]) 06/09/2010  . OSA on CPAP   . Diabetes mellitus   . Hyperlipemia   . Essential hypertension     Rico Junker, PT, DPT 12/01/18    8:51 PM    [facility] [address] [place], [place], [address] Phone: [phone]   Fax:  [phone]  Name: Douglas Hansen MRN: [id number] Date of Birth: 04-26-1945

## 2018-12-14 ENCOUNTER — Ambulatory Visit: Payer: Medicare Other | Admitting: Rehabilitative and Restorative Service Providers"

## 2018-12-19 ENCOUNTER — Ambulatory Visit: Payer: Medicare Other | Attending: Internal Medicine | Admitting: Rehabilitative and Restorative Service Providers"

## 2018-12-19 DIAGNOSIS — R262 Difficulty in walking, not elsewhere classified: Secondary | ICD-10-CM | POA: Insufficient documentation

## 2018-12-19 DIAGNOSIS — R296 Repeated falls: Secondary | ICD-10-CM | POA: Insufficient documentation

## 2018-12-19 DIAGNOSIS — R42 Dizziness and giddiness: Secondary | ICD-10-CM | POA: Insufficient documentation

## 2018-12-19 DIAGNOSIS — H8111 Benign paroxysmal vertigo, right ear: Secondary | ICD-10-CM | POA: Insufficient documentation

## 2018-12-19 DIAGNOSIS — R2681 Unsteadiness on feet: Secondary | ICD-10-CM | POA: Insufficient documentation

## 2018-12-26 ENCOUNTER — Other Ambulatory Visit: Payer: Self-pay

## 2018-12-26 ENCOUNTER — Encounter: Payer: Self-pay | Admitting: Physical Therapy

## 2018-12-26 ENCOUNTER — Ambulatory Visit: Payer: Medicare Other | Admitting: Physical Therapy

## 2018-12-26 DIAGNOSIS — R262 Difficulty in walking, not elsewhere classified: Secondary | ICD-10-CM

## 2018-12-26 DIAGNOSIS — H8111 Benign paroxysmal vertigo, right ear: Secondary | ICD-10-CM | POA: Diagnosis not present

## 2018-12-26 DIAGNOSIS — R2681 Unsteadiness on feet: Secondary | ICD-10-CM

## 2018-12-26 DIAGNOSIS — R42 Dizziness and giddiness: Secondary | ICD-10-CM

## 2018-12-26 DIAGNOSIS — R296 Repeated falls: Secondary | ICD-10-CM | POA: Diagnosis not present

## 2018-12-26 NOTE — Therapy (Signed)
[facility] [address] [place], [place], [address] Phone: [phone]   Fax:  [phone]  Physical Therapy Treatment  Patient Details  Name: Douglas Hansen MRN: [id number] Date of Birth: April 04, 1945 Referring Provider (PT): Arlyss Repress, MD   Encounter Date: 12/26/2018  PT End of Session - 12/26/18 1333    Visit Number  2    Number of Visits  5    Date for PT Re-Evaluation  01/15/19    Authorization Type  Medicare, Federal BCBS, Tricare for Life; [place] has a VL: 44    Authorization - Visit Number  2    Authorization - Number of Visits  70    PT Start Time  1300   arrived late   PT Stop Time  1333    PT Time Calculation (min)  33 min    Activity Tolerance  Other (comment)   nausea   Behavior During Therapy  WFL for tasks assessed/performed       Past Medical History:  Diagnosis Date  . Agent orange exposure   . Arthritis   . Asthma   . COPD (chronic obstructive pulmonary disease) ([place])   . Diabetes mellitus   . Hyperlipemia   . Hypertension   . OSA on CPAP   . Pneumonia   . PTSD (post-traumatic stress disorder)     Past Surgical History:  Procedure Laterality Date  . CARDIAC CATHETERIZATION    . right thumb surgery    . TONSILLECTOMY      There were no vitals filed for this visit.  Subjective Assessment - 12/26/18 1306    Subjective  Pt arrives late; reports driving around trying to find the clinic for an hour.  Feeling a little better but still has some dizziness.    Pertinent History  PTSD, Agent Orange exposure, PNA, OSA on CPAP, HTN, HLD, DM, COPD, asthma and arthritis    Patient Stated Goals  To get rid of the dizziness and to feel more safe driving    Currently in Pain?  No/denies    Pain Onset  More than a month ago             Vestibular Assessment - 12/26/18 1308      Positional Testing   Dix-Hallpike  Dix-Hallpike Right;Dix-Hallpike Left    Horizontal Canal Testing   Horizontal Canal Right;Horizontal Canal Left      Dix-Hallpike Right   Dix-Hallpike Right Duration  5 seconds    Dix-Hallpike Right Symptoms  Upbeat, right rotatory nystagmus      Dix-Hallpike Left   Dix-Hallpike Left Duration  0    Dix-Hallpike Left Symptoms  No nystagmus      Horizontal Canal Right   Horizontal Canal Right Duration  0    Horizontal Canal Right Symptoms  Normal      Horizontal Canal Left   Horizontal Canal Left Duration  0    Horizontal Canal Left Symptoms  Nystagmus   L rotary               Vestibular Treatment/Exercise - 12/26/18 1317      Vestibular Treatment/Exercise   Vestibular Treatment Provided  Canalith Repositioning    Canalith Repositioning  Epley Manuever Right       EPLEY MANUEVER RIGHT   Number of Reps   3    Overall Response  No change    Response Details   each re-assessment pt continued to present with R rotary, upbeating nystagmus.  Pt with some nausea afterwards; provided pt with Ginger Ale for nausea.            PT Education - 12/26/18 1332    Education Details  BPPV on R side today; directions and map to clinic    Person(s) Educated  Patient    Methods  Explanation    Comprehension  Verbalized understanding          PT Long Term Goals - 12/26/18 1345      PT LONG TERM GOAL #1   Title  Pt will demonstrate independence with vestibular and balance HEP    Time  4    Period  Weeks    Status  New    Target Date  01/07/19   date extended due to missing one week of therapy     PT LONG TERM GOAL #2   Title  Pt will demonstrate negative positional testing indicating resolution of BPPV    Baseline  L posterior canal BPPV    Time  4    Period  Weeks    Status  New    Target Date  01/07/19      PT LONG TERM GOAL #3   Title  Pt will perform rolling and supine <> sit with no symptoms of dizziness    Time  4    Period  Weeks    Status  New    Target Date  01/07/19      PT LONG TERM GOAL #4   Title  Pt will  negotiate 12 stairs with L rail safely, MOD I without any reports of dizziness or unsteadiness    Time  4    Period  Weeks    Status  New    Target Date  01/07/19            Plan - 12/26/18 1341    Clinical Impression Statement  Pt returns with ongoing vertigo.  Today pt presented with no symptoms on L side but did present with R, upbeating nystagmus during Right hallpike dix indicating R posterior canal BPPV; treated x 3 with CRM.  Pt tolerated treatment better today but did report some nausea after 3 treatments; provided pt with ginger ale to ease nausea symptoms.  Will continue to address and treat as indicated    Personal Factors and Comorbidities  Comorbidity 3+    Comorbidities  PTSD, Agent Orange exposure, PNA, OSA on CPAP, HTN, HLD, DM, COPD, asthma and arthritis    Examination-Activity Limitations  Bed Mobility;Locomotion Level;Stairs    Examination-Participation Restrictions  Driving    Stability/Clinical Decision Making  Stable/Uncomplicated    Rehab Potential  Good    PT Frequency  1x / week    PT Duration  4 weeks    PT Treatment/Interventions  ADLs/Self Care Home Management;Canalith Repostioning;Functional mobility training;Stair training;Gait training;Therapeutic activities;Therapeutic exercise;Balance training;Neuromuscular re-education;Patient/family education;Vestibular    PT Next Visit Plan  Re-assess R and L posterior canal BPPV, treat if indicated.  Assess safety during stair negotiation.  provide balance and habituation exercises if indicated    Consulted and Agree with Plan of Care  Patient       Patient will benefit from skilled therapeutic intervention in order to improve the following deficits and impairments:  Decreased balance, Difficulty walking, Dizziness  Visit Diagnosis: BPPV (benign paroxysmal positional vertigo), right  Dizziness and giddiness  Unsteadiness on feet  Repeated falls  Difficulty in walking, not elsewhere  classified     Problem List  Patient Active Problem List   Diagnosis Date Noted  . Dyslipidemia 06/03/2016  . Other fatigue 06/03/2016  . Claudication ([place]) 06/03/2016  . Acute sinusitis 12/16/2015  . Asthma with acute exacerbation 06/17/2015  . Acute bronchitis 06/17/2015  . Gastroesophageal reflux disease without esophagitis 06/17/2015  . Shortness of breath 04/17/2015  . Acute pulmonary edema ([place]) 04/17/2015  . Chronic obstructive pulmonary disease, unspecified copd, unspecified chronic bronchitis type 01/07/2015  . Moderate persistent asthma 01/07/2015  . Other allergic rhinitis 01/07/2015  . Obstructive sleep apnea treated with continuous positive airway pressure (CPAP) 01/07/2015  . COPD/asthma     Class: Chronic  . Pneumonia due to other gram-negative bacteria ([place]) 01/30/2011  . Cough 06/09/2010  . COPD (chronic obstructive pulmonary disease) ([place]) 06/09/2010  . OSA on CPAP   . Diabetes mellitus   . Hyperlipemia   . Essential hypertension     Rico Junker, PT, DPT 12/26/18    1:47 PM    [facility] [address] [place], [place], [address] Phone: [phone]   Fax:  [phone]  Name: Jeramey Weinhardt MRN: [id number] Date of Birth: Jul 12, 1945

## 2019-01-03 ENCOUNTER — Ambulatory Visit: Payer: Medicare Other | Admitting: Physical Therapy

## 2019-01-03 ENCOUNTER — Other Ambulatory Visit: Payer: Self-pay

## 2019-01-03 DIAGNOSIS — R2681 Unsteadiness on feet: Secondary | ICD-10-CM | POA: Diagnosis not present

## 2019-01-03 DIAGNOSIS — R296 Repeated falls: Secondary | ICD-10-CM | POA: Diagnosis not present

## 2019-01-03 DIAGNOSIS — H8111 Benign paroxysmal vertigo, right ear: Secondary | ICD-10-CM | POA: Diagnosis not present

## 2019-01-03 DIAGNOSIS — R262 Difficulty in walking, not elsewhere classified: Secondary | ICD-10-CM | POA: Diagnosis not present

## 2019-01-03 DIAGNOSIS — R42 Dizziness and giddiness: Secondary | ICD-10-CM | POA: Diagnosis not present

## 2019-01-04 ENCOUNTER — Encounter: Payer: Self-pay | Admitting: Physical Therapy

## 2019-01-04 NOTE — Therapy (Signed)
[facility] [address] [place], [place], [address] Phone: [phone]   Fax:  [phone]  Physical Therapy Treatment  Patient Details  Name: Douglas Hansen MRN: [id number] Date of Birth: 23-Feb-1946 Referring Provider (PT): Arlyss Repress, MD   Encounter Date: 01/03/2019  PT End of Session - 01/04/19 0858    Visit Number  3    Number of Visits  5    Date for PT Re-Evaluation  01/15/19    Authorization Type  Medicare, Federal BCBS, Tricare for Life; [place] has a VL: 29    Authorization - Visit Number  3    Authorization - Number of Visits  6    PT Start Time  1402    PT Stop Time  1433   pt became nauseous and requested to end tx early   PT Time Calculation (min)  31 min    Activity Tolerance  Other (comment)   nausea   Behavior During Therapy  [facility] for tasks assessed/performed       Past Medical History:  Diagnosis Date  . Agent orange exposure   . Arthritis   . Asthma   . COPD (chronic obstructive pulmonary disease) ([place])   . Diabetes mellitus   . Hyperlipemia   . Hypertension   . OSA on CPAP   . Pneumonia   . PTSD (post-traumatic stress disorder)     Past Surgical History:  Procedure Laterality Date  . CARDIAC CATHETERIZATION    . right thumb surgery    . TONSILLECTOMY      There were no vitals filed for this visit.  Subjective Assessment - 01/03/19 1410    Subjective  Pt states he has been doing fine (no vertigo) until this am when he felt a flutter of dizziness - states it lasted a few seconds and then resolved    Pertinent History  PTSD, Agent Orange exposure, PNA, OSA on CPAP, HTN, HLD, DM, COPD, asthma and arthritis    Patient Stated Goals  To get rid of the dizziness and to feel more safe driving    Currently in Pain?  No/denies    Pain Onset  More than a month ago                        Vestibular Treatment/Exercise - 01/04/19 0001      Vestibular  Treatment/Exercise   Vestibular Treatment Provided  Canalith Repositioning    Canalith Repositioning  Epley Manuever Right       EPLEY MANUEVER RIGHT   Number of Reps   2   per pt's request   Overall Response  Improved Symptoms    Response Details   No nystagmus and no c/o dizziness on 2nd rep - pt reported slight nausea - was given a Coke (no Ginger Ale available) and nausea improved - pt declined 3rd rep of Epley, stating he did not want to risk the nausea increasing      NeuroRe-ed:  Rt and Lt sidelying tests (-) with no nystagmus noted; pt did c/o dizziness with Lt sidelying to return to sitting upright  But no nystagmus noted  Rt Dix-Hallpike test (+) with rotary upbeating nystagmus and c/o vertigo Lt Dix-Hallpike test (-) with no nystagmus and no c/o vertigo           PT Long Term Goals - 01/04/19 0902      PT LONG TERM GOAL #1   Title  Pt  will demonstrate independence with vestibular and balance HEP    Time  4    Period  Weeks    Status  New      PT LONG TERM GOAL #2   Title  Pt will demonstrate negative positional testing indicating resolution of BPPV    Baseline  L posterior canal BPPV    Time  4    Period  Weeks    Status  New      PT LONG TERM GOAL #3   Title  Pt will perform rolling and supine <> sit with no symptoms of dizziness    Time  4    Period  Weeks    Status  New      PT LONG TERM GOAL #4   Title  Pt will negotiate 12 stairs with L rail safely, MOD I without any reports of dizziness or unsteadiness    Time  4    Period  Weeks    Status  New            Plan - 01/04/19 0900    Clinical Impression Statement  Pt had (+) Rt Dix-Hallpike test with rotary upbeating nystagmus and c/o vertigo; Epley maneuver was administered 2 reps with apparent resolution of symptoms on 2nd rep with no nystagmus and no c/o vertigo in any position of Epley maneuver.    Personal Factors and Comorbidities  Comorbidity 3+    Comorbidities  PTSD, Agent Orange  exposure, PNA, OSA on CPAP, HTN, HLD, DM, COPD, asthma and arthritis    Examination-Activity Limitations  Bed Mobility;Locomotion Level;Stairs    Examination-Participation Restrictions  Driving    Stability/Clinical Decision Making  Stable/Uncomplicated    Rehab Potential  Good    PT Frequency  1x / week    PT Duration  4 weeks    PT Treatment/Interventions  ADLs/Self Care Home Management;Canalith Repostioning;Functional mobility training;Stair training;Gait training;Therapeutic activities;Therapeutic exercise;Balance training;Neuromuscular re-education;Patient/family education;Vestibular    PT Next Visit Plan  Re-assess R and L posterior canal BPPV, treat if indicated.  Assess safety during stair negotiation.  provide balance and habituation exercises if indicated    Consulted and Agree with Plan of Care  Patient       Patient will benefit from skilled therapeutic intervention in order to improve the following deficits and impairments:  Decreased balance, Difficulty walking, Dizziness  Visit Diagnosis: BPPV (benign paroxysmal positional vertigo), right     Problem List Patient Active Problem List   Diagnosis Date Noted  . Dyslipidemia 06/03/2016  . Other fatigue 06/03/2016  . Claudication ([place]) 06/03/2016  . Acute sinusitis 12/16/2015  . Asthma with acute exacerbation 06/17/2015  . Acute bronchitis 06/17/2015  . Gastroesophageal reflux disease without esophagitis 06/17/2015  . Shortness of breath 04/17/2015  . Acute pulmonary edema ([place]) 04/17/2015  . Chronic obstructive pulmonary disease, unspecified copd, unspecified chronic bronchitis type 01/07/2015  . Moderate persistent asthma 01/07/2015  . Other allergic rhinitis 01/07/2015  . Obstructive sleep apnea treated with continuous positive airway pressure (CPAP) 01/07/2015  . COPD/asthma     Class: Chronic  . Pneumonia due to other gram-negative bacteria ([place]) 01/30/2011  . Cough 06/09/2010  . COPD (chronic obstructive  pulmonary disease) ([place]) 06/09/2010  . OSA on CPAP   . Diabetes mellitus   . Hyperlipemia   . Essential hypertension     Shamell Suarez, [place], PT 01/04/2019, 9:03 AM  [facility] [address] [place], [place], [address] Phone: [phone]  Fax:  [phone]  Name: Douglas Hansen MRN: [id number] Date of Birth: 16-Jul-1945

## 2019-01-12 ENCOUNTER — Ambulatory Visit: Payer: Medicare Other | Admitting: Physical Therapy

## 2019-01-19 ENCOUNTER — Ambulatory Visit: Payer: Medicare Other | Admitting: Physical Therapy

## 2019-01-26 ENCOUNTER — Other Ambulatory Visit: Payer: Self-pay

## 2019-01-26 ENCOUNTER — Encounter: Payer: Self-pay | Admitting: Physical Therapy

## 2019-01-26 ENCOUNTER — Ambulatory Visit: Payer: Medicare Other | Attending: Internal Medicine | Admitting: Physical Therapy

## 2019-01-26 DIAGNOSIS — R2681 Unsteadiness on feet: Secondary | ICD-10-CM

## 2019-01-26 DIAGNOSIS — H8111 Benign paroxysmal vertigo, right ear: Secondary | ICD-10-CM

## 2019-01-26 DIAGNOSIS — R42 Dizziness and giddiness: Secondary | ICD-10-CM | POA: Diagnosis not present

## 2019-01-26 DIAGNOSIS — R262 Difficulty in walking, not elsewhere classified: Secondary | ICD-10-CM | POA: Insufficient documentation

## 2019-01-26 DIAGNOSIS — H8112 Benign paroxysmal vertigo, left ear: Secondary | ICD-10-CM | POA: Insufficient documentation

## 2019-01-26 DIAGNOSIS — R296 Repeated falls: Secondary | ICD-10-CM | POA: Insufficient documentation

## 2019-01-26 NOTE — Patient Instructions (Signed)
Feet Together (Compliant Surface) Head Motion - Eyes Open    With eyes open, standing on compliant surface: Pillow, feet together, move head slowly: up and down 10 times.  Rest and then turn your head left and right slowly, 10 times. Repeat 2 times per session. Do 2 sessions per day.    SINGLE LIMB STANCE    Stance: single leg on floor. Raise leg. Hold __10_ seconds. Repeat with other leg. _3__ reps per set, _2__ sets per day, _5__ days per week  Copyright  VHI. All rights reserved.     Feet Heel-Toe "Tandem"    Stand with one hand touching counter for support; walk a straight line bringing one foot directly in front of the other. Repeat for 4 laps per session. Do __2__ sessions per day.

## 2019-01-26 NOTE — Therapy (Signed)
[facility] [address] [place], [place], [address] Phone: [phone]   Fax:  [phone]  Physical Therapy Treatment  Patient Details  Name: Douglas Hansen MRN: [id number] Date of Birth: 07-19-1945 Referring Provider (PT): Arlyss Repress, MD   Encounter Date: 01/26/2019  PT End of Session - 01/26/19 1233    Visit Number  4    Number of Visits  8    Date for PT Re-Evaluation  03/09/19    Authorization Type  Medicare, Federal BCBS, Tricare for Life; [place] has a VL: 47    Authorization - Visit Number  4    Authorization - Number of Visits  62    PT Start Time  1234    PT Stop Time  1314    PT Time Calculation (min)  40 min    Activity Tolerance  Patient tolerated treatment well    Behavior During Therapy  WFL for tasks assessed/performed       Past Medical History:  Diagnosis Date  . Agent orange exposure   . Arthritis   . Asthma   . COPD (chronic obstructive pulmonary disease) ([place])   . Diabetes mellitus   . Hyperlipemia   . Hypertension   . OSA on CPAP   . Pneumonia   . PTSD (post-traumatic stress disorder)     Past Surgical History:  Procedure Laterality Date  . CARDIAC CATHETERIZATION    . right thumb surgery    . TONSILLECTOMY      There were no vitals filed for this visit.  Subjective Assessment - 01/26/19 1232    Subjective  Pt states no vertigo sx since last session. Overall doing well. His balance is 'okay, about the same.'    Pertinent History  PTSD, Agent Orange exposure, PNA, OSA on CPAP, HTN, HLD, DM, COPD, asthma and arthritis    Patient Stated Goals  To get rid of the dizziness and to feel more safe driving    Currently in Pain?  No/denies    Pain Onset  More than a month ago             Vestibular Assessment - 01/26/19 1242      Positional Testing   Dix-Hallpike  Dix-Hallpike Right;Dix-Hallpike Left    Horizontal Canal Testing  Horizontal Canal Right;Horizontal Canal  Left      Dix-Hallpike Right   Dix-Hallpike Right Duration  0    Dix-Hallpike Right Symptoms  No nystagmus      Dix-Hallpike Left   Dix-Hallpike Left Duration  0    Dix-Hallpike Left Symptoms  No nystagmus      Horizontal Canal Right   Horizontal Canal Right Duration  0    Horizontal Canal Right Symptoms  Normal      Horizontal Canal Left   Horizontal Canal Left Duration  0    Horizontal Canal Left Symptoms  Normal               [facility] Adult PT Treatment/Exercise - 01/26/19 1239      Ambulation/Gait   Ambulation/Gait  --    Ambulation/Gait Assistance  --    Ambulation Distance (Feet)  --    Assistive device  --    Gait Pattern  --    Stairs  Yes    Stairs Assistance  6: Modified independent (Device/Increase time)   no reports of dizziness or unsteadiness, dec time   Stair Management Technique  One rail Left;Alternating pattern    Number  of Stairs  12    Height of Stairs  6      Balance   Balance Assessed  Yes      Static Standing Balance   Static Standing - Comment/# of Minutes  mCTSIB: 30s for condition 1 & 2 independently, 30s for conditions 3 & 4 with significantly swaying in all directions requiring SBA-CGA for safety EC > EO. Pt demonstrated significant hip strategy with condition 4.    pt states L ankle weakness makes it difficult on foam        Balance Exercises - 01/26/19 1254      Balance Exercises: Standing   Standing Eyes Opened  Narrow base of support (BOS);Head turns;Foam/compliant surface;Other reps (comment)   2x5 ea. direction horizontal and vertical    SLS  Eyes open;Solid surface;2 reps;10 secs   alt LE, bilateral 2 finger touch > uni 2 finger touch RLE    Tandem Gait  Forward;Upper extremity support   4 laps at counter with unilateral support and supervision       PT Education - 01/26/19 1305    Education Details  Updated balance HEP    Person(s) Educated  Patient    Methods  Explanation;Demonstration;Verbal cues;Handout     Comprehension  Verbalized understanding;Returned demonstration;Verbal cues required      Feet Together (Compliant Surface) Head Motion - Eyes Open    With eyes open, standing on compliant surface: Pillow, feet together, move head slowly: up and down 10 times.  Rest and then turn your head left and right slowly, 10 times. Repeat 2 times per session. Do 2 sessions per day.    SINGLE LIMB STANCE    Stance: single leg on floor. Raise leg. Hold __10_ seconds. Repeat with other leg. _3__ reps per set, _2__ sets per day, _5__ days per week  Copyright  VHI. All rights reserved.     Feet Heel-Toe "Tandem"    Stand with one hand touching counter for support; walk a straight line bringing one foot directly in front of the other. Repeat for 4 laps per session. Do __2__ sessions per day.         PT Long Term Goals - 01/26/19 1243      PT LONG TERM GOAL #1   Title  Pt will demonstrate independence with vestibular and balance HEP    Time  6    Period  Weeks    Status  Revised    Target Date  03/09/19      PT LONG TERM GOAL #2   Title  Pt will demonstrate negative positional testing indicating resolution of BPPV    Baseline  01/26/19: MET    Time  --    Period  --    Status  Achieved      PT LONG TERM GOAL #3   Title  Pt will perform rolling and supine <> sit with no symptoms of dizziness    Baseline  01/26/19: MET    Time  --    Period  --    Status  Achieved      PT LONG TERM GOAL #4   Title  Pt will negotiate 12 stairs with L rail safely, MOD I without any reports of dizziness or unsteadiness    Baseline  01/26/19: MET    Time  --    Period  --    Status  Achieved      PT LONG TERM GOAL #5   Title  Pt will  demonstrate mCTSIB with supervision only, condition 4 with minimal sway x 30 seconds    Time  6    Period  Weeks    Status  New    Target Date  03/09/19      Additional Long Term Goals   Additional Long Term Goals  Yes      PT LONG TERM GOAL #6    Title  Pt will demonstrate SLS >/= 10 seconds bilaterally without UE support with supervison.    Time  6    Period  Weeks    Status  New    Target Date  03/09/19      PT LONG TERM GOAL #7   Title  FGA to be assessed at next session    Baseline  TBD    Time  4    Period  Weeks    Status  New    Target Date  03/09/19            Plan - 01/26/19 1234    Clinical Impression Statement  Pt demonstrated (-) BPPV testing in all positions for posterior and horizontal canal BPPV with reports of no dizziness and no nystagmus. He demonstrated good ability to perform stairs with unilateral railing reporting no vertigo sx. He has made excellent progress towards achieving primary LTGs regarding vertigo sx. Pt was challenged with balance on a compliant surface and when vision was taken out, narrow BOS, and SLS primarily LLE > RLE. He has noticeable glute med weakness as seen by pelvic drop during SLS and minimally during gait. Pt will benefit from skilled therapy services to address deficits and progress towards achieving functional goals.    Personal Factors and Comorbidities  Comorbidity 3+    Comorbidities  PTSD, Agent Orange exposure, PNA, OSA on CPAP, HTN, HLD, DM, COPD, asthma and arthritis    Examination-Activity Limitations  Bed Mobility;Locomotion Level;Stairs    Examination-Participation Restrictions  Driving    Stability/Clinical Decision Making  Stable/Uncomplicated    Rehab Potential  Good    PT Frequency  1x / week    PT Duration  6 weeks    PT Treatment/Interventions  ADLs/Self Care Home Management;Canalith Repostioning;Functional mobility training;Stair training;Gait training;Therapeutic activities;Therapeutic exercise;Balance training;Neuromuscular re-education;Patient/family education;Vestibular    PT Next Visit Plan  Assess FGA and reset goal baseline. Continue LLE strengthening - especially glutes and balance activities; ankle and hip strategy balance training    Consulted and  Agree with Plan of Care  Patient       Patient will benefit from skilled therapeutic intervention in order to improve the following deficits and impairments:  Decreased balance, Difficulty walking, Dizziness  Visit Diagnosis: Dizziness and giddiness  Unsteadiness on feet  Repeated falls  Difficulty in walking, not elsewhere classified  BPPV (benign paroxysmal positional vertigo), right  BPPV (benign paroxysmal positional vertigo), left     Problem List Patient Active Problem List   Diagnosis Date Noted  . Dyslipidemia 06/03/2016  . Other fatigue 06/03/2016  . Claudication ([place]) 06/03/2016  . Acute sinusitis 12/16/2015  . Asthma with acute exacerbation 06/17/2015  . Acute bronchitis 06/17/2015  . Gastroesophageal reflux disease without esophagitis 06/17/2015  . Shortness of breath 04/17/2015  . Acute pulmonary edema ([place]) 04/17/2015  . Chronic obstructive pulmonary disease, unspecified copd, unspecified chronic bronchitis type 01/07/2015  . Moderate persistent asthma 01/07/2015  . Other allergic rhinitis 01/07/2015  . Obstructive sleep apnea treated with continuous positive airway pressure (CPAP) 01/07/2015  . COPD/asthma  Class: Chronic  . Pneumonia due to other gram-negative bacteria ([place]) 01/30/2011  . Cough 06/09/2010  . COPD (chronic obstructive pulmonary disease) ([place]) 06/09/2010  . OSA on CPAP   . Diabetes mellitus   . Hyperlipemia   . Essential hypertension    Juliann Pulse SPT 01/26/19, 1:26 PM  Juliann Pulse 01/26/2019, 1:35 PM   Rico Junker, PT, DPT 01/26/19    7:33 PM   [facility] [address] [place], [place], [address] Phone: [phone]   Fax:  [phone]  Name: Burlon Centrella MRN: [id number] Date of Birth: [birth year]

## 2019-02-09 ENCOUNTER — Ambulatory Visit: Payer: Medicare Other

## 2019-02-16 ENCOUNTER — Ambulatory Visit: Payer: Medicare Other

## 2019-02-28 ENCOUNTER — Encounter: Payer: Medicare Other | Admitting: Physical Therapy

## 2019-03-07 ENCOUNTER — Encounter: Payer: Medicare Other | Admitting: Physical Therapy

## 2019-03-27 DIAGNOSIS — E291 Testicular hypofunction: Secondary | ICD-10-CM | POA: Diagnosis not present

## 2019-03-27 DIAGNOSIS — E118 Type 2 diabetes mellitus with unspecified complications: Secondary | ICD-10-CM | POA: Diagnosis not present

## 2019-03-27 DIAGNOSIS — I10 Essential (primary) hypertension: Secondary | ICD-10-CM | POA: Diagnosis not present

## 2019-04-03 DIAGNOSIS — E118 Type 2 diabetes mellitus with unspecified complications: Secondary | ICD-10-CM | POA: Diagnosis not present

## 2019-04-03 DIAGNOSIS — R1310 Dysphagia, unspecified: Secondary | ICD-10-CM | POA: Diagnosis not present

## 2019-04-03 DIAGNOSIS — N400 Enlarged prostate without lower urinary tract symptoms: Secondary | ICD-10-CM | POA: Diagnosis not present

## 2019-04-03 DIAGNOSIS — R350 Frequency of micturition: Secondary | ICD-10-CM | POA: Diagnosis not present

## 2019-04-03 DIAGNOSIS — Z0000 Encounter for general adult medical examination without abnormal findings: Secondary | ICD-10-CM | POA: Diagnosis not present

## 2019-04-03 DIAGNOSIS — I10 Essential (primary) hypertension: Secondary | ICD-10-CM | POA: Diagnosis not present

## 2019-04-03 DIAGNOSIS — R0609 Other forms of dyspnea: Secondary | ICD-10-CM | POA: Diagnosis not present

## 2019-04-03 DIAGNOSIS — E7800 Pure hypercholesterolemia, unspecified: Secondary | ICD-10-CM | POA: Diagnosis not present

## 2019-04-07 ENCOUNTER — Encounter (INDEPENDENT_AMBULATORY_CARE_PROVIDER_SITE_OTHER): Payer: Self-pay

## 2019-04-07 ENCOUNTER — Encounter: Payer: Self-pay | Admitting: Internal Medicine

## 2019-04-07 ENCOUNTER — Ambulatory Visit (INDEPENDENT_AMBULATORY_CARE_PROVIDER_SITE_OTHER): Payer: Medicare Other | Admitting: Internal Medicine

## 2019-04-07 ENCOUNTER — Other Ambulatory Visit: Payer: Self-pay

## 2019-04-07 VITALS — BP 151/75 | HR 69 | Ht 66.0 in | Wt 195.0 lb

## 2019-04-07 DIAGNOSIS — R0602 Shortness of breath: Secondary | ICD-10-CM | POA: Diagnosis not present

## 2019-04-07 DIAGNOSIS — J449 Chronic obstructive pulmonary disease, unspecified: Secondary | ICD-10-CM | POA: Diagnosis not present

## 2019-04-07 DIAGNOSIS — I10 Essential (primary) hypertension: Secondary | ICD-10-CM

## 2019-04-07 DIAGNOSIS — I272 Pulmonary hypertension, unspecified: Secondary | ICD-10-CM | POA: Diagnosis not present

## 2019-04-07 NOTE — Progress Notes (Signed)
OFFICE NOTE  Chief Complaint:  Shortness of breath  Primary Care Physician: [name], MD  HPI:  Douglas Hansen is a pleasant 74 year old male who is currently referred to me by Dr. Maudie Mercury. His past [name] history is extensive and includes the following: Hypertension, dyslipidemia, type 2 diabetes, COPD, obstructive sleep apnea on CPAP, GERD, mild PAD, and significant allergic symptoms. He was previously followed by Dr. Einar Gip and has had 2 cardiac catheterizations in his past. His last heart catheterization was in 2004 which showed no significant coronary disease. He had a stress test in February 2014 which was negative for ischemia and EF of 68%. He also had ABIs for leg pain which were 0.9 on the right and 0.94 on the left. He was started on Pletal but had some shortness of breath and abnormal symptoms on it and this was discontinued. His last echocardiogram was in March 2014 showed an EF of 52% with mild to moderate right atrial enlargement, dilated IVC and elevated right atrial pressure. Recently had an upper respiratory infection concerning for COPD exacerbation. He was started on Avelox which she completed. At the end of treatment he felt some improvement in his breathing but then started to have shortness of breath again and cough. He was switched to azithromycin and placed on a prednisone Dosepak. He was also started on low-dose Lasix for chest x-ray findings of some fluid in the right fissure. Mr. Linstrom says that he gained a few pounds in weight with this shortness of breath and noted that he's been on Lasix now for 10 days and that he's lost some weight and feels less fullness in his belly and in his chest. He denied any ankle edema. His shortness of breath is improving. He also denies any chest pain or anginal symptoms. He had a mild amount of orthopnea which is also resolving.   Mr. Tilley returns today for follow-up of his echocardiogram. This showed normal LV function with mild pulmonary  hypertension and elevated RVSP of 35 mmHg. This is likely secondary to COPD, but does not explain his shortness of breath. He is currently on Lasix 20 mg daily has not noted a significant improvement in his shortness of breath.  06/02/2016  Mr. Silveri was seen today in follow-up. Overall he seems to be doing well. Blood pressure initially was elevated 162/84. Weight is down from 196-185 pounds. He denies any chest pain. He is short of breath with exertion however it is difficult to tell whether this is related to COPD or not. He has reported some pain in both legs when walking. He says it seems to come on after walking certain distances and gets better at rest, reminiscent of possible claudication.  09/14/2016  Mr. Zeidan returns today for follow-up. He still has concerns about bilateral leg pain with walking. He underwent ABIs and lower extremity arterial Dopplers which were normal. He also underwent a nuclear stress test because worsening shortness of breath and was found to have no evidence of ischemia with normal LV function. He's required increasing use of his inhalers recently and I suspect his symptoms may be related to COPD.  12/09/2017  Mr. Menna is seen today for routine follow-up.  Overall he seems to be doing well.  He says his shortness of breath is been well controlled on inhalers.  Blood pressure is at goal today 128/84.  EKG shows sinus rhythm at 71 with stable inferior and anterior Q waves.  He denies any new chest pain or  new significant cardiac symptoms.  His only other major concern today is over the past 1 to 2 months he has had significant, drenching night sweats.  There is been no associated weight loss or change in appetite, but he is somewhat disturbed by this.  Labs from January 2019 showed total cholesterol 107, HDL 44, LDL 42 and triglycerides 107.  Hemoglobin A1c was 6.2.  04/07/2019  Mr. Lycan returns today for follow-up.  He was referred back by his PCP for shortness of  breath.  He reports possibly a slightly worsening shortness of breath when walking.  He does have COPD on inhalers.  An echo was performed 3 years ago which showed some mild pulmonary hypertension with RVSP 35 mmHg.  He had stress testing in 2018 which showed no ischemia.  He had a heart cath in 2004 which showed no coronary disease.  PMHx:  Past Medical History:  Diagnosis Date  . Agent orange exposure   . Arthritis   . Asthma   . COPD (chronic obstructive pulmonary disease) ([place])   . Diabetes mellitus   . Hyperlipemia   . Hypertension   . OSA on CPAP   . Pneumonia   . PTSD (post-traumatic stress disorder)     Past Surgical History:  Procedure Laterality Date  . CARDIAC CATHETERIZATION    . right thumb surgery    . TONSILLECTOMY      FAMHx:  Family History  Problem Relation Age of Onset  . Emphysema Father   . Asthma Father   . Heart disease Father   . Heart attack Father   . Stroke Mother   . Heart attack Brother   . Diabetes Brother   . Heart attack Brother   . Diabetes Brother   . Colon cancer Neg Hx     SOCHx:   reports that he quit smoking about 34 years ago. His smoking use included cigarettes. He has a 20.00 pack-year smoking history. He has never used smokeless tobacco. He reports that he does not drink alcohol or use drugs.  ALLERGIES:  Allergies  Allergen Reactions  . Codeine Other (See Comments)    REACTION: GI upset  . Lipitor [Atorvastatin]   . Pravachol [Pravastatin Sodium]     ROS: Pertinent items noted in HPI and remainder of comprehensive ROS otherwise negative.  HOME MEDS: Current Outpatient Medications  Medication Sig Dispense Refill  . Albuterol Sulfate 108 (90 Base) MCG/ACT AEPB Inhale 2 puffs into the lungs every 4 (four) hours. 1 each 1  . Alpha-D-Galactosidase (BEANO PO) Take 1 tablet by mouth 4 (four) times daily as needed (gas, bloating).     Marland Kitchen amLODipine-olmesartan (AZOR) 10-40 MG per tablet Take 1 tablet by mouth daily.      .  Ascorbic Acid (VITAMIN C) 1000 MG tablet Take 1,000 mg by mouth daily. Reported on 06/17/2015    . aspirin EC 325 MG tablet Take 325 mg by mouth daily.     Marland Kitchen azelastine (ASTELIN) 0.1 % nasal spray Place 1-2 sprays into both nostrils 2 (two) times daily. 30 mL 5  . budesonide-formoterol (SYMBICORT) 160-4.5 MCG/ACT inhaler Inhale 2 puffs into the lungs 2 (two) times daily. 1 Inhaler 5  . carvedilol (COREG) 3.125 MG tablet Take 3.125 mg by mouth 2 (two) times daily.   1  . Cellulose Carmellose Sodium POWD Apply 1 application topically daily as needed (irritation). Apply to feet    . Cholecalciferol (VITAMIN D-1000 MAX ST) 1000 UNITS tablet Take 1,000 Units by  mouth daily. Reported on 06/17/2015    . clindamycin-benzoyl peroxide (BENZACLIN) gel Apply 1 application topically daily at 3 pm.     . clobetasol (TEMOVATE) 0.05 % cream Apply 1 application topically daily as needed (hands, arms, neck, thighs).     . clotrimazole (LOTRIMIN) 1 % cream Apply 1 application topically daily as needed (s). For skin condition    . Diclofenac Sodium 1.5 % SOLN Apply 12 drops topically 2 (two) times a week. Uses 12-14 drops on scalp as needed for dryness  0  . fluticasone (FLONASE) 50 MCG/ACT nasal spray Place 2 sprays into both nostrils daily. 16 g 5  . furosemide (LASIX) 20 MG tablet Take 1 tablet (20 mg total) by mouth daily. 90 tablet 3  . ketoconazole (NIZORAL) 2 % cream Apply 1 application topically daily.     . metFORMIN (GLUCOPHAGE) 500 MG tablet Take 1,000 mg by mouth Twice daily.     . methocarbamol (ROBAXIN) 500 MG tablet Take 1 tablet (500 mg total) by mouth 2 (two) times daily. 20 tablet 0  . Multiple Vitamins-Minerals (MULTIVITAMIN & MINERAL PO) Take 1 tablet by mouth daily. Reported on 06/17/2015    . Respiratory Therapy Supplies (FLUTTER) DEVI Use as directed 1 each 0  . rosuvastatin (CRESTOR) 10 MG tablet Take 10 mg by mouth daily.    . Sertraline HCl (ZOLOFT PO) Take by mouth.    Marland Kitchen SHINGRIX injection  inject 0.5 milliliter intramuscularly  0  . tiotropium (SPIRIVA) 18 MCG inhalation capsule Place 1 capsule (18 mcg total) into inhaler and inhale daily. 30 capsule 3  . Tiotropium Bromide Monohydrate (SPIRIVA RESPIMAT) 1.25 MCG/ACT AERS Inhale 2 puffs into the lungs daily. 4 g 5  . triamcinolone (KENALOG) 0.1 % cream Apply topically 2 (two) times daily. Reported on 06/17/2015     No current facility-administered medications for this visit.    LABS/IMAGING: No results found for this or any previous visit (from the past 48 hour(s)). No results found.  WEIGHTS: Wt Readings from Last 3 Encounters:  04/07/19 195 lb (88.5 kg)  12/09/17 182 lb 12.8 oz (82.9 kg)  10/12/17 182 lb (82.6 kg)    VITALS: BP (!) 151/75   Pulse 69   Ht 5\' 6"  (1.676 m)   Wt 195 lb (88.5 kg)   SpO2 95%   BMI 31.47 kg/m   EXAM: General appearance: alert and no distress Neck: no carotid bruit, no JVD and thyroid not enlarged, symmetric, no tenderness/mass/nodules Lungs: clear to auscultation bilaterally Heart: regular rate and rhythm, S1, S2 normal, no murmur, click, rub or gallop Abdomen: soft, non-tender; bowel sounds normal; no masses,  no organomegaly Extremities: extremities normal, atraumatic, no cyanosis or edema Pulses: 2+ and symmetric Skin: Skin color, texture, turgor normal. No rashes or lesions Neurologic: Grossly normal Psych: Pleasant  EKG: Sinus rhythm at 69, inferior infarct pattern-personally reviewed  ASSESSMENT: 1. Dyspnea on exertion - normal LVEF 65-70%, DD, mild pulm HTN, low risk Myoview-negative for ischemia (06/2016) 2. Leg pain concerning for claudication- history of mildly reduced ABIs 3. COPD with acute exacerbation 4. Hypertension 5. Dyslipidemia 6. OSA on CPAP 7. Type 2 diabetes  PLAN: 1.   Mr. Ackermann continues to have some dyspnea on exertion.  His last echo showed some mild pulmonary hypertension.  This could be related to his COPD.  He had a low risk Myoview 2 years  ago.  We will go ahead and repeat an echo to reevaluate his pulmonary pressures and LV function.  He reports occasional wheezing and increased cough which is productive and suggestive of a need for possibly better control of his COPD as a cause of his shortness of breath.  Plan follow-up with me afterwards.  Pixie Casino, MD, [facility], [place] Director of the Advanced Lipid Disorders &  Cardiovascular Risk Reduction Clinic Diplomate of the American Board of Clinical Lipidology Attending Cardiologist  Direct Dial: [phone]  Fax: [phone]  Website:  [email or web address] 04/07/2019, 4:04 PM

## 2019-04-07 NOTE — Patient Instructions (Signed)
Medication Instructions:  Your physician recommends that you continue on your current medications as directed. Please refer to the Current Medication list given to you today.  *If you need a refill on your cardiac medications before your next appointment, please call your pharmacy*  Testing/Procedures: Your physician has requested that you have an echocardiogram. Echocardiography is a painless test that uses sound waves to create images of your heart. It provides your doctor with information about the size and shape of your heart and how well your heart's chambers and valves are working. This procedure takes approximately one hour. There are no restrictions for this procedure. -- [address] - [address]rd Floor  Follow-Up: At [facility], you and your health needs are our priority.  As part of our continuing mission to provide you with exceptional heart care, we have created designated Provider Care Teams.  These Care Teams include your primary Cardiologist (physician) and Advanced Practice Providers (APPs -  Physician Assistants and Nurse Practitioners) who all work together to provide you with the care you need, when you need it.  Your next appointment:   After echo testing  The format for your next appointment:   In Person  Provider:   K. Mali Hilty, MD  Other Instructions

## 2019-04-10 DIAGNOSIS — R1310 Dysphagia, unspecified: Secondary | ICD-10-CM | POA: Diagnosis not present

## 2019-04-10 DIAGNOSIS — E119 Type 2 diabetes mellitus without complications: Secondary | ICD-10-CM | POA: Diagnosis not present

## 2019-04-10 DIAGNOSIS — K219 Gastro-esophageal reflux disease without esophagitis: Secondary | ICD-10-CM | POA: Diagnosis not present

## 2019-04-10 DIAGNOSIS — R140 Abdominal distension (gaseous): Secondary | ICD-10-CM | POA: Diagnosis not present

## 2019-04-10 DIAGNOSIS — J449 Chronic obstructive pulmonary disease, unspecified: Secondary | ICD-10-CM | POA: Diagnosis not present

## 2019-04-11 DIAGNOSIS — E7800 Pure hypercholesterolemia, unspecified: Secondary | ICD-10-CM | POA: Diagnosis not present

## 2019-04-11 DIAGNOSIS — E213 Hyperparathyroidism, unspecified: Secondary | ICD-10-CM | POA: Diagnosis not present

## 2019-04-11 DIAGNOSIS — I10 Essential (primary) hypertension: Secondary | ICD-10-CM | POA: Diagnosis not present

## 2019-04-11 DIAGNOSIS — E119 Type 2 diabetes mellitus without complications: Secondary | ICD-10-CM | POA: Diagnosis not present

## 2019-04-14 DIAGNOSIS — E213 Hyperparathyroidism, unspecified: Secondary | ICD-10-CM | POA: Diagnosis not present

## 2019-04-17 ENCOUNTER — Encounter: Payer: Self-pay | Admitting: Physical Therapy

## 2019-04-17 NOTE — Therapy (Signed)
[facility] [address] [place], [place], [address] Phone: [phone]   Fax:  [phone]  Patient Details  Name: Douglas Hansen MRN: [id number] Date of Birth: 1945-10-10 Referring Provider:  No ref. provider found  Encounter Date: 04/17/2019  PHYSICAL THERAPY DISCHARGE SUMMARY  Visits from Start of Care: 4  Current functional level related to goals / functional outcomes: Unable to formally assess; pt cancelled remaining 4 visits and did not contact clinic to reschedule.  Positional vertigo did resolve with treatment.   Remaining deficits: Dizziness, impaired balance   Education / Equipment: Balance and vestibular HEP  Plan: Patient agrees to discharge.  Patient goals were not met. Patient is being discharged due to not returning since the last visit.  ?????     Rico Junker, PT, DPT 04/17/19    4:06 PM    [facility] [address] [place], [place], [address] Phone: [phone]   Fax:  [phone]

## 2019-04-24 ENCOUNTER — Ambulatory Visit (HOSPITAL_COMMUNITY): Payer: Medicare Other | Attending: Cardiology

## 2019-04-24 ENCOUNTER — Other Ambulatory Visit: Payer: Self-pay

## 2019-04-24 DIAGNOSIS — I272 Pulmonary hypertension, unspecified: Secondary | ICD-10-CM | POA: Diagnosis not present

## 2019-04-24 DIAGNOSIS — R0602 Shortness of breath: Secondary | ICD-10-CM | POA: Diagnosis not present

## 2019-05-01 DIAGNOSIS — R351 Nocturia: Secondary | ICD-10-CM | POA: Diagnosis not present

## 2019-05-01 DIAGNOSIS — R350 Frequency of micturition: Secondary | ICD-10-CM | POA: Diagnosis not present

## 2019-05-10 ENCOUNTER — Ambulatory Visit: Payer: Medicare Other | Admitting: Internal Medicine

## 2019-06-01 DIAGNOSIS — K319 Disease of stomach and duodenum, unspecified: Secondary | ICD-10-CM | POA: Diagnosis not present

## 2019-06-01 DIAGNOSIS — R1310 Dysphagia, unspecified: Secondary | ICD-10-CM | POA: Diagnosis not present

## 2019-06-01 DIAGNOSIS — K219 Gastro-esophageal reflux disease without esophagitis: Secondary | ICD-10-CM | POA: Diagnosis not present

## 2019-06-01 DIAGNOSIS — K2970 Gastritis, unspecified, without bleeding: Secondary | ICD-10-CM | POA: Diagnosis not present

## 2019-06-07 ENCOUNTER — Encounter: Payer: Self-pay | Admitting: Internal Medicine

## 2019-06-07 ENCOUNTER — Ambulatory Visit (INDEPENDENT_AMBULATORY_CARE_PROVIDER_SITE_OTHER): Payer: Medicare Other | Admitting: Internal Medicine

## 2019-06-07 ENCOUNTER — Other Ambulatory Visit: Payer: Self-pay

## 2019-06-07 VITALS — BP 138/76 | HR 66 | Temp 96.3°F | Ht 66.0 in | Wt 197.8 lb

## 2019-06-07 DIAGNOSIS — J449 Chronic obstructive pulmonary disease, unspecified: Secondary | ICD-10-CM

## 2019-06-07 DIAGNOSIS — R0602 Shortness of breath: Secondary | ICD-10-CM

## 2019-06-07 DIAGNOSIS — I272 Pulmonary hypertension, unspecified: Secondary | ICD-10-CM

## 2019-06-07 DIAGNOSIS — I10 Essential (primary) hypertension: Secondary | ICD-10-CM | POA: Diagnosis not present

## 2019-06-07 MED ORDER — FUROSEMIDE 40 MG PO TABS: 40.0000 mg | ORAL_TABLET | Freq: Every day | ORAL | 3 refills | Status: DC

## 2019-06-07 NOTE — Progress Notes (Signed)
OFFICE NOTE  Chief Complaint:  Shortness of breath, follow-up echo  Primary Care Physician: [name], MD  HPI:  Douglas Hansen is a pleasant 74 year old male who is currently referred to me by Dr. Maudie Mercury. His past Douglas Hansen history is extensive and includes the following: Hypertension, dyslipidemia, type 2 diabetes, COPD, obstructive sleep apnea on CPAP, GERD, mild PAD, and significant allergic symptoms. He was previously followed by Dr. Einar Gip and has had 2 cardiac catheterizations in his past. His last heart catheterization was in 2004 which showed no significant coronary disease. He had a stress test in February 2014 which was negative for ischemia and EF of 68%. He also had ABIs for leg pain which were 0.9 on the right and 0.94 on the left. He was started on Pletal but had some shortness of breath and abnormal symptoms on it and this was discontinued. His last echocardiogram was in March 2014 showed an EF of 52% with mild to moderate right atrial enlargement, dilated IVC and elevated right atrial pressure. Recently had an upper respiratory infection concerning for COPD exacerbation. He was started on Avelox which she completed. At the end of treatment he felt some improvement in his breathing but then started to have shortness of breath again and cough. He was switched to azithromycin and placed on a prednisone Dosepak. He was also started on low-dose Lasix for chest x-ray findings of some fluid in the right fissure. Douglas Hansen says that he gained a few pounds in weight with this shortness of breath and noted that he's been on Lasix now for 10 days and that he's lost some weight and feels less fullness in his belly and in his chest. He denied any ankle edema. His shortness of breath is improving. He also denies any chest pain or anginal symptoms. He had a mild amount of orthopnea which is also resolving.   Douglas Hansen returns today for follow-up of his echocardiogram. This showed normal LV function with  mild pulmonary hypertension and elevated RVSP of 35 mmHg. This is likely secondary to COPD, but does not explain his shortness of breath. He is currently on Lasix 20 mg daily has not noted a significant improvement in his shortness of breath.  06/02/2016  Douglas Hansen was seen today in follow-up. Overall he seems to be doing well. Blood pressure initially was elevated 162/84. Weight is down from 196-185 pounds. He denies any chest pain. He is short of breath with exertion however it is difficult to tell whether this is related to COPD or not. He has reported some pain in both legs when walking. He says it seems to come on after walking certain distances and gets better at rest, reminiscent of possible claudication.  09/14/2016  Douglas Hansen returns today for follow-up. He still has concerns about bilateral leg pain with walking. He underwent ABIs and lower extremity arterial Dopplers which were normal. He also underwent a nuclear stress test because worsening shortness of breath and was found to have no evidence of ischemia with normal LV function. He's required increasing use of his inhalers recently and I suspect his symptoms may be related to COPD.  12/09/2017  Douglas Hansen is seen today for routine follow-up.  Overall he seems to be doing well.  He says his shortness of breath is been well controlled on inhalers.  Blood pressure is at goal today 128/84.  EKG shows sinus rhythm at 71 with stable inferior and anterior Q waves.  He denies any new chest  pain or new significant cardiac symptoms.  His only other major concern today is over the past 1 to 2 months he has had significant, drenching night sweats.  There is been no associated weight loss or change in appetite, but he is somewhat disturbed by this.  Labs from January 2019 showed total cholesterol 107, HDL 44, LDL 42 and triglycerides 107.  Hemoglobin A1c was 6.2.  04/07/2019  Douglas Hansen returns today for follow-up.  He was referred back by his PCP for  shortness of breath.  He reports possibly a slightly worsening shortness of breath when walking.  He does have COPD on inhalers.  An echo was performed 3 years ago which showed some mild pulmonary hypertension with RVSP 35 mmHg.  He had stress testing in 2018 which showed no ischemia.  He had a heart cath in 2004 which showed no coronary disease.  06/07/2019  Douglas Hansen returns today for follow-up of his echo.  He reports some persistent shortness of breath.  EKG today is normal.  In February 2021 showed 60 to 65% with moderate LVH and grade 1 diastolic dysfunction.  LV filling pressures were elevated.  There was mildly elevated pulmonary artery pressure.  Findings suggestive of some volume overload.  I discussed the findings with him today.  He noted that there was 1 day where he normally takes 20 mg of furosemide, however he cannot remember if he took the medicine and therefore took an additional medicine.  Later he realized he had taken it however after taking 2 doses he noted a significant improvement in his breathing.  PMHx:  Past Medical History:  Diagnosis Date  . Agent orange exposure   . Arthritis   . Asthma   . COPD (chronic obstructive pulmonary disease) ([place])   . Diabetes mellitus   . Hyperlipemia   . Hypertension   . OSA on CPAP   . Pneumonia   . PTSD (post-traumatic stress disorder)     Past Surgical History:  Procedure Laterality Date  . CARDIAC CATHETERIZATION    . right thumb surgery    . TONSILLECTOMY      FAMHx:  Family History  Problem Relation Age of Onset  . Emphysema Father   . Asthma Father   . Heart disease Father   . Heart attack Father   . Stroke Mother   . Heart attack Brother   . Diabetes Brother   . Heart attack Brother   . Diabetes Brother   . Colon cancer Neg Hx     SOCHx:   reports that he quit smoking about 34 years ago. His smoking use included cigarettes. He has a 20.00 pack-year smoking history. He has never used smokeless tobacco. He  reports that he does not drink alcohol or use drugs.  ALLERGIES:  Allergies  Allergen Reactions  . Codeine Other (See Comments)    REACTION: GI upset  . Lipitor [Atorvastatin]   . Pravachol [Pravastatin Sodium]     ROS: Pertinent items noted in HPI and remainder of comprehensive ROS otherwise negative.  HOME MEDS: Current Outpatient Medications  Medication Sig Dispense Refill  . Albuterol Sulfate 108 (90 Base) MCG/ACT AEPB Inhale 2 puffs into the lungs every 4 (four) hours. 1 each 1  . amLODipine-olmesartan (AZOR) 10-40 MG per tablet Take 1 tablet by mouth daily.      . Ascorbic Acid (VITAMIN C) 1000 MG tablet Take 1,000 mg by mouth daily. Reported on 06/17/2015    . aspirin EC 325 MG  tablet Take 325 mg by mouth daily.     Marland Kitchen azelastine (ASTELIN) 0.1 % nasal spray Place 1-2 sprays into both nostrils 2 (two) times daily. 30 mL 5  . budesonide-formoterol (SYMBICORT) 160-4.5 MCG/ACT inhaler Inhale 2 puffs into the lungs 2 (two) times daily. 1 Inhaler 5  . carvedilol (COREG) 3.125 MG tablet Take 3.125 mg by mouth 2 (two) times daily.   1  . Cellulose Carmellose Sodium POWD Apply 1 application topically daily as needed (irritation). Apply to feet    . Cholecalciferol (VITAMIN D-1000 MAX ST) 1000 UNITS tablet Take 1,000 Units by mouth daily. Reported on 06/17/2015    . clindamycin-benzoyl peroxide (BENZACLIN) gel Apply 1 application topically daily at 3 pm.     . clobetasol (TEMOVATE) 0.05 % cream Apply 1 application topically daily as needed (hands, arms, neck, thighs).     . clotrimazole (LOTRIMIN) 1 % cream Apply 1 application topically daily as needed (s). For skin condition    . Diclofenac Sodium 1.5 % SOLN Apply 12 drops topically 2 (two) times a week. Uses 12-14 drops on scalp as needed for dryness  0  . furosemide (LASIX) 20 MG tablet Take 1 tablet (20 mg total) by mouth daily. 90 tablet 3  . ketoconazole (NIZORAL) 2 % cream Apply 1 application topically daily.     . metFORMIN  (GLUCOPHAGE) 500 MG tablet Take 1,000 mg by mouth Twice daily.     . methocarbamol (ROBAXIN) 500 MG tablet Take 1 tablet (500 mg total) by mouth 2 (two) times daily. 20 tablet 0  . Multiple Vitamins-Minerals (MULTIVITAMIN & MINERAL PO) Take 1 tablet by mouth daily. Reported on 06/17/2015    . Respiratory Therapy Supplies (FLUTTER) DEVI Use as directed 1 each 0  . rosuvastatin (CRESTOR) 10 MG tablet Take 10 mg by mouth daily.    . Sertraline HCl (ZOLOFT PO) Take by mouth.    . tiotropium (SPIRIVA) 18 MCG inhalation capsule Place 1 capsule (18 mcg total) into inhaler and inhale daily. 30 capsule 3  . triamcinolone (KENALOG) 0.1 % cream Apply topically 2 (two) times daily. Reported on 06/17/2015    . amLODipine (NORVASC) 10 MG tablet Take 10 mg by mouth daily.    Marland Kitchen ascorbic acid (VITAMIN C) 500 MG tablet     . doxazosin (CARDURA) 1 MG tablet Take 1 mg by mouth daily.    Alfonse Flavors 2 MG/0.05ML SOLN     . guaifenesin (HUMIBID E) 400 MG TABS tablet     . montelukast (SINGULAIR) 10 MG tablet Take 10 mg by mouth daily.    . sertraline (ZOLOFT) 100 MG tablet Take 100 mg by mouth daily.     No current facility-administered medications for this visit.    LABS/IMAGING: No results found for this or any previous visit (from the past 48 hour(s)). No results found.  WEIGHTS: Wt Readings from Last 3 Encounters:  06/07/19 197 lb 12.8 oz (89.7 kg)  04/07/19 195 lb (88.5 kg)  12/09/17 182 lb 12.8 oz (82.9 kg)    VITALS: BP 138/76   Pulse 66   Temp (!) 96.3 F (35.7 C)   Ht 5\' 6"  (1.676 m)   Wt 197 lb 12.8 oz (89.7 kg)   SpO2 93%   BMI 31.93 kg/m   EXAM: Deferred  EKG: Normal sinus rhythm 61-personally reviewed  ASSESSMENT: 1. Dyspnea on exertion - normal LVEF 65-70%, DD, mild pulm HTN, low risk Myoview-negative for ischemia (06/2016) 2. Leg pain concerning for claudication-  history of mildly reduced ABIs 3. COPD with acute exacerbation 4. Hypertension 5. Dyslipidemia 6. OSA on  CPAP 7. Type 2 diabetes  PLAN: 1.   Douglas Hansen has grade 1 diastolic dysfunction with elevated filling pressures on his echo.  He is on low-dose Lasix.  He noted some improvement in breathing by doubling his dose accidentally 1 day.  I think it would make sense for [place] to trial him on 40 mg Lasix daily to see if this improves his breathing.  He does have COPD and other risk factors for shortness of breath, however this may be a question of volume overload.  Pixie Casino, MD, [facility], [place] Director of the Advanced Lipid Disorders &  Cardiovascular Risk Reduction Clinic Diplomate of the American Board of Clinical Lipidology Attending Cardiologist  Direct Dial: [phone]  Fax: [phone]  Website:  [email or web address] 06/07/2019, 2:55 PM

## 2019-06-07 NOTE — Patient Instructions (Signed)
Medication Instructions:  INCREASE lasix to 40mg  daily  *If you need a refill on your cardiac medications before your next appointment, please call your pharmacy*    Follow-Up: At [facility], you and your health needs are our priority.  As part of our continuing mission to provide you with exceptional heart care, we have created designated Provider Care Teams.  These Care Teams include your primary Cardiologist (physician) and Advanced Practice Providers (APPs -  Physician Assistants and Nurse Practitioners) who all work together to provide you with the care you need, when you need it.  We recommend signing up for the patient portal called "MyChart".  Sign up information is provided on this After Visit Summary.  MyChart is used to connect with patients for Virtual Visits (Telemedicine).  Patients are able to view lab/test results, encounter notes, upcoming appointments, etc.  Non-urgent messages can be sent to your provider as well.   To learn more about what you can do with MyChart, go to [email or web address].    Your next appointment:   6 month(s)  The format for your next appointment:   In Person  Provider:   You may see Dr. Debara Pickett or one of the following Advanced Practice Providers on your designated Care Team:    Almyra Deforest, PA-C  Fabian Sharp, [place] or   Roby Lofts, [place]    Other Instructions

## 2019-06-08 ENCOUNTER — Encounter: Payer: Self-pay | Admitting: Internal Medicine

## 2019-06-21 DIAGNOSIS — R1310 Dysphagia, unspecified: Secondary | ICD-10-CM | POA: Diagnosis not present

## 2019-06-21 DIAGNOSIS — K2970 Gastritis, unspecified, without bleeding: Secondary | ICD-10-CM | POA: Diagnosis not present

## 2019-08-08 DIAGNOSIS — J4541 Moderate persistent asthma with (acute) exacerbation: Secondary | ICD-10-CM | POA: Diagnosis not present

## 2019-08-21 ENCOUNTER — Ambulatory Visit (INDEPENDENT_AMBULATORY_CARE_PROVIDER_SITE_OTHER): Payer: Medicare Other | Admitting: Allergy

## 2019-08-21 ENCOUNTER — Encounter: Payer: Self-pay | Admitting: Family

## 2019-08-21 ENCOUNTER — Other Ambulatory Visit: Payer: Self-pay | Admitting: Allergy

## 2019-08-21 ENCOUNTER — Ambulatory Visit: Payer: Federal, State, Local not specified - PPO | Admitting: Family

## 2019-08-21 ENCOUNTER — Other Ambulatory Visit: Payer: Self-pay

## 2019-08-21 VITALS — BP 130/70 | HR 57 | Temp 98.3°F | Resp 16 | Ht 65.5 in | Wt 196.6 lb

## 2019-08-21 DIAGNOSIS — J3089 Other allergic rhinitis: Secondary | ICD-10-CM

## 2019-08-21 DIAGNOSIS — J449 Chronic obstructive pulmonary disease, unspecified: Secondary | ICD-10-CM

## 2019-08-21 DIAGNOSIS — K219 Gastro-esophageal reflux disease without esophagitis: Secondary | ICD-10-CM | POA: Diagnosis not present

## 2019-08-21 DIAGNOSIS — J4489 Other specified chronic obstructive pulmonary disease: Secondary | ICD-10-CM

## 2019-08-21 MED ORDER — ALBUTEROL SULFATE HFA 108 (90 BASE) MCG/ACT IN AERS: 2.0000 | INHALATION_SPRAY | RESPIRATORY_TRACT | 1 refills | Status: DC | PRN

## 2019-08-21 MED ORDER — SPIRIVA RESPIMAT 2.5 MCG/ACT IN AERS: INHALATION_SPRAY | RESPIRATORY_TRACT | 5 refills | Status: DC

## 2019-08-21 NOTE — Progress Notes (Signed)
Follow Up Note  RE: Douglas Hansen MRN: [id number] DOB: 07-Apr-1945 Date of Office Visit: 08/21/2019  Referring provider: [name], MD Primary care provider: [name], MD  Chief Complaint: Asthma  History of Present Illness: I had the pleasure of seeing Douglas Hansen for a follow up visit at the [facility] [place] on 08/21/2019. He is a 74 y.o. male, who is being followed for COP/asthma, allergic rhinitis, GERD. His previous allergy office visit was on 01/24/2018 with Dr. Verlin Hansen. Today is a regular follow up visit. Up to date with COVID-19 vaccine: yes  COPD/asthma Patient has been having issues with wheezing twice a year - usually in the spring and fall.  He finished zpak and oral prednisone 3 weeks ago.  Now having issues with chest tightness and dyspnea on exertion. Currently on Symbicort 155mcg 2 puffs twice a day with spacer and rinsing mouth afterwards.  Stopped Spiriva about 5-6 months ago and not sure if noticed any worsening symptoms.  Using albuterol about 4 times a week for exertion related activities. Takes Singulair at night.   Patient follows with pulmonology as well.   Other allergic rhinitis Sneezing, rhinorrhea, eyes itching. Currently taking zyrtec 10mg  once a day, Flonase 2 sprays BID. No nosebleeds. Only using azelastine nasal spray as needed.   Gastroesophageal reflux disease without esophagitis Stable.  Assessment and Plan: [name] is a 74 y.o. male with: Asthma-COPD overlap syndrome ([place]) Usually has issues with his breathing twice a year - in the spring and fall. Just finished zpak and oral prednisone 3 weeks ago but having chest tightness and dyspnea on exertion. Follows with cardiology and apparently also with pulmonology. Used to be on Spiriva and stopped 5-6 months ago. Using albuterol 4 times per week due to exertion related symptoms.  ACT score 10.  Today's spirometry showed: mixed obstructive and restrictive disease with 18%  improvement in FEV1 post bronchodilator treatment.  Daily controller medication(s): continue with Symbicort 165mcg 2 puffs twice a day with spacer and rinse mouth afterwards. Restart Spiriva 2.29mcg 2 puffs once a day. Sample given and demonstrated proper use.  Continue with Singulair 10mg  daily at night.  May use albuterol rescue inhaler 2 puffs every 4 to 6 hours as needed for shortness of breath, chest tightness, coughing, and wheezing. May use albuterol rescue inhaler 2 puffs 5 to 15 minutes prior to strenuous physical activities. Monitor frequency of use.  Repeat spirometry at next visit.   Other allergic rhinitis Some mild rhino conjunctivitis symptoms. No recent allergy testing.  Continue appropriate allergen avoidance measures  Continue montelukast 10 mg daily at bedtime  May use azelastine nasal spray 1-2 sprays per nostril twice a day as needed for drainage.  May use fluticasone nasal spray, 2 sprays per nostril ONCE a day.   Nasal saline lavage (NeilMed) has been recommended as needed and prior to medicated nasal sprays along with instructions for proper administration.  Gastroesophageal reflux disease without esophagitis Stable.  Continue appropriate reflux list on modifications and omeprazole as prescribed.  Return in about 2 months (around 10/21/2019).  Meds ordered this encounter  Medications  . Tiotropium Bromide Monohydrate (SPIRIVA RESPIMAT) 2.5 MCG/ACT AERS    Sig: Inhale 2 puffs one daily    Dispense:  4 g    Refill:  5  . albuterol (VENTOLIN HFA) 108 (90 Base) MCG/ACT inhaler    Sig: Inhale 2 puffs into the lungs every 4 (four) hours as needed for wheezing or shortness of breath (coughing  fit).    Dispense:  18 g    Refill:  1   Diagnostics: Spirometry:  Tracings reviewed. His effort: It was hard to get consistent efforts and there is a question as to whether this reflects a maximal maneuver. FVC: 1.26L FEV1: 0.85L, 39% predicted FEV1/FVC ratio:  67% Interpretation: Spirometry consistent with mixed obstructive and restrictive disease with 18% improvement in FEV1 post bronchodilator treatment.  Please see scanned spirometry results for details.  Medication List:  Current Outpatient Medications  Medication Sig Dispense Refill  . amLODipine (NORVASC) 10 MG tablet Take 10 mg by mouth daily.    Marland Kitchen amLODipine-olmesartan (AZOR) 10-40 MG per tablet Take 1 tablet by mouth daily.      . Ascorbic Acid (VITAMIN C) 1000 MG tablet Take 1,000 mg by mouth daily. Reported on 06/17/2015    . ascorbic acid (VITAMIN C) 500 MG tablet     . aspirin EC 325 MG tablet Take 325 mg by mouth daily.     Marland Kitchen azelastine (ASTELIN) 0.1 % nasal spray Place 1-2 sprays into both nostrils 2 (two) times daily. 30 mL 5  . budesonide-formoterol (SYMBICORT) 160-4.5 MCG/ACT inhaler Inhale 2 puffs into the lungs 2 (two) times daily. 1 Inhaler 5  . carvedilol (COREG) 3.125 MG tablet Take 3.125 mg by mouth 2 (two) times daily.   1  . Cellulose Carmellose Sodium POWD Apply 1 application topically daily as needed (irritation). Apply to feet    . Cholecalciferol (VITAMIN D-1000 MAX ST) 1000 UNITS tablet Take 1,000 Units by mouth daily. Reported on 06/17/2015    . clindamycin-benzoyl peroxide (BENZACLIN) gel Apply 1 application topically daily at 3 pm.     . clobetasol (TEMOVATE) 0.05 % cream Apply 1 application topically daily as needed (hands, arms, neck, thighs).     . clotrimazole (LOTRIMIN) 1 % cream Apply 1 application topically daily as needed (s). For skin condition    . Diclofenac Sodium 1.5 % SOLN Apply 12 drops topically 2 (two) times a week. Uses 12-14 drops on scalp as needed for dryness  0  . doxazosin (CARDURA) 1 MG tablet Take 1 mg by mouth daily.    . EYLEA 2 MG/0.05ML SOLN     . furosemide (LASIX) 40 MG tablet Take 1 tablet (40 mg total) by mouth daily. 90 tablet 3  . guaifenesin (HUMIBID E) 400 MG TABS tablet     . ketoconazole (NIZORAL) 2 % cream Apply 1 application  topically daily.     . metFORMIN (GLUCOPHAGE) 500 MG tablet Take 1,000 mg by mouth Twice daily.     . methocarbamol (ROBAXIN) 500 MG tablet Take 1 tablet (500 mg total) by mouth 2 (two) times daily. 20 tablet 0  . MI-ACID GAS RELIEF 80 MG chewable tablet     . montelukast (SINGULAIR) 10 MG tablet Take 10 mg by mouth daily.    . Multiple Vitamins-Minerals (MULTIVITAMIN & MINERAL PO) Take 1 tablet by mouth daily. Reported on 06/17/2015    . Respiratory Therapy Supplies (FLUTTER) DEVI Use as directed 1 each 0  . sertraline (ZOLOFT) 100 MG tablet Take 100 mg by mouth daily.    . Sertraline HCl (ZOLOFT PO) Take by mouth.    . triamcinolone (KENALOG) 0.1 % cream Apply topically 2 (two) times daily. Reported on 06/17/2015    . albuterol (VENTOLIN HFA) 108 (90 Base) MCG/ACT inhaler Inhale 2 puffs into the lungs every 4 (four) hours as needed for wheezing or shortness of breath (coughing fit). [place]  g 1  . rosuvastatin (CRESTOR) 10 MG tablet Take 10 mg by mouth daily. (Patient not taking: Reported on 08/21/2019)    . Tiotropium Bromide Monohydrate (SPIRIVA RESPIMAT) 2.5 MCG/ACT AERS Inhale 2 puffs one daily 4 g 5   No current facility-administered medications for this visit.   Allergies: Allergies  Allergen Reactions  . Codeine Other (See Comments)    REACTION: GI upset  . Lipitor [Atorvastatin]   . Pravachol [Pravastatin Sodium]    I reviewed his past medical history, social history, family history, and environmental history and no significant changes have been reported from his previous visit.  Review of Systems  Constitutional: Negative for appetite change, chills, fever and unexpected weight change.  HENT: Positive for congestion and rhinorrhea.   Eyes: Positive for itching.  Respiratory: Positive for chest tightness, shortness of breath and wheezing. Negative for cough.   Gastrointestinal: Positive for constipation. Negative for abdominal pain.  Skin: Negative for rash.  Neurological: Negative  for headaches.   Objective: BP 130/70 (BP Location: Left Arm, Patient Position: Sitting, Cuff Size: Large)   Pulse (!) 57   Temp 98.3 F (36.8 C) (Oral)   Resp 16   Ht 5' 5.5" (1.664 m)   Wt 196 lb 9.6 oz (89.2 kg)   SpO2 97%   BMI 32.22 kg/m  Body mass index is 32.22 kg/m. Physical Exam Vitals and nursing note reviewed.  Constitutional:      Appearance: Normal appearance. He is well-developed.  HENT:     Head: Normocephalic and atraumatic.     Right Ear: Tympanic membrane and external ear normal.     Left Ear: Tympanic membrane and external ear normal.     Nose: Nose normal.     Mouth/Throat:     Mouth: Mucous membranes are moist.     Pharynx: Oropharynx is clear.  Eyes:     Conjunctiva/sclera: Conjunctivae normal.  Cardiovascular:     Rate and Rhythm: Normal rate and regular rhythm.     Heart sounds: Normal heart sounds. No murmur heard.   Pulmonary:     Effort: Pulmonary effort is normal.     Breath sounds: Normal breath sounds. No wheezing, rhonchi or rales.  Musculoskeletal:     Cervical back: Neck supple.  Skin:    General: Skin is warm.     Findings: No rash.  Neurological:     Mental Status: He is alert and oriented to person, place, and time.  Psychiatric:        Behavior: Behavior normal.    Previous notes and tests were reviewed. The plan was reviewed with the patient/family, and all questions/concerned were addressed.  It was my pleasure to see Jahlani today and participate in his care. Please feel free to contact me with any questions or concerns.  Sincerely,  Rexene Alberts, DO Allergy & Immunology  [facility] office: [phone] [facility] office: [place] office: [phone]

## 2019-08-21 NOTE — Assessment & Plan Note (Signed)
Some mild rhino conjunctivitis symptoms. No recent allergy testing.  Continue appropriate allergen avoidance measures  Continue montelukast 10 mg daily at bedtime  May use azelastine nasal spray 1-2 sprays per nostril twice a day as needed for drainage.  May use fluticasone nasal spray, 2 sprays per nostril ONCE a day.   Nasal saline lavage (NeilMed) has been recommended as needed and prior to medicated nasal sprays along with instructions for proper administration.

## 2019-08-21 NOTE — Assessment & Plan Note (Signed)
Usually has issues with his breathing twice a year - in the spring and fall. Just finished zpak and oral prednisone 3 weeks ago but having chest tightness and dyspnea on exertion. Follows with cardiology and apparently also with pulmonology. Used to be on Spiriva and stopped 5-6 months ago. Using albuterol 4 times per week due to exertion related symptoms.  ACT score 10.  Today's spirometry showed: mixed obstructive and restrictive disease with 18% improvement in FEV1 post bronchodilator treatment.  Daily controller medication(s): continue with Symbicort 182mcg 2 puffs twice a day with spacer and rinse mouth afterwards. Restart Spiriva 2.35mcg 2 puffs once a day. Sample given and demonstrated proper use.  Continue with Singulair 10mg  daily at night.  May use albuterol rescue inhaler 2 puffs every 4 to 6 hours as needed for shortness of breath, chest tightness, coughing, and wheezing. May use albuterol rescue inhaler 2 puffs 5 to 15 minutes prior to strenuous physical activities. Monitor frequency of use.  Repeat spirometry at next visit.

## 2019-08-21 NOTE — Assessment & Plan Note (Signed)
Stable.  Continue appropriate reflux list on modifications and omeprazole as prescribed.

## 2019-08-21 NOTE — Patient Instructions (Addendum)
COPD/asthma Daily controller medication(s): continue with Symbicort 152mcg 2 puffs twice a day with spacer and rinse mouth afterwards. Restart Spiriva 2.67mcg 2 puffs once a day. Sample given and demonstrated proper use.  Continue with Singulair 10mg  daily at night.  May use albuterol rescue inhaler 2 puffs every 4 to 6 hours as needed for shortness of breath, chest tightness, coughing, and wheezing. May use albuterol rescue inhaler 2 puffs 5 to 15 minutes prior to strenuous physical activities. Monitor frequency of use.  Asthma control goals:  Full participation in all desired activities (may need albuterol before activity) Albuterol use two times or less a week on average (not counting use with activity) Cough interfering with sleep two times or less a month Oral steroids no more than once a year No hospitalizations  Other allergic rhinitis  Continue appropriate allergen avoidance measures  Continue montelukast 10 mg daily at bedtime  May use azelastine nasal spray 1-2 sprays per nostril twice a day as needed for drainage  May use fluticasone nasal spray, 2 sprays per nostril ONCE a day.   Nasal saline lavage (NeilMed) has been recommended as needed and prior to medicated nasal sprays along with instructions for proper administration.  Gastroesophageal reflux disease without esophagitis  Continue appropriate reflux list on modifications and omeprazole as prescribed.  Follow up in 2 months or sooner if needed.

## 2019-09-25 DIAGNOSIS — I10 Essential (primary) hypertension: Secondary | ICD-10-CM | POA: Diagnosis not present

## 2019-09-25 DIAGNOSIS — E118 Type 2 diabetes mellitus with unspecified complications: Secondary | ICD-10-CM | POA: Diagnosis not present

## 2019-10-02 DIAGNOSIS — J4521 Mild intermittent asthma with (acute) exacerbation: Secondary | ICD-10-CM | POA: Diagnosis not present

## 2019-10-02 DIAGNOSIS — R05 Cough: Secondary | ICD-10-CM | POA: Diagnosis not present

## 2019-10-02 DIAGNOSIS — J0101 Acute recurrent maxillary sinusitis: Secondary | ICD-10-CM | POA: Diagnosis not present

## 2019-10-10 ENCOUNTER — Ambulatory Visit
Admission: EM | Admit: 2019-10-10 | Discharge: 2019-10-10 | Disposition: A | Discharge status: Home / Self Care | Payer: Medicare Other | Attending: Physician Assistant | Admitting: Physician Assistant

## 2019-10-10 ENCOUNTER — Other Ambulatory Visit: Payer: Self-pay

## 2019-10-10 DIAGNOSIS — J441 Chronic obstructive pulmonary disease with (acute) exacerbation: Secondary | ICD-10-CM

## 2019-10-10 MED ORDER — DEXAMETHASONE SODIUM PHOSPHATE 10 MG/ML IJ SOLN
10.0000 mg | Freq: Once | INTRAMUSCULAR | Status: AC
  Administered 2019-10-10: 10 mg via INTRAMUSCULAR

## 2019-10-10 MED ORDER — ALBUTEROL SULFATE HFA 108 (90 BASE) MCG/ACT IN AERS
2.0000 | INHALATION_SPRAY | Freq: Once | RESPIRATORY_TRACT | Status: AC
  Administered 2019-10-10: 2 via RESPIRATORY_TRACT

## 2019-10-10 MED ORDER — PREDNISONE 50 MG PO TABS: 50.0000 mg | ORAL_TABLET | Freq: Every day | ORAL | 0 refills | Status: DC

## 2019-10-10 NOTE — ED Triage Notes (Signed)
Pt c/o productive cough with grey sputum, wheezing, and SOB on exertion since last night.

## 2019-10-10 NOTE — Discharge Instructions (Signed)
Decadron injection in office today.  Before bedtime today, use albuterol 4 puffs.  Tomorrow, start prednisone, and return to 2 puffs every 4 hours as needed for albuterol.  Please contact your primary care to let them know about her current symptoms.  If having more mucus production, please give [place] a call, and we may need to start you on antibiotics.  Please follow-up with PCP for further evaluation if symptoms not improving.  If significant worsening of symptoms, chest pain, unable to breathe despite using your inhalers and prednisone, go to the emergency department for further evaluation.

## 2019-10-10 NOTE — ED Provider Notes (Signed)
[facility]    CSN: [id number] Arrival date & time: 10/10/19  1855      History   Chief Complaint Chief Complaint  Patient presents with   Shortness of Breath    HPI Douglas Hansen is a 74 y.o. male.   74 year old male comes in for URI symptoms started last night. Has had productive cough, wheezing, shortness of breath. Denies fever. Has had some rhinorrhea, nasal congestion. Denies loss of taste/smell. COVID vaccinated. Have been using his inhalers with temporary relief.      Past Medical History:  Diagnosis Date   Agent orange exposure    Arthritis    Asthma    COPD (chronic obstructive pulmonary disease) ([place])    Diabetes mellitus    Hyperlipemia    Hypertension    OSA on CPAP    Pneumonia    PTSD (post-traumatic stress disorder)     Patient Active Problem List   Diagnosis Date Noted   Dyslipidemia 06/03/2016   Other fatigue 06/03/2016   Claudication ([place]) 06/03/2016   Acute sinusitis 12/16/2015   Asthma with acute exacerbation 06/17/2015   Acute bronchitis 06/17/2015   Gastroesophageal reflux disease without esophagitis 06/17/2015   Shortness of breath 04/17/2015   Acute pulmonary edema ([place]) 04/17/2015   Chronic obstructive pulmonary disease, unspecified copd, unspecified chronic bronchitis type 01/07/2015   Moderate persistent asthma 01/07/2015   Other allergic rhinitis 01/07/2015   Obstructive sleep apnea treated with continuous positive airway pressure (CPAP) 01/07/2015   Asthma-COPD overlap syndrome (HCC)     Class: Chronic   Pneumonia due to other gram-negative bacteria ([place]) 01/30/2011   Cough 06/09/2010   COPD (chronic obstructive pulmonary disease) ([place]) 06/09/2010   OSA on CPAP    Diabetes mellitus    Hyperlipemia    Essential hypertension     Past Surgical History:  Procedure Laterality Date   CARDIAC CATHETERIZATION     right thumb surgery     TONSILLECTOMY         Home Medications     Prior to Admission medications   Medication Sig Start Date End Date Taking? Authorizing Provider  albuterol (VENTOLIN HFA) 108 (90 Base) MCG/ACT inhaler INHALE 2 PUFFS BY MOUTH EVERY 4 HOURS AS NEEDED FOR WHEEZING OR SHORTNESS OF BREATH 08/22/19   Garnet Sierras, DO  amLODipine (NORVASC) 10 MG tablet Take 10 mg by mouth daily. 04/06/19   [provider]  amLODipine-olmesartan (AZOR) 10-40 MG per tablet Take 1 tablet by mouth daily.      [provider]  Ascorbic Acid (VITAMIN C) 1000 MG tablet Take 1,000 mg by mouth daily. Reported on 06/17/2015    [provider]  ascorbic acid (VITAMIN C) 500 MG tablet  04/06/19   [provider]  aspirin EC 325 MG tablet Take 325 mg by mouth daily.     [provider]  azelastine (ASTELIN) 0.1 % nasal spray Place 1-2 sprays into both nostrils 2 (two) times daily. 01/24/18   Bobbitt, Sedalia Muta, MD  budesonide-formoterol (SYMBICORT) 160-4.5 MCG/ACT inhaler Inhale 2 puffs into the lungs 2 (two) times daily. 01/24/18   Bobbitt, Sedalia Muta, MD  carvedilol (COREG) 3.125 MG tablet Take 3.125 mg by mouth 2 (two) times daily.  11/29/14   [provider]  Cellulose Carmellose Sodium POWD Apply 1 application topically daily as needed (irritation). Apply to feet    [provider]  Cholecalciferol (VITAMIN D-1000 MAX ST) 1000 UNITS tablet Take 1,000 Units by mouth  daily. Reported on 06/17/2015    [provider]  clindamycin-benzoyl peroxide (BENZACLIN) gel Apply 1 application topically daily at 3 pm.     [provider]  clobetasol (TEMOVATE) 0.05 % cream Apply 1 application topically daily as needed (hands, arms, neck, thighs).     [provider]  clotrimazole (LOTRIMIN) 1 % cream Apply 1 application topically daily as needed (s). For skin condition    [provider]  Diclofenac Sodium 1.5 % SOLN Apply 12 drops topically 2 (two) times a week. Uses 12-14 drops on scalp as  needed for dryness 10/17/14   [provider]  doxazosin (CARDURA) 1 MG tablet Take 1 mg by mouth daily. 04/03/19   [provider]  EYLEA 2 MG/0.05ML SOLN  04/17/19   [provider]  furosemide (LASIX) 40 MG tablet Take 1 tablet (40 mg total) by mouth daily. 06/07/19   Hilty, Nadean Corwin, MD  guaifenesin (HUMIBID E) 400 MG TABS tablet  04/05/19   [provider]  ketoconazole (NIZORAL) 2 % cream Apply 1 application topically daily.     [provider]  metFORMIN (GLUCOPHAGE) 500 MG tablet Take 1,000 mg by mouth Twice daily.  07/28/10   [provider]  methocarbamol (ROBAXIN) 500 MG tablet Take 1 tablet (500 mg total) by mouth 2 (two) times daily. 10/12/17   Raylene Everts, MD  MI-ACID GAS RELIEF 80 MG chewable tablet  04/06/19   [provider]  montelukast (SINGULAIR) 10 MG tablet Take 10 mg by mouth daily. 04/06/19   [provider]  Multiple Vitamins-Minerals (MULTIVITAMIN & MINERAL PO) Take 1 tablet by mouth daily. Reported on 06/17/2015    [provider]  predniSONE (DELTASONE) 50 MG tablet Take 1 tablet (50 mg total) by mouth daily with breakfast. 10/10/19   Ok Edwards, PA-C  Respiratory Therapy Supplies (FLUTTER) DEVI Use as directed 04/20/16   Bobbitt, Sedalia Muta, MD  rosuvastatin (CRESTOR) 10 MG tablet Take 10 mg by mouth daily. Patient not taking: Reported on 08/21/2019    [provider]  sertraline (ZOLOFT) 100 MG tablet Take 100 mg by mouth daily. 04/05/19   [provider]  Sertraline HCl (ZOLOFT PO) Take by mouth.    [provider]  Tiotropium Bromide Monohydrate (SPIRIVA RESPIMAT) 2.5 MCG/ACT AERS Inhale 2 puffs one daily 08/21/19   Garnet Sierras, DO  triamcinolone (KENALOG) 0.1 % cream Apply topically 2 (two) times daily. Reported on 06/17/2015    [provider]    Family History Family History  Problem Relation Age of Onset   Emphysema Father    Asthma Father     Heart disease Father    Heart attack Father    Stroke Mother    Heart attack Brother    Diabetes Brother    Heart attack Brother    Diabetes Brother    Colon cancer Neg Hx     Social History Social History   Tobacco Use   Smoking status: Former Smoker    Packs/day: 1.00    Years: 20.00    Pack years: 20.00    Types: Cigarettes    Quit date: 03/09/1985    Years since quitting: 34.6   Smokeless tobacco: Never Used  Vaping Use   Vaping Use: Never used  Substance Use Topics   Alcohol use: No   Drug use: No     Allergies   Codeine, Lipitor [atorvastatin], and Pravachol [pravastatin sodium]   Review of Systems  Review of Systems  Reason unable to perform ROS: See HPI as above.     Physical Exam Triage Vital Signs ED Triage Vitals [10/10/19 1919]  Enc Vitals Group     BP (!) 159/85     Pulse Rate 80     Resp 18     Temp 98.1 F (36.7 C)     Temp Source Oral     SpO2 92 %     Weight      Height      Head Circumference      Peak Flow      Pain Score 0     Pain Loc      Pain Edu?      Excl. in [place]?    No data found.  Updated Vital Signs BP (!) 159/85 (BP Location: Left Arm)    Pulse 80    Temp 98.1 F (36.7 C) (Oral)    Resp 18    SpO2 96%   Physical Exam Constitutional:      General: He is not in acute distress.    Appearance: Normal appearance. He is well-developed. He is not toxic-appearing or diaphoretic.  HENT:     Head: Normocephalic and atraumatic.  Eyes:     Conjunctiva/sclera: Conjunctivae normal.     Pupils: Pupils are equal, round, and reactive to light.  Cardiovascular:     Rate and Rhythm: Normal rate and regular rhythm.  Pulmonary:     Effort: Pulmonary effort is normal. No respiratory distress.     Comments: Speaking in full sentences without difficulty. However, audible wheezing. Normal effort.   Lungs with decreased air movement. Occasional wheezing.  Albuterol 4 puffs x 1: better air movement. Distant lung sounds.  Inspiratory and expiratory wheezing to the upper fields bilaterally without rhonchi, rales. O2 sat 94%  Albuterol 4 puffs x 2: Improved fluid movement.  Patient with improvement of symptoms as well.  Lungs clear to auscultation bilaterally without adventitious lung sounds.  Still with distant lung sounds.  O2 sat 96%. Musculoskeletal:     Cervical back: Normal range of motion and neck supple.  Skin:    General: Skin is warm and dry.  Neurological:     Mental Status: He is alert and oriented to person, place, and time.      UC Treatments / Results  Labs (all labs ordered are listed, but only abnormal results are displayed) Labs Reviewed - No data to display  EKG   Radiology No results found.  Procedures Procedures (including critical care time)  Medications Ordered in UC Medications  albuterol (VENTOLIN HFA) 108 (90 Base) MCG/ACT inhaler 2 puff (2 puffs Inhalation Given 10/10/19 2029)  dexamethasone (DECADRON) injection 10 mg (10 mg Intramuscular Given 10/10/19 2035)    Initial Impression / Assessment and Plan / UC Course  I have reviewed the triage vital signs and the nursing notes.  Pertinent labs & imaging results that were available during my care of the patient were reviewed by me and considered in my medical decision making (see chart for details).    74 year old male with history of COPD comes in for 2-day history of cough, wheezing, shortness of breath.  Mild increase in mucus production.  Denies fever.  Covid vaccinated.  On exam, had decreased air movement with mild wheezing.  Albuterol 4 puffs x 2 with lungs clear to auscultation bilaterally without adventitious lung sounds.  Currently, do not feel the need for antibiotics.  Will provide Decadron injection and  course of prednisone at home.  Continue inhalers as directed.  Discussed of increased mucus production in the next few days, to call office, at that time, will add doxycycline 100 mg twice daily x7 days.   Otherwise patient to follow-up with PCP for further evaluation.  Return precautions given.  Patient expresses understanding and agrees to plan.  Final Clinical Impressions(s) / UC Diagnoses   Final diagnoses:  COPD exacerbation [facility])    ED Prescriptions    Medication Sig Dispense Auth. Provider   predniSONE (DELTASONE) 50 MG tablet Take 1 tablet (50 mg total) by mouth daily with breakfast. 5 tablet Ok Edwards, PA-C     PDMP not reviewed this encounter.   Ok Edwards, PA-C 10/11/19 [phone]

## 2019-10-24 NOTE — Progress Notes (Signed)
Follow Up Note  RE: Douglas Hansen MRN: [id number] DOB: 1945-03-16 Date of Office Visit: 10/25/2019  Referring provider: [name], MD Primary care provider: [name], MD  Chief Complaint: Asthma (Asthma is not well. He is having wheezing and coughing. Hew wen to Urgent Care. The gave him medication that made it calm down. Now he fels that the pollen is triggering his asthma more.)  History of Present Illness: I had the pleasure of seeing Douglas Hansen for a follow up visit at the [facility] on 10/25/2019. He is a 74 y.o. male, who is being followed for asthma/COPD, allergic rhinitis and GERD. His previous allergy office visit was on 08/21/2019 with Dr. Maudie Mercury. Today is a regular follow up visit. Up to date with COVID-19 vaccine: yes  Asthma-COPD overlap syndrome ([place]) Patient has been having issues with his breathing for the past 3-4 weeks with wheezing, coughing, shortness of breath.  Urgent Care visit on 10/10/2019 for COPD exacerbation. He finished prednisone with some benefit.  Denies any URI symptoms, fevers, chills, sick contacts.   Currently on Symbicort 131mcg 2 puffs twice a day and Spiriva 2 puffs every 4-6 hours and using albuterol 2 puffs BID with some benefit.  Other allergic rhinitis Having some PND and rhinorrhea. Takes montelukast 10mg  daily and zyrtec 10mg  daily. Using Flonase 2 sprays twice a day and azelastine 2 sprays twice a day. With no nosebleeds.  Gastroesophageal reflux disease without esophagitis Takes omeprazole with good benefit.   Assessment and Plan: [name] is a 74 y.o. male with: Asthma-COPD overlap syndrome (HCC) Past history - Usually has issues with his breathing twice a year - in the spring and fall. Follows with cardiology and apparently also with pulmonology. Ex-smoker. Interim history - issues with his breathing the last 3-4 weeks. Went to UC on 8/3 and given prednisone with some benefit. Denies URI symptoms.   Today's  spirometry showed: severe restriction with no improvement in FEV1 post bronchodilator treatment. Slight improvement clinically and better air improvement on exam.  If you get fevers, discolored sputum or worsening symptoms let us know.  Get bloodwork to see if qualifies for asthma biologics as he had multiple flares with prednisone courses within the past 12 months.  Start:  Prednisone 10mg  tablet pack: 2 tablets given in office today. Take 2 more tablets before bed today.  Then take 2 tablets twice a day for 2 more days. Then take 2 tablets once a day for 1 day. Then take 1 tablet once a day for 1 day.  Daily controller medication(s): continue with Symbicort 136mcg 2 puffs twice a day with spacer and rinse mouth afterwards. START Alvesco 26mcg 2 puffs twice a day with spacer and rinse mouth afterwards. Sample given. If this helps your breathing let me know and will send in a prescription. ONLY use Spiriva 2.40mcg 2 puffs ONCE a day.  May combine the Symbicort and Spiriva and switch to [place] if insurance covers.  Continue with Singulair 10mg  daily at night.  May use albuterol rescue inhaler 2 puffs every 4 to 6 hours as needed for shortness of breath, chest tightness, coughing, and wheezing. May use albuterol rescue inhaler 2 puffs 5 to 15 minutes prior to strenuous physical activities. Monitor frequency of use.  Repeat spirometry at next visit.   Other allergic rhinitis Past history - Some mild rhino conjunctivitis symptoms. No recent allergy testing. Interim history - still has PND and rhinorrhea.   Continue appropriate allergen avoidance measures  Continue montelukast 10 mg daily at bedtime  May use azelastine nasal spray 1-2 sprays per nostril twice a day as needed for drainage.  May use fluticasone nasal spray, 2 sprays per nostril ONCE a day.   Nasal saline lavage (NeilMed) has been recommended as needed and prior to medicated nasal sprays along with instructions for proper  administration.  Check environmental allergy panel via bloodwork today.   Gastroesophageal reflux disease without esophagitis Stable.  Continue omeprazole as prescribed.  Return in about 2 months (around 12/25/2019).  Lab Orders     Allergens w/Total IgE Area 2     CBC with Differential/Platelet  Diagnostics: Spirometry:  Tracings reviewed. His effort: Good reproducible efforts. FVC: 1.25L FEV1: 0.88L, 38% predicted FEV1/FVC ratio: 70% Interpretation: severe restriction with no improvement in FEV1 post bronchodilator treatment. Slight improvement clinically. Better air improvement on exam.  Please see scanned spirometry results for details.  Medication List:  Current Outpatient Medications  Medication Sig Dispense Refill  . albuterol (VENTOLIN HFA) 108 (90 Base) MCG/ACT inhaler INHALE 2 PUFFS BY MOUTH EVERY 4 HOURS AS NEEDED FOR WHEEZING OR SHORTNESS OF BREATH 42.5 g 0  . amLODipine (NORVASC) 10 MG tablet Take 10 mg by mouth daily.    Marland Kitchen amLODipine-olmesartan (AZOR) 10-40 MG per tablet Take 1 tablet by mouth daily.      . Ascorbic Acid (VITAMIN C) 1000 MG tablet Take 1,000 mg by mouth daily. Reported on 06/17/2015    . aspirin EC 325 MG tablet Take 325 mg by mouth daily.     Marland Kitchen azelastine (ASTELIN) 0.1 % nasal spray Place 1-2 sprays into both nostrils 2 (two) times daily. 30 mL 5  . benzonatate (TESSALON) 200 MG capsule Take 200 mg by mouth 3 (three) times daily.    . budesonide-formoterol (SYMBICORT) 160-4.5 MCG/ACT inhaler Inhale 2 puffs into the lungs 2 (two) times daily. 1 Inhaler 5  . carvedilol (COREG) 3.125 MG tablet Take 3.125 mg by mouth 2 (two) times daily.   1  . Cellulose Carmellose Sodium POWD Apply 1 application topically daily as needed (irritation). Apply to feet    . Cholecalciferol (VITAMIN D-1000 MAX ST) 1000 UNITS tablet Take 1,000 Units by mouth daily. Reported on 06/17/2015    . clindamycin-benzoyl peroxide (BENZACLIN) gel Apply 1 application topically daily at  3 pm.     . clobetasol (TEMOVATE) 0.05 % cream Apply 1 application topically daily as needed (hands, arms, neck, thighs).     . clotrimazole (LOTRIMIN) 1 % cream Apply 1 application topically daily as needed (s). For skin condition    . Diclofenac Sodium 1.5 % SOLN Apply 12 drops topically 2 (two) times a week. Uses 12-14 drops on scalp as needed for dryness  0  . doxazosin (CARDURA) 1 MG tablet Take 1 mg by mouth daily.    . EYLEA 2 MG/0.05ML SOLN     . furosemide (LASIX) 40 MG tablet Take 1 tablet (40 mg total) by mouth daily. 90 tablet 3  . ketoconazole (NIZORAL) 2 % cream Apply 1 application topically daily.     . metFORMIN (GLUCOPHAGE) 500 MG tablet Take 1,000 mg by mouth Twice daily.     . methocarbamol (ROBAXIN) 500 MG tablet Take 1 tablet (500 mg total) by mouth 2 (two) times daily. 20 tablet 0  . montelukast (SINGULAIR) 10 MG tablet Take 10 mg by mouth daily.    . Multiple Vitamins-Minerals (MULTIVITAMIN & MINERAL PO) Take 1 tablet by mouth daily. Reported on 06/17/2015    .  OneTouch Delica Lancets 14E MISC daily.    Glory Rosebush ULTRA test strip SMARTSIG:Via Meter Once PRN    . predniSONE (DELTASONE) 50 MG tablet Take 1 tablet (50 mg total) by mouth daily with breakfast. 5 tablet 0  . Respiratory Therapy Supplies (FLUTTER) DEVI Use as directed 1 each 0  . sertraline (ZOLOFT) 100 MG tablet Take 100 mg by mouth daily.    . Tiotropium Bromide Monohydrate (SPIRIVA RESPIMAT) 2.5 MCG/ACT AERS Inhale 2 puffs one daily 4 g 5  . triamcinolone (KENALOG) 0.1 % cream Apply topically 2 (two) times daily. Reported on 06/17/2015    . ascorbic acid (VITAMIN C) 500 MG tablet  (Patient not taking: Reported on 10/25/2019)    . guaifenesin (HUMIBID E) 400 MG TABS tablet  (Patient not taking: Reported on 10/25/2019)    . MI-ACID GAS RELIEF 80 MG chewable tablet  (Patient not taking: Reported on 10/25/2019)    . rosuvastatin (CRESTOR) 10 MG tablet Take 10 mg by mouth daily. (Patient not taking: Reported on  08/21/2019)    . Sertraline HCl (ZOLOFT PO) Take by mouth.     No current facility-administered medications for this visit.   Allergies: Allergies  Allergen Reactions  . Codeine Other (See Comments)    REACTION: GI upset  . Lipitor [Atorvastatin]   . Pravachol [Pravastatin Sodium]    I reviewed his past medical history, social history, family history, and environmental history and no significant changes have been reported from his previous visit.  Review of Systems  Constitutional: Negative for appetite change, chills, fever and unexpected weight change.  HENT: Positive for congestion and rhinorrhea.   Eyes: Negative for itching.  Respiratory: Positive for chest tightness, shortness of breath and wheezing. Negative for cough.   Gastrointestinal: Negative for abdominal pain.  Skin: Negative for rash.  Neurological: Negative for headaches.   Objective: BP 130/88   Pulse 82   Temp 98.7 F (37.1 C)   Resp 16   Ht 5\' 6"  (1.676 m)   Wt 194 lb 12.8 oz (88.4 kg)   SpO2 94%   BMI 31.44 kg/m  Body mass index is 31.44 kg/m. Physical Exam Vitals and nursing note reviewed.  Constitutional:      Appearance: Normal appearance. He is well-developed.  HENT:     Head: Normocephalic and atraumatic.     Right Ear: Tympanic membrane and external ear normal.     Left Ear: Tympanic membrane and external ear normal.     Nose: Nose normal.     Mouth/Throat:     Mouth: Mucous membranes are moist.     Pharynx: Oropharynx is clear.  Eyes:     Conjunctiva/sclera: Conjunctivae normal.  Cardiovascular:     Rate and Rhythm: Normal rate and regular rhythm.     Heart sounds: Normal heart sounds. No murmur heard.   Pulmonary:     Effort: Pulmonary effort is normal.     Breath sounds: No wheezing, rhonchi or rales.     Comments: Decreased breath sounds throughout. Musculoskeletal:     Cervical back: Neck supple.  Skin:    General: Skin is warm.     Findings: No rash.  Neurological:      Mental Status: He is alert and oriented to person, place, and time.  Psychiatric:        Behavior: Behavior normal.    Previous notes and tests were reviewed. The plan was reviewed with the patient/family, and all questions/concerned were addressed.  It was my  pleasure to see Chevon today and participate in his care. Please feel free to contact me with any questions or concerns.  Sincerely,  Rexene Alberts, DO Allergy & Immunology  [facility] office: [phone] [facility] office: [place] office: [phone]

## 2019-10-25 ENCOUNTER — Other Ambulatory Visit: Payer: Self-pay

## 2019-10-25 ENCOUNTER — Ambulatory Visit (INDEPENDENT_AMBULATORY_CARE_PROVIDER_SITE_OTHER): Payer: Medicare Other | Admitting: Allergy

## 2019-10-25 ENCOUNTER — Encounter: Payer: Self-pay | Admitting: Allergy

## 2019-10-25 VITALS — BP 130/88 | HR 82 | Temp 98.7°F | Resp 16 | Ht 66.0 in | Wt 194.8 lb

## 2019-10-25 DIAGNOSIS — J3089 Other allergic rhinitis: Secondary | ICD-10-CM

## 2019-10-25 DIAGNOSIS — K219 Gastro-esophageal reflux disease without esophagitis: Secondary | ICD-10-CM | POA: Diagnosis not present

## 2019-10-25 DIAGNOSIS — J449 Chronic obstructive pulmonary disease, unspecified: Secondary | ICD-10-CM

## 2019-10-25 NOTE — Assessment & Plan Note (Addendum)
Past history - Usually has issues with his breathing twice a year - in the spring and fall. Follows with cardiology and apparently also with pulmonology. Ex-smoker. Interim history - issues with his breathing the last 3-4 weeks. Went to UC on 8/3 and given prednisone with some benefit. Denies URI symptoms.   Today's spirometry showed: severe restriction with no improvement in FEV1 post bronchodilator treatment. Slight improvement clinically and better air improvement on exam.  If you get fevers, discolored sputum or worsening symptoms let us know.  Get bloodwork to see if qualifies for asthma biologics as he had multiple flares with prednisone courses within the past 12 months.  Start:  Prednisone 10mg  tablet pack: 2 tablets given in office today. Take 2 more tablets before bed today.  Then take 2 tablets twice a day for 2 more days. Then take 2 tablets once a day for 1 day. Then take 1 tablet once a day for 1 day.  Daily controller medication(s): continue with Symbicort 128mcg 2 puffs twice a day with spacer and rinse mouth afterwards. START Alvesco 74mcg 2 puffs twice a day with spacer and rinse mouth afterwards. Sample given. If this helps your breathing let me know and will send in a prescription. ONLY use Spiriva 2.87mcg 2 puffs ONCE a day.  May combine the Symbicort and Spiriva and switch to [place] if insurance covers.  Continue with Singulair 10mg  daily at night.  May use albuterol rescue inhaler 2 puffs every 4 to 6 hours as needed for shortness of breath, chest tightness, coughing, and wheezing. May use albuterol rescue inhaler 2 puffs 5 to 15 minutes prior to strenuous physical activities. Monitor frequency of use.  Repeat spirometry at next visit.

## 2019-10-25 NOTE — Patient Instructions (Addendum)
COPD/asthma If you get fevers, discolored sputum or worsening symptoms let us know.  Get bloodwork to see if you qualify for an injectable medication for the asthma.   Start:  Prednisone 10mg  tablet pack: 2 tablets given in office today. Take 2 more tablets before bed today.  Then take 2 tablets twice a day for 2 more days. Then take 2 tablets once a day for 1 day. Then take 1 tablet once a day for 1 day.   Daily controller medication(s): continue with Symbicort 110mcg 2 puffs twice a day with spacer and rinse mouth afterwards. START Alvesco 18mcg 2 puffs twice a day with spacer and rinse mouth afterwards. Sample given. If this helps your breathing let me know and will send in a prescription.  ONLY use Spiriva 2.7mcg 2 puffs ONCE a day.   Continue with Singulair 10mg  daily at night.  May use albuterol rescue inhaler 2 puffs every 4 to 6 hours as needed for shortness of breath, chest tightness, coughing, and wheezing. May use albuterol rescue inhaler 2 puffs 5 to 15 minutes prior to strenuous physical activities. Monitor frequency of use.  Asthma control goals:  Full participation in all desired activities (may need albuterol before activity) Albuterol use two times or less a week on average (not counting use with activity) Cough interfering with sleep two times or less a month Oral steroids no more than once a year No hospitalizations  Other allergic rhinitis  Continue appropriate allergen avoidance measures  Continue montelukast 10 mg daily at bedtime.  Continue zyrtec 10mg  daily.   May use azelastine nasal spray 1-2 sprays per nostril twice a day as needed for drainage  May use fluticasone nasal spray, 1 spray per nostril twice a day.   Nasal saline lavage (NeilMed) has been recommended as needed and prior to medicated nasal sprays along with instructions for proper administration.   Gastroesophageal reflux disease without esophagitis  Continue omeprazole as  prescribed.  Follow up in 2 months or sooner if needed.

## 2019-10-25 NOTE — Assessment & Plan Note (Signed)
Stable.  Continue omeprazole as prescribed.

## 2019-10-25 NOTE — Assessment & Plan Note (Signed)
Past history - Some mild rhino conjunctivitis symptoms. No recent allergy testing. Interim history - still has PND and rhinorrhea.   Continue appropriate allergen avoidance measures  Continue montelukast 10 mg daily at bedtime  May use azelastine nasal spray 1-2 sprays per nostril twice a day as needed for drainage.  May use fluticasone nasal spray, 2 sprays per nostril ONCE a day.   Nasal saline lavage (NeilMed) has been recommended as needed and prior to medicated nasal sprays along with instructions for proper administration.  Check environmental allergy panel via bloodwork today.

## 2019-10-28 LAB — ALLERGENS W/TOTAL IGE AREA 2
Alternaria Alternata IgE: <0.1 kU/L
Aspergillus Fumigatus IgE: <0.1 kU/L
Bermuda Grass IgE: 0.79 kU/L — AB
Cat Dander IgE: <0.1 kU/L
Cedar, Mountain IgE: 0.63 kU/L — AB
Cladosporium Herbarum IgE: <0.1 kU/L
Cockroach, German IgE: <0.1 kU/L
Common Silver Birch IgE: <0.1 kU/L
Cottonwood IgE: <0.1 kU/L
D Farinae IgE: 0.96 kU/L — AB
D Pteronyssinus IgE: 0.94 kU/L — AB
Dog Dander IgE: <0.1 kU/L
Elm, American IgE: <0.1 kU/L
IgE (Immunoglobulin E), Serum: 140 IU/mL (ref 6–495)
Johnson Grass IgE: 1.88 kU/L — AB
Maple/Box Elder IgE: 0.22 kU/L — AB
Mouse Urine IgE: <0.1 kU/L
Oak, White IgE: <0.1 kU/L
Pecan, Hickory IgE: <0.1 kU/L
Penicillium Chrysogen IgE: <0.1 kU/L
Pigweed, Rough IgE: <0.1 kU/L
Ragweed, Short IgE: 0.53 kU/L — AB
Sheep Sorrel IgE Qn: <0.1 kU/L
Timothy Grass IgE: 7.63 kU/L — AB
White Mulberry IgE: <0.1 kU/L

## 2019-10-28 LAB — CBC WITH DIFFERENTIAL/PLATELET
Basophils Absolute: 0 10*3/uL (ref 0.0–0.2)
Basos: 1 %
EOS (ABSOLUTE): 0.6 10*3/uL — ABNORMAL HIGH (ref 0.0–0.4)
Eos: 8 %
Hematocrit: 48 % (ref 37.5–51.0)
Hemoglobin: 15.5 g/dL (ref 13.0–17.7)
Immature Grans (Abs): 0 10*3/uL (ref 0.0–0.1)
Immature Granulocytes: 0 %
Lymphocytes Absolute: 2.1 10*3/uL (ref 0.7–3.1)
Lymphs: 32 %
MCH: 29.5 pg (ref 26.6–33.0)
MCHC: 32.3 g/dL (ref 31.5–35.7)
MCV: 91 fL (ref 79–97)
Monocytes Absolute: 0.7 10*3/uL (ref 0.1–0.9)
Monocytes: 10 %
Neutrophils Absolute: 3.3 10*3/uL (ref 1.4–7.0)
Neutrophils: 49 %
Platelets: 188 10*3/uL (ref 150–450)
RBC: 5.26 x10E6/uL (ref 4.14–5.80)
RDW: 13.8 % (ref 11.6–15.4)
WBC: 6.7 10*3/uL (ref 3.4–10.8)

## 2019-11-08 ENCOUNTER — Telehealth: Payer: Self-pay | Admitting: *Deleted

## 2019-11-08 NOTE — Telephone Encounter (Signed)
No auth needed so will go ahead and order medication and L/m for patient to call me to schedule

## 2019-11-08 NOTE — Telephone Encounter (Signed)
L/M for patient to contact me regarding starting Nucala.  Need to determine Ins coverage first

## 2019-11-08 NOTE — Telephone Encounter (Signed)
-----   Message from Donzetta Starch, [place] sent at 11/03/2019  3:11 PM EDT ----- Regarding: Nucala New Start Dr. Maudie Mercury would like this patient started on nucala for his asthma. Thank you!

## 2019-11-08 NOTE — Telephone Encounter (Signed)
Spoke to patient and he advised MCR and FEP and will get auth and do buy and bill for patient. He does want to proceed with Nucala

## 2019-11-14 NOTE — Telephone Encounter (Signed)
L/m for patient to contact me  

## 2019-11-14 NOTE — Telephone Encounter (Signed)
Sopke to patient and appt scheduled for 9/9 to start

## 2019-11-15 DIAGNOSIS — J4550 Severe persistent asthma, uncomplicated: Secondary | ICD-10-CM

## 2019-11-16 ENCOUNTER — Other Ambulatory Visit: Payer: Self-pay

## 2019-11-16 ENCOUNTER — Ambulatory Visit (INDEPENDENT_AMBULATORY_CARE_PROVIDER_SITE_OTHER): Payer: Medicare Other

## 2019-11-16 DIAGNOSIS — J4550 Severe persistent asthma, uncomplicated: Secondary | ICD-10-CM

## 2019-11-16 MED ORDER — MEPOLIZUMAB 100 MG ~~LOC~~ SOLR
100.0000 mg | SUBCUTANEOUS | Status: DC
  Administered 2019-11-16 – 2020-04-11 (×6): 100 mg via SUBCUTANEOUS

## 2019-11-16 MED ORDER — EPINEPHRINE 0.3 MG/0.3ML IJ SOAJ
0.3000 mg | INTRAMUSCULAR | 1 refills | Status: AC | PRN
Start: 2019-11-16 — End: ?

## 2019-11-16 NOTE — Progress Notes (Signed)
Immunotherapy   Patient Details  Name: Douglas Hansen MRN: [id number] Date of Birth: 06-09-1945  11/16/2019  Douglas Hansen is starting Nucala injections today. He will receive 100mg  injections every 4 weeks.  Patient did request two Tylenol tablets as he says he always takes them after receiving any kind of injections. I did give him two tablet of regular strength tylenol, which patient says is what he takes normally.  Patient waited 30 minutes post injection with no local reaction or systemic symptoms.  Consent signed and patient instructions given.   Lonn Georgia I Andersen Iorio 11/16/2019, 1:32 PM

## 2019-11-20 ENCOUNTER — Ambulatory Visit
Admission: EM | Admit: 2019-11-20 | Discharge: 2019-11-20 | Disposition: A | Discharge status: Home / Self Care | Payer: Medicare Other | Attending: Physician Assistant | Admitting: Physician Assistant

## 2019-11-20 ENCOUNTER — Other Ambulatory Visit: Payer: Self-pay

## 2019-11-20 ENCOUNTER — Telehealth: Payer: Self-pay | Admitting: Allergy

## 2019-11-20 DIAGNOSIS — Z1152 Encounter for screening for COVID-19: Secondary | ICD-10-CM

## 2019-11-20 DIAGNOSIS — R059 Cough, unspecified: Secondary | ICD-10-CM

## 2019-11-20 DIAGNOSIS — R0602 Shortness of breath: Secondary | ICD-10-CM

## 2019-11-20 NOTE — ED Provider Notes (Signed)
[facility]    CSN: [id number] Arrival date & time: 11/20/19  1026      History   Chief Complaint Chief Complaint  Patient presents with  . Fever  . Weakness  . Fatigue    HPI Douglas Hansen is a 74 y.o. male.   74 year old male with history of COPD/asthma, DM, HLD, HTN, OSA on cpap comes in for few day of URI symptoms. Cough, DOE, weakness, fatigue. tmax 99-100. Denies loss of taste/smell. Denies leg swelling, orthopnea, chest pain. Albuterol with some relief.  Received mepolizumab injection for the first time 11/16/2019     Past Medical History:  Diagnosis Date  . Agent orange exposure   . Arthritis   . Asthma   . COPD (chronic obstructive pulmonary disease) ([place])   . Diabetes mellitus   . Hyperlipemia   . Hypertension   . OSA on CPAP   . Pneumonia   . PTSD (post-traumatic stress disorder)     Patient Active Problem List   Diagnosis Date Noted  . Dyslipidemia 06/03/2016  . Other fatigue 06/03/2016  . Claudication ([place]) 06/03/2016  . Acute sinusitis 12/16/2015  . Asthma with acute exacerbation 06/17/2015  . Acute bronchitis 06/17/2015  . Gastroesophageal reflux disease without esophagitis 06/17/2015  . Shortness of breath 04/17/2015  . Acute pulmonary edema ([place]) 04/17/2015  . Chronic obstructive pulmonary disease, unspecified copd, unspecified chronic bronchitis type 01/07/2015  . Moderate persistent asthma 01/07/2015  . Other allergic rhinitis 01/07/2015  . Obstructive sleep apnea treated with continuous positive airway pressure (CPAP) 01/07/2015  . Asthma-COPD overlap syndrome (HCC)     Class: Chronic  . Pneumonia due to other gram-negative bacteria ([place]) 01/30/2011  . Cough 06/09/2010  . COPD (chronic obstructive pulmonary disease) ([place]) 06/09/2010  . OSA on CPAP   . Diabetes mellitus   . Hyperlipemia   . Essential hypertension     Past Surgical History:  Procedure Laterality Date  . CARDIAC CATHETERIZATION    . right thumb surgery     . TONSILLECTOMY         Home Medications    Prior to Admission medications   Medication Sig Start Date End Date Taking? Authorizing Provider  albuterol (VENTOLIN HFA) 108 (90 Base) MCG/ACT inhaler INHALE 2 PUFFS BY MOUTH EVERY 4 HOURS AS NEEDED FOR WHEEZING OR SHORTNESS OF BREATH 08/22/19   Garnet Sierras, DO  amLODipine (NORVASC) 10 MG tablet Take 10 mg by mouth daily. 04/06/19   [provider]  amLODipine-olmesartan (AZOR) 10-40 MG per tablet Take 1 tablet by mouth daily.      [provider]  Ascorbic Acid (VITAMIN C) 1000 MG tablet Take 1,000 mg by mouth daily. Reported on 06/17/2015    [provider]  ascorbic acid (VITAMIN C) 500 MG tablet  04/06/19   [provider]  aspirin EC 325 MG tablet Take 325 mg by mouth daily.     [provider]  azelastine (ASTELIN) 0.1 % nasal spray Place 1-2 sprays into both nostrils 2 (two) times daily. 01/24/18   Bobbitt, Sedalia Muta, MD  benzonatate (TESSALON) 200 MG capsule Take 200 mg by mouth 3 (three) times daily. 10/22/19   [provider]  budesonide-formoterol (SYMBICORT) 160-4.5 MCG/ACT inhaler Inhale 2 puffs into the lungs 2 (two) times daily. 01/24/18   Bobbitt, Sed[place] Muta, MD  carvedilol (COREG) 3.125 MG tablet Take 3.125 mg by mouth 2 (two) times daily.  11/29/14   [provider]  Cellulose Carmellose  Sodium POWD Apply 1 application topically daily as needed (irritation). Apply to feet    [provider]  Cholecalciferol (VITAMIN D-1000 MAX ST) 1000 UNITS tablet Take 1,000 Units by mouth daily. Reported on 06/17/2015    [provider]  clindamycin-benzoyl peroxide (BENZACLIN) gel Apply 1 application topically daily at 3 pm.     [provider]  clobetasol (TEMOVATE) 0.05 % cream Apply 1 application topically daily as needed (hands, arms, neck, thighs).     [provider]  clotrimazole (LOTRIMIN) 1 % cream Apply 1 application topically daily as  needed (s). For skin condition    [provider]  Diclofenac Sodium 1.5 % SOLN Apply 12 drops topically 2 (two) times a week. Uses 12-14 drops on scalp as needed for dryness 10/17/14   [provider]  doxazosin (CARDURA) 1 MG tablet Take 1 mg by mouth daily. 04/03/19   [provider]  EPINEPHrine 0.3 mg/0.3 mL IJ SOAJ injection Inject 0.3 mLs (0.3 mg total) into the muscle as needed for anaphylaxis. 11/16/19   Kennith Gain, MD  EYLEA 2 MG/0.05ML SOLN  04/17/19   [provider]  furosemide (LASIX) 40 MG tablet Take 1 tablet (40 mg total) by mouth daily. 06/07/19   Hilty, Nadean Corwin, MD  guaifenesin (HUMIBID E) 400 MG TABS tablet  04/05/19   [provider]  ketoconazole (NIZORAL) 2 % cream Apply 1 application topically daily.     [provider]  metFORMIN (GLUCOPHAGE) 500 MG tablet Take 1,000 mg by mouth Twice daily.  07/28/10   [provider]  methocarbamol (ROBAXIN) 500 MG tablet Take 1 tablet (500 mg total) by mouth 2 (two) times daily. 10/12/17   Raylene Everts, MD  MI-ACID GAS RELIEF 80 MG chewable tablet  04/06/19   [provider]  montelukast (SINGULAIR) 10 MG tablet Take 10 mg by mouth daily. 04/06/19   [provider]  Multiple Vitamins-Minerals (MULTIVITAMIN & MINERAL PO) Take 1 tablet by mouth daily. Reported on 06/17/2015    [provider]  OneTouch Delica Lancets 65Y MISC daily. 09/01/19   [provider]  Donald Siva test strip SMARTSIG:Via Meter Once PRN 09/01/19   [provider]  Respiratory Therapy Supplies (FLUTTER) DEVI Use as directed 04/20/16   Bobbitt, Sedalia Muta, MD  rosuvastatin (CRESTOR) 10 MG tablet Take 10 mg by mouth daily. Patient not taking: Reported on 08/21/2019    [provider]  sertraline (ZOLOFT) 100 MG tablet Take 100 mg by mouth daily. 04/05/19   [provider]  Sertraline HCl (ZOLOFT PO) Take by mouth.    [provider]  Tiotropium Bromide Monohydrate (SPIRIVA RESPIMAT) 2.5 MCG/ACT AERS Inhale 2 puffs one daily 08/21/19   Garnet Sierras, DO  triamcinolone (KENALOG) 0.1 % cream Apply topically 2 (two) times daily. Reported on 06/17/2015    [provider]    Family History Family History  Problem Relation Age of Onset  . Emphysema Father   . Asthma Father   . Heart disease Father   . Heart attack Father   . Stroke Mother   . Heart attack Brother   . Diabetes Brother   . Heart attack Brother   . Diabetes Brother   . Colon cancer Neg Hx     Social History Social History   Tobacco Use  . Smoking status: Former Smoker    Packs/day: 1.00    Years: 20.00    Pack years: 20.00  Types: Cigarettes    Quit date: 03/09/1985    Years since quitting: 34.7  . Smokeless tobacco: Never Used  Vaping Use  . Vaping Use: Never used  Substance Use Topics  . Alcohol use: No  . Drug use: No     Allergies   Codeine, Lipitor [atorvastatin], and Pravachol [pravastatin sodium]   Review of Systems Review of Systems  Reason unable to perform ROS: See HPI as above.     Physical Exam Triage Vital Signs ED Triage Vitals  Enc Vitals Group     BP 11/20/19 1128 96/67     Pulse Rate 11/20/19 1128 85     Resp 11/20/19 1128 18     Temp 11/20/19 1128 99.1 F (37.3 C)     Temp Source 11/20/19 1128 Oral     SpO2 11/20/19 1128 92 %     Weight --      Height --      Head Circumference --      Peak Flow --      Pain Score 11/20/19 1131 0     Pain Loc --      Pain Edu? --      Excl. in [place]? --    No data found.  Updated Vital Signs BP 96/67 (BP Location: Right Arm)   Pulse 85   Temp 99.1 F (37.3 C) (Oral)   Resp 18   SpO2 92%   Visual Acuity Right Eye Distance:   Left Eye Distance:   Bilateral Distance:    Right Eye Near:   Left Eye Near:    Bilateral Near:     Physical Exam Constitutional:      General: He is not in acute distress.    Appearance: Normal appearance. He  is not ill-appearing, toxic-appearing or diaphoretic.  HENT:     Head: Normocephalic and atraumatic.     Mouth/Throat:     Mouth: Mucous membranes are moist.     Pharynx: Oropharynx is clear. Uvula midline.  Cardiovascular:     Rate and Rhythm: Normal rate and regular rhythm.     Heart sounds: Normal heart sounds. No murmur heard.  No friction rub. No gallop.   Pulmonary:     Effort: Pulmonary effort is normal. No accessory muscle usage, prolonged expiration, respiratory distress or retractions.     Comments: Lungs clear to auscultation without adventitious lung sounds. Speaking in full sentences without difficulty Musculoskeletal:     Cervical back: Normal range of motion and neck supple.  Neurological:     General: No focal deficit present.     Mental Status: He is alert and oriented to person, place, and time.      UC Treatments / Results  Labs (all labs ordered are listed, but only abnormal results are displayed) Labs Reviewed  NOVEL CORONAVIRUS, NAA    EKG   Radiology No results found.  Procedures Procedures (including critical care time)  Medications Ordered in UC Medications - No data to display  Initial Impression / Assessment and Plan / UC Course  I have reviewed the triage vital signs and the nursing notes.  Pertinent labs & imaging results that were available during my care of the patient were reviewed by me and considered in my medical decision making (see chart for details).    O2 in low 90s. Given history and exam, offered CXR. Unfortunately clinic without RT on site today. Offered outpatient CXR, for which patient deferred for now. COVID testing ordered. Given recent  new asthma treatment, will have patient inform patient of current symptoms. Otherwise low threshold for ED visit. Strict return precautions given. Patient expresses understanding and agrees to plan.  Final Clinical Impressions(s) / UC Diagnoses   Final diagnoses:  Encounter for screening  for COVID-19  Cough  Shortness of breath    ED Prescriptions    None     PDMP not reviewed this encounter.   Ok Edwards, PA-C 11/20/19 1252

## 2019-11-20 NOTE — ED Triage Notes (Signed)
Pt present fever, chills, and weakness. Symptoms started on Saturday. Its possible pt was exposed to covid on Friday but he is unsure.

## 2019-11-20 NOTE — Discharge Instructions (Signed)
COVID PCR testing ordered. I would like you to quarantine until testing results. Increase albuterol use. Tylenol/motrin for pain and fever. Keep hydrated, urine should be clear to pale yellow in color. If experiencing any worsening symptoms, go to the emergency department for further evaluation needed.   Please call your asthma doctor in regards to the new medicine you received few days ago and if prednisone can be added. As discussed, you may need imaging as well.

## 2019-11-20 NOTE — Telephone Encounter (Signed)
It's okay to take prednisone.  He was supposed to be on Symbicort already.  Nucala is a biologic injection for his asthma.  This is his asthma regimen:   Daily controller medication(s):  Symbicort 163mcg 2 puffs twice a day with spacer and rinse mouth afterwards.  Alvesco 36mcg 2 puffs twice a day with spacer and rinse mouth afterwards.  Spiriva 2.47mcg 2 puffs ONCE a day.   Continue with Singulair 10mg  daily at night.

## 2019-11-20 NOTE — Telephone Encounter (Signed)
Patient went to urgent care today and they have never heard of Nucala. Patient states that they did not give him any medication because they did not know if it would be compatible with the Nucala shot. Patient was seen by Cathlean Sauer at [facility] and would like [place] to inform them of his Nucala shot.  Please advise.

## 2019-11-20 NOTE — Telephone Encounter (Signed)
Called and spoke to patient to get more information on his visit. He states he was exposed to Covid on Friday and started showing symptoms on Saturday (shortness of breath, weakness, wheezing and cough). He states they were going to prescribe prednisone and symbicort but they did not want to give it to him since he's on Nucala and they were not sure what it was and what it was for. He also states they wanted to get an x-ray but he had no one to take him to get it.   Patient should be fine to take those medications, correct?

## 2019-11-20 NOTE — Telephone Encounter (Signed)
Called and left a message for patient to call us back to discuss his asthma regimen.

## 2019-11-22 ENCOUNTER — Inpatient Hospital Stay (HOSPITAL_COMMUNITY)
Admission: EM | Admit: 2019-11-22 | Discharge: 2019-11-24 | DRG: 177 | Disposition: A | Discharge status: Home / Self Care | Payer: Medicare Other | Attending: Internal Medicine | Admitting: Internal Medicine

## 2019-11-22 ENCOUNTER — Other Ambulatory Visit: Payer: Self-pay

## 2019-11-22 ENCOUNTER — Ambulatory Visit: Payer: Self-pay

## 2019-11-22 ENCOUNTER — Emergency Department (HOSPITAL_COMMUNITY): Payer: Medicare Other

## 2019-11-22 ENCOUNTER — Encounter (HOSPITAL_COMMUNITY): Payer: Self-pay

## 2019-11-22 DIAGNOSIS — Z7982 Long term (current) use of aspirin: Secondary | ICD-10-CM | POA: Diagnosis not present

## 2019-11-22 DIAGNOSIS — Z9989 Dependence on other enabling machines and devices: Secondary | ICD-10-CM | POA: Diagnosis not present

## 2019-11-22 DIAGNOSIS — E669 Obesity, unspecified: Secondary | ICD-10-CM | POA: Diagnosis present

## 2019-11-22 DIAGNOSIS — Z6830 Body mass index (BMI) 30.0-30.9, adult: Secondary | ICD-10-CM

## 2019-11-22 DIAGNOSIS — E785 Hyperlipidemia, unspecified: Secondary | ICD-10-CM | POA: Diagnosis present

## 2019-11-22 DIAGNOSIS — J9601 Acute respiratory failure with hypoxia: Secondary | ICD-10-CM | POA: Diagnosis present

## 2019-11-22 DIAGNOSIS — Z833 Family history of diabetes mellitus: Secondary | ICD-10-CM | POA: Diagnosis not present

## 2019-11-22 DIAGNOSIS — Z885 Allergy status to narcotic agent status: Secondary | ICD-10-CM | POA: Diagnosis not present

## 2019-11-22 DIAGNOSIS — I10 Essential (primary) hypertension: Secondary | ICD-10-CM | POA: Diagnosis present

## 2019-11-22 DIAGNOSIS — Z888 Allergy status to other drugs, medicaments and biological substances status: Secondary | ICD-10-CM

## 2019-11-22 DIAGNOSIS — Z79899 Other long term (current) drug therapy: Secondary | ICD-10-CM

## 2019-11-22 DIAGNOSIS — Z87891 Personal history of nicotine dependence: Secondary | ICD-10-CM | POA: Diagnosis not present

## 2019-11-22 DIAGNOSIS — J449 Chronic obstructive pulmonary disease, unspecified: Secondary | ICD-10-CM | POA: Diagnosis present

## 2019-11-22 DIAGNOSIS — Z7984 Long term (current) use of oral hypoglycemic drugs: Secondary | ICD-10-CM | POA: Diagnosis not present

## 2019-11-22 DIAGNOSIS — G4733 Obstructive sleep apnea (adult) (pediatric): Secondary | ICD-10-CM | POA: Diagnosis present

## 2019-11-22 DIAGNOSIS — Z825 Family history of asthma and other chronic lower respiratory diseases: Secondary | ICD-10-CM

## 2019-11-22 DIAGNOSIS — Z8249 Family history of ischemic heart disease and other diseases of the circulatory system: Secondary | ICD-10-CM

## 2019-11-22 DIAGNOSIS — F4310 Post-traumatic stress disorder, unspecified: Secondary | ICD-10-CM | POA: Diagnosis present

## 2019-11-22 DIAGNOSIS — J1282 Pneumonia due to coronavirus disease 2019: Secondary | ICD-10-CM

## 2019-11-22 DIAGNOSIS — Z7951 Long term (current) use of inhaled steroids: Secondary | ICD-10-CM

## 2019-11-22 DIAGNOSIS — R197 Diarrhea, unspecified: Secondary | ICD-10-CM | POA: Diagnosis present

## 2019-11-22 DIAGNOSIS — J440 Chronic obstructive pulmonary disease with acute lower respiratory infection: Secondary | ICD-10-CM | POA: Diagnosis present

## 2019-11-22 DIAGNOSIS — E119 Type 2 diabetes mellitus without complications: Secondary | ICD-10-CM | POA: Diagnosis present

## 2019-11-22 DIAGNOSIS — R0902 Hypoxemia: Secondary | ICD-10-CM | POA: Diagnosis not present

## 2019-11-22 DIAGNOSIS — J189 Pneumonia, unspecified organism: Secondary | ICD-10-CM | POA: Diagnosis not present

## 2019-11-22 DIAGNOSIS — Z823 Family history of stroke: Secondary | ICD-10-CM

## 2019-11-22 DIAGNOSIS — U071 COVID-19: Secondary | ICD-10-CM

## 2019-11-22 HISTORY — DX: COVID-19: U07.1

## 2019-11-22 LAB — LACTATE DEHYDROGENASE: LDH: 209 U/L — ABNORMAL HIGH (ref 98–192)

## 2019-11-22 LAB — CBC WITH DIFFERENTIAL/PLATELET
Abs Immature Granulocytes: 0.01 10*3/uL (ref 0.00–0.07)
Basophils Absolute: 0 10*3/uL (ref 0.0–0.1)
Basophils Relative: 0 %
Eosinophils Absolute: 0 10*3/uL (ref 0.0–0.5)
Eosinophils Relative: 0 %
HCT: 45.2 % (ref 39.0–52.0)
Hemoglobin: 14.3 g/dL (ref 13.0–17.0)
Immature Granulocytes: 0 %
Lymphocytes Relative: 49 %
Lymphs Abs: 2.3 10*3/uL (ref 0.7–4.0)
MCH: 29.8 pg (ref 26.0–34.0)
MCHC: 31.6 g/dL (ref 30.0–36.0)
MCV: 94.2 fL (ref 80.0–100.0)
Monocytes Absolute: 0.6 10*3/uL (ref 0.1–1.0)
Monocytes Relative: 13 %
Neutro Abs: 1.8 10*3/uL (ref 1.7–7.7)
Neutrophils Relative %: 38 %
Platelets: 163 10*3/uL (ref 150–400)
RBC: 4.8 MIL/uL (ref 4.22–5.81)
RDW: 14.3 % (ref 11.5–15.5)
WBC: 4.6 10*3/uL (ref 4.0–10.5)
nRBC: 0 % (ref 0.0–0.2)

## 2019-11-22 LAB — BASIC METABOLIC PANEL
Anion gap: 11 (ref 5–15)
BUN: 13 mg/dL (ref 8–23)
CO2: 26 mmol/L (ref 22–32)
Calcium: 10 mg/dL (ref 8.9–10.3)
Chloride: 95 mmol/L — ABNORMAL LOW (ref 98–111)
Creatinine, Ser: 1.17 mg/dL (ref 0.61–1.24)
GFR calc Af Amer: >60 mL/min (ref >60)
GFR calc non Af Amer: >60 mL/min (ref >60)
Glucose, Bld: 155 mg/dL — ABNORMAL HIGH (ref 70–99)
Potassium: 3.5 mmol/L (ref 3.5–5.1)
Sodium: 132 mmol/L — ABNORMAL LOW (ref 135–145)

## 2019-11-22 LAB — CBC
HCT: 46 % (ref 39.0–52.0)
Hemoglobin: 14.8 g/dL (ref 13.0–17.0)
MCH: 29.6 pg (ref 26.0–34.0)
MCHC: 32.2 g/dL (ref 30.0–36.0)
MCV: 92 fL (ref 80.0–100.0)
Platelets: 167 10*3/uL (ref 150–400)
RBC: 5 MIL/uL (ref 4.22–5.81)
RDW: 14.3 % (ref 11.5–15.5)
WBC: 4.1 10*3/uL (ref 4.0–10.5)
nRBC: 0 % (ref 0.0–0.2)

## 2019-11-22 LAB — LACTIC ACID, PLASMA: Lactic Acid, Venous: 1.8 mmol/L (ref 0.5–1.9)

## 2019-11-22 LAB — SARS-COV-2, NAA 2 DAY TAT

## 2019-11-22 LAB — TRIGLYCERIDES: Triglycerides: 223 mg/dL — ABNORMAL HIGH (ref <150)

## 2019-11-22 LAB — FIBRINOGEN: Fibrinogen: 518 mg/dL — ABNORMAL HIGH (ref 210–475)

## 2019-11-22 LAB — D-DIMER, QUANTITATIVE: D-Dimer, Quant: 0.97 ug/mL-FEU — ABNORMAL HIGH (ref 0.00–0.50)

## 2019-11-22 LAB — NOVEL CORONAVIRUS, NAA: SARS-CoV-2, NAA: DETECTED — AB

## 2019-11-22 LAB — PROCALCITONIN: Procalcitonin: <0.1 ng/mL

## 2019-11-22 MED ORDER — SODIUM CHLORIDE 0.9 % IV SOLN
100.0000 mg | Freq: Every day | INTRAVENOUS | Status: DC
  Administered 2019-11-23 – 2019-11-24 (×2): 100 mg via INTRAVENOUS
  Filled 2019-11-22 (×3): qty 20

## 2019-11-22 MED ORDER — ASPIRIN EC 325 MG PO TBEC
325.0000 mg | DELAYED_RELEASE_TABLET | Freq: Every day | ORAL | Status: DC
  Administered 2019-11-23 – 2019-11-24 (×2): 325 mg via ORAL
  Filled 2019-11-22 (×2): qty 1

## 2019-11-22 MED ORDER — PREDNISONE 20 MG PO TABS: 50.0000 mg | ORAL_TABLET | Freq: Every day | ORAL | Status: DC

## 2019-11-22 MED ORDER — INSULIN ASPART 100 UNIT/ML ~~LOC~~ SOLN
0.0000 [IU] | Freq: Three times a day (TID) | SUBCUTANEOUS | Status: DC
  Administered 2019-11-23 (×3): 3 [IU] via SUBCUTANEOUS
  Administered 2019-11-24: 2 [IU] via SUBCUTANEOUS
  Administered 2019-11-24: 3 [IU] via SUBCUTANEOUS

## 2019-11-22 MED ORDER — SENNOSIDES-DOCUSATE SODIUM 8.6-50 MG PO TABS: 1.0000 | ORAL_TABLET | Freq: Every evening | ORAL | Status: DC | PRN

## 2019-11-22 MED ORDER — AMLODIPINE BESYLATE 10 MG PO TABS
10.0000 mg | ORAL_TABLET | Freq: Every day | ORAL | Status: DC
  Administered 2019-11-23 – 2019-11-24 (×2): 10 mg via ORAL
  Filled 2019-11-22 (×2): qty 1

## 2019-11-22 MED ORDER — METFORMIN HCL 500 MG PO TABS: 1000.0000 mg | ORAL_TABLET | Freq: Two times a day (BID) | ORAL | Status: DC

## 2019-11-22 MED ORDER — INSULIN ASPART 100 UNIT/ML ~~LOC~~ SOLN: 0.0000 [IU] | Freq: Every day | SUBCUTANEOUS | Status: DC

## 2019-11-22 MED ORDER — ONDANSETRON HCL 4 MG PO TABS: 4.0000 mg | ORAL_TABLET | Freq: Four times a day (QID) | ORAL | Status: DC | PRN

## 2019-11-22 MED ORDER — CARVEDILOL 3.125 MG PO TABS
3.1250 mg | ORAL_TABLET | Freq: Two times a day (BID) | ORAL | Status: DC
  Administered 2019-11-23 – 2019-11-24 (×4): 3.125 mg via ORAL
  Filled 2019-11-22 (×5): qty 1

## 2019-11-22 MED ORDER — AMLODIPINE-OLMESARTAN 10-40 MG PO TABS: 1.0000 | ORAL_TABLET | Freq: Every day | ORAL | Status: DC

## 2019-11-22 MED ORDER — SODIUM CHLORIDE 0.9 % IV SOLN: 100.0000 mg | Freq: Every day | INTRAVENOUS | Status: DC

## 2019-11-22 MED ORDER — ONDANSETRON HCL 4 MG/2ML IJ SOLN: 4.0000 mg | Freq: Four times a day (QID) | INTRAMUSCULAR | Status: DC | PRN

## 2019-11-22 MED ORDER — METHYLPREDNISOLONE SODIUM SUCC 125 MG IJ SOLR
1.0000 mg/kg | Freq: Two times a day (BID) | INTRAMUSCULAR | Status: DC
  Administered 2019-11-23 – 2019-11-24 (×4): 86.25 mg via INTRAVENOUS
  Filled 2019-11-22 (×4): qty 2

## 2019-11-22 MED ORDER — SODIUM CHLORIDE 0.9 % IV SOLN: 200.0000 mg | Freq: Once | INTRAVENOUS | Status: DC

## 2019-11-22 MED ORDER — IRBESARTAN 300 MG PO TABS
300.0000 mg | ORAL_TABLET | Freq: Every day | ORAL | Status: DC
  Administered 2019-11-23 – 2019-11-24 (×2): 300 mg via ORAL
  Filled 2019-11-22 (×2): qty 1

## 2019-11-22 MED ORDER — MOMETASONE FURO-FORMOTEROL FUM 200-5 MCG/ACT IN AERO
2.0000 | INHALATION_SPRAY | Freq: Two times a day (BID) | RESPIRATORY_TRACT | Status: DC
  Administered 2019-11-23 – 2019-11-24 (×4): 2 via RESPIRATORY_TRACT
  Filled 2019-11-22 (×2): qty 8.8

## 2019-11-22 MED ORDER — ALBUTEROL SULFATE HFA 108 (90 BASE) MCG/ACT IN AERS
2.0000 | INHALATION_SPRAY | RESPIRATORY_TRACT | Status: DC | PRN
  Filled 2019-11-22: qty 6.7

## 2019-11-22 MED ORDER — GUAIFENESIN-DM 100-10 MG/5ML PO SYRP
10.0000 mL | ORAL_SOLUTION | ORAL | Status: DC | PRN
  Administered 2019-11-23: 10 mL via ORAL
  Filled 2019-11-22: qty 10

## 2019-11-22 MED ORDER — ENOXAPARIN SODIUM 40 MG/0.4ML ~~LOC~~ SOLN
40.0000 mg | Freq: Every day | SUBCUTANEOUS | Status: DC
  Administered 2019-11-23 (×2): 40 mg via SUBCUTANEOUS
  Filled 2019-11-22 (×2): qty 0.4

## 2019-11-22 MED ORDER — LACTATED RINGERS IV SOLN: INTRAVENOUS | Status: DC

## 2019-11-22 MED ORDER — SODIUM CHLORIDE 0.9 % IV SOLN
200.0000 mg | Freq: Once | INTRAVENOUS | Status: AC
  Administered 2019-11-22: 200 mg via INTRAVENOUS
  Filled 2019-11-22: qty 40

## 2019-11-22 MED ORDER — AMLODIPINE BESYLATE 5 MG PO TABS: 10.0000 mg | ORAL_TABLET | Freq: Every day | ORAL | Status: DC

## 2019-11-22 NOTE — ED Provider Notes (Signed)
[facility] Provider Note   CSN: [id number] Arrival date & time: 11/22/19  1130     History Chief Complaint  Patient presents with  . COVID+  . Shortness of Breath  . Weakness    Douglas Hansen is a 74 y.o. male.  HPI    Patient with multiple medical issues presents with dyspnea, fatigue. Patient became ill about 1 week ago, was diagnosed with coronavirus 3 days ago.  He has not received any novel therapy since diagnosis. He denies fever, vomiting, diarrhea, does have weakness, some anorexia, and worsening dyspnea.  Patient notes that he received his coronavirus vaccine earlier in the year.   Past Medical History:  Diagnosis Date  . Agent orange exposure   . Arthritis   . Asthma   . COPD (chronic obstructive pulmonary disease) ([place])   . Diabetes mellitus   . Hyperlipemia   . Hypertension   . OSA on CPAP   . Pneumonia   . PTSD (post-traumatic stress disorder)     Patient Active Problem List   Diagnosis Date Noted  . Dyslipidemia 06/03/2016  . Other fatigue 06/03/2016  . Claudication ([place]) 06/03/2016  . Acute sinusitis 12/16/2015  . Asthma with acute exacerbation 06/17/2015  . Acute bronchitis 06/17/2015  . Gastroesophageal reflux disease without esophagitis 06/17/2015  . Shortness of breath 04/17/2015  . Acute pulmonary edema ([place]) 04/17/2015  . Chronic obstructive pulmonary disease, unspecified copd, unspecified chronic bronchitis type 01/07/2015  . Moderate persistent asthma 01/07/2015  . Other allergic rhinitis 01/07/2015  . Obstructive sleep apnea treated with continuous positive airway pressure (CPAP) 01/07/2015  . Asthma-COPD overlap syndrome (HCC)     Class: Chronic  . Pneumonia due to other gram-negative bacteria ([place]) 01/30/2011  . Cough 06/09/2010  . COPD (chronic obstructive pulmonary disease) ([place]) 06/09/2010  . OSA on CPAP   . Diabetes mellitus   . Hyperlipemia   . Essential hypertension     Past  Surgical History:  Procedure Laterality Date  . CARDIAC CATHETERIZATION    . right thumb surgery    . TONSILLECTOMY         Family History  Problem Relation Age of Onset  . Emphysema Father   . Asthma Father   . Heart disease Father   . Heart attack Father   . Stroke Mother   . Heart attack Brother   . Diabetes Brother   . Heart attack Brother   . Diabetes Brother   . Colon cancer Neg Hx     Social History   Tobacco Use  . Smoking status: Former Smoker    Packs/day: 1.00    Years: 20.00    Pack years: 20.00    Types: Cigarettes    Quit date: 03/09/1985    Years since quitting: 34.7  . Smokeless tobacco: Never Used  Vaping Use  . Vaping Use: Never used  Substance Use Topics  . Alcohol use: No  . Drug use: No    Home Medications Prior to Admission medications   Medication Sig Start Date End Date Taking? Authorizing Provider  albuterol (VENTOLIN HFA) 108 (90 Base) MCG/ACT inhaler INHALE 2 PUFFS BY MOUTH EVERY 4 HOURS AS NEEDED FOR WHEEZING OR SHORTNESS OF BREATH 08/22/19   Garnet Sierras, DO  amLODipine (NORVASC) 10 MG tablet Take 10 mg by mouth daily. 04/06/19   [provider]  amLODipine-olmesartan (AZOR) 10-40 MG per tablet Take 1 tablet by mouth daily.      [provider]  Ascorbic Acid (VITAMIN C) 1000 MG tablet Take 1,000 mg by mouth daily. Reported on 06/17/2015    [provider]  ascorbic acid (VITAMIN C) 500 MG tablet  04/06/19   [provider]  aspirin EC 325 MG tablet Take 325 mg by mouth daily.     [provider]  azelastine (ASTELIN) 0.1 % nasal spray Place 1-2 sprays into both nostrils 2 (two) times daily. 01/24/18   Bobbitt, Sedalia Muta, MD  benzonatate (TESSALON) 200 MG capsule Take 200 mg by mouth 3 (three) times daily. 10/22/19   [provider]  budesonide-formoterol (SYMBICORT) 160-4.5 MCG/ACT inhaler Inhale 2 puffs into the lungs 2 (two) times daily. 01/24/18   Bobbitt, [place] Muta, MD   carvedilol (COREG) 3.125 MG tablet Take 3.125 mg by mouth 2 (two) times daily.  11/29/14   [provider]  Cellulose Carmellose Sodium POWD Apply 1 application topically daily as needed (irritation). Apply to feet    [provider]  Cholecalciferol (VITAMIN D-1000 MAX ST) 1000 UNITS tablet Take 1,000 Units by mouth daily. Reported on 06/17/2015    [provider]  clindamycin-benzoyl peroxide (BENZACLIN) gel Apply 1 application topically daily at 3 pm.     [provider]  clobetasol (TEMOVATE) 0.05 % cream Apply 1 application topically daily as needed (hands, arms, neck, thighs).     [provider]  clotrimazole (LOTRIMIN) 1 % cream Apply 1 application topically daily as needed (s). For skin condition    [provider]  Diclofenac Sodium 1.5 % SOLN Apply 12 drops topically 2 (two) times a week. Uses 12-14 drops on scalp as needed for dryness 10/17/14   [provider]  doxazosin (CARDURA) 1 MG tablet Take 1 mg by mouth daily. 04/03/19   [provider]  EPINEPHrine 0.3 mg/0.3 mL IJ SOAJ injection Inject 0.3 mLs (0.3 mg total) into the muscle as needed for anaphylaxis. 11/16/19   Kennith Gain, MD  EYLEA 2 MG/0.05ML SOLN  04/17/19   [provider]  furosemide (LASIX) 40 MG tablet Take 1 tablet (40 mg total) by mouth daily. 06/07/19   Hilty, Nadean Corwin, MD  guaifenesin (HUMIBID E) 400 MG TABS tablet  04/05/19   [provider]  ketoconazole (NIZORAL) 2 % cream Apply 1 application topically daily.     [provider]  metFORMIN (GLUCOPHAGE) 500 MG tablet Take 1,000 mg by mouth Twice daily.  07/28/10   [provider]  methocarbamol (ROBAXIN) 500 MG tablet Take 1 tablet (500 mg total) by mouth 2 (two) times daily. 10/12/17   Raylene Everts, MD  MI-ACID GAS RELIEF 80 MG chewable tablet  04/06/19   [provider]  montelukast (SINGULAIR) 10 MG tablet Take 10 mg by mouth daily.  04/06/19   [provider]  Multiple Vitamins-Minerals (MULTIVITAMIN & MINERAL PO) Take 1 tablet by mouth daily. Reported on 06/17/2015    [provider]  OneTouch Delica Lancets 15B MISC daily. 09/01/19   [provider]  Donald Siva test strip SMARTSIG:Via Meter Once PRN 09/01/19   [provider]  Respiratory Therapy Supplies (FLUTTER) DEVI Use as directed 04/20/16   Bobbitt, Sedalia Muta, MD  rosuvastatin (CRESTOR) 10 MG tablet Take 10 mg by mouth daily. Patient not taking: Reported on 08/21/2019    [provider]  sertraline (ZOLOFT) 100 MG tablet Take 100 mg by mouth daily. 04/05/19   [provider]  Sertraline HCl (ZOLOFT PO) Take by mouth.  [provider]  Tiotropium Bromide Monohydrate (SPIRIVA RESPIMAT) 2.5 MCG/ACT AERS Inhale 2 puffs one daily 08/21/19   Garnet Sierras, DO  triamcinolone (KENALOG) 0.1 % cream Apply topically 2 (two) times daily. Reported on 06/17/2015    [provider]    Allergies    Codeine, Lipitor [atorvastatin], and Pravachol [pravastatin sodium]  Review of Systems   Review of Systems  Constitutional:       Per HPI, otherwise negative  HENT:       Per HPI, otherwise negative  Respiratory:       Per HPI, otherwise negative  Cardiovascular:       Per HPI, otherwise negative  Gastrointestinal: Negative for vomiting.  Endocrine:       Negative aside from HPI  Genitourinary:       Neg aside from HPI   Musculoskeletal:       Per HPI, otherwise negative  Skin: Negative.   Neurological: Negative for syncope.    Physical Exam Updated Vital Signs BP 122/62 (BP Location: Right Arm)   Pulse 70   Temp 98.3 F (36.8 C) (Oral)   Resp 18   Ht 5\' 6"  (1.676 m)   Wt 86.2 kg   SpO2 96%   BMI 30.67 kg/m   Physical Exam Vitals and nursing note reviewed.  Constitutional:      General: He is not in acute distress.    Appearance: He is well-developed.  HENT:     Head: Normocephalic  and atraumatic.  Eyes:     Conjunctiva/sclera: Conjunctivae normal.  Cardiovascular:     Rate and Rhythm: Regular rhythm. Tachycardia present.     Pulses: Normal pulses.  Pulmonary:     Effort: Pulmonary effort is normal. No respiratory distress.     Breath sounds: No stridor.  Abdominal:     General: There is no distension.  Skin:    General: Skin is warm and dry.  Neurological:     Mental Status: He is alert and oriented to person, place, and time.     ED Results / Procedures / Treatments   Labs (all labs ordered are listed, but only abnormal results are displayed) Labs Reviewed  BASIC METABOLIC PANEL - Abnormal; Notable for the following components:      Result Value   Sodium 132 (*)    Chloride 95 (*)    Glucose, Bld 155 (*)    All other components within normal limits  D-DIMER, QUANTITATIVE (NOT AT [facility]) - Abnormal; Notable for the following components:   D-Dimer, Quant 0.97 (*)    All other components within normal limits  LACTATE DEHYDROGENASE - Abnormal; Notable for the following components:   LDH 209 (*)    All other components within normal limits  FIBRINOGEN - Abnormal; Notable for the following components:   Fibrinogen 518 (*)    All other components within normal limits  TRIGLYCERIDES - Abnormal; Notable for the following components:   Triglycerides 223 (*)    All other components within normal limits  CULTURE, BLOOD (ROUTINE X 2)  CULTURE, BLOOD (ROUTINE X 2)  CBC  LACTIC ACID, PLASMA  CBC WITH DIFFERENTIAL/PLATELET  LACTIC ACID, PLASMA  PROCALCITONIN  C-REACTIVE PROTEIN  FERRITIN    EKG EKG Interpretation  Date/Time:  Wednesday November 22 2019 11:46:20 EDT Ventricular Rate:  86 PR Interval:  148 QRS Duration: 80 QT Interval:  364 QTC Calculation: 435 R Axis:   -21 Text Interpretation: Normal sinus rhythm Anterior injury pattern Artifact ST-t  wave abnormality Abnormal ECG Confirmed by Carmin Muskrat [phone]) on 11/22/2019 7:29:15  PM   Radiology DG Chest Portable 1 View  Result Date: 11/22/2019 CLINICAL DATA:  COVID-19 positive EXAM: PORTABLE CHEST 1 VIEW COMPARISON:  July 06, 2017 FINDINGS: There is subtle ill-defined opacity in the right mid lung and left base regions. Lungs elsewhere clear. Heart size and pulmonary vascularity are normal. No adenopathy. No bone lesions. IMPRESSION: Subtle airspace opacity right mid lung and left base, likely representing foci of atypical organism pneumonia. No consolidation. Lungs otherwise clear. Cardiac silhouette normal. No adenopathy. Electronically Signed   By: Lowella Grip III M.D.   On: 11/22/2019 12:41    Procedures Procedures (including critical care time)  Medications Ordered in ED Medications - No data to display  ED Course  I have reviewed the triage vital signs and the nursing notes.  Pertinent labs & imaging results that were available during my care of the patient were reviewed by me and considered in my medical decision making (see chart for details).  With 2 L nasal cannula oxygen support patient has saturation 97%.  This adult male previously vaccinated now presents with dyspnea, so no new oxygen requirement. Patient's x-ray, labs from 3 days ago, both consistent with coronavirus pneumonia. Patient's other labs consistent with disease well, no substantial abnormalities, no hemodynamic instability, but given his new oxygen requirement he was admitted for further monitoring, management. Final Clinical Impression(s) / ED Diagnoses Final diagnoses:  Pneumonia due to COVID-19 virus     Carmin Muskrat, MD 11/22/19 1930

## 2019-11-22 NOTE — ED Triage Notes (Signed)
Pt tested positive for COVID on Monday. Symptoms started on Saturday. Pt c.o sob, generalized weakness and fever. Pt has had both moderna vaccines. Oxygen saturation Mid80s on room air. Pt placed on 2L, now at 98%, no resp distress noted. Pt a.o

## 2019-11-22 NOTE — Progress Notes (Signed)
Reached patient and daughter.  Made aware of COVID result.  Reviewed quarantine and ER precautions.  Patient's daughter states he is SOB and she checked his O2 and it is 84%.  This RN recommended EMS transport, or immediate transport via POV to ER.  She verbalized understanding.

## 2019-11-22 NOTE — H&P (Signed)
History and Physical    Douglas Hansen PZW:[id number] DOB: 01-25-1946 DOA: 11/22/2019  PCP: [name], MD   Patient coming from: Home    Chief Complaint: SOB  HPI: Douglas Hansen is a 74 y.o. male with medical history significant for COPD, DM, HTN, OSA, HLD who was diagnosised with COVID two days ago. He did receive both doses of Materna Covid vaccine in February and March of this year.  He reports that yesterday morning he developed numbness of breath is progressively gotten worse over the last 24 hours.  He reports a cough that is nonproductive.  He does report having diarrhea since yesterday morning with no abdominal pain or cramping.  He states he has not had any fever or chills.  Reports he has a decreased energy level for the last 2 to 3 days.  Reports his wife has also been sick with a cough but does not have the profound shortness of breath.  His shortness of breath is worsened with any exertion including walking across the room.  He has had decreased appetite since yesterday but has not had any nausea or vomiting.  Reports has been taking all his medications as prescribed his blood sugar has been stable.  ED Course: Patient was found to have O2 sat in the low 80s on room air and was placed on oxygen by nasal cannula 2 L.  Oxygen saturation is now above 92%.  He continues have a mild dry cough but is otherwise hemodynamically stable.  With his new oxygen requirement hospital service been asked to admit for further management of his Covid.  Chest x-ray does show opacities in the right middle and left lower lung which would be consistent with Covid.  He does not have a elevated white blood cell count and does not have fever to suggest an current bacterial infection.  Covid inflammatory labs are elevated.  Review of Systems:  General: Reports weakness fatigue.  Has fever, chills, weight loss, night sweats.  Denies dizziness.  Reports decreased appetite HENT: Denies head trauma, headache, denies  change in hearing, tinnitus. Reports mild nasal congestion. No nasal bleeding.  Denies sore throat, sores in mouth.  Denies difficulty swallowing Eyes: Denies blurry vision, pain in eye, drainage.  Denies discoloration of eyes. Neck: Denies pain.  Denies swelling.  Denies pain with movement. Cardiovascular: Denies chest pain, palpitations.  Denies edema.  Denies orthopnea Respiratory: Reports shortness of breath with dry cough.  Denies wheezing.  Denies sputum production Gastrointestinal: Reports diarrhea. Denies abdominal pain, swelling.  Denies nausea, vomiting.  Denies melena.  Denies hematemesis. Musculoskeletal: Denies limitation of movement.  Denies deformity or swelling.  Denies pain.  Denies arthralgias or myalgias. Genitourinary: Denies pelvic pain.  Denies urinary frequency or hesitancy.  Denies dysuria.  Skin: Denies rash.  Denies petechiae, purpura, ecchymosis. Neurological: Denies headache.  Denies syncope.  Denies seizure activity.  Denies weakness or paresthesia.  Denies slurred speech, drooping face.  Denies visual change. Psychiatric: Denies depression, anxiety.  Denies suicidal thoughts or ideation.  Denies hallucinations.  Past Medical History:  Diagnosis Date  . Agent orange exposure   . Arthritis   . Asthma   . COPD (chronic obstructive pulmonary disease) ([place])   . Diabetes mellitus   . Hyperlipemia   . Hypertension   . OSA on CPAP   . Pneumonia   . PTSD (post-traumatic stress disorder)     Past Surgical History:  Procedure Laterality Date  . CARDIAC CATHETERIZATION    . right thumb  surgery    . TONSILLECTOMY      Social History  reports that he quit smoking about 34 years ago. His smoking use included cigarettes. He has a 20.00 pack-year smoking history. He has never used smokeless tobacco. He reports that he does not drink alcohol and does not use drugs.  Allergies  Allergen Reactions  . Codeine Other (See Comments)    REACTION: GI upset  . Lipitor  [Atorvastatin]   . Pravachol [Pravastatin Sodium]     Family History  Problem Relation Age of Onset  . Emphysema Father   . Asthma Father   . Heart disease Father   . Heart attack Father   . Stroke Mother   . Heart attack Brother   . Diabetes Brother   . Heart attack Brother   . Diabetes Brother   . Colon cancer Neg Hx      Prior to Admission medications   Medication Sig Start Date End Date Taking? Authorizing Provider  albuterol (VENTOLIN HFA) 108 (90 Base) MCG/ACT inhaler INHALE 2 PUFFS BY MOUTH EVERY 4 HOURS AS NEEDED FOR WHEEZING OR SHORTNESS OF BREATH 08/22/19   Garnet Sierras, DO  amLODipine (NORVASC) 10 MG tablet Take 10 mg by mouth daily. 04/06/19   [provider]  amLODipine-olmesartan (AZOR) 10-40 MG per tablet Take 1 tablet by mouth daily.      [provider]  Ascorbic Acid (VITAMIN C) 1000 MG tablet Take 1,000 mg by mouth daily. Reported on 06/17/2015    [provider]  ascorbic acid (VITAMIN C) 500 MG tablet  04/06/19   [provider]  aspirin EC 325 MG tablet Take 325 mg by mouth daily.     [provider]  azelastine (ASTELIN) 0.1 % nasal spray Place 1-2 sprays into both nostrils 2 (two) times daily. 01/24/18   Bobbitt, Sedalia Muta, MD  benzonatate (TESSALON) 200 MG capsule Take 200 mg by mouth 3 (three) times daily. 10/22/19   [provider]  budesonide-formoterol (SYMBICORT) 160-4.5 MCG/ACT inhaler Inhale 2 puffs into the lungs 2 (two) times daily. 01/24/18   Bobbitt, [place] Muta, MD  carvedilol (COREG) 3.125 MG tablet Take 3.125 mg by mouth 2 (two) times daily.  11/29/14   [provider]  Cellulose Carmellose Sodium POWD Apply 1 application topically daily as needed (irritation). Apply to feet    [provider]  Cholecalciferol (VITAMIN D-1000 MAX ST) 1000 UNITS tablet Take 1,000 Units by mouth daily. Reported on 06/17/2015    [provider]  clindamycin-benzoyl peroxide (BENZACLIN) gel  Apply 1 application topically daily at 3 pm.     [provider]  clobetasol (TEMOVATE) 0.05 % cream Apply 1 application topically daily as needed (hands, arms, neck, thighs).     [provider]  clotrimazole (LOTRIMIN) 1 % cream Apply 1 application topically daily as needed (s). For skin condition    [provider]  Diclofenac Sodium 1.5 % SOLN Apply 12 drops topically 2 (two) times a week. Uses 12-14 drops on scalp as needed for dryness 10/17/14   [provider]  doxazosin (CARDURA) 1 MG tablet Take 1 mg by mouth daily. 04/03/19   [provider]  EPINEPHrine 0.3 mg/0.3 mL IJ SOAJ injection Inject 0.3 mLs (0.3 mg total) into the muscle as needed for anaphylaxis. 11/16/19   Kennith Gain, MD  EYLEA 2 MG/0.05ML SOLN  04/17/19   [provider]  furosemide (LASIX) 40 MG tablet Take 1 tablet (40 mg  total) by mouth daily. 06/07/19   Hilty, Nadean Corwin, MD  guaifenesin (HUMIBID E) 400 MG TABS tablet  04/05/19   [provider]  ketoconazole (NIZORAL) 2 % cream Apply 1 application topically daily.     [provider]  metFORMIN (GLUCOPHAGE) 500 MG tablet Take 1,000 mg by mouth Twice daily.  07/28/10   [provider]  methocarbamol (ROBAXIN) 500 MG tablet Take 1 tablet (500 mg total) by mouth 2 (two) times daily. 10/12/17   Raylene Everts, MD  MI-ACID GAS RELIEF 80 MG chewable tablet  04/06/19   [provider]  montelukast (SINGULAIR) 10 MG tablet Take 10 mg by mouth daily. 04/06/19   [provider]  Multiple Vitamins-Minerals (MULTIVITAMIN & MINERAL PO) Take 1 tablet by mouth daily. Reported on 06/17/2015    [provider]  OneTouch Delica Lancets 65H MISC daily. 09/01/19   [provider]  Donald Siva test strip SMARTSIG:Via Meter Once PRN 09/01/19   [provider]  Respiratory Therapy Supplies (FLUTTER) DEVI Use as directed 04/20/16   Bobbitt, Sedalia Muta, MD   rosuvastatin (CRESTOR) 10 MG tablet Take 10 mg by mouth daily. Patient not taking: Reported on 08/21/2019    [provider]  sertraline (ZOLOFT) 100 MG tablet Take 100 mg by mouth daily. 04/05/19   [provider]  Sertraline HCl (ZOLOFT PO) Take by mouth.    [provider]  Tiotropium Bromide Monohydrate (SPIRIVA RESPIMAT) 2.5 MCG/ACT AERS Inhale 2 puffs one daily 08/21/19   Garnet Sierras, DO  triamcinolone (KENALOG) 0.1 % cream Apply topically 2 (two) times daily. Reported on 06/17/2015    [provider]    Physical Exam: Vitals:   11/22/19 1141 11/22/19 1142 11/22/19 1433  BP: 115/76  122/62  Pulse: 89  70  Resp: 18  18  Temp: 98.3 F (36.8 C)    TempSrc: Oral    SpO2: 98%  96%  Weight:  86.2 kg   Height:  5\' 6"  (1.676 m)     Constitutional: NAD, calm, comfortable Vitals:   11/22/19 1141 11/22/19 1142 11/22/19 1433  BP: 115/76  122/62  Pulse: 89  70  Resp: 18  18  Temp: 98.3 F (36.8 C)    TempSrc: Oral    SpO2: 98%  96%  Weight:  86.2 kg   Height:  5\' 6"  (1.676 m)    General: WDWN, Alert and oriented x3.  Eyes: EOMI, PERRL, lids and conjunctivae normal.  Sclera nonicteric HENT:  [place]/AT, external ears normal.  Nares patent with nasal mucosa swelling and without epistasis.  Mucous membranes are moist. Posterior pharynx clear of any exudate or lesions. Normal dentition.  Neck: Soft, normal range of motion, supple, no masses, no thyromegaly.  Trachea midline Respiratory:  Equal breath sounds with diminished breath sounds in bases. Has bilateral rales. no wheezing, no crackles. Normal respiratory effort. No accessory muscle use.  Cardiovascular: Regular rate and rhythm, no murmurs / rubs / gallops. No extremity edema. 2+ pedal pulses.  Abdomen: Soft, no tenderness, nondistended, no rebound or guarding.  No masses palpated. Bowel sounds normoactive Musculoskeletal: FROM. no clubbing / cyanosis. No joint deformity upper and lower extremities.  no contractures. Normal muscle tone.  Skin: Warm, dry, intact no rashes, lesions, ulcers. No induration Neurologic: CN 2-12 grossly intact.  Normal speech.  Sensation intact, patella DTR +1 bilaterally. Strength 5/5 in all extremities.   Psychiatric: Normal judgment and insight.  Normal mood.    Labs  on Admission: I have personally reviewed following labs and imaging studies  CBC: Recent Labs  Lab 11/22/19 1202 11/22/19 1830  WBC 4.1 4.6  NEUTROABS  --  1.8  HGB 14.8 14.3  HCT 46.0 45.2  MCV 92.0 94.2  PLT 167 195    Basic Metabolic Panel: Recent Labs  Lab 11/22/19 1349  NA 132*  K 3.5  CL 95*  CO2 26  GLUCOSE 155*  BUN 13  CREATININE 1.17  CALCIUM 10.0    GFR: Estimated Creatinine Clearance: 55.3 mL/min (by C-G formula based on SCr of 1.17 mg/dL).  Liver Function Tests: No results for input(s): AST, ALT, ALKPHOS, BILITOT, PROT, ALBUMIN in the last 168 hours.  Urine analysis: No results found for: COLORURINE, APPEARANCEUR, [place], [place], [place], [place], BILIRUBINUR, KETONESUR, PROTEINUR, UROBILINOGEN, NITRITE, LEUKOCYTESUR  Radiological Exams on Admission: DG Chest Portable 1 View  Result Date: 11/22/2019 CLINICAL DATA:  COVID-19 positive EXAM: PORTABLE CHEST 1 VIEW COMPARISON:  July 06, 2017 FINDINGS: There is subtle ill-defined opacity in the right mid lung and left base regions. Lungs elsewhere clear. Heart size and pulmonary vascularity are normal. No adenopathy. No bone lesions. IMPRESSION: Subtle airspace opacity right mid lung and left base, likely representing foci of atypical organism pneumonia. No consolidation. Lungs otherwise clear. Cardiac silhouette normal. No adenopathy. Electronically Signed   By: Lowella Grip III M.D.   On: 11/22/2019 12:41    EKG: Independently reviewed.  EKG shows normal sinus rhythm with no acute ST elevation or depression.  QTc 435  Assessment/Plan   Pneumonia due to COVID-19 virus Mr. Boettner will be admitted to  [facility] floor on COVID-19 precautions and protocols.  He is started on days of year and Solu-Medrol per Covid protocol. Continued on oxygen by nasal cannula to keep O2 sat between 92 to 96%. Antitussives provided as needed.    Hypoxia Oxygen supplementation as needed to keep O2 sat 89 to 96%.  Patient is currently on 2 L via nasal cannula.    Essential hypertension Antihypertensive medications will be verified and reconciled by pharmacy and resumed.  Monitor blood pressure    COPD (chronic obstructive pulmonary disease) ([place]) She uses Symbicort twice at home and will be changed to [facility] twice a day per hospital formulary.  Albuterol as needed for wheezing cough, shortness of breath.    Type 2 diabetes mellitus without complication ([place]) Sugars will be monitored before every meal and nightly.  Sliding scale insulin provided as needed.  Home dose of metformin will be continued.    OSA on CPAP Chronic.  CPAP will be used if placed in negative pressure room    DVT prophylaxis: Lovenox for DVT prophylaxis Code Status:   Full code Family Communication:  Diagnosis plan discussed with patient.  Patient verbalized understanding agrees with plan.  Further recommendations to follow as clinically indicated Disposition Plan:   Patient is from:  Home  Anticipated DC to:  Home  Anticipated DC date:  Anticipate greater than 2 midnight stay in the hospital to treat acute medical condition  Anticipated DC barriers: No barriers to discharge identified at this time   Admission status:  Inpatient  Severity of Illness: The appropriate patient status for this patient is INPATIENT. Inpatient status is judged to be reasonable and necessary in order to provide the required intensity of service to ensure the patient's safety. The patient's presenting symptoms, physical exam findings, and initial radiographic and laboratory data in the context of their chronic comorbidities is felt to place  them at high risk  for further clinical deterioration. Furthermore, it is not anticipated that the patient will be medically stable for discharge from the hospital within 2 midnights of admission. The following factors support the patient status of inpatient.   * I certify that at the point of admission it is my clinical judgment that the patient will require inpatient hospital care spanning beyond 2 midnights from the point of admission due to high intensity of service, high risk for further deterioration and high frequency of surveillance required.Yevonne Aline Atara Paterson MD [facility]  How to contact the [facility] Attending or Consulting provider [place] or covering provider during after hours [place], for this patient?   1. Check the care team in [facility] and look for a) attending/consulting [facility] provider listed and b) the [facility] team listed 2. Log into [email or web address] and use [place]'s universal password to access. If you do not have the password, please contact the hospital operator. 3. Locate the [facility] provider you are looking for under [facility] and page to a number that you can be directly reached. 4. If you still have difficulty reaching the provider, please page the [facility] (Director on Call) for the Hospitalists listed on amion for assistance.  11/22/2019, 8:21 PM

## 2019-11-23 DIAGNOSIS — Z9989 Dependence on other enabling machines and devices: Secondary | ICD-10-CM

## 2019-11-23 DIAGNOSIS — G4733 Obstructive sleep apnea (adult) (pediatric): Secondary | ICD-10-CM

## 2019-11-23 DIAGNOSIS — J449 Chronic obstructive pulmonary disease, unspecified: Secondary | ICD-10-CM

## 2019-11-23 DIAGNOSIS — I10 Essential (primary) hypertension: Secondary | ICD-10-CM

## 2019-11-23 LAB — CBC
HCT: 46.5 % (ref 39.0–52.0)
Hemoglobin: 14.8 g/dL (ref 13.0–17.0)
MCH: 28.9 pg (ref 26.0–34.0)
MCHC: 31.8 g/dL (ref 30.0–36.0)
MCV: 90.8 fL (ref 80.0–100.0)
Platelets: 147 10*3/uL — ABNORMAL LOW (ref 150–400)
RBC: 5.12 MIL/uL (ref 4.22–5.81)
RDW: 13.9 % (ref 11.5–15.5)
WBC: 5.8 10*3/uL (ref 4.0–10.5)
nRBC: 0 % (ref 0.0–0.2)

## 2019-11-23 LAB — COMPREHENSIVE METABOLIC PANEL
ALT: 24 U/L (ref 0–44)
AST: 38 U/L (ref 15–41)
Albumin: 3.6 g/dL (ref 3.5–5.0)
Alkaline Phosphatase: 41 U/L (ref 38–126)
Anion gap: 11 (ref 5–15)
BUN: 12 mg/dL (ref 8–23)
CO2: 30 mmol/L (ref 22–32)
Calcium: 10.1 mg/dL (ref 8.9–10.3)
Chloride: 95 mmol/L — ABNORMAL LOW (ref 98–111)
Creatinine, Ser: 0.9 mg/dL (ref 0.61–1.24)
GFR calc Af Amer: >60 mL/min (ref >60)
GFR calc non Af Amer: >60 mL/min (ref >60)
Glucose, Bld: 135 mg/dL — ABNORMAL HIGH (ref 70–99)
Potassium: 3.4 mmol/L — ABNORMAL LOW (ref 3.5–5.1)
Sodium: 136 mmol/L (ref 135–145)
Total Bilirubin: 0.5 mg/dL (ref 0.3–1.2)
Total Protein: 6.7 g/dL (ref 6.5–8.1)

## 2019-11-23 LAB — GLUCOSE, CAPILLARY
Glucose-Capillary: 135 mg/dL — ABNORMAL HIGH (ref 70–99)
Glucose-Capillary: 161 mg/dL — ABNORMAL HIGH (ref 70–99)
Glucose-Capillary: 160 mg/dL — ABNORMAL HIGH (ref 70–99)
Glucose-Capillary: 172 mg/dL — ABNORMAL HIGH (ref 70–99)
Glucose-Capillary: 157 mg/dL — ABNORMAL HIGH (ref 70–99)

## 2019-11-23 LAB — LACTIC ACID, PLASMA: Lactic Acid, Venous: 1.4 mmol/L (ref 0.5–1.9)

## 2019-11-23 LAB — FERRITIN: Ferritin: 168 ng/mL (ref 24–336)

## 2019-11-23 LAB — C-REACTIVE PROTEIN: CRP: 2 mg/dL — ABNORMAL HIGH (ref <1.0)

## 2019-11-23 LAB — D-DIMER, QUANTITATIVE: D-Dimer, Quant: 1.24 ug/mL-FEU — ABNORMAL HIGH (ref 0.00–0.50)

## 2019-11-23 MED ORDER — TIOTROPIUM BROMIDE MONOHYDRATE 2.5 MCG/ACT IN AERS: 2.0000 | INHALATION_SPRAY | Freq: Every day | RESPIRATORY_TRACT | Status: DC

## 2019-11-23 MED ORDER — UMECLIDINIUM BROMIDE 62.5 MCG/INH IN AEPB
1.0000 | INHALATION_SPRAY | Freq: Every day | RESPIRATORY_TRACT | Status: DC
  Administered 2019-11-23 – 2019-11-24 (×2): 1 via RESPIRATORY_TRACT
  Filled 2019-11-23: qty 7

## 2019-11-23 MED ORDER — POTASSIUM CHLORIDE CRYS ER 20 MEQ PO TBCR
40.0000 meq | EXTENDED_RELEASE_TABLET | Freq: Once | ORAL | Status: AC
  Administered 2019-11-23: 40 meq via ORAL
  Filled 2019-11-23: qty 2

## 2019-11-23 MED ORDER — MONTELUKAST SODIUM 10 MG PO TABS
10.0000 mg | ORAL_TABLET | Freq: Every day | ORAL | Status: DC
  Administered 2019-11-23 – 2019-11-24 (×2): 10 mg via ORAL
  Filled 2019-11-23 (×2): qty 1

## 2019-11-23 NOTE — Telephone Encounter (Signed)
Called and spoke with the patient's daughter and she stated that he has been admitted to the hospital yesterday at [facility] for pneumonia but hopefully he will be discharged tomorrow. I did review that it was ok to take prednisone and to continue his other medications for his asthma. Patient's daughter verbalized understanding.

## 2019-11-23 NOTE — Progress Notes (Addendum)
PROGRESS NOTE                                                                                                                                                                                                             Patient Demographics:    Douglas Hansen, is a 74 y.o. male, DOB - 1945-06-11, MBW:[id number]  Outpatient Primary MD for the patient is [name], MD   Admit date - 11/22/2019   LOS - 1  Chief Complaint  Patient presents with  . Covid Positive  . Shortness of Breath  . Weakness       Brief Narrative: Patient is a 74 y.o. male with PMHx of asthma (on Biologics), OSA on CPAP, DM-2, HTN, HLD-diagnosed with COVID-19 on 9/13-presented with weakness and shortness of breath.  Found to have acute hypoxic respiratory failure due to COVID-19 pneumonia.  See below for further details.   COVID-19 vaccinated status: Vaccinated  Significant Events: 9/15>> Admit to [facility] for hypoxia due to COVID-19  Significant studies: 9/15>>Chest x-ray: Airspace opacity in the right midlung and left base  COVID-19 medications: Steroids:9/15>> Remdesivir: 9/15>>  Antibiotics: None  Microbiology data: 9/15>> blood culture: Pending  Procedures: None  Consults: None  DVT prophylaxis: enoxaparin (LOVENOX) injection 40 mg Start: 11/22/19 2245    Subjective:    [name] today feels better-he was titrated to room air today.  Still feels weak-he has not yet been ambulated.   Assessment  & Plan :   Acute Hypoxic Resp Failure due to Covid 19 Viral pneumonia: Improved-titrated down to room air-feels better but has not yet been ambulated-continues to complain of some weakness.  Plans are to continue with steroids/remdesivir-follow inflammatory markers-and see how he does with ambulation.  Fever: afebrile O2 requirements:  SpO2: 92 % O2 Flow Rate (L/min): 2 L/min   COVID-19 Labs: Recent Labs    11/22/19 1830  11/23/19 0100  DDIMER 0.97* 1.24*  FERRITIN  --  168  LDH 209*  --   CRP  --  2.0*    No results found for: BNP  Recent Labs  Lab 11/22/19 1830  PROCALCITON <0.10    Lab Results  Component Value Date   SARSCOV2NAA Detected (A) 11/20/2019     Prone/Incentive Spirometry: encouraged  incentive spirometry use 3-4/hour.  Bronchial asthma: No wheezing-recently started on mepolizumab.  On  bronchodilators.  HTN: BP controlled-continue manidipine, Avapro, Coreg.  DM-2: CBG stable-continue SSI  Recent Labs    11/23/19 0047 11/23/19 0753  GLUCAP 135* 161*   OSA: CPAP nightly   ABG:    Component Value Date/Time   TCO2 29 02/06/2012 1821    Vent Settings: N/A    Condition - Stable  Family Communication  :  Spouse ([name] [phone])-called-unable to leave voicemail-as mailbox was full.  Subsequently called daughter [name] [phone]) left a voicemail on 9/16  Addendum: Patient's daughter called back-I spoke to her over the phone.  Code Status :  Full Code  Diet :  Diet Order            Diet heart healthy/carb modified Room service appropriate? Yes; Fluid consistency: Thin  Diet effective now                  Disposition Plan  :   Status is: Inpatient  Remains inpatient appropriate because:Inpatient level of care appropriate due to severity of illness   Dispo: The patient is from: Home              Anticipated d/c is to: Home              Anticipated d/c date is: 2 days              Patient currently is not medically stable to d/c.   Barriers to discharge: Hypoxia requiring O2 supplementation/complete 5 days of IV Remdesivir  Antimicorbials  :    Anti-infectives (From admission, onward)   Start     Dose/Rate Route Frequency Ordered Stop   11/23/19 1000  remdesivir 100 mg in sodium chloride 0.9 % 100 mL IVPB  Status:  Discontinued       "Followed by" Linked Group Details   100 mg 200 mL/hr over 30 Minutes Intravenous Daily 11/22/19 2235  11/22/19 2238   11/23/19 1000  remdesivir 100 mg in sodium chloride 0.9 % 100 mL IVPB       "Followed by" Linked Group Details   100 mg 200 mL/hr over 30 Minutes Intravenous Daily 11/22/19 2042 11/27/19 0959   11/22/19 2235  remdesivir 200 mg in sodium chloride 0.9% 250 mL IVPB  Status:  Discontinued       "Followed by" Linked Group Details   200 mg 580 mL/hr over 30 Minutes Intravenous Once 11/22/19 2235 11/22/19 2238   11/22/19 2100  remdesivir 200 mg in sodium chloride 0.9% 250 mL IVPB       "Followed by" Linked Group Details   200 mg 580 mL/hr over 30 Minutes Intravenous Once 11/22/19 2042 11/23/19 0025      Inpatient Medications  Scheduled Meds: . amLODipine  10 mg Oral Daily   And  . irbesartan  300 mg Oral Daily  . aspirin EC  325 mg Oral Daily  . carvedilol  3.125 mg Oral BID  . enoxaparin (LOVENOX) injection  40 mg Subcutaneous QHS  . insulin aspart  0-15 Units Subcutaneous TID WC  . insulin aspart  0-5 Units Subcutaneous QHS  . methylPREDNISolone (SOLU-MEDROL) injection  1 mg/kg Intravenous Q12H   Followed by  . [START ON 11/26/2019] predniSONE  50 mg Oral Daily  . mometasone-formoterol  2 puff Inhalation BID   Continuous Infusions: . lactated ringers 10 mL/hr at 11/23/19 0738  . remdesivir 100 mg in NS 100 mL 100 mg (11/23/19 0923)   PRN Meds:.albuterol, guaiFENesin-dextromethorphan, ondansetron **OR** ondansetron (ZOFRAN) IV,  senna-docusate   Time Spent in minutes  25   See all Orders from today for further details   Oren Binet M.D on 11/23/2019 at 11:39 AM  To page go to [email or web address] - use universal password  [facility] -  Office  [phone]    Objective:   Vitals:   11/23/19 0013 11/23/19 0402 11/23/19 0753 11/23/19 1137  BP: (!) 156/73 126/71 118/68 118/64  Pulse: 90 99 79 65  Resp: 20 (!) 21 15 20   Temp: 98.4 F (36.9 C) 98.6 F (37 C) 98.2 F (36.8 C) 97.7 F (36.5 C)  TempSrc: Oral Oral Oral Oral  SpO2: 95% 94% 95% 92%   Weight: 82.3 kg     Height: 5\' 6"  (1.676 m)       Wt Readings from Last 3 Encounters:  11/23/19 82.3 kg  10/25/19 88.4 kg  08/21/19 89.2 kg     Intake/Output Summary (Last 24 hours) at 11/23/2019 1139 Last data filed at 11/23/2019 1100 Gross per 24 hour  Intake 1176 ml  Output --  Net 1176 ml     Physical Exam Gen Exam:Alert awake-not in any distress HEENT:atraumatic, normocephalic Chest: B/L clear to auscultation anteriorly CVS:S1S2 regular Abdomen:soft non tender, non distended Extremities:no edema Neurology: Non focal Skin: no rash   Data Review:    CBC Recent Labs  Lab 11/22/19 1202 11/22/19 1830 11/23/19 0100  WBC 4.1 4.6 5.8  HGB 14.8 14.3 14.8  HCT 46.0 45.2 46.5  PLT 167 163 147*  MCV 92.0 94.2 90.8  MCH 29.6 29.8 28.9  MCHC 32.2 31.6 31.8  RDW 14.3 14.3 13.9  LYMPHSABS  --  2.3  --   MONOABS  --  0.6  --   EOSABS  --  0.0  --   BASOSABS  --  0.0  --     Chemistries  Recent Labs  Lab 11/22/19 1349 11/23/19 0100  NA 132* 136  K 3.5 3.4*  CL 95* 95*  CO2 26 30  GLUCOSE 155* 135*  BUN 13 12  CREATININE 1.17 0.90  CALCIUM 10.0 10.1  AST  --  38  ALT  --  24  ALKPHOS  --  41  BILITOT  --  0.5   ------------------------------------------------------------------------------------------------------------------ Recent Labs    11/22/19 1830  TRIG 223*    No results found for: HGBA1C ------------------------------------------------------------------------------------------------------------------ No results for input(s): TSH, T4TOTAL, T3FREE, THYROIDAB in the last 72 hours.  Invalid input(s): FREET3 ------------------------------------------------------------------------------------------------------------------ Recent Labs    11/23/19 0100  FERRITIN 168    Coagulation profile No results for input(s): INR, PROTIME in the last 168 hours.  Recent Labs    11/22/19 1830 11/23/19 0100  DDIMER 0.97* 1.24*    Cardiac  Enzymes No results for input(s): CKMB, TROPONINI, MYOGLOBIN in the last 168 hours.  Invalid input(s): CK ------------------------------------------------------------------------------------------------------------------ No results found for: BNP  Micro Results Recent Results (from the past 240 hour(s))  Novel Coronavirus, NAA ([facility])     Status: Abnormal   Collection Time: 11/20/19 11:39 AM   Specimen: Nasopharyngeal(NP) swabs in vial transport medium   Nasopharynge  Patient  Result Value Ref Range Status   SARS-CoV-2, NAA Detected (A) Not Detected Final    Comment: Patients who have a positive COVID-19 test result may now have treatment options. Treatment options are available for patients with mild to moderate symptoms and for hospitalized patients. Visit our website at [email or web address] for resources and information. This nucleic acid amplification test was developed and its performance characteristics  determined by [employer]. Nucleic acid amplification tests include RT-PCR and TMA. This test has not been FDA cleared or approved. This test has been authorized by FDA under an Emergency Use Authorization (EUA). This test is only authorized for the duration of time the declaration that circumstances exist justifying the authorization of the emergency use of in vitro diagnostic tests for detection of SARS-CoV-2 virus and/or diagnosis of COVID-19 infection under section 564(b)(1) of the Act, 21 U.S.C. 947SJG-2(E) (1), unless the authorization is terminated or revoked sooner. When diagnostic testing is negativ e, the possibility of a false negative result should be considered in the context of a patient's recent exposures and the presence of clinical signs and symptoms consistent with COVID-19. An individual without symptoms of COVID-19 and who is not shedding SARS-CoV-2 virus would expect to have a negative (not detected) result in this assay.    SARS-COV-2, NAA 2 DAY TAT     Status: None   Collection Time: 11/20/19 11:39 AM   Nasopharynge  Patient  Result Value Ref Range Status   SARS-CoV-2, NAA 2 DAY TAT Performed  Final    Radiology Reports DG Chest Portable 1 View  Result Date: 11/22/2019 CLINICAL DATA:  COVID-19 positive EXAM: PORTABLE CHEST 1 VIEW COMPARISON:  July 06, 2017 FINDINGS: There is subtle ill-defined opacity in the right mid lung and left base regions. Lungs elsewhere clear. Heart size and pulmonary vascularity are normal. No adenopathy. No bone lesions. IMPRESSION: Subtle airspace opacity right mid lung and left base, likely representing foci of atypical organism pneumonia. No consolidation. Lungs otherwise clear. Cardiac silhouette normal. No adenopathy. Electronically Signed   By: Lowella Grip III M.D.   On: 11/22/2019 12:41

## 2019-11-23 NOTE — Progress Notes (Signed)
Pt states he does not use as cpap.

## 2019-11-23 NOTE — TOC Initial Note (Signed)
Transition of Care [facility]) - Initial/Assessment Note    Patient Details  Name: Douglas Hansen MRN: [id number] Date of Birth: 1945-08-28  Transition of [facility]) CM/SW Contact:    Verdell Carmine, RN Phone Number: 11/23/2019, 8:57 AM  Clinical Narrative:                  Admit for COVID,  Oxygen in use at 2LPM currently.  Not currently receiving any services at home. Lives with wife, [employer]. CM will follow for needs  Expected Discharge Plan: Home/Self Care Barriers to Discharge: Continued Medical Work up   Patient Goals and CMS Choice        Expected Discharge Plan and Services Expected Discharge Plan: Home/Self Care       Living arrangements for the past 2 months: Single Family Home                                      Prior Living Arrangements/Services Living arrangements for the past 2 months: Single Family Home Lives with:: Spouse Patient language and need for interpreter reviewed:: Yes        Need for Family Participation in Patient Care: Yes (Comment) Care giver support system in place?: Yes (comment)   Criminal Activity/Legal Involvement Pertinent to Current Situation/Hospitalization: No - Comment as needed  Activities of Daily Living Home Assistive Devices/Equipment: None ADL Screening (condition at time of admission) Patient's cognitive ability adequate to safely complete daily activities?: Yes Is the patient deaf or have difficulty hearing?: No Does the patient have difficulty seeing, even when wearing glasses/contacts?: No Does the patient have difficulty concentrating, remembering, or making decisions?: No Patient able to express need for assistance with ADLs?: Yes Does the patient have difficulty dressing or bathing?: No Independently performs ADLs?: Yes (appropriate for developmental age) Does the patient have difficulty walking or climbing stairs?: No Weakness of Legs: None Weakness of Arms/Hands: None  Permission  Sought/Granted                  Emotional Assessment       Orientation: : Oriented to Place, Oriented to Self, Oriented to  Time, Oriented to Situation Alcohol / Substance Use: Not Applicable Psych Involvement: No (comment)  Admission diagnosis:  Pneumonia due to COVID-19 virus [U07.1, J12.82] Patient Active Problem List   Diagnosis Date Noted  . Type 2 diabetes mellitus without complication ([place]) 92/42/6834  . Hypoxia 11/22/2019  . Pneumonia due to COVID-19 virus 11/22/2019  . Dyslipidemia 06/03/2016  . Other fatigue 06/03/2016  . Claudication ([place]) 06/03/2016  . Acute sinusitis 12/16/2015  . Asthma with acute exacerbation 06/17/2015  . Acute bronchitis 06/17/2015  . Gastroesophageal reflux disease without esophagitis 06/17/2015  . Shortness of breath 04/17/2015  . Acute pulmonary edema ([place]) 04/17/2015  . Chronic obstructive pulmonary disease, unspecified copd, unspecified chronic bronchitis type 01/07/2015  . Moderate persistent asthma 01/07/2015  . Other allergic rhinitis 01/07/2015  . Obstructive sleep apnea treated with continuous positive airway pressure (CPAP) 01/07/2015  . Asthma-COPD overlap syndrome (HCC)     Class: Chronic  . Pneumonia due to other gram-negative bacteria ([place]) 01/30/2011  . Cough 06/09/2010  . COPD (chronic obstructive pulmonary disease) ([place]) 06/09/2010  . OSA on CPAP   . Diabetes mellitus   . Hyperlipemia   . Essential hypertension    PCP:  [name], MD Pharmacy:   [facility] [place], [place] -  [address] [place] [address] Phone: [phone] Fax: [phone]     Social Determinants of Health (SDOH) Interventions    Readmission Risk Interventions No flowsheet data found.

## 2019-11-24 DIAGNOSIS — R0902 Hypoxemia: Secondary | ICD-10-CM

## 2019-11-24 LAB — COMPREHENSIVE METABOLIC PANEL
ALT: 22 U/L (ref 0–44)
AST: 29 U/L (ref 15–41)
Albumin: 3 g/dL — ABNORMAL LOW (ref 3.5–5.0)
Alkaline Phosphatase: 37 U/L — ABNORMAL LOW (ref 38–126)
Anion gap: 8 (ref 5–15)
BUN: 16 mg/dL (ref 8–23)
CO2: 30 mmol/L (ref 22–32)
Calcium: 10.4 mg/dL — ABNORMAL HIGH (ref 8.9–10.3)
Chloride: 99 mmol/L (ref 98–111)
Creatinine, Ser: 0.72 mg/dL (ref 0.61–1.24)
GFR calc Af Amer: >60 mL/min (ref >60)
GFR calc non Af Amer: >60 mL/min (ref >60)
Glucose, Bld: 175 mg/dL — ABNORMAL HIGH (ref 70–99)
Potassium: 3.9 mmol/L (ref 3.5–5.1)
Sodium: 137 mmol/L (ref 135–145)
Total Bilirubin: 0.6 mg/dL (ref 0.3–1.2)
Total Protein: 5.9 g/dL — ABNORMAL LOW (ref 6.5–8.1)

## 2019-11-24 LAB — GLUCOSE, CAPILLARY
Glucose-Capillary: 182 mg/dL — ABNORMAL HIGH (ref 70–99)
Glucose-Capillary: 148 mg/dL — ABNORMAL HIGH (ref 70–99)

## 2019-11-24 LAB — CBC
HCT: 40.8 % (ref 39.0–52.0)
Hemoglobin: 13.1 g/dL (ref 13.0–17.0)
MCH: 28.8 pg (ref 26.0–34.0)
MCHC: 32.1 g/dL (ref 30.0–36.0)
MCV: 89.7 fL (ref 80.0–100.0)
Platelets: 158 10*3/uL (ref 150–400)
RBC: 4.55 MIL/uL (ref 4.22–5.81)
RDW: 13.6 % (ref 11.5–15.5)
WBC: 2.8 10*3/uL — ABNORMAL LOW (ref 4.0–10.5)
nRBC: 0 % (ref 0.0–0.2)

## 2019-11-24 LAB — C-REACTIVE PROTEIN: CRP: 2.4 mg/dL — ABNORMAL HIGH (ref <1.0)

## 2019-11-24 LAB — D-DIMER, QUANTITATIVE: D-Dimer, Quant: 0.79 ug/mL-FEU — ABNORMAL HIGH (ref 0.00–0.50)

## 2019-11-24 MED ORDER — PREDNISONE 10 MG PO TABS: ORAL_TABLET | ORAL | 0 refills | Status: DC

## 2019-11-24 MED ORDER — BENZONATATE 100 MG PO CAPS: 100.0000 mg | ORAL_CAPSULE | Freq: Four times a day (QID) | ORAL | 0 refills | Status: DC | PRN

## 2019-11-24 NOTE — Progress Notes (Signed)
SATURATION QUALIFICATIONS: (This note is used to comply with regulatory documentation for home oxygen)  Patient Saturations on Room Air at Rest = 95%  Patient Saturations on Room Air while Ambulating = 92%  Patient Saturations on 0 Liters of oxygen while Ambulating = not tested  Please briefly explain why patient needs home oxygen: pt maintained o2 saturations greater than 92% while ambulating.

## 2019-11-24 NOTE — Discharge Instructions (Signed)
Patient scheduled for outpatient Remdesivir infusions at 11:30am on Saturday and Sunday at [facility]. Please inform the patient to park at [place], as staff will be escorting the patient through the [place] entrance of the hospital. Appointments take approximately 45 minutes.    There is a wave flag banner located near the entrance on [address]. Turn into this entrance and immediately turn left and park in 1 of the 5 designated Covid Infusion Parking spots. There is a phone number on the sign, please call and let the staff know what spot you are in and we will come out and get you. For questions call [phone].  Thanks.

## 2019-11-24 NOTE — Progress Notes (Signed)
Nsg Discharge Note  Admit Date:  11/22/2019 Discharge date: 11/24/2019   Douglas Hansen to be D/C'd Home per MD order.  AVS completed.     Discharge Medication: Allergies as of 11/24/2019      Reactions   Codeine Nausea Only, Other (See Comments)   GI upset   Lipitor [atorvastatin] Other (See Comments)   "Made my stomach burn"   Pravachol [pravastatin Sodium] Other (See Comments)   Caused joint pain      Medication List    STOP taking these medications   azelastine 0.1 % nasal spray Commonly known as: ASTELIN   clindamycin-benzoyl peroxide gel Commonly known as: BENZACLIN   clotrimazole 1 % cream Commonly known as: LOTRIMIN   guaifenesin 400 MG Tabs tablet Commonly known as: HUMIBID E   methocarbamol 500 MG tablet Commonly known as: ROBAXIN   triamcinolone cream 0.1 % Commonly known as: KENALOG     TAKE these medications   albuterol 108 (90 Base) MCG/ACT inhaler Commonly known as: VENTOLIN HFA INHALE 2 PUFFS BY MOUTH EVERY 4 HOURS AS NEEDED FOR WHEEZING OR SHORTNESS OF BREATH What changed: See the new instructions.   amLODipine 10 MG tablet Commonly known as: NORVASC Take 10 mg by mouth daily.   ascorbic acid 500 MG tablet Commonly known as: VITAMIN C Take 1,000 mg by mouth daily. What changed: Another medication with the same name was removed. Continue taking this medication, and follow the directions you see here.   aspirin EC 325 MG tablet Take 325 mg by mouth daily.   benzonatate 100 MG capsule Commonly known as: Tessalon Perles Take 1 capsule (100 mg total) by mouth every 6 (six) hours as needed for cough. What changed:   medication strength  how much to take  when to take this  reasons to take this   budesonide-formoterol 80-4.5 MCG/ACT inhaler Commonly known as: SYMBICORT Inhale 2 puffs into the lungs 2 (two) times daily. What changed: Another medication with the same name was removed. Continue taking this medication, and follow the  directions you see here.   carvedilol 12.5 MG tablet Commonly known as: COREG Take 12.5 mg by mouth 2 (two) times daily with a meal. What changed: Another medication with the same name was removed. Continue taking this medication, and follow the directions you see here.   Cellulose Carmellose Sodium Powd Apply 1 application topically daily as needed (irritation). Apply to feet   ciclesonide 80 MCG/ACT inhaler Commonly known as: ALVESCO Inhale 2 puffs into the lungs 2 (two) times daily.   clobetasol ointment 0.05 % Commonly known as: TEMOVATE Apply 1 application topically 2 (two) times daily as needed (to affected area). What changed: Another medication with the same name was removed. Continue taking this medication, and follow the directions you see here.   Diclofenac Sodium 1.5 % Soln Apply 4 drops topically 4 (four) times daily as needed (as directed- to affected area).   doxazosin 1 MG tablet Commonly known as: CARDURA Take 1 mg by mouth daily.   EPINEPHrine 0.3 mg/0.3 mL Soaj injection Commonly known as: EPI-PEN Inject 0.3 mLs (0.3 mg total) into the muscle as needed for anaphylaxis.   Eylea 2 MG/0.05ML Soln Generic drug: Aflibercept Place into the left eye every 30 (thirty) days. INJECTION   Fluocinolone Acetonide Body 0.01 % Oil Apply 1 application topically See admin instructions. Apply a small amount to affected area daily   Flutter Devi Use as directed   furosemide 20 MG tablet Commonly known as:  LASIX Take 20 mg by mouth in the morning. What changed: Another medication with the same name was removed. Continue taking this medication, and follow the directions you see here.   ketoconazole 2 % shampoo Commonly known as: NIZORAL Apply 1 application topically 3 (three) times a week. What changed: Another medication with the same name was removed. Continue taking this medication, and follow the directions you see here.   metFORMIN 500 MG tablet Commonly known  as: GLUCOPHAGE Take 250 mg by mouth 2 (two) times daily as needed (for elevated BGL). What changed: Another medication with the same name was removed. Continue taking this medication, and follow the directions you see here.   montelukast 10 MG tablet Commonly known as: SINGULAIR Take 10 mg by mouth at bedtime.   MULTIVITAMIN & MINERAL PO Take 1 tablet by mouth daily. Reported on 06/17/2015   Nucala 100 MG/ML Sosy Generic drug: mepolizumab Inject 100 mg into the skin every 28 (twenty-eight) days.   omeprazole 20 MG capsule Commonly known as: PRILOSEC Take 20 mg by mouth daily before breakfast.   OneTouch Delica Lancets 25K Misc daily.   OneTouch Ultra test strip Generic drug: glucose blood as needed.   predniSONE 10 MG tablet Commonly known as: DELTASONE Take 4 tablets (40 mg) daily for 2 days, then, Take 3 tablets (30 mg) daily for 2 days, then, Take 2 tablets (20 mg) daily for 2 days, then, Take 1 tablets (10 mg) daily for 1 days, then stop   rosuvastatin 20 MG tablet Commonly known as: CRESTOR Take 10 mg by mouth at bedtime. What changed: Another medication with the same name was removed. Continue taking this medication, and follow the directions you see here.   sertraline 100 MG tablet Commonly known as: ZOLOFT Take 100 mg by mouth daily. What changed: Another medication with the same name was removed. Continue taking this medication, and follow the directions you see here.   simethicone 80 MG chewable tablet Commonly known as: MYLICON Chew 80 mg by mouth 2 (two) times daily as needed for flatulence. What changed: Another medication with the same name was removed. Continue taking this medication, and follow the directions you see here.   Spiriva Respimat 2.5 MCG/ACT Aers Generic drug: Tiotropium Bromide Monohydrate Inhale 2 puffs one daily What changed:   how much to take  how to take this  when to take this  additional instructions   VITAMIN D-3 PO Take 1  capsule by mouth daily. What changed: Another medication with the same name was removed. Continue taking this medication, and follow the directions you see here.       Discharge Assessment: Vitals:   11/24/19 0428 11/24/19 0750  BP: 124/69   Pulse: 75 73  Resp: 20 20  Temp: 98.2 F (36.8 C)   SpO2: 92% 90%   Skin clean, dry and intact without evidence of skin break down, no evidence of skin tears noted. IV catheter discontinued intact. Site without signs and symptoms of complications - no redness or edema noted at insertion site, patient denies c/o pain - only slight tenderness at site.  Dressing with slight pressure applied.  D/c Instructions-Education: Discharge instructions given to patient/family with verbalized understanding. D/c education completed with patient/family including follow up instructions, medication list, d/c activities limitations if indicated, with other d/c instructions as indicated by MD - patient able to verbalize understanding, all questions fully answered. Patient instructed to return to ED, call 911, or call MD for any changes in  condition.  Patient escorted via [place], and D/C home via private auto. Patient educated about scheduled Remdesivir infusion clinic set up for Saturday and Sunday. Information highlighted on AVS for patient to refer to.  Hiram Comber, RN 11/24/2019 1:37 PM

## 2019-11-24 NOTE — Progress Notes (Addendum)
Patient scheduled for outpatient Remdesivir infusions at 11:30am on Saturday and Sunday at [facility]. Please inform the patient to park at [place], as staff will be escorting the patient through the [place] entrance of the hospital. Appointments take approximately 45 minutes.    There is a wave flag banner located near the entrance on [address]. Turn into this entrance and immediately turn left and park in 1 of the 5 designated Covid Infusion Parking spots. There is a phone number on the sign, please call and let the staff know what spot you are in and we will come out and get you. For questions call [phone].  Thanks.

## 2019-11-24 NOTE — Progress Notes (Signed)
Patient's belongings retrieved from security and returned to patient prior to discharge.

## 2019-11-24 NOTE — Discharge Summary (Signed)
PATIENT DETAILS Name: Douglas Hansen Age: 74 y.o. Sex: male Date of Birth: 1945/05/21 MRN: [id number]. Admitting Physician: Eben Burow, MD PCP:Kim, Jeneen Rinks, MD  Admit Date: 11/22/2019 Discharge date: 11/24/2019  Recommendations for Outpatient Follow-up:  1. Follow up with PCP in 1-2 weeks 2. Please obtain CMP/CBC in one week 3. Repeat Chest Xray in 4-6 week  Admitted From:  Home  Disposition: [place]: No  Equipment/Devices: None  Discharge Condition: Stable  CODE STATUS: FULL CODE  Diet recommendation:  Diet Order            Diet - low sodium heart healthy           Diet Carb Modified           Diet heart healthy/carb modified Room service appropriate? Yes; Fluid consistency: Thin  Diet effective now                 Brief Narrative: Patient is a 74 y.o. male with PMHx of asthma (on Biologics), OSA on CPAP, DM-2, HTN, HLD-diagnosed with COVID-19 on 9/13-presented with weakness and shortness of breath.  Found to have acute hypoxic respiratory failure due to COVID-19 pneumonia.  See below for further details.   COVID-19 vaccinated status: Vaccinated  Significant Events: 9/15>> Admit to [facility] for hypoxia due to COVID-19  Significant studies: 9/15>>Chest x-ray: Airspace opacity in the right midlung and left base  COVID-19 medications: Steroids:9/15>> Remdesivir: 9/15>>  Antibiotics: None  Microbiology data: 9/15>> blood culture: negative  Procedures: None  Consults: None  Brief Hospital Course: Acute Hypoxic Resp Failure due to Covid 19 Viral pneumonia:  Had very mild hypoxemia on presentation-rapidly improved with steroids and Remdesivir-has been on room air since yesterday.  He was ambulated in the room yesterday without any major issues-he again ambulated in the hallway today with nursing staff-O2 saturations were persistently above 92.  He feels better-and is anxious to leave the hospital-he will complete the remainder  of the 2 doses of Remdesivir at the infusion center tomorrow and the day after.  He will be discharged on tapering steroids.  He was instructed to seek immediate medical attention if in the unlikely event if his shortness of breath worsens.  Spoke with patient's daughter on the day of discharge as well.  COVID-19 Labs:  Recent Labs    11/22/19 1830 11/23/19 0100 11/24/19 0500  DDIMER 0.97* 1.24* 0.79*  FERRITIN  --  168  --   LDH 209*  --   --   CRP  --  2.0* 2.4*    Lab Results  Component Value Date   SARSCOV2NAA Detected (A) 11/20/2019     Bronchial asthma: No wheezing-recently started on mepolizumab.  On bronchodilators.  HTN: BP controlled-continue manidipine, Avapro, Coreg.  DM-2: CBG stable-continue SSI-resume usual diabetic regimen on discharge.  OSA: CPAP nightly  Obesity: Estimated body mass index is 30.81 kg/m as calculated from the following:   Height as of this encounter: 5\' 6"  (1.676 m).   Weight as of this encounter: 86.6 kg.    Discharge Diagnoses:  Active Problems:   OSA on CPAP   Essential hypertension   COPD (chronic obstructive pulmonary disease) (HCC)   Type 2 diabetes mellitus without complication ([place])   Hypoxia   Pneumonia due to COVID-19 virus   Discharge Instructions:    Person Under Monitoring Name: Douglas Hansen  Location: [place] [address]   Infection Prevention Recommendations for Individuals Confirmed to have, or Being  Evaluated for, 2019 Novel Coronavirus (COVID-19) Infection Who Receive Care at Home  Individuals who are confirmed to have, or are being evaluated for, COVID-19 should follow the prevention steps below until a healthcare provider or local or state health department says they can return to normal activities.  Stay home except to get medical care You should restrict activities outside your home, except for getting medical care. Do not go to work, school, or public areas, and do not use  public transportation or taxis.  Call ahead before visiting your doctor Before your medical appointment, call the healthcare provider and tell them that you have, or are being evaluated for, COVID-19 infection. This will help the healthcare provider's office take steps to keep other people from getting infected. Ask your healthcare provider to call the local or state health department.  Monitor your symptoms Seek prompt medical attention if your illness is worsening (e.g., difficulty breathing). Before going to your medical appointment, call the healthcare provider and tell them that you have, or are being evaluated for, COVID-19 infection. Ask your healthcare provider to call the local or state health department.  Wear a facemask You should wear a facemask that covers your nose and mouth when you are in the same room with other people and when you visit a healthcare provider. People who live with or visit you should also wear a facemask while they are in the same room with you.  Separate yourself from other people in your home As much as possible, you should stay in a different room from other people in your home. Also, you should use a separate bathroom, if available.  Avoid sharing household items You should not share dishes, drinking glasses, cups, eating utensils, towels, bedding, or other items with other people in your home. After using these items, you should wash them thoroughly with soap and water.  Cover your coughs and sneezes Cover your mouth and nose with a tissue when you cough or sneeze, or you can cough or sneeze into your sleeve. Throw used tissues in a lined trash can, and immediately wash your hands with soap and water for at least 20 seconds or use an alcohol-based hand rub.  Wash your [facility] your hands often and thoroughly with soap and water for at least 20 seconds. You can use an alcohol-based hand sanitizer if soap and water are not available and if your  hands are not visibly dirty. Avoid touching your eyes, nose, and mouth with unwashed hands.   Prevention Steps for Caregivers and Household Members of Individuals Confirmed to have, or Being Evaluated for, COVID-19 Infection Being Cared for in the Home  If you live with, or provide care at home for, a person confirmed to have, or being evaluated for, COVID-19 infection please follow these guidelines to prevent infection:  Follow healthcare provider's instructions Make sure that you understand and can help the patient follow any healthcare provider instructions for all care.  Provide for the patient's basic needs You should help the patient with basic needs in the home and provide support for getting groceries, prescriptions, and other personal needs.  Monitor the patient's symptoms If they are getting sicker, call his or her medical provider and tell them that the patient has, or is being evaluated for, COVID-19 infection. This will help the healthcare provider's office take steps to keep other people from getting infected. Ask the healthcare provider to call the local or state health department.  Limit the number of people who  have contact with the patient  If possible, have only one caregiver for the patient.  Other household members should stay in another home or place of residence. If this is not possible, they should stay  in another room, or be separated from the patient as much as possible. Use a separate bathroom, if available.  Restrict visitors who do not have an essential need to be in the home.  Keep older adults, very young children, and other sick people away from the patient Keep older adults, very young children, and those who have compromised immune systems or chronic health conditions away from the patient. This includes people with chronic heart, lung, or kidney conditions, diabetes, and cancer.  Ensure good ventilation Make sure that shared spaces in the home  have good air flow, such as from an air conditioner or an opened window, weather permitting.  Wash your hands often  Wash your hands often and thoroughly with soap and water for at least 20 seconds. You can use an alcohol based hand sanitizer if soap and water are not available and if your hands are not visibly dirty.  Avoid touching your eyes, nose, and mouth with unwashed hands.  Use disposable paper towels to dry your hands. If not available, use dedicated cloth towels and replace them when they become wet.  Wear a facemask and gloves  Wear a disposable facemask at all times in the room and gloves when you touch or have contact with the patient's blood, body fluids, and/or secretions or excretions, such as sweat, saliva, sputum, nasal mucus, vomit, urine, or feces.  Ensure the mask fits over your nose and mouth tightly, and do not touch it during use.  Throw out disposable facemasks and gloves after using them. Do not reuse.  Wash your hands immediately after removing your facemask and gloves.  If your personal clothing becomes contaminated, carefully remove clothing and launder. Wash your hands after handling contaminated clothing.  Place all used disposable facemasks, gloves, and other waste in a lined container before disposing them with other household waste.  Remove gloves and wash your hands immediately after handling these items.  Do not share dishes, glasses, or other household items with the patient  Avoid sharing household items. You should not share dishes, drinking glasses, cups, eating utensils, towels, bedding, or other items with a patient who is confirmed to have, or being evaluated for, COVID-19 infection.  After the person uses these items, you should wash them thoroughly with soap and water.  Wash laundry thoroughly  Immediately remove and wash clothes or bedding that have blood, body fluids, and/or secretions or excretions, such as sweat, saliva, sputum, nasal  mucus, vomit, urine, or feces, on them.  Wear gloves when handling laundry from the patient.  Read and follow directions on labels of laundry or clothing items and detergent. In general, wash and dry with the warmest temperatures recommended on the label.  Clean all areas the individual has used often  Clean all touchable surfaces, such as counters, tabletops, doorknobs, bathroom fixtures, toilets, phones, keyboards, tablets, and bedside tables, every day. Also, clean any surfaces that may have blood, body fluids, and/or secretions or excretions on them.  Wear gloves when cleaning surfaces the patient has come in contact with.  Use a diluted bleach solution (e.g., dilute bleach with 1 part bleach and 10 parts water) or a household disinfectant with a label that says EPA-registered for coronaviruses. To make a bleach solution at home, add 1 tablespoon of  bleach to 1 quart (4 cups) of water. For a larger supply, add  cup of bleach to 1 gallon (16 cups) of water.  Read labels of cleaning products and follow recommendations provided on product labels. Labels contain instructions for safe and effective use of the cleaning product including precautions you should take when applying the product, such as wearing gloves or eye protection and making sure you have good ventilation during use of the product.  Remove gloves and wash hands immediately after cleaning.  Monitor yourself for signs and symptoms of illness Caregivers and household members are considered close contacts, should monitor their health, and will be asked to limit movement outside of the home to the extent possible. Follow the monitoring steps for close contacts listed on the symptom monitoring form.   ? If you have additional questions, contact your local health department or call the epidemiologist on call at [phone] (available 24/7). ? This guidance is subject to change. For the most up-to-date guidance from CDC, please  refer to their website: YouBlogs.pl    Activity:  As tolerated   Discharge Instructions    Call MD for:  difficulty breathing, headache or visual disturbances   Complete by: As directed    Diet - low sodium heart healthy   Complete by: As directed    Diet Carb Modified   Complete by: As directed    Discharge instructions   Complete by: As directed    Follow with Primary MD  [name], MD in 1-2 weeks  21 days of isolation from 11/20/2019  If you develop worsening shortness of breath-please seek immediate medical attention  Please get a complete blood count and chemistry panel checked by your Primary MD at your next visit, and again as instructed by your Primary MD.  Get Medicines reviewed and adjusted: Please take all your medications with you for your next visit with your Primary MD  Laboratory/radiological data: Please request your Primary MD to go over all hospital tests and procedure/radiological results at the follow up, please ask your Primary MD to get all Hospital records sent to his/her office.  In some cases, they will be blood work, cultures and biopsy results pending at the time of your discharge. Please request that your primary care M.D. follows up on these results.  Also Note the following: If you experience worsening of your admission symptoms, develop shortness of breath, life threatening emergency, suicidal or homicidal thoughts you must seek medical attention immediately by calling 911 or calling your MD immediately  if symptoms less severe.  You must read complete instructions/literature along with all the possible adverse reactions/side effects for all the Medicines you take and that have been prescribed to you. Take any new Medicines after you have completely understood and accpet all the possible adverse reactions/side effects.   Do not drive when taking Pain medications or sleeping medications  (Benzodaizepines)  Do not take more than prescribed Pain, Sleep and Anxiety Medications. It is not advisable to combine anxiety,sleep and pain medications without talking with your primary care practitioner  Special Instructions: If you have smoked or chewed Tobacco  in the last 2 yrs please stop smoking, stop any regular Alcohol  and or any Recreational drug use.  Wear Seat belts while driving.  Please note: You were cared for by a hospitalist during your hospital stay. Once you are discharged, your primary care physician will handle any further medical issues. Please note that NO REFILLS for any discharge medications will be  authorized once you are discharged, as it is imperative that you return to your primary care physician (or establish a relationship with a primary care physician if you do not have one) for your post hospital discharge needs so that they can reassess your need for medications and monitor your lab values.   Patient scheduled for outpatient Remdesivir infusions at 11:30am on Saturday and Sunday at [facility]. Please inform the patient to park [place], [place], as staff will be escorting the patient through the [place] entrance of the hospital.Appointments take approximately 45 minutes.   There is a wave flag banner located near the entrance on [address]. Turn into this entranceand immediatelyturn left and park in 1 of the 5 designated Covid Infusion Parking spots. There is a phone number on the sign, please call and let the staff know what spot you are in and we will come out and get you. For questions call [phone]   Increase activity slowly   Complete by: As directed      Allergies as of 11/24/2019      Reactions   Codeine Nausea Only, Other (See Comments)   GI upset   Lipitor [atorvastatin] Other (See Comments)   "Made my stomach burn"   Pravachol [pravastatin Sodium] Other (See Comments)   Caused joint pain      Medication List     STOP taking these medications   azelastine 0.1 % nasal spray Commonly known as: ASTELIN   clindamycin-benzoyl peroxide gel Commonly known as: BENZACLIN   clotrimazole 1 % cream Commonly known as: LOTRIMIN   guaifenesin 400 MG Tabs tablet Commonly known as: HUMIBID E   methocarbamol 500 MG tablet Commonly known as: ROBAXIN   triamcinolone cream 0.1 % Commonly known as: KENALOG     TAKE these medications   albuterol 108 (90 Base) MCG/ACT inhaler Commonly known as: VENTOLIN HFA INHALE 2 PUFFS BY MOUTH EVERY 4 HOURS AS NEEDED FOR WHEEZING OR SHORTNESS OF BREATH What changed: See the new instructions.   amLODipine 10 MG tablet Commonly known as: NORVASC Take 10 mg by mouth daily.   ascorbic acid 500 MG tablet Commonly known as: VITAMIN C Take 1,000 mg by mouth daily. What changed: Another medication with the same name was removed. Continue taking this medication, and follow the directions you see here.   aspirin EC 325 MG tablet Take 325 mg by mouth daily.   benzonatate 100 MG capsule Commonly known as: Tessalon Perles Take 1 capsule (100 mg total) by mouth every 6 (six) hours as needed for cough. What changed:   medication strength  how much to take  when to take this  reasons to take this   budesonide-formoterol 80-4.5 MCG/ACT inhaler Commonly known as: SYMBICORT Inhale 2 puffs into the lungs 2 (two) times daily. What changed: Another medication with the same name was removed. Continue taking this medication, and follow the directions you see here.   carvedilol 12.5 MG tablet Commonly known as: COREG Take 12.5 mg by mouth 2 (two) times daily with a meal. What changed: Another medication with the same name was removed. Continue taking this medication, and follow the directions you see here.   Cellulose Carmellose Sodium Powd Apply 1 application topically daily as needed (irritation). Apply to feet   ciclesonide 80 MCG/ACT inhaler Commonly known as:  ALVESCO Inhale 2 puffs into the lungs 2 (two) times daily.   clobetasol ointment 0.05 % Commonly known as: TEMOVATE Apply 1 application topically 2 (  two) times daily as needed (to affected area). What changed: Another medication with the same name was removed. Continue taking this medication, and follow the directions you see here.   Diclofenac Sodium 1.5 % Soln Apply 4 drops topically 4 (four) times daily as needed (as directed- to affected area).   doxazosin 1 MG tablet Commonly known as: CARDURA Take 1 mg by mouth daily.   EPINEPHrine 0.3 mg/0.3 mL Soaj injection Commonly known as: EPI-PEN Inject 0.3 mLs (0.3 mg total) into the muscle as needed for anaphylaxis.   Eylea 2 MG/0.05ML Soln Generic drug: Aflibercept Place into the left eye every 30 (thirty) days. INJECTION   Fluocinolone Acetonide Body 0.01 % Oil Apply 1 application topically See admin instructions. Apply a small amount to affected area daily   Flutter Devi Use as directed   furosemide 20 MG tablet Commonly known as: LASIX Take 20 mg by mouth in the morning. What changed: Another medication with the same name was removed. Continue taking this medication, and follow the directions you see here.   ketoconazole 2 % shampoo Commonly known as: NIZORAL Apply 1 application topically 3 (three) times a week. What changed: Another medication with the same name was removed. Continue taking this medication, and follow the directions you see here.   metFORMIN 500 MG tablet Commonly known as: GLUCOPHAGE Take 250 mg by mouth 2 (two) times daily as needed (for elevated BGL). What changed: Another medication with the same name was removed. Continue taking this medication, and follow the directions you see here.   montelukast 10 MG tablet Commonly known as: SINGULAIR Take 10 mg by mouth at bedtime.   MULTIVITAMIN & MINERAL PO Take 1 tablet by mouth daily. Reported on 06/17/2015   Nucala 100 MG/ML Sosy Generic drug:  mepolizumab Inject 100 mg into the skin every 28 (twenty-eight) days.   omeprazole 20 MG capsule Commonly known as: PRILOSEC Take 20 mg by mouth daily before breakfast.   OneTouch Delica Lancets 20N Misc daily.   OneTouch Ultra test strip Generic drug: glucose blood as needed.   predniSONE 10 MG tablet Commonly known as: DELTASONE Take 4 tablets (40 mg) daily for 2 days, then, Take 3 tablets (30 mg) daily for 2 days, then, Take 2 tablets (20 mg) daily for 2 days, then, Take 1 tablets (10 mg) daily for 1 days, then stop   rosuvastatin 20 MG tablet Commonly known as: CRESTOR Take 10 mg by mouth at bedtime. What changed: Another medication with the same name was removed. Continue taking this medication, and follow the directions you see here.   sertraline 100 MG tablet Commonly known as: ZOLOFT Take 100 mg by mouth daily. What changed: Another medication with the same name was removed. Continue taking this medication, and follow the directions you see here.   simethicone 80 MG chewable tablet Commonly known as: MYLICON Chew 80 mg by mouth 2 (two) times daily as needed for flatulence. What changed: Another medication with the same name was removed. Continue taking this medication, and follow the directions you see here.   Spiriva Respimat 2.5 MCG/ACT Aers Generic drug: Tiotropium Bromide Monohydrate Inhale 2 puffs one daily What changed:   how much to take  how to take this  when to take this  additional instructions   VITAMIN D-3 PO Take 1 capsule by mouth daily. What changed: Another medication with the same name was removed. Continue taking this medication, and follow the directions you see here.  Follow-up Information    [name], MD. Schedule an appointment as soon as possible for a visit.   Specialty: Internal Medicine Contact information: [address] [place] [address] [phone]              Allergies  Allergen  Reactions  . Codeine Nausea Only and Other (See Comments)    GI upset  . Lipitor [Atorvastatin] Other (See Comments)    "Made my stomach burn"  . Pravachol [Pravastatin Sodium] Other (See Comments)    Caused joint pain    Other Procedures/Studies: DG Chest Portable 1 View  Result Date: 11/22/2019 CLINICAL DATA:  COVID-19 positive EXAM: PORTABLE CHEST 1 VIEW COMPARISON:  July 06, 2017 FINDINGS: There is subtle ill-defined opacity in the right mid lung and left base regions. Lungs elsewhere clear. Heart size and pulmonary vascularity are normal. No adenopathy. No bone lesions. IMPRESSION: Subtle airspace opacity right mid lung and left base, likely representing foci of atypical organism pneumonia. No consolidation. Lungs otherwise clear. Cardiac silhouette normal. No adenopathy. Electronically Signed   By: Lowella Grip III M.D.   On: 11/22/2019 12:41     TODAY-DAY OF DISCHARGE:  Subjective:   [name] today has no headache,no chest abdominal pain,no new weakness tingling or numbness, feels much better wants to go home today.   Objective:   Blood pressure 124/69, pulse 73, temperature 98.2 F (36.8 C), temperature source Oral, resp. rate 20, height 5\' 6"  (1.676 m), weight 86.6 kg, SpO2 90 %.  Intake/Output Summary (Last 24 hours) at 11/24/2019 1206 Last data filed at 11/24/2019 1000 Gross per 24 hour  Intake 480 ml  Output 1075 ml  Net -595 ml   Filed Weights   11/22/19 1142 11/23/19 0013 11/24/19 0428  Weight: 86.2 kg 82.3 kg 86.6 kg    Exam: Awake Alert, Oriented *3, No new F.N deficits, Normal affect Accident.AT,PERRAL Supple Neck,No JVD, No cervical lymphadenopathy appriciated.  Symmetrical Chest wall movement, Good air movement bilaterally, CTAB RRR,No Gallops,Rubs or new Murmurs, No Parasternal Heave +ve B.Sounds, Abd Soft, Non tender, No organomegaly appriciated, No rebound -guarding or rigidity. No Cyanosis, Clubbing or edema, No new Rash or bruise   PERTINENT  RADIOLOGIC STUDIES: DG Chest Portable 1 View  Result Date: 11/22/2019 CLINICAL DATA:  COVID-19 positive EXAM: PORTABLE CHEST 1 VIEW COMPARISON:  July 06, 2017 FINDINGS: There is subtle ill-defined opacity in the right mid lung and left base regions. Lungs elsewhere clear. Heart size and pulmonary vascularity are normal. No adenopathy. No bone lesions. IMPRESSION: Subtle airspace opacity right mid lung and left base, likely representing foci of atypical organism pneumonia. No consolidation. Lungs otherwise clear. Cardiac silhouette normal. No adenopathy. Electronically Signed   By: Lowella Grip III M.D.   On: 11/22/2019 12:41     PERTINENT LAB RESULTS: CBC: Recent Labs    11/23/19 0100 11/24/19 0500  WBC 5.8 2.8*  HGB 14.8 13.1  HCT 46.5 40.8  PLT 147* 158   CMET CMP     Component Value Date/Time   NA 137 11/24/2019 0500   K 3.9 11/24/2019 0500   CL 99 11/24/2019 0500   CO2 30 11/24/2019 0500   GLUCOSE 175 (H) 11/24/2019 0500   BUN 16 11/24/2019 0500   CREATININE 0.72 11/24/2019 0500   CALCIUM 10.4 (H) 11/24/2019 0500   PROT 5.9 (L) 11/24/2019 0500   ALBUMIN 3.0 (L) 11/24/2019 0500   AST 29 11/24/2019 0500   ALT 22 11/24/2019 0500   ALKPHOS  37 (L) 11/24/2019 0500   BILITOT 0.6 11/24/2019 0500   GFRNONAA >60 11/24/2019 0500   GFRAA >60 11/24/2019 0500    GFR Estimated Creatinine Clearance: 81 mL/min (by C-G formula based on SCr of 0.72 mg/dL). No results for input(s): LIPASE, AMYLASE in the last 72 hours. No results for input(s): CKTOTAL, CKMB, CKMBINDEX, TROPONINI in the last 72 hours. Invalid input(s): POCBNP Recent Labs    11/23/19 0100 11/24/19 0500  DDIMER 1.24* 0.79*   No results for input(s): HGBA1C in the last 72 hours. Recent Labs    11/22/19 1830  TRIG 223*   No results for input(s): TSH, T4TOTAL, T3FREE, THYROIDAB in the last 72 hours.  Invalid input(s): FREET3 Recent Labs    11/23/19 0100  FERRITIN 168   Coags: No results for input(s):  INR in the last 72 hours.  Invalid input(s): PT Microbiology: Recent Results (from the past 240 hour(s))  Novel Coronavirus, NAA ([facility])     Status: Abnormal   Collection Time: 11/20/19 11:39 AM   Specimen: Nasopharyngeal(NP) swabs in vial transport medium   Nasopharynge  Patient  Result Value Ref Range Status   SARS-CoV-2, NAA Detected (A) Not Detected Final    Comment: Patients who have a positive COVID-19 test result may now have treatment options. Treatment options are available for patients with mild to moderate symptoms and for hospitalized patients. Visit our website at [email or web address] for resources and information. This nucleic acid amplification test was developed and its performance characteristics determined by [employer]. Nucleic acid amplification tests include RT-PCR and TMA. This test has not been FDA cleared or approved. This test has been authorized by FDA under an Emergency Use Authorization (EUA). This test is only authorized for the duration of time the declaration that circumstances exist justifying the authorization of the emergency use of in vitro diagnostic tests for detection of SARS-CoV-2 virus and/or diagnosis of COVID-19 infection under section 564(b)(1) of the Act, 21 U.S.C. 366YQI-3(K) (1), unless the authorization is terminated or revoked sooner. When diagnostic testing is negativ e, the possibility of a false negative result should be considered in the context of a patient's recent exposures and the presence of clinical signs and symptoms consistent with COVID-19. An individual without symptoms of COVID-19 and who is not shedding SARS-CoV-2 virus would expect to have a negative (not detected) result in this assay.   SARS-COV-2, NAA 2 DAY TAT     Status: None   Collection Time: 11/20/19 11:39 AM   Nasopharynge  Patient  Result Value Ref Range Status   SARS-CoV-2, NAA 2 DAY TAT Performed  Final  Blood Culture (routine x  2)     Status: None (Preliminary result)   Collection Time: 11/22/19  6:41 PM   Specimen: BLOOD  Result Value Ref Range Status   Specimen Description BLOOD RIGHT ANTECUBITAL  Final   Special Requests   Final    BOTTLES DRAWN AEROBIC AND ANAEROBIC Blood Culture adequate volume   Culture   Final    NO GROWTH 2 DAYS Performed at [facility], [place] [address], [place],  [address]    Report Status PENDING  Incomplete  Blood Culture (routine x 2)     Status: None (Preliminary result)   Collection Time: 11/23/19  1:08 AM   Specimen: BLOOD  Result Value Ref Range Status   Specimen Description BLOOD RIGHT HAND  Final   Special Requests   Final    BOTTLES DRAWN AEROBIC ONLY Blood Culture results may  not be optimal due to an inadequate volume of blood received in culture bottles   Culture   Final    NO GROWTH 1 DAY Performed at [facility], [place] [address] [place], [place] [address]    Report Status PENDING  Incomplete    FURTHER DISCHARGE INSTRUCTIONS:  Get Medicines reviewed and adjusted: Please take all your medications with you for your next visit with your Primary MD  Laboratory/radiological data: Please request your Primary MD to go over all hospital tests and procedure/radiological results at the follow up, please ask your Primary MD to get all Hospital records sent to his/her office.  In some cases, they will be blood work, cultures and biopsy results pending at the time of your discharge. Please request that your primary care M.D. goes through all the records of your hospital data and follows up on these results.  Also Note the following: If you experience worsening of your admission symptoms, develop shortness of breath, life threatening emergency, suicidal or homicidal thoughts you must seek medical attention immediately by calling 911 or calling your MD immediately  if symptoms less severe.  You must read complete instructions/literature along with all the  possible adverse reactions/side effects for all the Medicines you take and that have been prescribed to you. Take any new Medicines after you have completely understood and accpet all the possible adverse reactions/side effects.   Do not drive when taking Pain medications or sleeping medications (Benzodaizepines)  Do not take more than prescribed Pain, Sleep and Anxiety Medications. It is not advisable to combine anxiety,sleep and pain medications without talking with your primary care practitioner  Special Instructions: If you have smoked or chewed Tobacco  in the last 2 yrs please stop smoking, stop any regular Alcohol  and or any Recreational drug use.  Wear Seat belts while driving.  Please note: You were cared for by a hospitalist during your hospital stay. Once you are discharged, your primary care physician will handle any further medical issues. Please note that NO REFILLS for any discharge medications will be authorized once you are discharged, as it is imperative that you return to your primary care physician (or establish a relationship with a primary care physician if you do not have one) for your post hospital discharge needs so that they can reassess your need for medications and monitor your lab values.  Total Time spent coordinating discharge including counseling, education and face to face time equals 35 minutes.  SignedOren Binet 11/24/2019 12:06 PM

## 2019-11-24 NOTE — TOC Transition Note (Signed)
Transition of Care [facility]) - CM/SW Discharge Note   Patient Details  Name: Douglas Hansen MRN: [id number] Date of Birth: 1945/05/18  Transition of Care [facility]) CM/SW Contact:  Verdell Carmine, RN Phone Number: 11/24/2019, 12:12 PM   Clinical Narrative:    Patient discharging, no oxygen needs, no other needs identified.    Final next level of care: Home/Self Care Barriers to Discharge: No Barriers Identified   Patient Goals and CMS Choice        Discharge Placement                       Discharge Plan and Services                                     Social Determinants of Health (SDOH) Interventions     Readmission Risk Interventions No flowsheet data found.

## 2019-11-25 ENCOUNTER — Ambulatory Visit (HOSPITAL_COMMUNITY)
Admit: 2019-11-25 | Discharge: 2019-11-25 | Disposition: A | Discharge status: Home / Self Care | Payer: Medicare Other | Source: Ambulatory Visit | Attending: Pulmonary Disease | Admitting: Pulmonary Disease

## 2019-11-25 DIAGNOSIS — U071 COVID-19: Secondary | ICD-10-CM | POA: Diagnosis not present

## 2019-11-25 DIAGNOSIS — J1282 Pneumonia due to coronavirus disease 2019: Secondary | ICD-10-CM | POA: Insufficient documentation

## 2019-11-25 MED ORDER — FAMOTIDINE IN NACL 20-0.9 MG/50ML-% IV SOLN: 20.0000 mg | Freq: Once | INTRAVENOUS | Status: DC | PRN

## 2019-11-25 MED ORDER — SODIUM CHLORIDE 0.9 % IV SOLN
100.0000 mg | Freq: Once | INTRAVENOUS | Status: AC
  Administered 2019-11-25: 100 mg via INTRAVENOUS
  Filled 2019-11-25: qty 20

## 2019-11-25 MED ORDER — DIPHENHYDRAMINE HCL 50 MG/ML IJ SOLN: 50.0000 mg | Freq: Once | INTRAMUSCULAR | Status: DC | PRN

## 2019-11-25 MED ORDER — EPINEPHRINE 0.3 MG/0.3ML IJ SOAJ: 0.3000 mg | Freq: Once | INTRAMUSCULAR | Status: DC | PRN

## 2019-11-25 MED ORDER — METHYLPREDNISOLONE SODIUM SUCC 125 MG IJ SOLR: 125.0000 mg | Freq: Once | INTRAMUSCULAR | Status: DC | PRN

## 2019-11-25 MED ORDER — SODIUM CHLORIDE 0.9 % IV SOLN: INTRAVENOUS | Status: DC | PRN

## 2019-11-25 MED ORDER — ALBUTEROL SULFATE HFA 108 (90 BASE) MCG/ACT IN AERS: 2.0000 | INHALATION_SPRAY | Freq: Once | RESPIRATORY_TRACT | Status: DC | PRN

## 2019-11-25 NOTE — Progress Notes (Signed)
  Diagnosis: COVID-19  Physician: Asencion Noble, MD  Procedure: [facility] Infusion Clinic Med: remdesivir infusion - Provided patient with remdesivir fact sheet for patients, parents and caregivers prior to infusion.  Complications: No immediate complications noted.  Discharge: Discharged home   Douglas Hansen 11/25/2019

## 2019-11-25 NOTE — Discharge Instructions (Signed)
10 Things You Can Do to Manage Your COVID-19 Symptoms at Home If you have possible or confirmed COVID-19: 1. Stay home from work and school. And stay away from other public places. If you must go out, avoid using any kind of public transportation, ridesharing, or taxis. 2. Monitor your symptoms carefully. If your symptoms get worse, call your healthcare provider immediately. 3. Get rest and stay hydrated. 4. If you have a medical appointment, call the healthcare provider ahead of time and tell them that you have or may have COVID-19. 5. For medical emergencies, call 911 and notify the dispatch personnel that you have or may have COVID-19. 6. Cover your cough and sneezes with a tissue or use the inside of your elbow. 7. Wash your hands often with soap and water for at least 20 seconds or clean your hands with an alcohol-based hand sanitizer that contains at least 60% alcohol. 8. As much as possible, stay in a specific room and away from other people in your home. Also, you should use a separate bathroom, if available. If you need to be around other people in or outside of the home, wear a mask. 9. Avoid sharing personal items with other people in your household, like dishes, towels, and bedding. 10. Clean all surfaces that are touched often, like counters, tabletops, and doorknobs. Use household cleaning sprays or wipes according to the label instructions. cdc.gov/coronavirus 09/07/2018 This information is not intended to replace advice given to you by your health care provider. Make sure you discuss any questions you have with your health care provider. Document Revised: 02/09/2019 Document Reviewed: 02/09/2019 Elsevier Patient Education  2020 Elsevier Inc.  

## 2019-11-26 ENCOUNTER — Ambulatory Visit (HOSPITAL_COMMUNITY)
Admit: 2019-11-26 | Discharge: 2019-11-26 | Disposition: A | Discharge status: Home / Self Care | Payer: Medicare Other | Attending: Pulmonary Disease | Admitting: Pulmonary Disease

## 2019-11-26 DIAGNOSIS — U071 COVID-19: Secondary | ICD-10-CM | POA: Insufficient documentation

## 2019-11-26 MED ORDER — SODIUM CHLORIDE 0.9 % IV SOLN
100.0000 mg | Freq: Once | INTRAVENOUS | Status: AC
  Administered 2019-11-26: 100 mg via INTRAVENOUS
  Filled 2019-11-26: qty 20

## 2019-11-26 MED ORDER — EPINEPHRINE 0.3 MG/0.3ML IJ SOAJ: 0.3000 mg | Freq: Once | INTRAMUSCULAR | Status: DC | PRN

## 2019-11-26 MED ORDER — SODIUM CHLORIDE 0.9 % IV SOLN: INTRAVENOUS | Status: DC | PRN

## 2019-11-26 MED ORDER — METHYLPREDNISOLONE SODIUM SUCC 125 MG IJ SOLR: 125.0000 mg | Freq: Once | INTRAMUSCULAR | Status: DC | PRN

## 2019-11-26 MED ORDER — FAMOTIDINE IN NACL 20-0.9 MG/50ML-% IV SOLN: 20.0000 mg | Freq: Once | INTRAVENOUS | Status: DC | PRN

## 2019-11-26 MED ORDER — ALBUTEROL SULFATE HFA 108 (90 BASE) MCG/ACT IN AERS: 2.0000 | INHALATION_SPRAY | Freq: Once | RESPIRATORY_TRACT | Status: DC | PRN

## 2019-11-26 MED ORDER — DIPHENHYDRAMINE HCL 50 MG/ML IJ SOLN: 50.0000 mg | Freq: Once | INTRAMUSCULAR | Status: DC | PRN

## 2019-11-26 NOTE — Discharge Instructions (Signed)

## 2019-11-26 NOTE — Progress Notes (Signed)
  Diagnosis: COVID-19  Physician: Dr. Joya Gaskins   Procedure: [facility] Infusion Clinic Med: casirivimab\imdevimab infusion - Provided patient with casirivimab\imdevimab fact sheet for patients, parents and caregivers prior to infusion.  Complications: No immediate complications noted.  Discharge: Discharged home   Douglas Hansen 11/26/2019 ,

## 2019-11-27 ENCOUNTER — Other Ambulatory Visit: Payer: Self-pay

## 2019-11-27 LAB — CULTURE, BLOOD (ROUTINE X 2)
Culture: NO GROWTH
Special Requests: ADEQUATE

## 2019-11-27 NOTE — Patient Outreach (Signed)
[facility]) Care Management  11/27/2019  Elison Worrel 1945-09-26 [id number]   Red emmi: Date of cal:  11/26/2019 Reason for alert:  Scheduled follow up-no  Placed call to address red emmi alert- no answer. Left a message requesting a call back.  PLAN: Unsuccessful outreach letter mailed. Will call back in 3-4 business days.   Tomasa Rand, RN, BSN, CEN [facility] [employer] [phone]

## 2019-11-28 LAB — CULTURE, BLOOD (ROUTINE X 2): Culture: NO GROWTH

## 2019-11-30 ENCOUNTER — Other Ambulatory Visit: Payer: Self-pay

## 2019-11-30 NOTE — Patient Outreach (Signed)
[facility]) Care Management  11/30/2019  Douglas Hansen [birth year] [id number]    Red Emmi:  Placed call to patient for red emmi alert for no follow up scheduled.  No answer. Left a message requesting a call back.  PLAN: Will call back in 3-4 business days for 3rd outreach attempt.  Tomasa Rand, RN, BSN, CEN [facility] [employer] [phone]

## 2019-12-06 ENCOUNTER — Other Ambulatory Visit: Payer: Self-pay

## 2019-12-06 NOTE — Patient Outreach (Signed)
[facility]) Care Management  12/06/2019  Graves Nipp March 11, 1945 [id number]   Red Emmi:  No follow up scheduled-   Placed call to patient and explained reason for call. Patient reports that he is doing well. Feels his strength is coming back.  Reports he has called MD and is waiting to scheduled follow up.  Reports he is eating and drinking well. Reports getting enough rest.   Denies any concerns today.  PLAN: close case as no needs identified.  Tomasa Rand, RN, BSN, CEN [facility] [employer] [phone]

## 2019-12-07 ENCOUNTER — Ambulatory Visit
Admission: EM | Admit: 2019-12-07 | Discharge: 2019-12-07 | Disposition: A | Discharge status: Home / Self Care | Payer: Medicare Other | Attending: Emergency Medicine | Admitting: Emergency Medicine

## 2019-12-07 DIAGNOSIS — R0981 Nasal congestion: Secondary | ICD-10-CM | POA: Diagnosis not present

## 2019-12-07 MED ORDER — FLUTICASONE PROPIONATE 50 MCG/ACT NA SUSP: 1.0000 | Freq: Every day | NASAL | 0 refills | Status: DC

## 2019-12-07 MED ORDER — FLUCONAZOLE 150 MG PO TABS: 150.0000 mg | ORAL_TABLET | Freq: Every day | ORAL | 0 refills | Status: DC

## 2019-12-07 NOTE — Discharge Instructions (Signed)
Rinse mouth with water after breathing treatments!

## 2019-12-07 NOTE — ED Triage Notes (Signed)
Pt c/o nasal congestion for the past 3-4 days. States feels like he can't get air through his nose. Speaking in complete sentences, no distress.

## 2019-12-07 NOTE — ED Provider Notes (Signed)
[facility]    CSN: [id number] Arrival date & time: 12/07/19  1458      History   Chief Complaint Chief Complaint  Patient presents with  . Nasal Congestion    HPI Douglas Hansen is a 74 y.o. male  Presenting for nasal congestion for last 3-4 days.  Was diagnosed with COVID-19 11/20/19, and hospitalized for that.  Please see hospital records which were reviewed by me.  Since discharge, patient states he had improvement, though sinus pressure began a few days ago.  Denies fever, change in hearing, dizziness, cough, difficulty breathing or chest pain.  States from the neck down he "feels good".  Not take any for this.  Has been using nebulizer treatments as he thought this would help him breathe through his nose better.  States he rinses his mouth out thereafter.  Endorsing some voice hoarseness as well.  No difficulty swallowing.  Past Medical History:  Diagnosis Date  . Agent orange exposure   . Arthritis   . Asthma   . COPD (chronic obstructive pulmonary disease) ([place])   . Diabetes mellitus   . Hyperlipemia   . Hypertension   . OSA on CPAP   . Pneumonia   . PTSD (post-traumatic stress disorder)     Patient Active Problem List   Diagnosis Date Noted  . Type 2 diabetes mellitus without complication ([place]) 50/35/4656  . Hypoxia 11/22/2019  . Pneumonia due to COVID-19 virus 11/22/2019  . Dyslipidemia 06/03/2016  . Other fatigue 06/03/2016  . Claudication ([employer]) 06/03/2016  . Acute sinusitis 12/16/2015  . Asthma with acute exacerbation 06/17/2015  . Acute bronchitis 06/17/2015  . Gastroesophageal reflux disease without esophagitis 06/17/2015  . Shortness of breath 04/17/2015  . Acute pulmonary edema ([place]) 04/17/2015  . Chronic obstructive pulmonary disease, unspecified copd, unspecified chronic bronchitis type 01/07/2015  . Moderate persistent asthma 01/07/2015  . Other allergic rhinitis 01/07/2015  . Obstructive sleep apnea treated with continuous positive  airway pressure (CPAP) 01/07/2015  . Asthma-COPD overlap syndrome (HCC)     Class: Chronic  . Pneumonia due to other gram-negative bacteria ([place]) 01/30/2011  . Cough 06/09/2010  . COPD (chronic obstructive pulmonary disease) ([place]) 06/09/2010  . OSA on CPAP   . Diabetes mellitus   . Hyperlipemia   . Essential hypertension     Past Surgical History:  Procedure Laterality Date  . CARDIAC CATHETERIZATION    . right thumb surgery    . TONSILLECTOMY         Home Medications    Prior to Admission medications   Medication Sig Start Date End Date Taking? Authorizing Provider  albuterol (VENTOLIN HFA) 108 (90 Base) MCG/ACT inhaler INHALE 2 PUFFS BY MOUTH EVERY 4 HOURS AS NEEDED FOR WHEEZING OR SHORTNESS OF BREATH Patient taking differently: Inhale 2 puffs into the lungs every 4 (four) hours as needed for wheezing or shortness of breath.  08/22/19   Garnet Sierras, DO  amLODipine (NORVASC) 10 MG tablet Take 10 mg by mouth daily. 04/06/19   [provider]  ascorbic acid (VITAMIN C) 500 MG tablet Take 1,000 mg by mouth daily.  04/06/19   [provider]  aspirin EC 325 MG tablet Take 325 mg by mouth daily.     [provider]  benzonatate (TESSALON PERLES) 100 MG capsule Take 1 capsule (100 mg total) by mouth every 6 (six) hours as needed for cough. 11/24/19 11/23/20  Ghimire, Henreitta Leber, MD  budesonide-formoterol (SYMBICORT) 80-4.5 MCG/ACT inhaler Inhale 2  puffs into the lungs 2 (two) times daily.  10/31/19   [provider]  carvedilol (COREG) 12.5 MG tablet Take 12.5 mg by mouth 2 (two) times daily with a meal.  11/09/19   [provider]  Cellulose Carmellose Sodium POWD Apply 1 application topically daily as needed (irritation). Apply to feet    [provider]  Cholecalciferol (VITAMIN D-3 PO) Take 1 capsule by mouth daily.    [provider]  ciclesonide (ALVESCO) 80 MCG/ACT inhaler Inhale 2 puffs into the lungs 2 (two) times daily.      [provider]  clobetasol ointment (TEMOVATE) 4.13 % Apply 1 application topically 2 (two) times daily as needed (to affected area).  11/13/19   [provider]  Diclofenac Sodium 1.5 % SOLN Apply 4 drops topically 4 (four) times daily as needed (as directed- to affected area).  10/17/14   [provider]  doxazosin (CARDURA) 1 MG tablet Take 1 mg by mouth daily. 04/03/19   [provider]  EPINEPHrine 0.3 mg/0.3 mL IJ SOAJ injection Inject 0.3 mLs (0.3 mg total) into the muscle as needed for anaphylaxis. 11/16/19   Kennith Gain, MD  EYLEA 2 MG/0.05ML SOLN Place into the left eye every 30 (thirty) days. INJECTION 04/17/19   [provider]  fluconazole (DIFLUCAN) 150 MG tablet Take 1 tablet (150 mg total) by mouth daily. May repeat in 72 hours if needed 12/07/19   Hall-Potvin, [place], PA-C  Fluocinolone Acetonide Body 0.01 % OIL Apply 1 application topically See admin instructions. Apply a small amount to affected area daily 11/09/19   [provider]  fluticasone (FLONASE) 50 MCG/ACT nasal spray Place 1 spray into both nostrils daily. 12/07/19   Hall-Potvin, [place], PA-C  furosemide (LASIX) 20 MG tablet Take 20 mg by mouth in the morning.    [provider]  ketoconazole (NIZORAL) 2 % shampoo Apply 1 application topically 3 (three) times a week.    [provider]  mepolizumab (NUCALA) 100 MG/ML SOSY Inject 100 mg into the skin every 28 (twenty-eight) days.    [provider]  metFORMIN (GLUCOPHAGE) 500 MG tablet Take 250 mg by mouth 2 (two) times daily as needed (for elevated BGL).    [provider]  montelukast (SINGULAIR) 10 MG tablet Take 10 mg by mouth at bedtime.  04/06/19   [provider]  Multiple Vitamins-Minerals (MULTIVITAMIN & MINERAL PO) Take 1 tablet by mouth daily. Reported on 06/17/2015    [provider]  omeprazole (PRILOSEC) 20 MG capsule Take 20 mg by mouth daily  before breakfast. 11/13/19   [provider]  OneTouch Delica Lancets 24M MISC daily. 09/01/19   [provider]  [facility] ULTRA test strip as needed.  09/01/19   [provider]  Respiratory Therapy Supplies (FLUTTER) DEVI Use as directed 04/20/16   Bobbitt, [place] Muta, MD  rosuvastatin (CRESTOR) 20 MG tablet Take 10 mg by mouth at bedtime.  11/09/19   [provider]  sertraline (ZOLOFT) 100 MG tablet Take 100 mg by mouth daily. 04/05/19   [provider]  simethicone (MYLICON) 80 MG chewable tablet Chew 80 mg by mouth 2 (two) times daily as needed for flatulence.    [provider]  Tiotropium Bromide Monohydrate (SPIRIVA RESPIMAT) 2.5 MCG/ACT AERS Inhale 2 puffs one daily Patient taking differently: Inhale 2 puffs into the lungs daily.  08/21/19   Garnet Sierras, DO    Family History Family History  Problem  Relation Age of Onset  . Emphysema Father   . Asthma Father   . Heart disease Father   . Heart attack Father   . Stroke Mother   . Heart attack Brother   . Diabetes Brother   . Heart attack Brother   . Diabetes Brother   . Colon cancer Neg Hx     Social History Social History   Tobacco Use  . Smoking status: Former Smoker    Packs/day: 1.00    Years: 20.00    Pack years: 20.00    Types: Cigarettes    Quit date: 03/09/1985    Years since quitting: 34.7  . Smokeless tobacco: Never Used  Vaping Use  . Vaping Use: Never used  Substance Use Topics  . Alcohol use: No  . Drug use: No     Allergies   Codeine, Lipitor [atorvastatin], and Pravachol [pravastatin sodium]   Review of Systems As per HPI   Physical Exam Triage Vital Signs ED Triage Vitals  Enc Vitals Group     BP      Pulse      Resp      Temp      Temp src      SpO2      Weight      Height      Head Circumference      Peak Flow      Pain Score      Pain Loc      Pain Edu?      Excl. in [place]?    No data found.  Updated Vital Signs BP 136/77  (BP Location: Left Arm)   Pulse 72   Temp 98.3 F (36.8 C) (Oral)   Resp 18   SpO2 94%   Visual Acuity Right Eye Distance:   Left Eye Distance:   Bilateral Distance:    Right Eye Near:   Left Eye Near:    Bilateral Near:     Physical Exam Constitutional:      General: He is not in acute distress. HENT:     Head: Normocephalic and atraumatic.     Right Ear: Tympanic membrane and ear canal normal.     Left Ear: Tympanic membrane and ear canal normal.     Nose:     Comments: Bilateral turbinate edema with mucosal pallor (L>R).  Negative sinus tenderness bilaterally.    Mouth/Throat:     Mouth: Mucous membranes are moist.     Pharynx: No oropharyngeal exudate or posterior oropharyngeal erythema.     Comments: Posterior pharynx with scant thrush. Eyes:     General: No scleral icterus.    Pupils: Pupils are equal, round, and reactive to light.  Cardiovascular:     Rate and Rhythm: Normal rate.  Pulmonary:     Effort: Pulmonary effort is normal. No respiratory distress.     Breath sounds: No wheezing.  Skin:    Coloration: Skin is not jaundiced or pale.  Neurological:     Mental Status: He is alert and oriented to person, place, and time.      UC Treatments / Results  Labs (all labs ordered are listed, but only abnormal results are displayed) Labs Reviewed - No data to display  EKG   Radiology No results found.  Procedures Procedures (including critical care time)  Medications Ordered in UC Medications - No data to display  Initial Impression / Assessment and Plan / UC Course  I have reviewed the triage  vital signs and the nursing notes.  Pertinent labs & imaging results that were available during my care of the patient were reviewed by me and considered in my medical decision making (see chart for details).     Afebrile, nontoxic, no acute distress.  Does not require O2 at home.  Has been doing well s/p hospital discharge second to Covid.  Endorsing  nasal congestion with difficulty breathing through his nose.  Discussed that albuterol nebulizer is not to be used for difficulty breathing through her nose as he is able to breathe through his mouth fine.  We will treat for mild thrush as outlined below.  Return precautions discussed, pt verbalized understanding and is agreeable to plan. Final Clinical Impressions(s) / UC Diagnoses   Final diagnoses:  Nasal congestion     Discharge Instructions     Rinse mouth with water after breathing treatments!    ED Prescriptions    Medication Sig Dispense Auth. Provider   fluconazole (DIFLUCAN) 150 MG tablet Take 1 tablet (150 mg total) by mouth daily. May repeat in 72 hours if needed 2 tablet Hall-Potvin, [place], PA-C   fluticasone (FLONASE) 50 MCG/ACT nasal spray Place 1 spray into both nostrils daily. 16 g Hall-Potvin, [place], PA-C     PDMP not reviewed this encounter.   Hall-Potvin, [place], [place] 12/07/19 [phone]

## 2019-12-14 ENCOUNTER — Ambulatory Visit: Payer: TRICARE For Life (TFL)

## 2019-12-17 DIAGNOSIS — J4550 Severe persistent asthma, uncomplicated: Secondary | ICD-10-CM

## 2019-12-18 ENCOUNTER — Other Ambulatory Visit: Payer: Self-pay

## 2019-12-18 ENCOUNTER — Encounter: Payer: Self-pay | Admitting: Allergy

## 2019-12-18 ENCOUNTER — Ambulatory Visit (INDEPENDENT_AMBULATORY_CARE_PROVIDER_SITE_OTHER): Payer: Medicare Other | Admitting: Allergy

## 2019-12-18 ENCOUNTER — Ambulatory Visit (INDEPENDENT_AMBULATORY_CARE_PROVIDER_SITE_OTHER): Payer: Medicare Other | Admitting: *Deleted

## 2019-12-18 VITALS — BP 118/64 | HR 76 | Temp 98.0°F | Resp 20

## 2019-12-18 DIAGNOSIS — J4489 Other specified chronic obstructive pulmonary disease: Secondary | ICD-10-CM

## 2019-12-18 DIAGNOSIS — J4550 Severe persistent asthma, uncomplicated: Secondary | ICD-10-CM

## 2019-12-18 DIAGNOSIS — K219 Gastro-esophageal reflux disease without esophagitis: Secondary | ICD-10-CM | POA: Diagnosis not present

## 2019-12-18 DIAGNOSIS — J302 Other seasonal allergic rhinitis: Secondary | ICD-10-CM

## 2019-12-18 DIAGNOSIS — J449 Chronic obstructive pulmonary disease, unspecified: Secondary | ICD-10-CM

## 2019-12-18 DIAGNOSIS — J3089 Other allergic rhinitis: Secondary | ICD-10-CM

## 2019-12-18 MED ORDER — CARBINOXAMINE MALEATE 4 MG PO TABS: 1.0000 | ORAL_TABLET | Freq: Two times a day (BID) | ORAL | 1 refills | Status: DC | PRN

## 2019-12-18 NOTE — Assessment & Plan Note (Addendum)
Past history - Usually has issues with his breathing twice a year - in the spring and fall. Follows with cardiology and apparently also with pulmonology. Ex-smoker. Interim history - hospitalized with COVID-19 pneumonia in September treated with remdesivir. Doing better now but still has some coughing and shortness of breath.  Will get spirometry at next visit instead of today due to COVID-19 pandemic and trying to minimize any type of aerosolizing procedures at this time in the office.  Daily controller medication(s):   Symbicort 1101mcg 2 puffs twice a day with spacer and rinse mouth afterwards.  Alvesco 36mcg 2 puffs twice a day with spacer and rinse mouth afterwards.  Spiriva 2.70mcg 2 puffs ONCE a day.   Continue with Singulair 10mg  daily at night.  Continue Nucala injections every 4 weeks - second dose given today. May use albuterol rescue inhaler 2 puffs every 4 to 6 hours as needed for shortness of breath, chest tightness, coughing, and wheezing. May use albuterol rescue inhaler 2 puffs 5 to 15 minutes prior to strenuous physical activities. Monitor frequency of use.  Monitor oxygen level. Follow up with pulmonology. Recommend annual flu vaccine and COVID-19 booster vaccine.

## 2019-12-18 NOTE — Patient Instructions (Addendum)
COPD/asthma Daily controller medication(s):   Symbicort 168mcg 2 puffs twice a day with spacer and rinse mouth afterwards.  Alvesco 7mcg 2 puffs twice a day with spacer and rinse mouth afterwards. Spiriva 2.42mcg 2 puffs ONCE a day.   Continue with Singulair 10mg  daily at night.  Continue Nucala injections every 4 weeks.  May use albuterol rescue inhaler 2 puffs every 4 to 6 hours as needed for shortness of breath, chest tightness, coughing, and wheezing. May use albuterol rescue inhaler 2 puffs 5 to 15 minutes prior to strenuous physical activities. Monitor frequency of use.  Asthma control goals:  Full participation in all desired activities (may need albuterol before activity) Albuterol use two times or less a week on average (not counting use with activity) Cough interfering with sleep two times or less a month Oral steroids no more than once a year No hospitalizations  Other allergic rhinitis  Allergy testing was positive to: dust mites, grass pollen, tree pollen and ragweed pollen.  Continue appropriate allergen avoidance measures  Continue montelukast 10 mg daily at bedtime.  Start carbinoxamine 4mg  every 12 hours as needed for allergies.   If you notice issues with worsening urination let us know.   May use azelastine nasal spray 1-2 sprays per nostril twice a day as needed for drainage  May use fluticasone nasal spray, 1 spray per nostril twice a day.   Nasal saline lavage (NeilMed) has been recommended as needed and prior to medicated nasal sprays along with instructions for proper administration.   Gastroesophageal reflux disease without esophagitis  Continue omeprazole as prescribed.  Follow up in 4 weeks or sooner if needed.  Follow up with your PCP and pulmonologist.  Keep track of your oxygen levels.  Recommend annual flu shot. Recommend Pfizer booster vaccine.  Control of House Dust Mite Allergen . Dust mite allergens are a common trigger of allergy and  asthma symptoms. While they can be found throughout the house, these microscopic creatures thrive in warm, humid environments such as bedding, upholstered furniture and carpeting. . Because so much time is spent in the bedroom, it is essential to reduce mite levels there.  . Encase pillows, mattresses, and box springs in special allergen-proof fabric covers or airtight, zippered plastic covers.  . Bedding should be washed weekly in hot water (130 F) and dried in a hot dryer. Allergen-proof covers are available for comforters and pillows that can't be regularly washed.  Wendee Copp the allergy-proof covers every few months. Minimize clutter in the bedroom. Keep pets out of the bedroom.  Marland Kitchen Keep humidity less than 50% by using a dehumidifier or air conditioning. You can buy a humidity measuring device called a hygrometer to monitor this.  . If possible, replace carpets with hardwood, linoleum, or washable area rugs. If that's not possible, vacuum frequently with a vacuum that has a HEPA filter. . Remove all upholstered furniture and non-washable window drapes from the bedroom. . Remove all non-washable stuffed toys from the bedroom.  Wash stuffed toys weekly. Reducing Pollen Exposure . Pollen seasons: trees (spring), grass (summer) and ragweed/weeds (fall). Marland Kitchen Keep windows closed in your home and car to lower pollen exposure.  Susa Simmonds air conditioning in the bedroom and throughout the house if possible.  . Avoid going out in dry windy days - especially early morning. . Pollen counts are highest between 5 - 10 AM and on dry, hot and windy days.  . Save outside activities for late afternoon or after a heavy  rain, when pollen levels are lower.  . Avoid mowing of grass if you have grass pollen allergy. Marland Kitchen Be aware that pollen can also be transported indoors on people and pets.  . Dry your clothes in an automatic dryer rather than hanging them outside where they might collect pollen.  . Rinse hair and eyes  before bedtime.

## 2019-12-18 NOTE — Progress Notes (Signed)
Follow Up Note  RE: Douglas Hansen MRN: [id number] DOB: 05/26/1945 Date of Office Visit: 12/18/2019  Referring provider: [name], MD Primary care provider: [name], MD  Chief Complaint: Asthma (SOB and coughing)  History of Present Illness: I had the pleasure of seeing Douglas Hansen for a follow up visit at the [facility] on 12/18/2019. He is a 74 y.o. male, who is being followed for asthma/COPD overlap syndrome, allergic rhinitis and GERD. His previous allergy office visit was on 10/25/2019 with Dr. Maudie Mercury. Today is a regular follow up visit.  Asthma-COPD overlap syndrome [employer]) Patient got Covid-19 infection and pneumonia in September. He was hospitalized for about 3 days and received remdesivir.  Patient is doing better but does not have as much energy.   He had both his Pfizer injections in February 6th and 27th.   Patient received his Nucala injection on 11/16/19 and received his second Nucala injection today.  Currently on Symbicort 194mcg 2 puffs twice a day, Spiriva 2 puffs once a day, Alvesco 26mcg 2 puffs twice a day.  Not had to use albuterol since discharge from the hospital.  Taking Singulair 10mg  daily.  Patient's oxygen has been around 93-97% but sometimes having issues with some nasal congestion possibly.  Denies any wheezing.   Taking Mucinex at night for phlegm and has some coughing in the mornings.   Other allergic rhinitis Flonase 1 spray per nostril daily with no nosebleeds. Using azelastine nasal sprays 2 sprays a night with good benefit. Not taking zyrtec anymore.   Gastroesophageal reflux disease without esophagitis Takes omeprazole as needed with good benefit.   11/22/2019 hospitalization: "Acute Hypoxic Resp Failure due to Covid 19 Viral pneumonia: Had very mild hypoxemia on presentation-rapidly improved with steroids and Remdesivir-has been on room air since yesterday.  He was ambulated in the room yesterday without any major  issues-he again ambulated in the hallway today with nursing staff-O2 saturations were persistently above 92.  He feels better-and is anxious to leave the hospital-he will complete the remainder of the 2 doses of Remdesivir at the infusion center tomorrow and the day after.  He will be discharged on tapering steroids.  He was instructed to seek immediate medical attention if in the unlikely event if his shortness of breath worsens.  Spoke with patient's daughter on the day of discharge as well."  Assessment and Plan: Douglas Hansen is a 74 y.o. male with: Asthma-COPD overlap syndrome (HCC) Past history - Usually has issues with his breathing twice a year - in the spring and fall. Follows with cardiology and apparently also with pulmonology. Ex-smoker. Interim history - hospitalized with COVID-19 pneumonia in September treated with remdesivir. Doing better now but still has some coughing and shortness of breath.  Will get spirometry at next visit instead of today due to COVID-19 pandemic and trying to minimize any type of aerosolizing procedures at this time in the office.  Daily controller medication(s):   Symbicort 141mcg 2 puffs twice a day with spacer and rinse mouth afterwards.  Alvesco 38mcg 2 puffs twice a day with spacer and rinse mouth afterwards.  Spiriva 2.47mcg 2 puffs ONCE a day.   Continue with Singulair 10mg  daily at night.  Continue Nucala injections every 4 weeks - second dose given today. May use albuterol rescue inhaler 2 puffs every 4 to 6 hours as needed for shortness of breath, chest tightness, coughing, and wheezing. May use albuterol rescue inhaler 2 puffs 5 to 15 minutes prior  to strenuous physical activities. Monitor frequency of use.  Monitor oxygen level. Follow up with pulmonology. Recommend annual flu vaccine and COVID-19 booster vaccine.  Seasonal and perennial allergic rhinitis Past history - Some mild rhino conjunctivitis symptoms. 2021 bloodwork was positive to dust mites,  grass pollen, tree pollen and ragweed pollen. Interim history - some increased nasal congestion.   Continue appropriate allergen avoidance measures  Continue montelukast 10 mg daily at bedtime.  Start carbinoxamine 4mg  every 12 hours as needed for allergies.   If you notice issues with worsening urination let us know.   May use azelastine nasal spray 1-2 sprays per nostril twice a day as needed for drainage  May use fluticasone nasal spray, 1 spray per nostril twice a day.   Nasal saline lavage (NeilMed) has been recommended as needed and prior to medicated nasal sprays along with instructions for proper administration.   Gastroesophageal reflux disease without esophagitis Stable.  Continue appropriate reflux list on modifications and omeprazole as prescribed.  Return in about 4 weeks (around 01/15/2020).  Follow up with PCP for post hospital discharge follow up.  Meds ordered this encounter  Medications  . Carbinoxamine Maleate 4 MG TABS    Sig: Take 1 tablet (4 mg total) by mouth 2 (two) times daily as needed (allergies).    Dispense:  60 tablet    Refill:  1   Diagnostics: None.  Medication List:  Current Outpatient Medications  Medication Sig Dispense Refill  . albuterol (VENTOLIN HFA) 108 (90 Base) MCG/ACT inhaler INHALE 2 PUFFS BY MOUTH EVERY 4 HOURS AS NEEDED FOR WHEEZING OR SHORTNESS OF BREATH (Patient taking differently: Inhale 2 puffs into the lungs every 4 (four) hours as needed for wheezing or shortness of breath. ) 42.5 g 0  . amLODipine (NORVASC) 10 MG tablet Take 10 mg by mouth daily.    Marland Kitchen ascorbic acid (VITAMIN C) 500 MG tablet Take 1,000 mg by mouth daily.     Marland Kitchen aspirin EC 325 MG tablet Take 325 mg by mouth daily.     . benzonatate (TESSALON PERLES) 100 MG capsule Take 1 capsule (100 mg total) by mouth every 6 (six) hours as needed for cough. 30 capsule 0  . budesonide-formoterol (SYMBICORT) 80-4.5 MCG/ACT inhaler Inhale 2 puffs into the lungs 2 (two) times  daily.     . carvedilol (COREG) 12.5 MG tablet Take 12.5 mg by mouth 2 (two) times daily with a meal.     . Cellulose Carmellose Sodium POWD Apply 1 application topically daily as needed (irritation). Apply to feet    . Cholecalciferol (VITAMIN D-3 PO) Take 1 capsule by mouth daily.    . clobetasol ointment (TEMOVATE) 1.69 % Apply 1 application topically 2 (two) times daily as needed (to affected area).     . Diclofenac Sodium 1.5 % SOLN Apply 4 drops topically 4 (four) times daily as needed (as directed- to affected area).   0  . doxazosin (CARDURA) 1 MG tablet Take 1 mg by mouth daily.    Marland Kitchen EPINEPHrine 0.3 mg/0.3 mL IJ SOAJ injection Inject 0.3 mLs (0.3 mg total) into the muscle as needed for anaphylaxis. 2 each 1  . EYLEA 2 MG/0.05ML SOLN Place into the left eye every 30 (thirty) days. INJECTION    . Fluocinolone Acetonide Body 0.01 % OIL Apply 1 application topically See admin instructions. Apply a small amount to affected area daily    . fluticasone (FLONASE) 50 MCG/ACT nasal spray Place 1 spray into both nostrils  daily. 16 g 0  . furosemide (LASIX) 40 MG tablet Take 40 mg by mouth daily.    Marland Kitchen ketoconazole (NIZORAL) 2 % shampoo Apply 1 application topically 3 (three) times a week.    . mepolizumab (NUCALA) 100 MG/ML SOSY Inject 100 mg into the skin every 28 (twenty-eight) days.    . metFORMIN (GLUCOPHAGE) 500 MG tablet Take 250 mg by mouth 2 (two) times daily as needed (for elevated BGL).    Marland Kitchen montelukast (SINGULAIR) 10 MG tablet Take 10 mg by mouth at bedtime.     . Multiple Vitamins-Minerals (MULTIVITAMIN & MINERAL PO) Take 1 tablet by mouth daily. Reported on 06/17/2015    . omeprazole (PRILOSEC) 20 MG capsule Take 20 mg by mouth daily before breakfast.    . OneTouch Delica Lancets 32I MISC daily.    Glory Rosebush ULTRA test strip as needed.     Marland Kitchen Respiratory Therapy Supplies (FLUTTER) DEVI Use as directed 1 each 0  . rosuvastatin (CRESTOR) 20 MG tablet Take 10 mg by mouth at bedtime.     .  sertraline (ZOLOFT) 100 MG tablet Take 100 mg by mouth daily.    . simethicone (MYLICON) 80 MG chewable tablet Chew 80 mg by mouth 2 (two) times daily as needed for flatulence.    . Carbinoxamine Maleate 4 MG TABS Take 1 tablet (4 mg total) by mouth 2 (two) times daily as needed (allergies). 60 tablet 1  . ciclesonide (ALVESCO) 80 MCG/ACT inhaler Inhale 2 puffs into the lungs 2 (two) times daily.  (Patient not taking: Reported on 12/18/2019)    . Tiotropium Bromide Monohydrate (SPIRIVA RESPIMAT) 2.5 MCG/ACT AERS Inhale 2 puffs one daily (Patient not taking: Reported on 12/18/2019) 4 g 5   Current Facility-Administered Medications  Medication Dose Route Frequency Provider Last Rate Last Admin  . Mepolizumab SOLR 100 mg  100 mg Subcutaneous Q28 days Kennith Gain, MD   100 mg at 12/18/19 1142   Allergies: Allergies  Allergen Reactions  . Codeine Nausea Only and Other (See Comments)    GI upset  . Lipitor [Atorvastatin] Other (See Comments)    "Made my stomach burn"  . Pravachol [Pravastatin Sodium] Other (See Comments)    Caused joint pain   I reviewed his past medical history, social history, family history, and environmental history and no significant changes have been reported from his previous visit.  Review of Systems  Constitutional: Negative for appetite change, chills, fever and unexpected weight change.  HENT: Positive for congestion and rhinorrhea.   Eyes: Negative for itching.  Respiratory: Positive for cough and shortness of breath. Negative for chest tightness and wheezing.   Gastrointestinal: Negative for abdominal pain.  Skin: Negative for rash.  Allergic/Immunologic: Positive for environmental allergies.  Neurological: Negative for headaches.   Objective: BP 118/64   Pulse 76   Temp 98 F (36.7 C) (Temporal)   Resp 20   SpO2 93%  There is no height or weight on file to calculate BMI. Physical Exam Vitals and nursing note reviewed.  Constitutional:       Appearance: Normal appearance. He is well-developed.  HENT:     Head: Normocephalic and atraumatic.     Right Ear: Tympanic membrane and external ear normal.     Left Ear: Tympanic membrane and external ear normal.     Nose: Congestion present.     Mouth/Throat:     Mouth: Mucous membranes are moist.     Pharynx: Oropharynx is clear.  Eyes:  Conjunctiva/sclera: Conjunctivae normal.  Cardiovascular:     Rate and Rhythm: Normal rate and regular rhythm.     Heart sounds: Normal heart sounds. No murmur heard.   Pulmonary:     Effort: Pulmonary effort is normal.     Breath sounds: No wheezing, rhonchi or rales.     Comments: Decreased breath sounds throughout. Musculoskeletal:     Cervical back: Neck supple.  Skin:    General: Skin is warm.     Findings: No rash.  Neurological:     Mental Status: He is alert and oriented to person, place, and time.  Psychiatric:        Behavior: Behavior normal.    Previous notes and tests were reviewed. The plan was reviewed with the patient/family, and all questions/concerned were addressed.  It was my pleasure to see Agron today and participate in his care. Please feel free to contact me with any questions or concerns.  Sincerely,  Rexene Alberts, DO Allergy & Immunology  [facility] office: [place] office: [phone]

## 2019-12-18 NOTE — Assessment & Plan Note (Signed)
Past history - Some mild rhino conjunctivitis symptoms. 2021 bloodwork was positive to dust mites, grass pollen, tree pollen and ragweed pollen. Interim history - some increased nasal congestion.   Continue appropriate allergen avoidance measures  Continue montelukast 10 mg daily at bedtime.  Start carbinoxamine 4mg  every 12 hours as needed for allergies.   If you notice issues with worsening urination let us know.   May use azelastine nasal spray 1-2 sprays per nostril twice a day as needed for drainage  May use fluticasone nasal spray, 1 spray per nostril twice a day.   Nasal saline lavage (NeilMed) has been recommended as needed and prior to medicated nasal sprays along with instructions for proper administration.

## 2019-12-18 NOTE — Assessment & Plan Note (Signed)
Stable.  Continue appropriate reflux list on modifications and omeprazole as prescribed.

## 2019-12-27 ENCOUNTER — Ambulatory Visit: Payer: Medicare Other | Admitting: Allergy

## 2020-01-02 ENCOUNTER — Emergency Department (HOSPITAL_COMMUNITY): Payer: Medicare Other

## 2020-01-02 ENCOUNTER — Encounter: Payer: Self-pay | Admitting: *Deleted

## 2020-01-02 ENCOUNTER — Ambulatory Visit
Admission: EM | Admit: 2020-01-02 | Discharge: 2020-01-02 | Disposition: A | Discharge status: Home / Self Care | Payer: Medicare Other

## 2020-01-02 ENCOUNTER — Observation Stay (HOSPITAL_COMMUNITY)
Admission: EM | Admit: 2020-01-02 | Discharge: 2020-01-03 | Disposition: A | Discharge status: Home / Self Care | Payer: Medicare Other | Attending: Student | Admitting: Student

## 2020-01-02 ENCOUNTER — Other Ambulatory Visit: Payer: Self-pay

## 2020-01-02 DIAGNOSIS — I5033 Acute on chronic diastolic (congestive) heart failure: Secondary | ICD-10-CM | POA: Diagnosis not present

## 2020-01-02 DIAGNOSIS — R079 Chest pain, unspecified: Secondary | ICD-10-CM | POA: Diagnosis not present

## 2020-01-02 DIAGNOSIS — Z87891 Personal history of nicotine dependence: Secondary | ICD-10-CM | POA: Diagnosis not present

## 2020-01-02 DIAGNOSIS — E119 Type 2 diabetes mellitus without complications: Secondary | ICD-10-CM | POA: Insufficient documentation

## 2020-01-02 DIAGNOSIS — Z79899 Other long term (current) drug therapy: Secondary | ICD-10-CM | POA: Diagnosis not present

## 2020-01-02 DIAGNOSIS — Z7984 Long term (current) use of oral hypoglycemic drugs: Secondary | ICD-10-CM | POA: Diagnosis not present

## 2020-01-02 DIAGNOSIS — J449 Chronic obstructive pulmonary disease, unspecified: Secondary | ICD-10-CM | POA: Diagnosis not present

## 2020-01-02 DIAGNOSIS — Z8616 Personal history of COVID-19: Secondary | ICD-10-CM | POA: Insufficient documentation

## 2020-01-02 DIAGNOSIS — R0602 Shortness of breath: Secondary | ICD-10-CM | POA: Diagnosis not present

## 2020-01-02 DIAGNOSIS — I110 Hypertensive heart disease with heart failure: Secondary | ICD-10-CM | POA: Insufficient documentation

## 2020-01-02 DIAGNOSIS — Z7982 Long term (current) use of aspirin: Secondary | ICD-10-CM | POA: Insufficient documentation

## 2020-01-02 DIAGNOSIS — R059 Cough, unspecified: Secondary | ICD-10-CM | POA: Diagnosis not present

## 2020-01-02 DIAGNOSIS — J45909 Unspecified asthma, uncomplicated: Secondary | ICD-10-CM | POA: Diagnosis not present

## 2020-01-02 LAB — CBC
HCT: 39.7 % (ref 39.0–52.0)
Hemoglobin: 12.6 g/dL — ABNORMAL LOW (ref 13.0–17.0)
MCH: 29.7 pg (ref 26.0–34.0)
MCHC: 31.7 g/dL (ref 30.0–36.0)
MCV: 93.6 fL (ref 80.0–100.0)
Platelets: 254 10*3/uL (ref 150–400)
RBC: 4.24 MIL/uL (ref 4.22–5.81)
RDW: 14.9 % (ref 11.5–15.5)
WBC: 5.4 10*3/uL (ref 4.0–10.5)
nRBC: 0 % (ref 0.0–0.2)

## 2020-01-02 LAB — BASIC METABOLIC PANEL
Anion gap: 8 (ref 5–15)
BUN: 10 mg/dL (ref 8–23)
CO2: 31 mmol/L (ref 22–32)
Calcium: 10.2 mg/dL (ref 8.9–10.3)
Chloride: 102 mmol/L (ref 98–111)
Creatinine, Ser: 0.89 mg/dL (ref 0.61–1.24)
GFR, Estimated: >60 mL/min (ref >60)
Glucose, Bld: 101 mg/dL — ABNORMAL HIGH (ref 70–99)
Potassium: 3.5 mmol/L (ref 3.5–5.1)
Sodium: 141 mmol/L (ref 135–145)

## 2020-01-02 LAB — BRAIN NATRIURETIC PEPTIDE: B Natriuretic Peptide: 23.8 pg/mL (ref 0.0–100.0)

## 2020-01-02 LAB — GLUCOSE, CAPILLARY: Glucose-Capillary: 111 mg/dL — ABNORMAL HIGH (ref 70–99)

## 2020-01-02 LAB — TROPONIN I (HIGH SENSITIVITY)
Troponin I (High Sensitivity): 5 ng/L (ref <18)
Troponin I (High Sensitivity): 5 ng/L (ref <18)

## 2020-01-02 MED ORDER — MONTELUKAST SODIUM 10 MG PO TABS
10.0000 mg | ORAL_TABLET | Freq: Every day | ORAL | Status: DC
  Administered 2020-01-02: 10 mg via ORAL
  Filled 2020-01-02 (×2): qty 1

## 2020-01-02 MED ORDER — CICLESONIDE 80 MCG/ACT IN AERS: 2.0000 | INHALATION_SPRAY | Freq: Two times a day (BID) | RESPIRATORY_TRACT | Status: DC

## 2020-01-02 MED ORDER — CARVEDILOL 6.25 MG PO TABS
6.2500 mg | ORAL_TABLET | Freq: Two times a day (BID) | ORAL | Status: DC
  Administered 2020-01-03 (×2): 6.25 mg via ORAL
  Filled 2020-01-02 (×2): qty 1

## 2020-01-02 MED ORDER — PANTOPRAZOLE SODIUM 40 MG PO TBEC
40.0000 mg | DELAYED_RELEASE_TABLET | Freq: Every day | ORAL | Status: DC
  Administered 2020-01-03: 40 mg via ORAL
  Filled 2020-01-02: qty 1

## 2020-01-02 MED ORDER — ASPIRIN EC 325 MG PO TBEC
325.0000 mg | DELAYED_RELEASE_TABLET | Freq: Every day | ORAL | Status: DC
  Administered 2020-01-03: 325 mg via ORAL
  Filled 2020-01-02: qty 1

## 2020-01-02 MED ORDER — MOMETASONE FURO-FORMOTEROL FUM 100-5 MCG/ACT IN AERO
2.0000 | INHALATION_SPRAY | Freq: Two times a day (BID) | RESPIRATORY_TRACT | Status: DC
  Administered 2020-01-02 – 2020-01-03 (×2): 2 via RESPIRATORY_TRACT
  Filled 2020-01-02: qty 8.8

## 2020-01-02 MED ORDER — FUROSEMIDE 10 MG/ML IJ SOLN
40.0000 mg | Freq: Once | INTRAMUSCULAR | Status: AC
  Administered 2020-01-02: 40 mg via INTRAVENOUS
  Filled 2020-01-02: qty 4

## 2020-01-02 MED ORDER — CLOBETASOL PROPIONATE 0.05 % EX OINT: 1.0000 "application " | TOPICAL_OINTMENT | Freq: Two times a day (BID) | CUTANEOUS | Status: DC | PRN

## 2020-01-02 MED ORDER — DICLOFENAC SODIUM 1.5 % TD SOLN: 4.0000 [drp] | Freq: Four times a day (QID) | TRANSDERMAL | Status: DC | PRN

## 2020-01-02 MED ORDER — ROSUVASTATIN CALCIUM 5 MG PO TABS
10.0000 mg | ORAL_TABLET | Freq: Every day | ORAL | Status: DC
  Administered 2020-01-02: 10 mg via ORAL
  Filled 2020-01-02: qty 2

## 2020-01-02 MED ORDER — FLUTICASONE PROPIONATE 50 MCG/ACT NA SUSP
1.0000 | Freq: Every day | NASAL | Status: DC
  Filled 2020-01-02: qty 16

## 2020-01-02 MED ORDER — INSULIN ASPART 100 UNIT/ML ~~LOC~~ SOLN
0.0000 [IU] | Freq: Three times a day (TID) | SUBCUTANEOUS | Status: DC
  Administered 2020-01-03: 1 [IU] via SUBCUTANEOUS

## 2020-01-02 MED ORDER — NITROGLYCERIN 0.4 MG SL SUBL: 0.4000 mg | SUBLINGUAL_TABLET | SUBLINGUAL | Status: DC | PRN

## 2020-01-02 MED ORDER — ENOXAPARIN SODIUM 40 MG/0.4ML ~~LOC~~ SOLN
40.0000 mg | SUBCUTANEOUS | Status: DC
  Administered 2020-01-02: 40 mg via SUBCUTANEOUS
  Filled 2020-01-02: qty 0.4

## 2020-01-02 MED ORDER — FLUOCINOLONE ACETONIDE BODY 0.01 % EX OIL: 1.0000 "application " | TOPICAL_OIL | CUTANEOUS | Status: DC

## 2020-01-02 MED ORDER — FUROSEMIDE 40 MG PO TABS
40.0000 mg | ORAL_TABLET | Freq: Every day | ORAL | Status: DC
  Administered 2020-01-03: 40 mg via ORAL
  Filled 2020-01-02: qty 1

## 2020-01-02 MED ORDER — BENZONATATE 100 MG PO CAPS
200.0000 mg | ORAL_CAPSULE | Freq: Three times a day (TID) | ORAL | Status: DC
  Administered 2020-01-02 – 2020-01-03 (×3): 200 mg via ORAL
  Filled 2020-01-02 (×3): qty 2

## 2020-01-02 MED ORDER — VITAMIN D 25 MCG (1000 UNIT) PO TABS
ORAL_TABLET | Freq: Every day | ORAL | Status: DC
  Administered 2020-01-03: 1000 [IU] via ORAL
  Filled 2020-01-02: qty 1

## 2020-01-02 MED ORDER — ACETAMINOPHEN 325 MG PO TABS
650.0000 mg | ORAL_TABLET | ORAL | Status: DC | PRN
  Administered 2020-01-02: 650 mg via ORAL
  Filled 2020-01-02: qty 2

## 2020-01-02 MED ORDER — ADULT MULTIVITAMIN W/MINERALS CH
1.0000 | ORAL_TABLET | Freq: Every day | ORAL | Status: DC
  Administered 2020-01-03: 1 via ORAL
  Filled 2020-01-02: qty 1

## 2020-01-02 MED ORDER — SIMETHICONE 80 MG PO CHEW
80.0000 mg | CHEWABLE_TABLET | Freq: Every day | ORAL | Status: DC
  Administered 2020-01-03: 80 mg via ORAL
  Filled 2020-01-02: qty 1

## 2020-01-02 MED ORDER — ONDANSETRON HCL 4 MG/2ML IJ SOLN: 4.0000 mg | Freq: Four times a day (QID) | INTRAMUSCULAR | Status: DC | PRN

## 2020-01-02 MED ORDER — SERTRALINE HCL 100 MG PO TABS
100.0000 mg | ORAL_TABLET | Freq: Every day | ORAL | Status: DC
  Administered 2020-01-03: 100 mg via ORAL
  Filled 2020-01-02: qty 1

## 2020-01-02 MED ORDER — ALBUTEROL SULFATE HFA 108 (90 BASE) MCG/ACT IN AERS
2.0000 | INHALATION_SPRAY | RESPIRATORY_TRACT | Status: DC | PRN
  Filled 2020-01-02: qty 6.7

## 2020-01-02 MED ORDER — BUDESONIDE 0.5 MG/2ML IN SUSP
0.5000 mg | Freq: Two times a day (BID) | RESPIRATORY_TRACT | Status: DC
  Administered 2020-01-03: 0.5 mg via RESPIRATORY_TRACT
  Filled 2020-01-02 (×2): qty 2

## 2020-01-02 MED ORDER — ENOXAPARIN SODIUM 40 MG/0.4ML ~~LOC~~ SOLN: 40.0000 mg | SUBCUTANEOUS | Status: DC

## 2020-01-02 MED ORDER — LORATADINE 10 MG PO TABS: 10.0000 mg | ORAL_TABLET | Freq: Every day | ORAL | Status: DC

## 2020-01-02 MED ORDER — AMLODIPINE BESYLATE 10 MG PO TABS
10.0000 mg | ORAL_TABLET | Freq: Every day | ORAL | Status: DC
  Administered 2020-01-03: 10 mg via ORAL
  Filled 2020-01-02: qty 1

## 2020-01-02 MED ORDER — ASCORBIC ACID 500 MG PO TABS
1000.0000 mg | ORAL_TABLET | Freq: Every day | ORAL | Status: DC
  Administered 2020-01-03: 1000 mg via ORAL
  Filled 2020-01-02: qty 2

## 2020-01-02 MED ORDER — ASPIRIN 81 MG PO CHEW
243.0000 mg | CHEWABLE_TABLET | Freq: Once | ORAL | Status: AC
  Administered 2020-01-02: 243 mg via ORAL
  Filled 2020-01-02: qty 3

## 2020-01-02 NOTE — H&P (Signed)
History and Physical    Douglas Hansen WUJ:[id number] DOB: 1945/07/06 DOA: 01/02/2020  PCP: [name], MD (Confirm with patient/family/NH records and if not entered, this has to be entered at [facility] point of entry) Patient coming from: Home  I have personally briefly reviewed patient's old medical records in [place]  Chief Complaint: Chest pain  HPI: Douglas Hansen is a 74 y.o. male with medical history significant of HTN, IIDM, hyperlipidemia, severe asthma, recent COVID-19 pneumonia 4 to 6 weeks ago, macular degeneration/diabetic retinopathy, presented with new onset of chest pain.  [facility], been following up with [place] ophthalmologist for left eye problems, was told that he has some retinal problem and started of Aflibercept intraocular injection, and first injection with 5 days ago.  He was told by his ophthalmologist that this injection was have a risk of causing heart attack.  Yesterday morning, patient was at rest and suddenly started to feel left-sided chest pain dull like, radiated to left left shoulder and left arm, last for few minutes, worsening with minimum activity, relieved with resting.  And associated with shortness of breath.  He did not take any pain meds for the chest pain, or 3 times chest pain came back 4-5 times, each episode last few minutes.  No night pain. No other associated problems such as sweating palpitations were nauseous vomiting. ED Course: EKG similar QRS changes in inferior leads,.  Troponin negative.  Chest x-ray showed lung congested and cardiomegaly.  Review of Systems: As per HPI otherwise 14 point review of systems negative.    Past Medical History:  Diagnosis Date  . Agent orange exposure   . Arthritis   . Asthma   . COPD (chronic obstructive pulmonary disease) ([place])   . Diabetes mellitus   . Hyperlipemia   . Hypertension   . OSA on CPAP   . Pneumonia   . PTSD (post-traumatic stress disorder)     Past Surgical History:  Procedure  Laterality Date  . CARDIAC CATHETERIZATION    . right thumb surgery    . TONSILLECTOMY       reports that he quit smoking about 34 years ago. His smoking use included cigarettes. He has a 20.00 pack-year smoking history. He has never used smokeless tobacco. He reports that he does not drink alcohol and does not use drugs.  Allergies  Allergen Reactions  . Codeine Nausea Only and Other (See Comments)    GI upset  . Lipitor [Atorvastatin] Other (See Comments)    "Made my stomach burn"  . Pravachol [Pravastatin Sodium] Other (See Comments)    Caused joint pain    Family History  Problem Relation Age of Onset  . Emphysema Father   . Asthma Father   . Heart disease Father   . Heart attack Father   . Stroke Mother   . Heart attack Brother   . Diabetes Brother   . Heart attack Brother   . Diabetes Brother   . Colon cancer Neg Hx      Prior to Admission medications   Medication Sig Start Date End Date Taking? Authorizing Provider  albuterol (VENTOLIN HFA) 108 (90 Base) MCG/ACT inhaler INHALE 2 PUFFS BY MOUTH EVERY 4 HOURS AS NEEDED FOR WHEEZING OR SHORTNESS OF BREATH Patient taking differently: Inhale 2 puffs into the lungs every 4 (four) hours as needed for wheezing or shortness of breath.  08/22/19  Yes Garnet Sierras, DO  amLODipine (NORVASC) 10 MG tablet Take 10 mg by  mouth daily. 04/06/19  Yes [provider]  ascorbic acid (VITAMIN C) 500 MG tablet Take 1,000 mg by mouth daily.  04/06/19  Yes [provider]  aspirin EC 325 MG tablet Take 325 mg by mouth daily.    Yes [provider]  benzonatate (TESSALON) 200 MG capsule Take 200 mg by mouth 3 (three) times daily. 12/26/19  Yes [provider]  budesonide-formoterol (SYMBICORT) 80-4.5 MCG/ACT inhaler Inhale 2 puffs into the lungs 2 (two) times daily.  10/31/19  Yes [provider]  Carbinoxamine Maleate 4 MG TABS Take 1 tablet (4 mg total) by mouth 2 (two) times daily as needed  (allergies). Patient taking differently: Take 1 tablet by mouth daily.  12/18/19  Yes Garnet Sierras, DO  carvedilol (COREG) 12.5 MG tablet Take 6.25 mg by mouth 2 (two) times daily with a meal.  11/09/19  Yes [provider]  Cellulose Carmellose Sodium POWD Apply 1 application topically daily as needed (irritation). Apply to feet   Yes [provider]  Cholecalciferol (VITAMIN D-3 PO) Take 1 capsule by mouth daily.   Yes [provider]  ciclesonide (ALVESCO) 80 MCG/ACT inhaler Inhale 2 puffs into the lungs 2 (two) times daily.    Yes [provider]  clobetasol ointment (TEMOVATE) 6.64 % Apply 1 application topically 2 (two) times daily as needed (to affected area).  11/13/19  Yes [provider]  Diclofenac Sodium 1.5 % SOLN Apply 4 drops topically 4 (four) times daily as needed (as directed- to affected area).  10/17/14  Yes [provider]  EPINEPHrine 0.3 mg/0.3 mL IJ SOAJ injection Inject 0.3 mLs (0.3 mg total) into the muscle as needed for anaphylaxis. 11/16/19  Yes Padgett, Rae Halsted, MD  EYLEA 2 MG/0.05ML SOLN Place into the left eye every 30 (thirty) days. INJECTION 04/17/19  Yes [provider]  Fluocinolone Acetonide Body 0.01 % OIL Apply 1 application topically See admin instructions. Apply a small amount to affected area daily 11/09/19  Yes [provider]  fluticasone (FLONASE) 50 MCG/ACT nasal spray Place 1 spray into both nostrils daily. 12/07/19  Yes Hall-Potvin, [place], PA-C  furosemide (LASIX) 40 MG tablet Take 40 mg by mouth daily. 12/01/19  Yes [provider]  ketoconazole (NIZORAL) 2 % shampoo Apply 1 application topically 3 (three) times a week.   Yes [provider]  mepolizumab (NUCALA) 100 MG/ML SOSY Inject 100 mg into the skin every 28 (twenty-eight) days.   Yes [provider]  metFORMIN (GLUCOPHAGE) 500 MG tablet Take 250 mg by mouth 2 (two) times daily as needed (for elevated  BGL).   Yes [provider]  montelukast (SINGULAIR) 10 MG tablet Take 10 mg by mouth at bedtime.  04/06/19  Yes [provider]  Multiple Vitamins-Minerals (MULTIVITAMIN & MINERAL PO) Take 1 tablet by mouth daily. Reported on 06/17/2015   Yes [provider]  omeprazole (PRILOSEC) 20 MG capsule Take 20 mg by mouth daily before breakfast. 11/13/19  Yes [provider]  rosuvastatin (CRESTOR) 20 MG tablet Take 10 mg by mouth at bedtime.  11/09/19  Yes [provider]  sertraline (ZOLOFT) 100 MG tablet Take 100 mg by mouth daily. 04/05/19  Yes [provider]  simethicone (MYLICON) 80 MG chewable tablet Chew 80 mg by mouth daily.    Yes [provider]  Tiotropium Bromide Monohydrate (SPIRIVA RESPIMAT) 2.5 MCG/ACT AERS Inhale 2 puffs one daily 08/21/19  Yes Garnet Sierras, DO  benzonatate (  TESSALON PERLES) 100 MG capsule Take 1 capsule (100 mg total) by mouth every 6 (six) hours as needed for cough. Patient not taking: Reported on 01/02/2020 11/24/19 11/23/20  Jonetta Osgood, MD  OneTouch Delica Lancets 67M MISC daily. 09/01/19   [provider]  [facility] ULTRA test strip as needed.  09/01/19   [provider]  Respiratory Therapy Supplies (FLUTTER) DEVI Use as directed 04/20/16   Bobbitt, [place] Muta, MD  doxazosin (CARDURA) 1 MG tablet Take 1 mg by mouth daily. 04/03/19 01/02/20  [provider]    Physical Exam: Vitals:   01/02/20 1546  BP: 140/71  Pulse: 73  Resp: 16  Temp: 98.6 F (37 C)  TempSrc: Oral  SpO2: 94%  Weight: 85.7 kg  Height: 5\' 6"  (1.676 m)    Constitutional: NAD, calm, comfortable Vitals:   01/02/20 1546  BP: 140/71  Pulse: 73  Resp: 16  Temp: 98.6 F (37 C)  TempSrc: Oral  SpO2: 94%  Weight: 85.7 kg  Height: 5\' 6"  (1.676 m)   Eyes: PERRL, lids and conjunctivae normal ENMT: Mucous membranes are moist. Posterior pharynx clear of any exudate or lesions.Normal dentition.  Neck:  normal, supple, no masses, no thyromegaly Respiratory: clear to auscultation bilaterally, no wheezing, fine crackles on B/L bases. Normal respiratory effort. No accessory muscle use.  Cardiovascular: Regular rate and rhythm, no murmurs / rubs / gallops. No extremity edema. 2+ pedal pulses. No carotid bruits.  Abdomen: no tenderness, no masses palpated. No hepatosplenomegaly. Bowel sounds positive.  Musculoskeletal: no clubbing / cyanosis. No joint deformity upper and lower extremities. Good ROM, no contractures. Normal muscle tone.  Skin: no rashes, lesions, ulcers. No induration Neurologic: CN 2-12 grossly intact. Sensation intact, DTR normal. Strength 5/5 in all 4.  Psychiatric: Normal judgment and insight. Alert and oriented x 3. Normal mood.     Labs on Admission: I have personally reviewed following labs and imaging studies  CBC: Recent Labs  Lab 01/02/20 1552  WBC 5.4  HGB 12.6*  HCT 39.7  MCV 93.6  PLT 094   Basic Metabolic Panel: Recent Labs  Lab 01/02/20 1552  NA 141  K 3.5  CL 102  CO2 31  GLUCOSE 101*  BUN 10  CREATININE 0.89  CALCIUM 10.2   GFR: Estimated Creatinine Clearance: 72.5 mL/min (by C-G formula based on SCr of 0.89 mg/dL). Liver Function Tests: No results for input(s): AST, ALT, ALKPHOS, BILITOT, PROT, ALBUMIN in the last 168 hours. No results for input(s): LIPASE, AMYLASE in the last 168 hours. No results for input(s): AMMONIA in the last 168 hours. Coagulation Profile: No results for input(s): INR, PROTIME in the last 168 hours. Cardiac Enzymes: No results for input(s): CKTOTAL, CKMB, CKMBINDEX, TROPONINI in the last 168 hours. BNP (last 3 results) No results for input(s): PROBNP in the last 8760 hours. HbA1C: No results for input(s): HGBA1C in the last 72 hours. CBG: No results for input(s): GLUCAP in the last 168 hours. Lipid Profile: No results for input(s): CHOL, HDL, LDLCALC, TRIG, CHOLHDL, LDLDIRECT in the last 72 hours. Thyroid  Function Tests: No results for input(s): TSH, T4TOTAL, FREET4, T3FREE, THYROIDAB in the last 72 hours. Anemia Panel: No results for input(s): VITAMINB12, FOLATE, FERRITIN, TIBC, IRON, RETICCTPCT in the last 72 hours. Urine analysis: No results found for: COLORURINE, APPEARANCEUR, [place], [place], GLUCOSEU, HGBUR, BILIRUBINUR, KETONESUR, PROTEINUR, UROBILINOGEN, NITRITE, LEUKOCYTESUR  Radiological Exams on Admission: DG Chest 2 View  Result Date: 01/02/2020 CLINICAL DATA:  Short of breath  with cough EXAM: CHEST - 2 VIEW COMPARISON:  11/22/2019 FINDINGS: No significant pleural effusion. Streaky left lung base opacity. Thickening along the right fissure as before. Low lung volumes. Stable cardiomediastinal silhouette. No pneumothorax. IMPRESSION: Low lung volumes with streaky atelectasis or or pneumonia at the left lung base. Electronically Signed   By: Donavan Foil M.D.   On: 01/02/2020 16:17    EKG: Independently reviewed.  Q waves on inferior leads which is chronic.  Assessment/Plan Active Problems:   Chest pain  (please populate well all problems here in Problem List. (For example, if patient is on BP meds at home and you resume or decide to hold them, it is a problem that needs to be her. Same for CAD, COPD, HLD and so on)  Chest pain rule out ACS -We will second set of troponins.  Ordered echocardiogram -Given rather close time interval of VEGF antagonist treatment and onset of chest pain, suspect patient may have underlying CAD.  Order a CTA of coronary artery, as patient has poor exercise tolerance and severe asthma/COPD, and adenosine is contraindicated. -Continue aspirin and statin  Acute on chronic diastolic CHF decompensation -Has symptoms of shortness of breath and physical exam showed bilateral base crackles, Lasix 40 mg IV x1 and then restart p.o. Lasix tomorrow -Patient was started on Lasix by his PCP 6 months ago, at that time, it was echocardiogram showed LVEF 65% with  grade 1 diastolic dysfunction.  Hypertension -Controlled, continue home regimen  IIDM -We will have the CT angiogram noncontributory tomorrow, so hold Metformin for 72 hours.  HLD -Continue statin  Severe asthma -No symptoms signs of acute exacerbation  Diabetic retinopathy vs macular degeneration -Recommend patient discuss his condition with his ophthalmologist at [facility] regarding his episode of chest pain.  DVT prophylaxis: Lovenox  code Status: Full code Family Communication: None at bedside Disposition Plan: Expect less than 2 midnight hospital stay, likely can be discharged after CT angiogram acute artery tomorrow Consults called: None Admission status: Telemetry observation   Lequita Halt MD [facility] Pager [phone]  01/02/2020, 6:55 PM

## 2020-01-02 NOTE — ED Triage Notes (Addendum)
Pt reports chest pain since yesterday, worse with movement. Pt also reports some sob and a headache. Pt a.o, nad noted. Pt was treated for COVID pneumonia in September of this year.

## 2020-01-02 NOTE — ED Notes (Signed)
Dr. Roosevelt Locks at bedside, verbal order to not covid swab pt for admitting purposes. Pt was Covid + 12/06/2019

## 2020-01-02 NOTE — Discharge Instructions (Signed)
74 year old male comes in for 2-day history of left-sided chest pain.  This is intermittent, can be associated with shortness of breath.  Mild nausea without vomiting.  Chest pain is exertional, relieved with rest.  He does have some left shoulder pain that is exacerbated by arm movement.  Has baseline cough that has not worsened.  Denies fever.  EKG NSR 72, no acute ST changes. No significant changes from prior EKG.   Left shoulder pain reproducible by palpation.  Chest pain is not reproducible by palpation.  Given left-sided chest pain with shortness of breath that is exertional, discharged in stable condition to the ED for further evaluation.

## 2020-01-02 NOTE — ED Notes (Signed)
Pt reports ongoing SOB with exertion and CP with movement or deep breathing since being dx with Covid in September

## 2020-01-02 NOTE — ED Triage Notes (Signed)
Patient in with complaints of left sided chest pain that goes into left shoulder that started on yesterday. Patient also has complaints of SOB that started on yesterday.Patient states that the pain comes and goes. Describes the chest pain as dull.

## 2020-01-02 NOTE — ED Provider Notes (Signed)
[facility] EMERGENCY DEPARTMENT Provider Note   CSN: [id number] Arrival date & time: 01/02/20  1539     History Chief Complaint  Patient presents with  . Chest Pain    Douglas Hansen is a 74 y.o. male history of COPD, diabetes, hypertension, hyperlipidemia, OSA, obesity, agent orange exposure.  Patient presents today for chest pain shortness of breath.  He reports he has had some mild shortness of breath with exertion over the past few weeks.  Yesterday he was sitting at home watching TV he got up to go use the bathroom when he had worsening shortness of breath he had a sit down to rest.  After some time he got back up again and while ambulating had both shortness of breath as well as chest pain which she describes as a dull left-sided chest pain rating up to his left shoulder worsened with exertion improved with rest, moderate intensity.  He reports some mild continued pain today.  Associated with nausea.  Additionally patient reports chronic cough since he had COVID-19 in September 2021.  Denies fever/chills, fall/injury, hemoptysis, abdominal pain, vomiting, diarrhea, extremity swelling/color change or any additional concerns.  HPI     Past Medical History:  Diagnosis Date  . Agent orange exposure   . Arthritis   . Asthma   . COPD (chronic obstructive pulmonary disease) ([place])   . Diabetes mellitus   . Hyperlipemia   . Hypertension   . OSA on CPAP   . Pneumonia   . PTSD (post-traumatic stress disorder)     Patient Active Problem List   Diagnosis Date Noted  . Type 2 diabetes mellitus without complication ([place]) 09/73/5329  . Hypoxia 11/22/2019  . Pneumonia due to COVID-19 virus 11/22/2019  . Dyslipidemia 06/03/2016  . Other fatigue 06/03/2016  . Claudication ([place]) 06/03/2016  . Acute sinusitis 12/16/2015  . Asthma with acute exacerbation 06/17/2015  . Acute bronchitis 06/17/2015  . Gastroesophageal reflux disease without esophagitis 06/17/2015  .  Shortness of breath 04/17/2015  . Acute pulmonary edema ([place]) 04/17/2015  . Chronic obstructive pulmonary disease, unspecified copd, unspecified chronic bronchitis type 01/07/2015  . Moderate persistent asthma 01/07/2015  . Seasonal and perennial allergic rhinitis 01/07/2015  . Obstructive sleep apnea treated with continuous positive airway pressure (CPAP) 01/07/2015  . Asthma-COPD overlap syndrome (HCC)     Class: Chronic  . Pneumonia due to other gram-negative bacteria ([place]) 01/30/2011  . Cough 06/09/2010  . COPD (chronic obstructive pulmonary disease) ([place]) 06/09/2010  . OSA on CPAP   . Diabetes mellitus   . Hyperlipemia   . Essential hypertension     Past Surgical History:  Procedure Laterality Date  . CARDIAC CATHETERIZATION    . right thumb surgery    . TONSILLECTOMY         Family History  Problem Relation Age of Onset  . Emphysema Father   . Asthma Father   . Heart disease Father   . Heart attack Father   . Stroke Mother   . Heart attack Brother   . Diabetes Brother   . Heart attack Brother   . Diabetes Brother   . Colon cancer Neg Hx     Social History   Tobacco Use  . Smoking status: Former Smoker    Packs/day: 1.00    Years: 20.00    Pack years: 20.00    Types: Cigarettes    Quit date: 03/09/1985    Years since quitting: 34.8  . Smokeless tobacco: Never Used  Vaping Use  . Vaping Use: Never used  Substance Use Topics  . Alcohol use: No  . Drug use: No    Home Medications Prior to Admission medications   Medication Sig Start Date End Date Taking? Authorizing Provider  albuterol (VENTOLIN HFA) 108 (90 Base) MCG/ACT inhaler INHALE 2 PUFFS BY MOUTH EVERY 4 HOURS AS NEEDED FOR WHEEZING OR SHORTNESS OF BREATH Patient taking differently: Inhale 2 puffs into the lungs every 4 (four) hours as needed for wheezing or shortness of breath.  08/22/19   Garnet Sierras, DO  amLODipine (NORVASC) 10 MG tablet Take 10 mg by mouth daily. 04/06/19   [provider]  ascorbic acid (VITAMIN C) 500 MG tablet Take 1,000 mg by mouth daily.  04/06/19   [provider]  aspirin EC 325 MG tablet Take 325 mg by mouth daily.     [provider]  benzonatate (TESSALON PERLES) 100 MG capsule Take 1 capsule (100 mg total) by mouth every 6 (six) hours as needed for cough. 11/24/19 11/23/20  Ghimire, Henreitta Leber, MD  budesonide-formoterol (SYMBICORT) 80-4.5 MCG/ACT inhaler Inhale 2 puffs into the lungs 2 (two) times daily.  10/31/19   [provider]  Carbinoxamine Maleate 4 MG TABS Take 1 tablet (4 mg total) by mouth 2 (two) times daily as needed (allergies). 12/18/19   Garnet Sierras, DO  carvedilol (COREG) 12.5 MG tablet Take 12.5 mg by mouth 2 (two) times daily with a meal.  11/09/19   [provider]  Cellulose Carmellose Sodium POWD Apply 1 application topically daily as needed (irritation). Apply to feet    [provider]  Cholecalciferol (VITAMIN D-3 PO) Take 1 capsule by mouth daily.    [provider]  ciclesonide (ALVESCO) 80 MCG/ACT inhaler Inhale 2 puffs into the lungs 2 (two) times daily.     [provider]  clobetasol ointment (TEMOVATE) 9.50 % Apply 1 application topically 2 (two) times daily as needed (to affected area).  11/13/19   [provider]  Diclofenac Sodium 1.5 % SOLN Apply 4 drops topically 4 (four) times daily as needed (as directed- to affected area).  10/17/14   [provider]  EPINEPHrine 0.3 mg/0.3 mL IJ SOAJ injection Inject 0.3 mLs (0.3 mg total) into the muscle as needed for anaphylaxis. 11/16/19   Kennith Gain, MD  EYLEA 2 MG/0.05ML SOLN Place into the left eye every 30 (thirty) days. INJECTION 04/17/19   [provider]  Fluocinolone Acetonide Body 0.01 % OIL Apply 1 application topically See admin instructions. Apply a small amount to affected area daily 11/09/19   [provider]  fluticasone (FLONASE) 50 MCG/ACT nasal spray Place 1 spray  into both nostrils daily. 12/07/19   Hall-Potvin, [place], PA-C  furosemide (LASIX) 40 MG tablet Take 40 mg by mouth daily. 12/01/19   [provider]  ketoconazole (NIZORAL) 2 % shampoo Apply 1 application topically 3 (three) times a week.    [provider]  mepolizumab (NUCALA) 100 MG/ML SOSY Inject 100 mg into the skin every 28 (twenty-eight) days.    [provider]  metFORMIN (GLUCOPHAGE) 500 MG tablet Take 250 mg by mouth 2 (two) times daily as needed (for elevated BGL).    [provider]  montelukast (SINGULAIR) 10 MG tablet Take 10 mg by mouth at bedtime.  04/06/19   [provider]  Multiple Vitamins-Minerals (MULTIVITAMIN & MINERAL PO) Take 1 tablet by mouth daily. Reported on 06/17/2015  [provider]  omeprazole (PRILOSEC) 20 MG capsule Take 20 mg by mouth daily before breakfast. 11/13/19   [provider]  OneTouch Delica Lancets 16L MISC daily. 09/01/19   [provider]  [facility] ULTRA test strip as needed.  09/01/19   [provider]  Respiratory Therapy Supplies (FLUTTER) DEVI Use as directed 04/20/16   Bobbitt, [place] Muta, MD  rosuvastatin (CRESTOR) 20 MG tablet Take 10 mg by mouth at bedtime.  11/09/19   [provider]  sertraline (ZOLOFT) 100 MG tablet Take 100 mg by mouth daily. 04/05/19   [provider]  simethicone (MYLICON) 80 MG chewable tablet Chew 80 mg by mouth 2 (two) times daily as needed for flatulence.    [provider]  Tiotropium Bromide Monohydrate (SPIRIVA RESPIMAT) 2.5 MCG/ACT AERS Inhale 2 puffs one daily 08/21/19   Garnet Sierras, DO  doxazosin (CARDURA) 1 MG tablet Take 1 mg by mouth daily. 04/03/19 01/02/20  [provider]    Allergies    Codeine, Lipitor [atorvastatin], and Pravachol [pravastatin sodium]  Review of Systems   Review of Systems Ten systems are reviewed and are negative for acute change except as noted in the HPI  Physical  Exam Updated Vital Signs BP 140/71   Pulse 73   Temp 98.6 F (37 C) (Oral)   Resp 16   Ht 5\' 6"  (1.676 m)   Wt 85.7 kg   SpO2 94%   BMI 30.51 kg/m   Physical Exam Constitutional:      General: He is not in acute distress.    Appearance: Normal appearance. He is well-developed. He is not ill-appearing or diaphoretic.  HENT:     Head: Normocephalic and atraumatic.  Eyes:     General: Vision grossly intact. Gaze aligned appropriately.     Pupils: Pupils are equal, round, and reactive to light.  Neck:     Trachea: Trachea and phonation normal.  Cardiovascular:     Rate and Rhythm: Normal rate and regular rhythm.  Pulmonary:     Effort: Pulmonary effort is normal. No accessory muscle usage or respiratory distress.  Abdominal:     General: There is no distension.     Palpations: Abdomen is soft.     Tenderness: There is no abdominal tenderness. There is no guarding or rebound.  Musculoskeletal:        General: Normal range of motion.     Cervical back: Normal range of motion.  Skin:    General: Skin is warm and dry.  Neurological:     Mental Status: He is alert.     GCS: GCS eye subscore is 4. GCS verbal subscore is 5. GCS motor subscore is 6.     Comments: Speech is clear and goal oriented, follows commands Major Cranial nerves without deficit, no facial droop Moves extremities without ataxia, coordination intact  Psychiatric:        Behavior: Behavior normal.     ED Results / Procedures / Treatments   Labs (all labs ordered are listed, but only abnormal results are displayed) Labs Reviewed  BASIC METABOLIC PANEL - Abnormal; Notable for the following components:      Result Value   Glucose, Bld 101 (*)    All other components within normal limits  CBC - Abnormal; Notable for the following components:   Hemoglobin 12.6 (*)    All other components within normal limits  BRAIN NATRIURETIC PEPTIDE  TROPONIN I (HIGH SENSITIVITY)  TROPONIN I (HIGH  SENSITIVITY)     EKG EKG Interpretation  Date/Time:  Tuesday January 02 2020 15:39:04 EDT Ventricular Rate:  74 PR Interval:  170 QRS Duration: 76 QT Interval:  398 QTC Calculation: 441 R Axis:   40 Text Interpretation: Normal sinus rhythm Inferior infarct , age undetermined Anteroseptal infarct , age undetermined Abnormal ECG no acute stemi Confirmed by Madalyn Rob [id number]) on 01/02/2020 3:54:15 PM   Radiology DG Chest 2 View  Result Date: 01/02/2020 CLINICAL DATA:  Short of breath with cough EXAM: CHEST - 2 VIEW COMPARISON:  11/22/2019 FINDINGS: No significant pleural effusion. Streaky left lung base opacity. Thickening along the right fissure as before. Low lung volumes. Stable cardiomediastinal silhouette. No pneumothorax. IMPRESSION: Low lung volumes with streaky atelectasis or or pneumonia at the left lung base. Electronically Signed   By: Donavan Foil M.D.   On: 01/02/2020 16:17    Procedures Procedures (including critical care time)  Medications Ordered in ED Medications  nitroGLYCERIN (NITROSTAT) SL tablet 0.4 mg (has no administration in time range)  aspirin chewable tablet 243 mg (243 mg Oral Given 01/02/20 1659)    ED Course  I have reviewed the triage vital signs and the nursing notes.  Pertinent labs & imaging results that were available during my care of the patient were reviewed by me and considered in my medical decision making (see chart for details).    MDM Rules/Calculators/A&P                         Additional history obtained from: 1. Nursing notes from this visit. 2. Review of electronic medical records. ------------------------------- I ordered, reviewed and interpreted labs which include: High-sensitivity troponin within normal limits CBC shows mild anemia of 12.6, no leukocytosis to suggest infectious process. BMP shows no emergent electrolyte derangement, AKI or gap.  CXR:  IMPRESSION:  Low lung volumes with streaky atelectasis or or pneumonia at  the  left lung base.   EKG: Normal sinus rhythm Inferior infarct , age undetermined Anteroseptal infarct , age undetermined Abnormal ECG no acute stemi Confirmed by Madalyn Rob [phone]) on 01/02/2020 3:54:15 PM - Patient was given the remainder of his 324 mg aspirin today.  Additionally given sublingual nitro for chest pain.  He will need admission for chest pain, heart score 7.  Patient was seen and evaluated by Dr. Tomi Bamberger during this visit who agrees with plan of care.  History and workup inconsistent with PE, dissection, bacterial pneumonia or other emergent cardiopulmonary etiologies at this time. - On reevaluation patient is resting comfortably no acute distress reports pain-free as he is lying in the bed. Vital signs stable on room air. Patient is agreeable for admission no additional concerns at this time. - Consult with Dr. Roosevelt Locks, patient accepted to hospitalist service.    Note: Portions of this report may have been transcribed using voice recognition software. Every effort was made to ensure accuracy; however, inadvertent computerized transcription errors may still be present. Final Clinical Impression(s) / ED Diagnoses Final diagnoses:  Chest pain, unspecified type    Rx / DC Orders ED Discharge Orders    None       Gari Crown 01/02/20 1901    Dorie Rank, MD 01/03/20 1551

## 2020-01-02 NOTE — ED Notes (Signed)
Pt requesting to hold Lasix until getting upstairs.

## 2020-01-02 NOTE — ED Provider Notes (Signed)
74 year old male comes in for 2-day history of left-sided chest pain.  This is intermittent, can be associated with shortness of breath.  Mild nausea without vomiting.  Chest pain is exertional, relieved with rest.  He does have some left shoulder pain that is exacerbated by arm movement.  Has baseline cough that has not worsened.  Denies fever.  EKG NSR 72, no acute ST changes. No significant changes from prior EKG.   Left shoulder pain reproducible by palpation.  Chest pain is not reproducible by palpation.  Given left-sided chest pain with shortness of breath that is exertional, discharged in stable condition to the ED for further evaluation.    Ok Edwards, PA-C 01/02/20 1513

## 2020-01-03 ENCOUNTER — Observation Stay (HOSPITAL_BASED_OUTPATIENT_CLINIC_OR_DEPARTMENT_OTHER): Payer: Medicare Other

## 2020-01-03 ENCOUNTER — Observation Stay (HOSPITAL_COMMUNITY): Payer: Medicare Other

## 2020-01-03 DIAGNOSIS — Z794 Long term (current) use of insulin: Secondary | ICD-10-CM

## 2020-01-03 DIAGNOSIS — R079 Chest pain, unspecified: Secondary | ICD-10-CM

## 2020-01-03 DIAGNOSIS — R072 Precordial pain: Secondary | ICD-10-CM | POA: Diagnosis not present

## 2020-01-03 DIAGNOSIS — Q211 Atrial septal defect: Secondary | ICD-10-CM | POA: Diagnosis not present

## 2020-01-03 DIAGNOSIS — E119 Type 2 diabetes mellitus without complications: Secondary | ICD-10-CM | POA: Diagnosis not present

## 2020-01-03 DIAGNOSIS — I2510 Atherosclerotic heart disease of native coronary artery without angina pectoris: Secondary | ICD-10-CM | POA: Diagnosis not present

## 2020-01-03 DIAGNOSIS — I10 Essential (primary) hypertension: Secondary | ICD-10-CM

## 2020-01-03 DIAGNOSIS — I5032 Chronic diastolic (congestive) heart failure: Secondary | ICD-10-CM | POA: Diagnosis not present

## 2020-01-03 LAB — MRSA PCR SCREENING: MRSA by PCR: NEGATIVE

## 2020-01-03 LAB — ECHOCARDIOGRAM COMPLETE
Area-P 1/2: 2.69 cm2
Calc EF: 68.3 %
Height: 66 in
S' Lateral: 2.1 cm
Single Plane A2C EF: 71.8 %
Single Plane A4C EF: 67.1 %
Weight: 3082.91 oz

## 2020-01-03 LAB — GLUCOSE, CAPILLARY
Glucose-Capillary: 124 mg/dL — ABNORMAL HIGH (ref 70–99)
Glucose-Capillary: 106 mg/dL — ABNORMAL HIGH (ref 70–99)
Glucose-Capillary: 104 mg/dL — ABNORMAL HIGH (ref 70–99)

## 2020-01-03 LAB — HEMOGLOBIN A1C
Hgb A1c MFr Bld: 6.7 % — ABNORMAL HIGH (ref 4.8–5.6)
Mean Plasma Glucose: 146 mg/dL

## 2020-01-03 MED ORDER — IOHEXOL 350 MG/ML SOLN
80.0000 mL | Freq: Once | INTRAVENOUS | Status: AC | PRN
  Administered 2020-01-03: 80 mL via INTRAVENOUS

## 2020-01-03 MED ORDER — NITROGLYCERIN 0.4 MG SL SUBL: 0.8000 mg | SUBLINGUAL_TABLET | Freq: Once | SUBLINGUAL | Status: AC

## 2020-01-03 MED ORDER — FENTANYL CITRATE (PF) 100 MCG/2ML IJ SOLN
25.0000 ug | Freq: Once | INTRAMUSCULAR | Status: AC
  Administered 2020-01-03: 25 ug via INTRAVENOUS
  Filled 2020-01-03: qty 2

## 2020-01-03 MED ORDER — METOPROLOL TARTRATE 5 MG/5ML IV SOLN: 2.5000 mg | Freq: Once | INTRAVENOUS | Status: DC | PRN

## 2020-01-03 MED ORDER — NITROGLYCERIN 0.4 MG SL SUBL
SUBLINGUAL_TABLET | SUBLINGUAL | Status: AC
  Administered 2020-01-03: 0.8 mg via SUBLINGUAL
  Filled 2020-01-03: qty 2

## 2020-01-03 MED ORDER — METOPROLOL TARTRATE 50 MG PO TABS
50.0000 mg | ORAL_TABLET | Freq: Once | ORAL | Status: AC | PRN
  Administered 2020-01-03: 50 mg via ORAL
  Filled 2020-01-03: qty 1

## 2020-01-03 NOTE — Progress Notes (Signed)
  Echocardiogram 2D Echocardiogram has been performed.  Douglas Hansen 01/03/2020, 8:47 AM

## 2020-01-03 NOTE — Discharge Summary (Signed)
Physician Discharge Summary  Osiris Odriscoll HCW:[id number] DOB: 28-Dec-1945 DOA: 01/02/2020  PCP: [name], MD  Admit date: 01/02/2020 Discharge date: 01/03/2020  Admitted From: Home Disposition: Home  Recommendations for Outpatient Follow-up:  1. Follow ups as below. 2. Please obtain CBC/BMP/Mag at follow up 3. Please follow up on the following pending results: Formal cardiology read on CT coronary  Home Health: None required Equipment/Devices: None required  Discharge Condition: Stable CODE STATUS: Full code   Hospital Course: 74 year old male with history of IDDM-2 with retinopathy, HTN, severe asthma, recent COVID-19 pneumonia and HLD presenting with acute left-sided chest pain started at rest with associated dyspnea.  He was admitted for chest pain to rule out ACS.  In ED, EKG without acute ischemic finding.  High-sensitivity troponin negative x2.  BNP was normal.  CXR with mild lung congestion and cardiomegaly.  Echocardiogram and CT coronary artery ordered, and admitted for chest pain and ACS rule out.  The next day, chest pain improved tremendously.  Only some pain with certain body movements.  No dyspnea.  Echocardiogram with moderate to severe LVH.  Coronary calcium score of 209 which was 44 percentile for age and sex matched control, moderate stenosis of proximal RCA and proximal portion of D1. Per Cardiology, Dr. Meda Coffee okay to discharge patient.  CT FFR to be added.  Patient was advised to follow-up with his cardiologist and PCP in 1 to 2 weeks.  Discharge Diagnoses:  Atypical chest pain: unlikely ACS based on history, serial troponin, EKG, echo and CT.  Chest pain basically resolved.  -Outpatient follow-up with cardiology -Continue home medications -Recommended extra strength Tylenol in addition to full dose aspirin he is taking.  Chronic diastolic CHF: Echo with normal LVEF but moderate to severe concentric LVH.  Received IV Lasix.  He appears euvolemic on exam.   Denies dyspnea.  Saturating at 98% on room air. -Discharged on home p.o. Lasix. -Outpatient follow-up with his cardiologist  Other medical conditions including HTN, IDDM-2, HLD, severe asthma and diabetic retinopathy-stable -Continue home medications.   Body mass index is 31.1 kg/m.            Discharge Exam: Vitals:   01/03/20 1144 01/03/20 1631  BP:  137/84  Pulse: 87   Resp:  15  Temp:  98.2 F (36.8 C)  SpO2:  93%    GENERAL: No apparent distress.  Nontoxic. HEENT: MMM.  Vision and hearing grossly intact.  NECK: Supple.  No apparent JVD.  RESP: 98% on room air.  No IWOB.  Fair aeration bilaterally. CVS:  RRR. Heart sounds normal.  ABD/GI/GU: Bowel sounds present. Soft. Non tender.  MSK/EXT:  Moves extremities. No apparent deformity. No edema.  SKIN: no apparent skin lesion or wound NEURO: Awake, alert and oriented appropriately.  No apparent focal neuro deficit. PSYCH: Calm. Normal affect.  Discharge Instructions  Discharge Instructions    (HEART FAILURE PATIENTS) Call MD:  Anytime you have any of the following symptoms: 1) 3 pound weight gain in 24 hours or 5 pounds in 1 week 2) shortness of breath, with or without a dry hacking cough 3) swelling in the hands, feet or stomach 4) if you have to sleep on extra pillows at night in order to breathe.   Complete by: As directed    Call MD for:  difficulty breathing, headache or visual disturbances   Complete by: As directed    Call MD for:  extreme fatigue   Complete by: As directed  Call MD for:  persistant dizziness or light-headedness   Complete by: As directed    Call MD for:  severe uncontrolled pain   Complete by: As directed    Call MD for:  temperature >100.4   Complete by: As directed    Diet - low sodium heart healthy   Complete by: As directed    Diet Carb Modified   Complete by: As directed    Discharge instructions   Complete by: As directed    It has been a pleasure taking care of you!  You  were hospitalized due to chest pain.  After the test is we have done, it is unlikely that your chest pain is related to your heart or your lung. You may try extra strength Tylenol to help with pain.  Follow-up with your primary care doctor and cardiologist if no significant improvement in your chest pain over the next few days or other symptoms concerning to you.   We may have started you on other new medications or made some changes to your home medications during this hospitalization. Please review your new medication list and the directions carefully before you take them.   Take care,   Increase activity slowly   Complete by: As directed      Allergies as of 01/03/2020      Reactions   Codeine Nausea Only, Other (See Comments)   GI upset   Lipitor [atorvastatin] Other (See Comments)   "Made my stomach burn"   Pravachol [pravastatin Sodium] Other (See Comments)   Caused joint pain      Medication List    TAKE these medications   albuterol 108 (90 Base) MCG/ACT inhaler Commonly known as: VENTOLIN HFA INHALE 2 PUFFS BY MOUTH EVERY 4 HOURS AS NEEDED FOR WHEEZING OR SHORTNESS OF BREATH What changed: See the new instructions.   amLODipine 10 MG tablet Commonly known as: NORVASC Take 10 mg by mouth daily.   ascorbic acid 500 MG tablet Commonly known as: VITAMIN C Take 1,000 mg by mouth daily.   aspirin EC 325 MG tablet Take 325 mg by mouth daily.   benzonatate 100 MG capsule Commonly known as: Tessalon Perles Take 1 capsule (100 mg total) by mouth every 6 (six) hours as needed for cough.   benzonatate 200 MG capsule Commonly known as: TESSALON Take 200 mg by mouth 3 (three) times daily.   budesonide-formoterol 80-4.5 MCG/ACT inhaler Commonly known as: SYMBICORT Inhale 2 puffs into the lungs 2 (two) times daily.   Carbinoxamine Maleate 4 MG Tabs Take 1 tablet (4 mg total) by mouth 2 (two) times daily as needed (allergies). What changed: when to take this    carvedilol 12.5 MG tablet Commonly known as: COREG Take 6.25 mg by mouth 2 (two) times daily with a meal.   Cellulose Carmellose Sodium Powd Apply 1 application topically daily as needed (irritation). Apply to feet   ciclesonide 80 MCG/ACT inhaler Commonly known as: ALVESCO Inhale 2 puffs into the lungs 2 (two) times daily.   clobetasol ointment 0.05 % Commonly known as: TEMOVATE Apply 1 application topically 2 (two) times daily as needed (to affected area).   Diclofenac Sodium 1.5 % Soln Apply 4 drops topically 4 (four) times daily as needed (as directed- to affected area).   EPINEPHrine 0.3 mg/0.3 mL Soaj injection Commonly known as: EPI-PEN Inject 0.3 mLs (0.3 mg total) into the muscle as needed for anaphylaxis.   Eylea 2 MG/0.05ML Soln Generic drug: Aflibercept Place into the  left eye every 30 (thirty) days. INJECTION   Fluocinolone Acetonide Body 0.01 % Oil Apply 1 application topically See admin instructions. Apply a small amount to affected area daily   fluticasone 50 MCG/ACT nasal spray Commonly known as: FLONASE Place 1 spray into both nostrils daily.   Flutter Devi Use as directed   furosemide 40 MG tablet Commonly known as: LASIX Take 40 mg by mouth daily.   ketoconazole 2 % shampoo Commonly known as: NIZORAL Apply 1 application topically 3 (three) times a week.   metFORMIN 500 MG tablet Commonly known as: GLUCOPHAGE Take 250 mg by mouth 2 (two) times daily as needed (for elevated BGL).   montelukast 10 MG tablet Commonly known as: SINGULAIR Take 10 mg by mouth at bedtime.   MULTIVITAMIN & MINERAL PO Take 1 tablet by mouth daily. Reported on 06/17/2015   Nucala 100 MG/ML Sosy Generic drug: mepolizumab Inject 100 mg into the skin every 28 (twenty-eight) days.   omeprazole 20 MG capsule Commonly known as: PRILOSEC Take 20 mg by mouth daily before breakfast.   OneTouch Delica Lancets 27C Misc daily.   OneTouch Ultra test strip Generic drug:  glucose blood as needed.   rosuvastatin 20 MG tablet Commonly known as: CRESTOR Take 10 mg by mouth at bedtime.   sertraline 100 MG tablet Commonly known as: ZOLOFT Take 100 mg by mouth daily.   simethicone 80 MG chewable tablet Commonly known as: MYLICON Chew 80 mg by mouth daily.   Spiriva Respimat 2.5 MCG/ACT Aers Generic drug: Tiotropium Bromide Monohydrate Inhale 2 puffs one daily   VITAMIN D-3 PO Take 1 capsule by mouth daily.       Consultations:  None  Procedures/Studies:  2D Echo on 01/03/2020 1. Left ventricular ejection fraction, by estimation, is 65 to 70%. The  left ventricle has normal function. The left ventricle has no regional  wall motion abnormalities. There is moderate to severe left ventricular  hypertrophy. Left ventricular  diastolic parameters are indeterminate.  2. Right ventricular systolic function is normal. The right ventricular  size is normal.  3. The mitral valve is normal in structure. Trivial mitral valve  regurgitation. No evidence of mitral stenosis.  4. The aortic valve is tricuspid. There is mild calcification of the  aortic valve. Aortic valve regurgitation is not visualized. Mild aortic  valve sclerosis is present, with no evidence of aortic valve stenosis.  5. The inferior vena cava is normal in size with <50% respiratory  variability, suggesting right atrial pressure of 8 mmHg.    DG Chest 2 View  Result Date: 01/02/2020 CLINICAL DATA:  Short of breath with cough EXAM: CHEST - 2 VIEW COMPARISON:  11/22/2019 FINDINGS: No significant pleural effusion. Streaky left lung base opacity. Thickening along the right fissure as before. Low lung volumes. Stable cardiomediastinal silhouette. No pneumothorax. IMPRESSION: Low lung volumes with streaky atelectasis or or pneumonia at the left lung base. Electronically Signed   By: Donavan Foil M.D.   On: 01/02/2020 16:17   CT CORONARY MORPH W/CTA COR W/SCORE W/CA W/CM &/OR  WO/CM  Addendum Date: 01/03/2020   ADDENDUM REPORT: 01/03/2020 17:03 CLINICAL DATA:  74 year old male with medical history significant of HTN, IIDM, hyperlipidemia, severe asthma, recent COVID-19 pneumonia who presented with new onset of chest pain EXAM: Cardiac/Coronary  CTA TECHNIQUE: The patient was scanned on a [employer]. FINDINGS: A 100 kV prospective scan was triggered in the descending thoracic aorta at 111 HU's. Axial non-contrast 3 mm slices were  carried out through the heart. The data set was analyzed on a dedicated work station and scored using the [place]. Gantry rotation speed was 250 msecs and collimation was .6 mm. 50 mg of PO Metoprolol and 0.8 mg of sl NTG was given. The 3D data set was reconstructed in 5% intervals of the 67-82 % of the R-R cycle. Diastolic phases were analyzed on a dedicated work station using MPR, MIP and VRT modes. The patient received 80 cc of contrast. Aorta: Normal size. Mild diffuse atherosclerotic plaque and calcifications. No dissection. Aortic Valve:  Trileaflet.  No calcifications. Coronary Arteries:  Normal coronary origin.  Right dominance. RCA is a large dominant artery that gives rise to PDA and PLA. There is moderate, mixed, predominantly calcified plaque in the proximal RCA with a focal stenosis 50-69%. Mid and distal RCA and PDA, PLA have only luminal irregularities. Left main is a large artery that gives rise to LAD, ramus intermedius and LCX arteries. Distal left main has minimal eccentric calcified plaque with stenosis 0-25%. LAD is a large vessel that gives rise to a medium sized diagonal artery. Proximal LAD has mild calcified plaque with stenosis 25-49%. Mid LAD has a mild focal calcified plaque with stenosis 25-49%. Distal LAD has only luminal irregularities. D1 has moderate calcified plaque in the proximal segment with stenosis 50-69%. LCX is a non-dominant artery that gives rise to one medium sized OM1 branch. There is only minimal  plaque. OM1 is a medium size artery with minimal irregularities. Other findings: Normal pulmonary vein drainage into the left atrium. Normal left atrial appendage without a thrombus. IMPRESSION: 1. Coronary calcium score of 209. This was 29 percentile for age and sex matched control. 2. Normal coronary origin with right dominance. 3. CAD-RADS 3. Moderate stenosis in the proximal RCA and proximal portion of 1. diagonal artery. Consider symptom-guided anti-ischemic pharmacotherapy as well as risk factor modification per guideline directed care. Additional analysis with CT FFR will be submitted. 4. Moderately dilated pulmonary artery with maximum diameter 36 mm suspicious for pulmonary hypertension. 5.  PFO present. Electronically Signed   By: Ena Dawley   On: 01/03/2020 17:03   Result Date: 01/03/2020 EXAM: OVER-READ INTERPRETATION  CT CHEST The following report is an over-read performed by radiologist Dr. Vinnie Langton of [facility], [place] on 01/03/2020. This over-read does not include interpretation of cardiac or coronary anatomy or pathology. The coronary calcium score/coronary CTA interpretation by the cardiologist is attached. COMPARISON:  None. FINDINGS: Aortic atherosclerosis. Dilatation of the pulmonic trunk (3.6 cm in diameter). Within the visualized portions of the thorax there are no suspicious appearing pulmonary nodules or masses, there is no acute consolidative airspace disease, no pleural effusions, no pneumothorax and no lymphadenopathy. Visualized portions of the upper abdomen are unremarkable. There are no aggressive appearing lytic or blastic lesions noted in the visualized portions of the skeleton. IMPRESSION: 1. Dilatation of the pulmonic trunk (3.6 cm in diameter), concerning for pulmonary arterial hypertension. 2.  Aortic Atherosclerosis (ICD10-I70.0). Electronically Signed: By: Vinnie Langton M.D. On: 01/03/2020 15:34   ECHOCARDIOGRAM COMPLETE  Result Date: 01/03/2020     ECHOCARDIOGRAM REPORT   Patient Name:   BOBAK OGUINN Date of Exam: 01/03/2020 Medical Rec #:  [id number]    Height:       66.0 in Accession #:    [id number]   Weight:       192.7 lb Date of Birth:  Mar 19, 1945    BSA:  1.968 m Patient Age:    45 years     BP:           140/71 mmHg Patient Gender: M            HR:           79 bpm. Exam Location:  Inpatient Procedure: 2D Echo, Cardiac Doppler and Color Doppler Indications:    R07.9* Chest pain, unspecified  History:        Patient has prior history of Echocardiogram examinations, most                 recent 04/24/2019. COPD, Signs/Symptoms:Chest Pain and Dyspnea;                 Risk Factors:Sleep Apnea, Diabetes and Dyslipidemia. Covid 19                 infection 9/21. Hypoxia.  Sonographer:    Roseanna Rainbow RDCS Referring Phys: [id number] [place]  1. Left ventricular ejection fraction, by estimation, is 65 to 70%. The left ventricle has normal function. The left ventricle has no regional wall motion abnormalities. There is moderate to severe left ventricular hypertrophy. Left ventricular diastolic parameters are indeterminate.  2. Right ventricular systolic function is normal. The right ventricular size is normal.  3. The mitral valve is normal in structure. Trivial mitral valve regurgitation. No evidence of mitral stenosis.  4. The aortic valve is tricuspid. There is mild calcification of the aortic valve. Aortic valve regurgitation is not visualized. Mild aortic valve sclerosis is present, with no evidence of aortic valve stenosis.  5. The inferior vena cava is normal in size with <50% respiratory variability, suggesting right atrial pressure of 8 mmHg. Conclusion(s)/Recommendation(s): Otherwise normal echocardiogram, with minor abnormalities described in the report. LV walls measure thicker than prior study, borderline severe LVH. No significant valve disease. FINDINGS  Left Ventricle: Left ventricular ejection fraction, by estimation, is 65 to  70%. The left ventricle has normal function. The left ventricle has no regional wall motion abnormalities. The left ventricular internal cavity size was normal in size. There is  moderate to severe left ventricular hypertrophy. Left ventricular diastolic parameters are indeterminate. Right Ventricle: The right ventricular size is normal. No increase in right ventricular wall thickness. Right ventricular systolic function is normal. Left Atrium: Left atrial size was normal in size. Right Atrium: Right atrial size was normal in size. Pericardium: There is no evidence of pericardial effusion. Mitral Valve: The mitral valve is normal in structure. Trivial mitral valve regurgitation. No evidence of mitral valve stenosis. Tricuspid Valve: The tricuspid valve is normal in structure. Tricuspid valve regurgitation is trivial. No evidence of tricuspid stenosis. Aortic Valve: The aortic valve is tricuspid. There is mild calcification of the aortic valve. Aortic valve regurgitation is not visualized. Mild aortic valve sclerosis is present, with no evidence of aortic valve stenosis. Pulmonic Valve: The pulmonic valve was not well visualized. Pulmonic valve regurgitation is not visualized. No evidence of pulmonic stenosis. Aorta: The aortic root, ascending aorta and aortic arch are all structurally normal, with no evidence of dilitation or obstruction. Venous: The inferior vena cava is normal in size with less than 50% respiratory variability, suggesting right atrial pressure of 8 mmHg. IAS/Shunts: The atrial septum is grossly normal.  LEFT VENTRICLE PLAX 2D LVIDd:         3.30 cm     Diastology LVIDs:         2.10 cm  LV e' medial:    5.11 cm/s LV PW:         1.90 cm     LV E/e' medial:  13.6 LV IVS:        2.10 cm     LV e' lateral:   6.64 cm/s LVOT diam:     2.40 cm     LV E/e' lateral: 10.5 LV SV:         90 LV SV Index:   46 LVOT Area:     4.52 cm  LV Volumes (MOD) LV vol d, MOD A2C: 68.4 ml LV vol d, MOD A4C: 56.3 ml LV  vol s, MOD A2C: 19.3 ml LV vol s, MOD A4C: 18.5 ml LV SV MOD A2C:     49.1 ml LV SV MOD A4C:     56.3 ml LV SV MOD BP:      42.7 ml RIGHT VENTRICLE         IVC TAPSE (M-mode): 2.6 cm  IVC diam: 1.90 cm LEFT ATRIUM             Index       RIGHT ATRIUM          Index LA diam:        3.30 cm 1.68 cm/m  RA Area:     6.07 cm LA Vol (A2C):   29.2 ml 14.83 ml/m RA Volume:   7.77 ml  3.95 ml/m LA Vol (A4C):   26.0 ml 13.21 ml/m LA Biplane Vol: 28.9 ml 14.68 ml/m  AORTIC VALVE LVOT Vmax:   99.80 cm/s LVOT Vmean:  75.500 cm/s LVOT VTI:    0.198 m  AORTA Ao Root diam: 3.60 cm Ao Asc diam:  3.85 cm MITRAL VALVE MV Area (PHT): 2.69 cm     SHUNTS MV Decel Time: 282 msec     Systemic VTI:  0.20 m MV E velocity: 69.40 cm/s   Systemic Diam: 2.40 cm MV A velocity: 105.00 cm/s MV E/A ratio:  0.66 Buford Dresser MD Electronically signed by Buford Dresser MD Signature Date/Time: 01/03/2020/11:55:57 AM    Final        The results of significant diagnostics from this hospitalization (including imaging, microbiology, ancillary and laboratory) are listed below for reference.     Microbiology: Recent Results (from the past 240 hour(s))  MRSA PCR Screening     Status: None   Collection Time: 01/03/20  2:21 AM   Specimen: Nasal Mucosa; Nasopharyngeal  Result Value Ref Range Status   MRSA by PCR NEGATIVE NEGATIVE Final    Comment:        The GeneXpert MRSA Assay (FDA approved for NASAL specimens only), is one component of a comprehensive MRSA colonization surveillance program. It is not intended to diagnose MRSA infection nor to guide or monitor treatment for MRSA infections. Performed at [facility], [place] [address], [place], [place] [address]      Labs: BNP (last 3 results) Recent Labs    01/02/20 1822  BNP 42.7   Basic Metabolic Panel: Recent Labs  Lab 01/02/20 1552  NA 141  K 3.5  CL 102  CO2 31  GLUCOSE 101*  BUN 10  CREATININE 0.89  CALCIUM 10.2   Liver Function  Tests: No results for input(s): AST, ALT, ALKPHOS, BILITOT, PROT, ALBUMIN in the last 168 hours. No results for input(s): LIPASE, AMYLASE in the last 168 hours. No results for input(s): AMMONIA in the last 168 hours. CBC: Recent Labs  Lab 01/02/20 1552  WBC 5.4  HGB 12.6*  HCT 39.7  MCV 93.6  PLT 254   Cardiac Enzymes: No results for input(s): CKTOTAL, CKMB, CKMBINDEX, TROPONINI in the last 168 hours. BNP: Invalid input(s): POCBNP CBG: Recent Labs  Lab 01/02/20 2221 01/03/20 0715 01/03/20 1119 01/03/20 1633  GLUCAP 111* 124* 106* 104*   D-Dimer No results for input(s): DDIMER in the last 72 hours. Hgb A1c No results for input(s): HGBA1C in the last 72 hours. Lipid Profile No results for input(s): CHOL, HDL, LDLCALC, TRIG, CHOLHDL, LDLDIRECT in the last 72 hours. Thyroid function studies No results for input(s): TSH, T4TOTAL, T3FREE, THYROIDAB in the last 72 hours.  Invalid input(s): FREET3 Anemia work up No results for input(s): VITAMINB12, FOLATE, FERRITIN, TIBC, IRON, RETICCTPCT in the last 72 hours. Urinalysis No results found for: COLORURINE, APPEARANCEUR, LABSPEC, PHURINE, GLUCOSEU, HGBUR, BILIRUBINUR, KETONESUR, PROTEINUR, UROBILINOGEN, NITRITE, LEUKOCYTESUR Sepsis Labs Invalid input(s): PROCALCITONIN,  WBC,  LACTICIDVEN   Time coordinating discharge: 35 minutes  SIGNED:  Mercy Riding, MD  [facility] 01/03/2020, 5:47 PM  If 7PM-7AM, please contact night-coverage [email or web address]

## 2020-01-03 NOTE — Plan of Care (Signed)

## 2020-01-04 ENCOUNTER — Other Ambulatory Visit (HOSPITAL_COMMUNITY): Payer: Self-pay | Admitting: Emergency Medicine

## 2020-01-04 ENCOUNTER — Ambulatory Visit (HOSPITAL_COMMUNITY)
Admission: RE | Admit: 2020-01-04 | Discharge: 2020-01-04 | Disposition: A | Discharge status: Home / Self Care | Payer: Medicare Other | Source: Ambulatory Visit | Attending: Cardiology | Admitting: Cardiology

## 2020-01-04 DIAGNOSIS — R079 Chest pain, unspecified: Secondary | ICD-10-CM

## 2020-01-04 DIAGNOSIS — I2510 Atherosclerotic heart disease of native coronary artery without angina pectoris: Secondary | ICD-10-CM | POA: Insufficient documentation

## 2020-01-05 DIAGNOSIS — I2510 Atherosclerotic heart disease of native coronary artery without angina pectoris: Secondary | ICD-10-CM | POA: Diagnosis not present

## 2020-01-08 DIAGNOSIS — R059 Cough, unspecified: Secondary | ICD-10-CM | POA: Diagnosis not present

## 2020-01-08 DIAGNOSIS — I5032 Chronic diastolic (congestive) heart failure: Secondary | ICD-10-CM | POA: Diagnosis not present

## 2020-01-08 DIAGNOSIS — R0789 Other chest pain: Secondary | ICD-10-CM | POA: Diagnosis not present

## 2020-01-12 DIAGNOSIS — J4550 Severe persistent asthma, uncomplicated: Secondary | ICD-10-CM | POA: Diagnosis not present

## 2020-01-15 ENCOUNTER — Other Ambulatory Visit: Payer: Self-pay

## 2020-01-15 ENCOUNTER — Ambulatory Visit (INDEPENDENT_AMBULATORY_CARE_PROVIDER_SITE_OTHER): Payer: Medicare Other

## 2020-01-15 DIAGNOSIS — J4550 Severe persistent asthma, uncomplicated: Secondary | ICD-10-CM | POA: Diagnosis not present

## 2020-02-05 DIAGNOSIS — R1310 Dysphagia, unspecified: Secondary | ICD-10-CM | POA: Diagnosis not present

## 2020-02-05 DIAGNOSIS — K219 Gastro-esophageal reflux disease without esophagitis: Secondary | ICD-10-CM | POA: Diagnosis not present

## 2020-02-09 DIAGNOSIS — J4550 Severe persistent asthma, uncomplicated: Secondary | ICD-10-CM | POA: Diagnosis not present

## 2020-02-12 ENCOUNTER — Other Ambulatory Visit: Payer: Self-pay

## 2020-02-12 ENCOUNTER — Ambulatory Visit (INDEPENDENT_AMBULATORY_CARE_PROVIDER_SITE_OTHER): Payer: Medicare Other | Admitting: *Deleted

## 2020-02-12 DIAGNOSIS — J4550 Severe persistent asthma, uncomplicated: Secondary | ICD-10-CM

## 2020-02-15 ENCOUNTER — Encounter: Payer: Self-pay | Admitting: Internal Medicine

## 2020-02-15 ENCOUNTER — Other Ambulatory Visit: Payer: Self-pay

## 2020-02-15 ENCOUNTER — Ambulatory Visit (INDEPENDENT_AMBULATORY_CARE_PROVIDER_SITE_OTHER): Payer: Medicare Other | Admitting: Internal Medicine

## 2020-02-15 VITALS — BP 148/84 | HR 74 | Ht 66.0 in | Wt 195.4 lb

## 2020-02-15 DIAGNOSIS — J449 Chronic obstructive pulmonary disease, unspecified: Secondary | ICD-10-CM

## 2020-02-15 DIAGNOSIS — I5033 Acute on chronic diastolic (congestive) heart failure: Secondary | ICD-10-CM | POA: Diagnosis not present

## 2020-02-15 DIAGNOSIS — I10 Essential (primary) hypertension: Secondary | ICD-10-CM

## 2020-02-15 DIAGNOSIS — R079 Chest pain, unspecified: Secondary | ICD-10-CM | POA: Diagnosis not present

## 2020-02-15 DIAGNOSIS — R0602 Shortness of breath: Secondary | ICD-10-CM | POA: Diagnosis not present

## 2020-02-15 NOTE — Patient Instructions (Signed)

## 2020-02-15 NOTE — Progress Notes (Signed)
OFFICE NOTE  Chief Complaint:  Hospital follow-up  Primary Care Physician: Douglas Gravel, MD  HPI:  Douglas Hansen is a pleasant 74 year old male who is currently referred to me by Dr. Maudie Hansen. His past [name] history is extensive and includes the following: Hypertension, dyslipidemia, type 2 diabetes, COPD, obstructive sleep apnea on CPAP, GERD, mild PAD, and significant allergic symptoms. He was previously followed by Dr. Einar Hansen and has had 2 cardiac catheterizations in his past. His last heart catheterization was in 2004 which showed no significant coronary disease. He had a stress test in February 2014 which was negative for ischemia and EF of 68%. He also had ABIs for leg pain which were 0.9 on the right and 0.94 on the left. He was started on Pletal but had some shortness of breath and abnormal symptoms on it and this was discontinued. His last echocardiogram was in March 2014 showed an EF of 52% with mild to moderate right atrial enlargement, dilated IVC and elevated right atrial pressure. Recently had an upper respiratory infection concerning for COPD exacerbation. He was started on Avelox which she completed. At the end of treatment he felt some improvement in his breathing but then started to have shortness of breath again and cough. He was switched to azithromycin and placed on a prednisone Dosepak. He was also started on low-dose Lasix for chest x-ray findings of some fluid in the right fissure. Mr. Douglas Hansen says that he gained a few pounds in weight with this shortness of breath and noted that he's been on Lasix now for 10 days and that he's lost some weight and feels less fullness in his belly and in his chest. He denied any ankle edema. His shortness of breath is improving. He also denies any chest pain or anginal symptoms. He had a mild amount of orthopnea which is also resolving.   Mr. Plaskett returns today for follow-up of his echocardiogram. This showed normal LV function with mild pulmonary  hypertension and elevated RVSP of 35 mmHg. This is likely secondary to COPD, but does not explain his shortness of breath. He is currently on Lasix 20 mg daily has not noted a significant improvement in his shortness of breath.  06/02/2016  Mr. Douglas Hansen was seen today in follow-up. Overall he seems to be doing well. Blood pressure initially was elevated 162/84. Weight is down from 196-185 pounds. He denies any chest pain. He is short of breath with exertion however it is difficult to tell whether this is related to COPD or not. He has reported some pain in both legs when walking. He says it seems to come on after walking certain distances and gets better at rest, reminiscent of possible claudication.  09/14/2016  Mr. Douglas Hansen returns today for follow-up. He still has concerns about bilateral leg pain with walking. He underwent ABIs and lower extremity arterial Dopplers which were normal. He also underwent a nuclear stress test because worsening shortness of breath and was found to have no evidence of ischemia with normal LV function. He's required increasing use of his inhalers recently and I suspect his symptoms may be related to COPD.  12/09/2017  Mr. Douglas Hansen is seen today for routine follow-up.  Overall he seems to be doing well.  He says his shortness of breath is been well controlled on inhalers.  Blood pressure is at goal today 128/84.  EKG shows sinus rhythm at 71 with stable inferior and anterior Q waves.  He denies any new chest pain or new  significant cardiac symptoms.  His only other major concern today is over the past 1 to 2 months he has had significant, drenching night sweats.  There is been no associated weight loss or change in appetite, but he is somewhat disturbed by this.  Labs from January 2019 showed total cholesterol 107, HDL 44, LDL 42 and triglycerides 107.  Hemoglobin A1c was 6.2.  04/07/2019  Mr. Douglas Hansen returns today for follow-up.  He was referred back by his PCP for shortness of  breath.  He reports possibly a slightly worsening shortness of breath when walking.  He does have COPD on inhalers.  An echo was performed 3 years ago which showed some mild pulmonary hypertension with RVSP 35 mmHg.  He had stress testing in 2018 which showed no ischemia.  He had a heart cath in 2004 which showed no coronary disease.  06/07/2019  Mr. Douglas Hansen returns today for follow-up of his echo.  He reports some persistent shortness of breath.  EKG today is normal.  In February 2021 showed 60 to 65% with moderate LVH and grade 1 diastolic dysfunction.  LV filling pressures were elevated.  There was mildly elevated pulmonary artery pressure.  Findings suggestive of some volume overload.  I discussed the findings with him today.  He noted that there was 1 day where he normally takes 20 mg of furosemide, however he cannot remember if he took the medicine and therefore took an additional medicine.  Later he realized he had taken it however after taking 2 doses he noted a significant improvement in his breathing.  02/15/2020  Mr. Douglas Hansen returns today for follow-up.  He was recently hospitalized with unspecified chest pain and felt to have acute diastolic congestive heart failure.  He ruled out for MI and underwent CT coronary angiography which demonstrated 4 of 209, 44th percentile for age and sex matched control.  There was moderate stenosis in the proximal RCA and proximal portion of the first diagonal artery.  CT FFR was negative.  A PFO was noted.  The pulmonary artery was dilated suggestive of pulmonary hypertension.  He responded to diuresis.  Since discharge he has done well and is breathing better.  He did have follow-up with Dr. Julianne Hansen office.  Blood pressure slightly elevated today but at home he says is around 284-132 systolic.  He is on low-dose furosemide.  PMHx:  Past Medical History:  Diagnosis Date  . Agent orange exposure   . Arthritis   . Asthma   . COPD (chronic obstructive pulmonary  disease) ([place])   . Diabetes mellitus   . Hyperlipemia   . Hypertension   . OSA on CPAP   . Pneumonia   . PTSD (post-traumatic stress disorder)     Past Surgical History:  Procedure Laterality Date  . CARDIAC CATHETERIZATION    . right thumb surgery    . TONSILLECTOMY      FAMHx:  Family History  Problem Relation Age of Onset  . Emphysema Father   . Asthma Father   . Heart disease Father   . Heart attack Father   . Stroke Mother   . Heart attack Brother   . Diabetes Brother   . Heart attack Brother   . Diabetes Brother   . Colon cancer Neg Hx     SOCHx:   reports that he quit smoking about 34 years ago. His smoking use included cigarettes. He has a 20.00 pack-year smoking history. He has never used smokeless tobacco. He reports that  he does not drink alcohol and does not use drugs.  ALLERGIES:  Allergies  Allergen Reactions  . Codeine Nausea Only and Other (See Comments)    GI upset  . Lipitor [Atorvastatin] Other (See Comments)    "Made my stomach burn"  . Pravachol [Pravastatin Sodium] Other (See Comments)    Caused joint pain    ROS: Pertinent items noted in HPI and remainder of comprehensive ROS otherwise negative.  HOME MEDS: Current Outpatient Medications  Medication Sig Dispense Refill  . albuterol (VENTOLIN HFA) 108 (90 Base) MCG/ACT inhaler INHALE 2 PUFFS BY MOUTH EVERY 4 HOURS AS NEEDED FOR WHEEZING OR SHORTNESS OF BREATH 42.5 g 0  . amLODipine (NORVASC) 10 MG tablet Take 10 mg by mouth daily.    Marland Kitchen ascorbic acid (VITAMIN C) 500 MG tablet Take 1,000 mg by mouth daily.     Marland Kitchen aspirin EC 325 MG tablet Take 325 mg by mouth daily.     . benzonatate (TESSALON PERLES) 100 MG capsule Take 1 capsule (100 mg total) by mouth every 6 (six) hours as needed for cough. 30 capsule 0  . budesonide-formoterol (SYMBICORT) 80-4.5 MCG/ACT inhaler Inhale 2 puffs into the lungs 2 (two) times daily.     . Carbinoxamine Maleate 4 MG TABS Take 1 tablet (4 mg total) by mouth 2  (two) times daily as needed (allergies). 60 tablet 1  . carvedilol (COREG) 12.5 MG tablet Take 6.25 mg by mouth 2 (two) times daily with a meal.     . Cellulose Carmellose Sodium POWD Apply 1 application topically daily as needed (irritation). Apply to feet    . Cholecalciferol (VITAMIN D-3 PO) Take 1 capsule by mouth daily.    . ciclesonide (ALVESCO) 80 MCG/ACT inhaler Inhale 2 puffs into the lungs 2 (two) times daily.     . clobetasol ointment (TEMOVATE) 8.85 % Apply 1 application topically 2 (two) times daily as needed (to affected area).     . Diclofenac Sodium 1.5 % SOLN Apply 4 drops topically 4 (four) times daily as needed (as directed- to affected area).   0  . EPINEPHrine 0.3 mg/0.3 mL IJ SOAJ injection Inject 0.3 mLs (0.3 mg total) into the muscle as needed for anaphylaxis. 2 each 1  . EYLEA 2 MG/0.05ML SOLN Place into the left eye every 30 (thirty) days. INJECTION    . Fluocinolone Acetonide Body 0.01 % OIL Apply 1 application topically See admin instructions. Apply a small amount to affected area daily    . fluticasone (FLONASE) 50 MCG/ACT nasal spray Place 1 spray into both nostrils daily. 16 g 0  . furosemide (LASIX) 20 MG tablet Take 20 mg by mouth daily.    Marland Kitchen ketoconazole (NIZORAL) 2 % shampoo Apply 1 application topically 3 (three) times a week.    . mepolizumab (NUCALA) 100 MG/ML SOSY Inject 100 mg into the skin every 28 (twenty-eight) days.    . metFORMIN (GLUCOPHAGE) 500 MG tablet Take 250 mg by mouth 2 (two) times daily as needed (for elevated BGL).    Marland Kitchen montelukast (SINGULAIR) 10 MG tablet Take 10 mg by mouth at bedtime.     . Multiple Vitamins-Minerals (MULTIVITAMIN & MINERAL PO) Take 1 tablet by mouth daily. Reported on 06/17/2015    . omeprazole (PRILOSEC) 20 MG capsule Take 20 mg by mouth daily before breakfast.    . OneTouch Delica Lancets 02D MISC daily.    Glory Rosebush ULTRA test strip as needed.     Marland Kitchen Respiratory Therapy Supplies (  FLUTTER) DEVI Use as directed 1 each 0   . rosuvastatin (CRESTOR) 20 MG tablet Take 10 mg by mouth at bedtime.     . sertraline (ZOLOFT) 100 MG tablet Take 100 mg by mouth daily.    . simethicone (MYLICON) 80 MG chewable tablet Chew 80 mg by mouth daily.    . Tiotropium Bromide Monohydrate (SPIRIVA RESPIMAT) 2.5 MCG/ACT AERS Inhale 2 puffs one daily 4 g 5   Current Facility-Administered Medications  Medication Dose Route Frequency Provider Last Rate Last Admin  . Mepolizumab SOLR 100 mg  100 mg Subcutaneous Q28 days Kennith Gain, MD   100 mg at 02/12/20 1349    LABS/IMAGING: No results found for this or any previous visit (from the past 73 hour(s)). No results found.  WEIGHTS: Wt Readings from Last 3 Encounters:  02/15/20 195 lb 6.4 oz (88.6 kg)  01/02/20 192 lb 10.9 oz (87.4 kg)  11/24/19 190 lb 14.7 oz (86.6 kg)    VITALS: BP (!) 148/84   Pulse 74   Ht 5\' 6"  (1.676 m)   Wt 195 lb 6.4 oz (88.6 kg)   BMI 31.54 kg/m   EXAM: General appearance: alert and no distress Neck: no carotid bruit, no JVD and thyroid not enlarged, symmetric, no tenderness/mass/nodules Lungs: clear to auscultation bilaterally Heart: regular rate and rhythm Abdomen: soft, non-tender; bowel sounds normal; no masses,  no organomegaly Extremities: extremities normal, atraumatic, no cyanosis or edema Pulses: 2+ and symmetric Skin: Skin color, texture, turgor normal. No rashes or lesions Neurologic: Grossly normal Psych: Pleasant  EKG: Normal sinus rhythm 61-personally reviewed  ASSESSMENT: 1. Acute on chronic diastolic congestive heart failure -LVEF 65 to 70% 2. Mild to moderate nonobstructive coronary disease, CAD RADS 3, CAC score 209 (44th percentile)-12/2019 3. Dyspnea on exertion - normal LVEF 65-70%, DD, mild pulm HTN, low risk Myoview-negative for ischemia (06/2016) 4. Leg pain concerning for claudication- history of mildly reduced ABIs 5. COPD with acute exacerbation 6. Hypertension 7. Dyslipidemia 8. OSA on  CPAP 9. Type 2 diabetes  PLAN: 1.   Mr. Marcoux was just hospitalized with chest pain and ultimately treated for acute on chronic diastolic congestive heart failure.  Since discharge he says he is breathing better.  He did actually not recall having had the CT during his hospitalization.  I went over the results in detail with him.  He is on appropriate medical therapy.  He is on a low-dose diuretic for maintenance and understands he could take additional diuretic for 2 to 3 pound weight gain over 2 days.  He was stable at follow-up with his PCP recently.  No medication changes were made today.  Plan follow-up in 6 months or sooner as necessary.  Pixie Casino, MD, [facility], [place] Director of the [facility] Lipid Disorders &  Cardiovascular [facility] Reduction Clinic Diplomate of the American Board of Clinical Lipidology Attending Cardiologist  Direct Dial: [phone]  Fax: [phone]  Website:  [email or web address] 02/15/2020, 2:13 PM

## 2020-02-19 ENCOUNTER — Other Ambulatory Visit: Payer: Self-pay | Admitting: Allergy

## 2020-02-28 ENCOUNTER — Other Ambulatory Visit: Payer: Self-pay | Admitting: Allergy

## 2020-03-11 ENCOUNTER — Ambulatory Visit: Payer: TRICARE For Life (TFL)

## 2020-03-13 DIAGNOSIS — J4550 Severe persistent asthma, uncomplicated: Secondary | ICD-10-CM | POA: Diagnosis not present

## 2020-03-14 ENCOUNTER — Ambulatory Visit (INDEPENDENT_AMBULATORY_CARE_PROVIDER_SITE_OTHER): Payer: Medicare Other

## 2020-03-14 ENCOUNTER — Other Ambulatory Visit: Payer: Self-pay

## 2020-03-14 DIAGNOSIS — J4550 Severe persistent asthma, uncomplicated: Secondary | ICD-10-CM | POA: Diagnosis not present

## 2020-04-01 DIAGNOSIS — E785 Hyperlipidemia, unspecified: Secondary | ICD-10-CM | POA: Diagnosis not present

## 2020-04-01 DIAGNOSIS — Z125 Encounter for screening for malignant neoplasm of prostate: Secondary | ICD-10-CM | POA: Diagnosis not present

## 2020-04-01 DIAGNOSIS — I10 Essential (primary) hypertension: Secondary | ICD-10-CM | POA: Diagnosis not present

## 2020-04-01 DIAGNOSIS — R7989 Other specified abnormal findings of blood chemistry: Secondary | ICD-10-CM | POA: Diagnosis not present

## 2020-04-01 DIAGNOSIS — E119 Type 2 diabetes mellitus without complications: Secondary | ICD-10-CM | POA: Diagnosis not present

## 2020-04-08 DIAGNOSIS — I10 Essential (primary) hypertension: Secondary | ICD-10-CM | POA: Diagnosis not present

## 2020-04-08 DIAGNOSIS — W19XXXA Unspecified fall, initial encounter: Secondary | ICD-10-CM | POA: Diagnosis not present

## 2020-04-08 DIAGNOSIS — Z23 Encounter for immunization: Secondary | ICD-10-CM | POA: Diagnosis not present

## 2020-04-08 DIAGNOSIS — Z0000 Encounter for general adult medical examination without abnormal findings: Secondary | ICD-10-CM | POA: Diagnosis not present

## 2020-04-08 DIAGNOSIS — E785 Hyperlipidemia, unspecified: Secondary | ICD-10-CM | POA: Diagnosis not present

## 2020-04-08 DIAGNOSIS — J449 Chronic obstructive pulmonary disease, unspecified: Secondary | ICD-10-CM | POA: Diagnosis not present

## 2020-04-08 DIAGNOSIS — M25531 Pain in right wrist: Secondary | ICD-10-CM | POA: Diagnosis not present

## 2020-04-08 DIAGNOSIS — N400 Enlarged prostate without lower urinary tract symptoms: Secondary | ICD-10-CM | POA: Diagnosis not present

## 2020-04-08 DIAGNOSIS — E1151 Type 2 diabetes mellitus with diabetic peripheral angiopathy without gangrene: Secondary | ICD-10-CM | POA: Diagnosis not present

## 2020-04-08 DIAGNOSIS — I739 Peripheral vascular disease, unspecified: Secondary | ICD-10-CM | POA: Diagnosis not present

## 2020-04-08 DIAGNOSIS — M8580 Other specified disorders of bone density and structure, unspecified site: Secondary | ICD-10-CM | POA: Diagnosis not present

## 2020-04-08 DIAGNOSIS — E210 Primary hyperparathyroidism: Secondary | ICD-10-CM | POA: Diagnosis not present

## 2020-04-08 DIAGNOSIS — R600 Localized edema: Secondary | ICD-10-CM | POA: Diagnosis not present

## 2020-04-10 DIAGNOSIS — J4550 Severe persistent asthma, uncomplicated: Secondary | ICD-10-CM | POA: Diagnosis not present

## 2020-04-11 ENCOUNTER — Other Ambulatory Visit: Payer: Self-pay

## 2020-04-11 ENCOUNTER — Ambulatory Visit (INDEPENDENT_AMBULATORY_CARE_PROVIDER_SITE_OTHER): Payer: Medicare Other

## 2020-04-11 DIAGNOSIS — J4550 Severe persistent asthma, uncomplicated: Secondary | ICD-10-CM | POA: Diagnosis not present

## 2020-05-07 ENCOUNTER — Telehealth: Payer: Self-pay | Admitting: *Deleted

## 2020-05-07 NOTE — Telephone Encounter (Signed)
Left voicemail on patient's phone to give the office a call back to schedule an appointment.

## 2020-05-07 NOTE — Telephone Encounter (Signed)
Patient has been on Nucala for his asthma and gets Rx thru [employer] but we are currently losing money on Nucala and can no longer do Nucala.  I advised patient of this and gave him option of trying [name] or we can try to get his Nucala medication thru his [employer] coverage.  Patient advised ok to switch to [facility] and advised new dosing schedule and will order for his next injection

## 2020-05-07 NOTE — Telephone Encounter (Signed)
Patient scheduled for an office visit on 05/20/2020 with Dr. Maudie Mercury.

## 2020-05-07 NOTE — Telephone Encounter (Signed)
Okay noted

## 2020-05-08 DIAGNOSIS — J4550 Severe persistent asthma, uncomplicated: Secondary | ICD-10-CM | POA: Diagnosis not present

## 2020-05-09 ENCOUNTER — Other Ambulatory Visit: Payer: Self-pay

## 2020-05-09 ENCOUNTER — Ambulatory Visit (INDEPENDENT_AMBULATORY_CARE_PROVIDER_SITE_OTHER): Payer: Medicare Other

## 2020-05-09 DIAGNOSIS — J4550 Severe persistent asthma, uncomplicated: Secondary | ICD-10-CM

## 2020-05-09 MED ORDER — BENRALIZUMAB 30 MG/ML ~~LOC~~ SOSY
30.0000 mg | PREFILLED_SYRINGE | Freq: Once | SUBCUTANEOUS | Status: AC
  Administered 2020-05-09: 30 mg via SUBCUTANEOUS

## 2020-05-09 NOTE — Progress Notes (Signed)
Immunotherapy   Patient Details  Name: Douglas Hansen MRN: [id number] Date of Birth: [birth year]  05/09/2020  [name] started injections for  [name] given in the office today. Patient waited 30 mins and tolerated injection well.  Frequency:4 weeks for 3 months then every 8 weeks  Epi-Pen:Epi-Pen Available  Consent signed and patient instructions given.   Festus Holts Nalea Salce 05/09/2020, 2:42 PM

## 2020-05-20 ENCOUNTER — Other Ambulatory Visit: Payer: Self-pay

## 2020-05-20 ENCOUNTER — Ambulatory Visit (INDEPENDENT_AMBULATORY_CARE_PROVIDER_SITE_OTHER): Payer: Medicare Other | Admitting: Allergy

## 2020-05-20 ENCOUNTER — Encounter: Payer: Self-pay | Admitting: Allergy

## 2020-05-20 VITALS — BP 110/60 | HR 73 | Temp 98.3°F | Resp 17 | Ht 66.0 in | Wt 191.2 lb

## 2020-05-20 DIAGNOSIS — J3089 Other allergic rhinitis: Secondary | ICD-10-CM | POA: Diagnosis not present

## 2020-05-20 DIAGNOSIS — J302 Other seasonal allergic rhinitis: Secondary | ICD-10-CM

## 2020-05-20 DIAGNOSIS — J4550 Severe persistent asthma, uncomplicated: Secondary | ICD-10-CM

## 2020-05-20 DIAGNOSIS — K219 Gastro-esophageal reflux disease without esophagitis: Secondary | ICD-10-CM

## 2020-05-20 DIAGNOSIS — J449 Chronic obstructive pulmonary disease, unspecified: Secondary | ICD-10-CM | POA: Diagnosis not present

## 2020-05-20 MED ORDER — BREZTRI AEROSPHERE 160-9-4.8 MCG/ACT IN AERO: 2.0000 | INHALATION_SPRAY | Freq: Two times a day (BID) | RESPIRATORY_TRACT | 5 refills | Status: DC

## 2020-05-20 NOTE — Assessment & Plan Note (Signed)
Stable.  Continue appropriate reflux list on modifications and omeprazole as prescribed.

## 2020-05-20 NOTE — Progress Notes (Signed)
Follow Up Note  RE: Douglas Hansen MRN: [id number] DOB: 07-29-1945 Date of Office Visit: 05/20/2020  Referring provider: [name], MD Primary care provider: [name], MD  Chief Complaint: asthma-copd  History of Present Illness: I had the pleasure of seeing Douglas Hansen for a follow up visit at the [facility] [place] [facility] [place] on 05/20/2020. He is a 75 y.o. male, who is being followed for asthma-COPD overlap syndrome on Fasenra, allergic rhinitis and GERD. His previous allergy office visit was on 12/18/2019 with Dr. Maudie Hansen. Today is a regular follow up visit.  Asthma-COPD overlap syndrome Patient was switched from [place] to [place] on 3/3/022 due to insurance coverage. Sometimes has shortness of breath still at times mainly with exertion.   Currently on Symbicort 134mcg 2 puffs twice a day, Spiriva 2.27mcg 2 puffs once a day, Alvesco 71mcg 2 puffs twice a day.  Wants to consolidate some of these inhalers.   Using albuterol about every 3 weeks with good benefit.   Patient has a stairlift installed which helps.  Patient was hospitalized in October for chest pain.  Seasonal and perennial allergic rhinitis Taking zyrtec, Singulair daily, fluticasone bid, azelastine bid with good benefit. No nosebleeds.   Gastroesophageal reflux disease without esophagitis Stable with omeprazole.   Assessment and Plan: Douglas Hansen is a 75 y.o. male with: Asthma-COPD overlap syndrome ([place]) Past history - Usually has issues with his breathing twice a year - in the spring and fall. Follows with cardiology and apparently also with pulmonology. Ex-smoker. Interim history - Nucala not covered so switched to [place] in [place] 2022. Doing well with below regimen.  Today's spirometry showed restriction - improved from previous one.  Daily controller medication(s):   START Breztri 2 puffs twice a day with spacer and rinse mouth afterwards.  This replaces Symbicort and Spiriva - stop these 2 inhalers for  now.  If Douglas Hansen is not covered then let us know.  Continue Alvesco 42mcg 2 puffs twice a day with spacer and rinse mouth afterwards. Continue with Singulair 10mg  daily at night.  Continue Fasenra injections.  May use albuterol rescue inhaler 2 puffs every 4 to 6 hours as needed for shortness of breath, chest tightness, coughing, and wheezing. May use albuterol rescue inhaler 2 puffs 5 to 15 minutes prior to strenuous physical activities. Monitor frequency of use. Repeat spirometry at next visit.   Seasonal and perennial allergic rhinitis Past history - Some mild rhino conjunctivitis symptoms. 2021 bloodwork was positive to dust mites, grass pollen, tree pollen and ragweed pollen. Interim history - stable.  Continue appropriate allergen avoidance measures  Continue montelukast 10 mg daily at bedtime.  May use over the counter antihistamines such as Zyrtec (cetirizine), Claritin (loratadine), Allegra (fexofenadine), or Xyzal (levocetirizine) daily as needed.  May use azelastine nasal spray 1-2 sprays per nostril twice a day as needed for drainage  May use fluticasone nasal spray, 1 spray per nostril twice a day.   Nasal saline lavage (NeilMed) has been recommended as needed and prior to medicated nasal sprays along with instructions for proper administration.   Gastroesophageal reflux disease without esophagitis Stable.  Continue appropriate reflux list on modifications and omeprazole as prescribed.  Return in about 3 months (around 08/20/2020).  Meds ordered this encounter  Medications  . Budeson-Glycopyrrol-Formoterol (BREZTRI AEROSPHERE) 160-9-4.8 MCG/ACT AERO    Sig: Inhale 2 puffs into the lungs in the morning and at bedtime. with spacer and rinse mouth afterwards.    Dispense:  10.7 g  Refill:  5   Lab Orders  No laboratory test(s) ordered today    Diagnostics: Spirometry:  Tracings reviewed. His effort: Good reproducible efforts. FVC: 1.73L FEV1: 1.24L, 54%  predicted FEV1/FVC ratio: 72% Interpretation: Spirometry consistent with possible restrictive disease - improved from previous one.  Please see scanned spirometry results for details.  Medication List:  Current Outpatient Medications  Medication Sig Dispense Refill  . albuterol (VENTOLIN HFA) 108 (90 Base) MCG/ACT inhaler INHALE 2 PUFFS BY MOUTH EVERY 4 HOURS AS NEEDED FOR WHEEZING OR SHORTNESS OF BREATH 42.5 g 0  . amLODipine (NORVASC) 10 MG tablet Take 10 mg by mouth daily.    Marland Kitchen ascorbic acid (VITAMIN C) 500 MG tablet Take 1,000 mg by mouth daily.     Marland Kitchen aspirin EC 325 MG tablet Take 325 mg by mouth daily.     . Budeson-Glycopyrrol-Formoterol (BREZTRI AEROSPHERE) 160-9-4.8 MCG/ACT AERO Inhale 2 puffs into the lungs in the morning and at bedtime. with spacer and rinse mouth afterwards. 10.7 g 5  . carvedilol (COREG) 12.5 MG tablet Take 6.25 mg by mouth 2 (two) times daily with a meal.     . Cellulose Carmellose Sodium POWD Apply 1 application topically daily as needed (irritation). Apply to feet    . Cholecalciferol (VITAMIN D-3 PO) Take 1 capsule by mouth daily.    . ciclesonide (ALVESCO) 80 MCG/ACT inhaler Inhale 2 puffs into the lungs 2 (two) times daily.     . clobetasol ointment (TEMOVATE) 1.61 % Apply 1 application topically 2 (two) times daily as needed (to affected area).     . Diclofenac Sodium 1.5 % SOLN Apply 4 drops topically 4 (four) times daily as needed (as directed- to affected area).   0  . EPINEPHrine 0.3 mg/0.3 mL IJ SOAJ injection Inject 0.3 mLs (0.3 mg total) into the muscle as needed for anaphylaxis. 2 each 1  . EYLEA 2 MG/0.05ML SOLN Place into the left eye every 30 (thirty) days. INJECTION    . Fluocinolone Acetonide Body 0.01 % OIL Apply 1 application topically See admin instructions. Apply a small amount to affected area daily    . fluticasone (FLONASE) 50 MCG/ACT nasal spray Place 1 spray into both nostrils daily. 16 g 0  . furosemide (LASIX) 20 MG tablet Take 20 mg  by mouth daily.    Marland Kitchen ketoconazole (NIZORAL) 2 % shampoo Apply 1 application topically 3 (three) times a week.    . metFORMIN (GLUCOPHAGE) 500 MG tablet Take 250 mg by mouth 2 (two) times daily as needed (for elevated BGL).    Marland Kitchen montelukast (SINGULAIR) 10 MG tablet Take 10 mg by mouth at bedtime.     . Multiple Vitamins-Minerals (MULTIVITAMIN & MINERAL PO) Take 1 tablet by mouth daily. Reported on 06/17/2015    . omeprazole (PRILOSEC) 20 MG capsule Take 20 mg by mouth daily before breakfast.    . OneTouch Delica Lancets 09U MISC daily.    Glory Rosebush ULTRA test strip as needed.     Marland Kitchen Respiratory Therapy Supplies (FLUTTER) DEVI Use as directed 1 each 0  . rosuvastatin (CRESTOR) 20 MG tablet Take 10 mg by mouth at bedtime.     . sertraline (ZOLOFT) 100 MG tablet Take 100 mg by mouth daily.    . simethicone (MYLICON) 80 MG chewable tablet Chew 80 mg by mouth daily.     No current facility-administered medications for this visit.   Allergies: Allergies  Allergen Reactions  . Codeine Nausea Only and Other (See  Comments)    GI upset  . Lipitor [Atorvastatin] Other (See Comments)    "Made my stomach burn"  . Pravachol [Pravastatin Sodium] Other (See Comments)    Caused joint pain   I reviewed his past medical history, social history, family history, and environmental history and no significant changes have been reported from his previous visit.  Review of Systems  Constitutional: Negative for appetite change, chills, fever and unexpected weight change.  HENT: Negative for congestion and rhinorrhea.   Eyes: Negative for itching.  Respiratory: Positive for shortness of breath. Negative for cough, chest tightness and wheezing.   Gastrointestinal: Negative for abdominal pain.  Skin: Negative for rash.  Allergic/Immunologic: Positive for environmental allergies.  Neurological: Negative for headaches.   Objective: BP 110/60 (BP Location: Left Arm, Patient Position: Sitting, Cuff Size: Normal)    Pulse 73   Temp 98.3 F (36.8 C) (Temporal)   Resp 17   Ht 5\' 6"  (1.676 m)   Wt 191 lb 3.2 oz (86.7 kg)   SpO2 95%   BMI 30.86 kg/m  Body mass index is 30.86 kg/m. Physical Exam Vitals and nursing note reviewed.  Constitutional:      Appearance: Normal appearance. He is well-developed.  HENT:     Head: Normocephalic and atraumatic.     Right Ear: Tympanic membrane and external ear normal.     Left Ear: Tympanic membrane and external ear normal.     Nose: Nose normal.     Mouth/Throat:     Mouth: Mucous membranes are moist.     Pharynx: Oropharynx is clear.  Eyes:     Conjunctiva/sclera: Conjunctivae normal.  Cardiovascular:     Rate and Rhythm: Normal rate and regular rhythm.     Heart sounds: Normal heart sounds. No murmur heard.   Pulmonary:     Effort: Pulmonary effort is normal.     Breath sounds: No wheezing, rhonchi or rales.     Comments: Decreased breath sounds throughout. Musculoskeletal:     Cervical back: Neck supple.  Skin:    General: Skin is warm.     Findings: No rash.  Neurological:     Mental Status: He is alert and oriented to person, place, and time.  Psychiatric:        Behavior: Behavior normal.    Previous notes and tests were reviewed. The plan was reviewed with the patient/family, and all questions/concerned were addressed.  It was my pleasure to see Douglas Hansen today and participate in his care. Please feel free to contact me with any questions or concerns.  Sincerely,  Rexene Alberts, DO Allergy & Immunology  [facility] office: [place] office: [phone]

## 2020-05-20 NOTE — Patient Instructions (Addendum)
COPD/asthma Daily controller medication(s):   START Breztri 2 puffs twice a day with spacer and rinse mouth afterwards.  This replaces Symbicort and Spiriva - stop these 2 inhalers for now.  If Judithann Sauger is not covered then let us know.   Continue Alvesco 41mcg 2 puffs twice a day with spacer and rinse mouth afterwards.  Continue with Singulair 10mg  daily at night.  Continue Fasenra injections.  May use albuterol rescue inhaler 2 puffs every 4 to 6 hours as needed for shortness of breath, chest tightness, coughing, and wheezing. May use albuterol rescue inhaler 2 puffs 5 to 15 minutes prior to strenuous physical activities. Monitor frequency of use.  Asthma control goals:  Full participation in all desired activities (may need albuterol before activity) Albuterol use two times or less a week on average (not counting use with activity) Cough interfering with sleep two times or less a month Oral steroids no more than once a year No hospitalizations  Other allergic rhinitis  Allergy testing was positive to: dust mites, grass pollen, tree pollen and ragweed pollen.  Continue appropriate allergen avoidance measures  Continue montelukast 10 mg daily at bedtime.  May use over the counter antihistamines such as Zyrtec (cetirizine), Claritin (loratadine), Allegra (fexofenadine), or Xyzal (levocetirizine) daily as needed.   May use azelastine nasal spray 1-2 sprays per nostril twice a day as needed for drainage  May use fluticasone nasal spray, 1 spray per nostril twice a day.   Nasal saline lavage (NeilMed) has been recommended as needed and prior to medicated nasal sprays along with instructions for proper administration.   Gastroesophageal reflux disease without esophagitis  Continue omeprazole as prescribed.  Follow up in 3 months or sooner if needed.   Control of House Dust Mite Allergen . Dust mite allergens are a common trigger of allergy and asthma symptoms. While they can  be found throughout the house, these microscopic creatures thrive in warm, humid environments such as bedding, upholstered furniture and carpeting. . Because so much time is spent in the bedroom, it is essential to reduce mite levels there.  . Encase pillows, mattresses, and box springs in special allergen-proof fabric covers or airtight, zippered plastic covers.  . Bedding should be washed weekly in hot water (130 F) and dried in a hot dryer. Allergen-proof covers are available for comforters and pillows that can't be regularly washed.  Wendee Copp the allergy-proof covers every few months. Minimize clutter in the bedroom. Keep pets out of the bedroom.  Marland Kitchen Keep humidity less than 50% by using a dehumidifier or air conditioning. You can buy a humidity measuring device called a hygrometer to monitor this.  . If possible, replace carpets with hardwood, linoleum, or washable area rugs. If that's not possible, vacuum frequently with a vacuum that has a HEPA filter. . Remove all upholstered furniture and non-washable window drapes from the bedroom. . Remove all non-washable stuffed toys from the bedroom.  Wash stuffed toys weekly. Reducing Pollen Exposure . Pollen seasons: trees (spring), grass (summer) and ragweed/weeds (fall). Marland Kitchen Keep windows closed in your home and car to lower pollen exposure.  Susa Simmonds air conditioning in the bedroom and throughout the house if possible.  . Avoid going out in dry windy days - especially early morning. . Pollen counts are highest between 5 - 10 AM and on dry, hot and windy days.  . Save outside activities for late afternoon or after a heavy rain, when pollen levels are lower.  . Avoid mowing of  grass if you have grass pollen allergy. Marland Kitchen Be aware that pollen can also be transported indoors on people and pets.  . Dry your clothes in an automatic dryer rather than hanging them outside where they might collect pollen.  . Rinse hair and eyes before bedtime.

## 2020-05-20 NOTE — Assessment & Plan Note (Signed)
Past history - Some mild rhino conjunctivitis symptoms. 2021 bloodwork was positive to dust mites, grass pollen, tree pollen and ragweed pollen. Interim history - stable.  Continue appropriate allergen avoidance measures  Continue montelukast 10 mg daily at bedtime.  May use over the counter antihistamines such as Zyrtec (cetirizine), Claritin (loratadine), Allegra (fexofenadine), or Xyzal (levocetirizine) daily as needed.  May use azelastine nasal spray 1-2 sprays per nostril twice a day as needed for drainage  May use fluticasone nasal spray, 1 spray per nostril twice a day.   Nasal saline lavage (NeilMed) has been recommended as needed and prior to medicated nasal sprays along with instructions for proper administration.

## 2020-05-20 NOTE — Assessment & Plan Note (Signed)
Past history - Usually has issues with his breathing twice a year - in the spring and fall. Follows with cardiology and apparently also with pulmonology. Ex-smoker. Interim history - Nucala not covered so switched to [place] in [place] 2022. Doing well with below regimen.  Today's spirometry showed restriction - improved from previous one.  Daily controller medication(s):   START Breztri 2 puffs twice a day with spacer and rinse mouth afterwards.  This replaces Symbicort and Spiriva - stop these 2 inhalers for now.  If Judithann Sauger is not covered then let us know.  Continue Alvesco 53mcg 2 puffs twice a day with spacer and rinse mouth afterwards. Continue with Singulair 10mg  daily at night.  Continue Fasenra injections.  May use albuterol rescue inhaler 2 puffs every 4 to 6 hours as needed for shortness of breath, chest tightness, coughing, and wheezing. May use albuterol rescue inhaler 2 puffs 5 to 15 minutes prior to strenuous physical activities. Monitor frequency of use. Repeat spirometry at next visit.

## 2020-05-28 ENCOUNTER — Other Ambulatory Visit: Payer: Self-pay | Admitting: Allergy

## 2020-05-28 ENCOUNTER — Other Ambulatory Visit: Payer: Self-pay | Admitting: Internal Medicine

## 2020-05-28 NOTE — Telephone Encounter (Signed)
Please advise to refill as last avs stated to stop spiriva and symbicort and do the breztri

## 2020-05-28 NOTE — Telephone Encounter (Signed)
Can you call patient and see if the breztri was denied?

## 2020-06-06 ENCOUNTER — Ambulatory Visit: Payer: TRICARE For Life (TFL)

## 2020-06-11 DIAGNOSIS — J4550 Severe persistent asthma, uncomplicated: Secondary | ICD-10-CM | POA: Diagnosis not present

## 2020-06-12 ENCOUNTER — Ambulatory Visit (INDEPENDENT_AMBULATORY_CARE_PROVIDER_SITE_OTHER): Payer: Medicare Other

## 2020-06-12 DIAGNOSIS — J4550 Severe persistent asthma, uncomplicated: Secondary | ICD-10-CM | POA: Diagnosis not present

## 2020-06-12 MED ORDER — BENRALIZUMAB 30 MG/ML ~~LOC~~ SOSY
30.0000 mg | PREFILLED_SYRINGE | Freq: Once | SUBCUTANEOUS | Status: AC
Start: 2020-06-12 — End: 2020-06-12
  Administered 2020-06-12: 30 mg via SUBCUTANEOUS

## 2020-07-10 ENCOUNTER — Ambulatory Visit: Payer: TRICARE For Life (TFL)

## 2020-07-15 DIAGNOSIS — J4550 Severe persistent asthma, uncomplicated: Secondary | ICD-10-CM | POA: Diagnosis not present

## 2020-07-16 ENCOUNTER — Ambulatory Visit (INDEPENDENT_AMBULATORY_CARE_PROVIDER_SITE_OTHER): Payer: Medicare Other | Admitting: *Deleted

## 2020-07-16 ENCOUNTER — Other Ambulatory Visit: Payer: Self-pay

## 2020-07-16 DIAGNOSIS — J4550 Severe persistent asthma, uncomplicated: Secondary | ICD-10-CM | POA: Diagnosis not present

## 2020-07-16 MED ORDER — BENRALIZUMAB 30 MG/ML ~~LOC~~ SOSY
30.0000 mg | PREFILLED_SYRINGE | Freq: Once | SUBCUTANEOUS | Status: AC
Start: 2020-07-16 — End: 2020-07-16
  Administered 2020-07-16: 30 mg via SUBCUTANEOUS

## 2020-08-22 ENCOUNTER — Ambulatory Visit (INDEPENDENT_AMBULATORY_CARE_PROVIDER_SITE_OTHER): Payer: Medicare Other | Admitting: Internal Medicine

## 2020-08-22 ENCOUNTER — Other Ambulatory Visit: Payer: Self-pay

## 2020-08-22 ENCOUNTER — Encounter: Payer: Self-pay | Admitting: Internal Medicine

## 2020-08-22 VITALS — BP 124/62 | HR 69 | Ht 65.0 in | Wt 192.8 lb

## 2020-08-22 DIAGNOSIS — I272 Pulmonary hypertension, unspecified: Secondary | ICD-10-CM

## 2020-08-22 DIAGNOSIS — I5032 Chronic diastolic (congestive) heart failure: Secondary | ICD-10-CM

## 2020-08-22 DIAGNOSIS — I10 Essential (primary) hypertension: Secondary | ICD-10-CM

## 2020-08-22 NOTE — Patient Instructions (Signed)

## 2020-08-22 NOTE — Progress Notes (Signed)
OFFICE NOTE  Chief Complaint:  Hospital follow-up  Primary Care Physician: [name], MD  HPI:  Douglas Hansen is a pleasant 75 year old male who is currently referred to me by Dr. Maudie Mercury. His past Douglas Hansen history is extensive and includes the following: Hypertension, dyslipidemia, type 2 diabetes, COPD, obstructive sleep apnea on CPAP, GERD, mild PAD, and significant allergic symptoms. He was previously followed by Dr. Einar Gip and has had 2 cardiac catheterizations in his past. His last heart catheterization was in 2004 which showed no significant coronary disease. He had a stress test in February 2014 which was negative for ischemia and EF of 68%. He also had ABIs for leg pain which were 0.9 on the right and 0.94 on the left. He was started on Pletal but had some shortness of breath and abnormal symptoms on it and this was discontinued. His last echocardiogram was in March 2014 showed an EF of 52% with mild to moderate right atrial enlargement, dilated IVC and elevated right atrial pressure. Recently had an upper respiratory infection concerning for COPD exacerbation. He was started on Avelox which she completed. At the end of treatment he felt some improvement in his breathing but then started to have shortness of breath again and cough. He was switched to azithromycin and placed on a prednisone Dosepak. He was also started on low-dose Lasix for chest x-ray findings of some fluid in the right fissure. Douglas Hansen says that he gained a few pounds in weight with this shortness of breath and noted that he's been on Lasix now for 10 days and that he's lost some weight and feels less fullness in his belly and in his chest. He denied any ankle edema. His shortness of breath is improving. He also denies any chest pain or anginal symptoms. He had a mild amount of orthopnea which is also resolving.   Douglas Hansen returns today for follow-up of his echocardiogram. This showed normal LV function with mild pulmonary  hypertension and elevated RVSP of 35 mmHg. This is likely secondary to COPD, but does not explain his shortness of breath. He is currently on Lasix 20 mg daily has not noted a significant improvement in his shortness of breath.  06/02/2016  Douglas Hansen was seen today in follow-up. Overall he seems to be doing well. Blood pressure initially was elevated 162/84. Weight is down from 196-185 pounds. He denies any chest pain. He is short of breath with exertion however it is difficult to tell whether this is related to COPD or not. He has reported some pain in both legs when walking. He says it seems to come on after walking certain distances and gets better at rest, reminiscent of possible claudication.  09/14/2016  Douglas Hansen returns today for follow-up. He still has concerns about bilateral leg pain with walking. He underwent ABIs and lower extremity arterial Dopplers which were normal. He also underwent a nuclear stress test because worsening shortness of breath and was found to have no evidence of ischemia with normal LV function. He's required increasing use of his inhalers recently and I suspect his symptoms may be related to COPD.  12/09/2017  Douglas Hansen is seen today for routine follow-up.  Overall he seems to be doing well.  He says his shortness of breath is been well controlled on inhalers.  Blood pressure is at goal today 128/84.  EKG shows sinus rhythm at 71 with stable inferior and anterior Q waves.  He denies any new chest pain or new  significant cardiac symptoms.  His only other major concern today is over the past 1 to 2 months he has had significant, drenching night sweats.  There is been no associated weight loss or change in appetite, but he is somewhat disturbed by this.  Labs from January 2019 showed total cholesterol 107, HDL 44, LDL 42 and triglycerides 107.  Hemoglobin A1c was 6.2.  04/07/2019  Douglas Hansen returns today for follow-up.  He was referred back by his PCP for shortness of  breath.  He reports possibly a slightly worsening shortness of breath when walking.  He does have COPD on inhalers.  An echo was performed 3 years ago which showed some mild pulmonary hypertension with RVSP 35 mmHg.  He had stress testing in 2018 which showed no ischemia.  He had a heart cath in 2004 which showed no coronary disease.  06/07/2019  Douglas Hansen returns today for follow-up of his echo.  He reports some persistent shortness of breath.  EKG today is normal.  In February 2021 showed 60 to 65% with moderate LVH and grade 1 diastolic dysfunction.  LV filling pressures were elevated.  There was mildly elevated pulmonary artery pressure.  Findings suggestive of some volume overload.  I discussed the findings with him today.  He noted that there was 1 day where he normally takes 20 mg of furosemide, however he cannot remember if he took the medicine and therefore took an additional medicine.  Later he realized he had taken it however after taking 2 doses he noted a significant improvement in his breathing.  02/15/2020  Douglas Hansen returns today for follow-up.  He was recently hospitalized with unspecified chest pain and felt to have acute diastolic congestive heart failure.  He ruled out for MI and underwent CT coronary angiography which demonstrated 4 of 209, 44th percentile for age and sex matched control.  There was moderate stenosis in the proximal RCA and proximal portion of the first diagonal artery.  CT FFR was negative.  A PFO was noted.  The pulmonary artery was dilated suggestive of pulmonary hypertension.  He responded to diuresis.  Since discharge he has done well and is breathing better.  He did have follow-up with Dr. Julianne Rice office.  Blood pressure slightly elevated today but at home he says is around 660-630 systolic.  He is on low-dose furosemide.  08/22/2020  Douglas Hansen seen today for follow-up.  Overall he seems to be doing well.  The past 6 months he has been stable.  Weight has not  changed at all.  He denies any worsening shortness of breath or edema.  Blood pressures well controlled.  He has struggled with some allergies but sees an allergist.  His EKG shows a sinus rhythm.  PMHx:  Past Medical History:  Diagnosis Date   Agent orange exposure    Arthritis    Asthma    COPD (chronic obstructive pulmonary disease) ([place])    Diabetes mellitus    Hyperlipemia    Hypertension    OSA on CPAP    Pneumonia    PTSD (post-traumatic stress disorder)     Past Surgical History:  Procedure Laterality Date   CARDIAC CATHETERIZATION     right thumb surgery     TONSILLECTOMY      FAMHx:  Family History  Problem Relation Age of Onset   Emphysema Father    Asthma Father    Heart disease Father    Heart attack Father    Stroke Mother  Heart attack Brother    Diabetes Brother    Heart attack Brother    Diabetes Brother    Colon cancer Neg Hx     SOCHx:   reports that he quit smoking about 35 years ago. His smoking use included cigarettes. He has a 20.00 pack-year smoking history. He has never used smokeless tobacco. He reports that he does not drink alcohol and does not use drugs.  ALLERGIES:  Allergies  Allergen Reactions   Codeine Nausea Only and Other (See Comments)    GI upset   Lipitor [Atorvastatin] Other (See Comments)    "Made my stomach burn"   Pravachol [Pravastatin Sodium] Other (See Comments)    Caused joint pain    ROS: Pertinent items noted in HPI and remainder of comprehensive ROS otherwise negative.  HOME MEDS: Current Outpatient Medications  Medication Sig Dispense Refill   albuterol (VENTOLIN HFA) 108 (90 Base) MCG/ACT inhaler INHALE 2 PUFFS BY MOUTH EVERY 4 HOURS AS NEEDED FOR WHEEZING OR SHORTNESS OF BREATH 42.5 g 0   amLODipine (NORVASC) 10 MG tablet Take 10 mg by mouth daily.     amLODipine-olmesartan (AZOR) 10-40 MG tablet Take 1 tablet by mouth daily.     amoxicillin (AMOXIL) 500 MG capsule Take 1 capsule by mouth 3 (three)  times daily.     ascorbic acid (VITAMIN C) 500 MG tablet Take 1,000 mg by mouth daily.      aspirin EC 325 MG tablet Take 325 mg by mouth daily.      Budeson-Glycopyrrol-Formoterol (BREZTRI AEROSPHERE) 160-9-4.8 MCG/ACT AERO Inhale 2 puffs into the lungs in the morning and at bedtime. with spacer and rinse mouth afterwards. 10.7 g 5   budesonide-formoterol (SYMBICORT) 80-4.5 MCG/ACT inhaler Inhale 2 puffs into the lungs 2 (two) times daily.     calcipotriene (DOVONOX) 0.005 % cream Apply topically as needed.     carvedilol (COREG) 12.5 MG tablet Take 6.25 mg by mouth 2 (two) times daily with a meal.      Cellulose Carmellose Sodium POWD Apply 1 application topically daily as needed (irritation). Apply to feet     chlorhexidine (PERIDEX) 0.12 % solution Take by mouth.     Cholecalciferol (VITAMIN D-3 PO) Take 1 capsule by mouth daily.     ciclesonide (ALVESCO) 80 MCG/ACT inhaler Inhale 2 puffs into the lungs 2 (two) times daily.      clobetasol ointment (TEMOVATE) 6.21 % Apply 1 application topically 2 (two) times daily as needed (to affected area).      Diclofenac Sodium 1.5 % SOLN Apply 4 drops topically 4 (four) times daily as needed (as directed- to affected area).   0   EYLEA 2 MG/0.05ML SOLN Place into the left eye every 30 (thirty) days. INJECTION     Fluocinolone Acetonide Body 0.01 % OIL Apply 1 application topically See admin instructions. Apply a small amount to affected area daily     fluticasone (FLONASE) 50 MCG/ACT nasal spray Place 1 spray into both nostrils daily. 16 g 0   furosemide (LASIX) 20 MG tablet Take 20 mg by mouth daily.     guaifenesin (HUMIBID E) 400 MG TABS tablet Take 1 tablet by mouth 2 (two) times daily.     ketoconazole (NIZORAL) 2 % shampoo Apply 1 application topically 3 (three) times a week.     metFORMIN (GLUCOPHAGE) 500 MG tablet Take 250 mg by mouth 2 (two) times daily as needed (for elevated BGL).     montelukast (SINGULAIR) 10 MG  tablet Take 10 mg by mouth  at bedtime.      Multiple Vitamins-Minerals (MULTIVITAMIN & MINERAL PO) Take 1 tablet by mouth daily. Reported on 5/39/7673     OneTouch Delica Lancets 41P MISC daily.     ONETOUCH ULTRA test strip as needed.      Respiratory Therapy Supplies (FLUTTER) DEVI Use as directed 1 each 0   rosuvastatin (CRESTOR) 20 MG tablet Take 10 mg by mouth at bedtime.      sertraline (ZOLOFT) 100 MG tablet Take 100 mg by mouth daily.     simethicone (MYLICON) 80 MG chewable tablet Chew 80 mg by mouth daily.     tiotropium (SPIRIVA HANDIHALER) 18 MCG inhalation capsule as needed.     EPINEPHrine 0.3 mg/0.3 mL IJ SOAJ injection Inject 0.3 mLs (0.3 mg total) into the muscle as needed for anaphylaxis. (Patient not taking: Reported on 08/22/2020) 2 each 1   No current facility-administered medications for this visit.    LABS/IMAGING: No results found for this or any previous visit (from the past 48 hour(s)). No results found.  WEIGHTS: Wt Readings from Last 3 Encounters:  08/22/20 192 lb 12.8 oz (87.5 kg)  05/20/20 191 lb 3.2 oz (86.7 kg)  02/15/20 195 lb 6.4 oz (88.6 kg)    VITALS: BP 124/62 (BP Location: Left Arm, Patient Position: Sitting, Cuff Size: Normal)   Pulse 69   Ht 5\' 5"  (1.651 m)   Wt 192 lb 12.8 oz (87.5 kg)   SpO2 94%   BMI 32.08 kg/m   EXAM: General appearance: alert and no distress Neck: no carotid bruit, no JVD and thyroid not enlarged, symmetric, no tenderness/mass/nodules Lungs: clear to auscultation bilaterally Heart: regular rate and rhythm Abdomen: soft, non-tender; bowel sounds normal; no masses,  no organomegaly Extremities: extremities normal, atraumatic, no cyanosis or edema Pulses: 2+ and symmetric Skin: Skin color, texture, turgor normal. No rashes or lesions Neurologic: Grossly normal Psych: Pleasant  EKG: Normal sinus rhythm 69, inferior infarct pattern personally reviewed  ASSESSMENT: Acute on chronic diastolic congestive heart failure -LVEF 65 to 70% Mild  to moderate nonobstructive coronary disease, CAD RADS 3, CAC score 209 (44th percentile)-12/2019 Dyspnea on exertion - normal LVEF 65-70%, DD, mild pulm HTN, low risk Myoview-negative for ischemia (06/2016) Leg pain concerning for claudication- history of mildly reduced ABIs COPD with acute exacerbation Hypertension Dyslipidemia OSA on CPAP Type 2 diabetes  PLAN: 1.   Douglas Hansen has done very well with stable heart failure symptoms.  Weight is not changed.  He denies any worsening edema.  He has had no chest pain.  Plan to continue his current medications.  We will follow-up with him annually or sooner as necessary.  Pixie Casino, MD, [facility], [place] Director of the [facility]  [facility] Diplomate of the American Board of Clinical Lipidology Attending Cardiologist  Direct Dial: [phone]  Fax: [phone]  Website:  [email or web address] 08/22/2020, 2:09 PM

## 2020-08-28 ENCOUNTER — Other Ambulatory Visit: Payer: Self-pay

## 2020-08-28 ENCOUNTER — Encounter: Payer: Self-pay | Admitting: Allergy

## 2020-08-28 ENCOUNTER — Ambulatory Visit (INDEPENDENT_AMBULATORY_CARE_PROVIDER_SITE_OTHER): Payer: Medicare Other | Admitting: Allergy

## 2020-08-28 VITALS — BP 110/58 | HR 80 | Temp 98.2°F | Resp 16

## 2020-08-28 DIAGNOSIS — J449 Chronic obstructive pulmonary disease, unspecified: Secondary | ICD-10-CM

## 2020-08-28 DIAGNOSIS — K219 Gastro-esophageal reflux disease without esophagitis: Secondary | ICD-10-CM

## 2020-08-28 DIAGNOSIS — J302 Other seasonal allergic rhinitis: Secondary | ICD-10-CM | POA: Diagnosis not present

## 2020-08-28 DIAGNOSIS — J3089 Other allergic rhinitis: Secondary | ICD-10-CM | POA: Diagnosis not present

## 2020-08-28 MED ORDER — MONTELUKAST SODIUM 10 MG PO TABS: 10.0000 mg | ORAL_TABLET | Freq: Every day | ORAL | 5 refills | Status: AC

## 2020-08-28 NOTE — Assessment & Plan Note (Signed)
Stable.  Continue appropriate reflux list on modifications and omeprazole as prescribed.

## 2020-08-28 NOTE — Patient Instructions (Addendum)
COPD/asthma Daily controller medication(s):  Use Breztri 2 puffs twice a day with spacer and rinse mouth afterwards. Use Alvesco 47mcg 2 puffs twice a day with spacer and rinse mouth afterwards. STOP Symbicort.   Continue with Singulair 10mg  daily at night.  Continue Fasenra injections.  May use albuterol rescue inhaler 2 puffs every 4 to 6 hours as needed for shortness of breath, chest tightness, coughing, and wheezing. May use albuterol rescue inhaler 2 puffs 5 to 15 minutes prior to strenuous physical activities. Monitor frequency of use.  Asthma control goals:  Full participation in all desired activities (may need albuterol before activity) Albuterol use two times or less a week on average (not counting use with activity) Cough interfering with sleep two times or less a month Oral steroids no more than once a year No hospitalizations  Other allergic rhinitis Allergy testing was positive to: dust mites, grass pollen, tree pollen and ragweed pollen. Continue appropriate allergen avoidance measures Continue montelukast 10 mg daily at bedtime. May use over the counter antihistamines such as Zyrtec (cetirizine), Claritin (loratadine), Allegra (fexofenadine), or Xyzal (levocetirizine) daily as needed.  May use azelastine nasal spray 1-2 sprays per nostril twice a day as needed for drainage May use fluticasone nasal spray, ONE spray per nostril twice a day.  Nasal saline lavage (NeilMed) has been recommended as needed and prior to medicated nasal sprays along with instructions for proper administration.    Gastroesophageal reflux disease without esophagitis Continue omeprazole as prescribed.  Follow up in 3 months or sooner if needed.   Control of House Dust Mite Allergen Dust mite allergens are a common trigger of allergy and asthma symptoms. While they can be found throughout the house, these microscopic creatures thrive in warm, humid environments such as bedding, upholstered  furniture and carpeting. Because so much time is spent in the bedroom, it is essential to reduce mite levels there.  Encase pillows, mattresses, and box springs in special allergen-proof fabric covers or airtight, zippered plastic covers.  Bedding should be washed weekly in hot water (130 F) and dried in a hot dryer. Allergen-proof covers are available for comforters and pillows that can't be regularly washed.  Wash the allergy-proof covers every few months. Minimize clutter in the bedroom. Keep pets out of the bedroom.  Keep humidity less than 50% by using a dehumidifier or air conditioning. You can buy a humidity measuring device called a hygrometer to monitor this.  If possible, replace carpets with hardwood, linoleum, or washable area rugs. If that's not possible, vacuum frequently with a vacuum that has a HEPA filter. Remove all upholstered furniture and non-washable window drapes from the bedroom. Remove all non-washable stuffed toys from the bedroom.  Wash stuffed toys weekly. Reducing Pollen Exposure Pollen seasons: trees (spring), grass (summer) and ragweed/weeds (fall). Keep windows closed in your home and car to lower pollen exposure.  Install air conditioning in the bedroom and throughout the house if possible.  Avoid going out in dry windy days - especially early morning. Pollen counts are highest between 5 - 10 AM and on dry, hot and windy days.  Save outside activities for late afternoon or after a heavy rain, when pollen levels are lower.  Avoid mowing of grass if you have grass pollen allergy. Be aware that pollen can also be transported indoors on people and pets.  Dry your clothes in an automatic dryer rather than hanging them outside where they might collect pollen.  Rinse hair and eyes before bedtime.

## 2020-08-28 NOTE — Assessment & Plan Note (Signed)
Past history - Usually has issues with his breathing twice a year - in the spring and fall. Follows with cardiology and apparently also with pulmonology. Ex-smoker. Nucala not covered.  Interim history - some confusion as to which inhalers he was supposed to be taking.   Today's spirometry showed restriction - worse from previous one.   ACT score 20.  Daily controller medication(s):   Use Breztri 2 puffs twice a day with spacer and rinse mouth afterwards. . Use Alvesco 62mcg 2 puffs twice a day with spacer and rinse mouth afterwards. Marland Kitchen STOP Symbicort.  . Continue with Singulair 10mg  daily at night.  . Continue Fasenra injections.  . May use albuterol rescue inhaler 2 puffs every 4 to 6 hours as needed for shortness of breath, chest tightness, coughing, and wheezing. May use albuterol rescue inhaler 2 puffs 5 to 15 minutes prior to strenuous physical activities. Monitor frequency of use.   Get spirometry at next visit.

## 2020-08-28 NOTE — Progress Notes (Signed)
Follow Up Note  RE: Douglas Hansen MRN: [id number] DOB: 04-Nov-1945 Date of Office Visit: 08/28/2020  Referring provider: [name], MD Primary care provider: Janie Morning, DO  Chief Complaint: Asthma (Had some shortness of breath but other then been okay)  History of Present Illness: I had the pleasure of seeing Douglas Hansen for a follow up visit at the [facility] on 08/28/2020. He is a 75 y.o. male, who is being followed for asthma/COPD on Fasenra, allergic rhinitis and GERD. His previous allergy office visit was on 05/20/2020 with Dr. Maudie Mercury. Today is a regular follow up visit.  Asthma-COPD overlap syndrome ACT score 20. Some dyspnea on exertion.   Currently on Fasenra injections every 8 weeks and may have noted some improvement.   Currently taking Symbicort 2 puffs in the morning and Breztri 2 puffs in the evenings. Only using Alvesco and albuterol as needed - about once per week. Still taking Singulair at night. Not using Spiriva.    Seasonal and perennial allergic rhinitis Some nasal congestion - using Flonase 2 sprays per nostril twice a day. No nosebleeds. Takes zyrtec prn.   Gastroesophageal reflux disease without esophagitis Stable.  Assessment and Plan: Douglas Hansen is a 75 y.o. male with: Asthma-COPD overlap syndrome ([place]) Past history - Usually has issues with his breathing twice a year - in the spring and fall. Follows with cardiology and apparently also with pulmonology. Ex-smoker. Nucala not covered.  Interim history - some confusion as to which inhalers he was supposed to be taking.  Today's spirometry showed restriction - worse from previous one.  ACT score 20. Daily controller medication(s):  Use Breztri 2 puffs twice a day with spacer and rinse mouth afterwards. Use Alvesco 16mcg 2 puffs twice a day with spacer and rinse mouth afterwards. STOP Symbicort.  Continue with Singulair 10mg  daily at night.  Continue Fasenra injections.  May use  albuterol rescue inhaler 2 puffs every 4 to 6 hours as needed for shortness of breath, chest tightness, coughing, and wheezing. May use albuterol rescue inhaler 2 puffs 5 to 15 minutes prior to strenuous physical activities. Monitor frequency of use.  Get spirometry at next visit.  Seasonal and perennial allergic rhinitis Past history - Some mild rhino conjunctivitis symptoms. 2021 bloodwork was positive to dust mites, grass pollen, tree pollen and ragweed pollen. Interim history - some nasal congestion. Continue appropriate allergen avoidance measures Continue montelukast 10 mg daily at bedtime. May use over the counter antihistamines such as Zyrtec (cetirizine), Claritin (loratadine), Allegra (fexofenadine), or Xyzal (levocetirizine) daily as needed. May use azelastine nasal spray 1-2 sprays per nostril twice a day as needed for drainage May use fluticasone nasal spray, ONE spray per nostril twice a day.  Nasal saline lavage (NeilMed) has been recommended as needed and prior to medicated nasal sprays along with instructions for proper administration.   Return in about 3 months (around 11/28/2020).  Meds ordered this encounter  Medications   montelukast (SINGULAIR) 10 MG tablet    Sig: Take 1 tablet (10 mg total) by mouth at bedtime.    Dispense:  30 tablet    Refill:  5    Lab Orders  No laboratory test(s) ordered today    Diagnostics: Spirometry:  Tracings reviewed. His effort: Good reproducible efforts. FVC: 1.39L FEV1: 1.03L, 46% predicted FEV1/FVC ratio: 74% Interpretation: Spirometry consistent with possible restrictive disease.  Please see scanned spirometry results for details.  Medication List:  Current Outpatient Medications  Medication Sig Dispense  Refill   albuterol (VENTOLIN HFA) 108 (90 Base) MCG/ACT inhaler INHALE 2 PUFFS BY MOUTH EVERY 4 HOURS AS NEEDED FOR WHEEZING OR SHORTNESS OF BREATH 42.5 g 0   amLODipine (NORVASC) 10 MG tablet Take 10 mg by mouth daily.      amLODipine-olmesartan (AZOR) 10-40 MG tablet Take 1 tablet by mouth daily.     ascorbic acid (VITAMIN C) 500 MG tablet Take 1,000 mg by mouth daily.      aspirin EC 325 MG tablet Take 325 mg by mouth daily.      Budeson-Glycopyrrol-Formoterol (BREZTRI AEROSPHERE) 160-9-4.8 MCG/ACT AERO Inhale 2 puffs into the lungs in the morning and at bedtime. with spacer and rinse mouth afterwards. 10.7 g 5   calcipotriene (DOVONOX) 0.005 % cream Apply topically as needed.     carvedilol (COREG) 12.5 MG tablet Take 6.25 mg by mouth 2 (two) times daily with a meal.      Cellulose Carmellose Sodium POWD Apply 1 application topically daily as needed (irritation). Apply to feet     chlorhexidine (PERIDEX) 0.12 % solution Take by mouth.     Cholecalciferol (VITAMIN D-3 PO) Take 1 capsule by mouth daily.     ciclesonide (ALVESCO) 80 MCG/ACT inhaler Inhale 2 puffs into the lungs 2 (two) times daily.      clobetasol ointment (TEMOVATE) 4.00 % Apply 1 application topically 2 (two) times daily as needed (to affected area).      Diclofenac Sodium 1.5 % SOLN Apply 4 drops topically 4 (four) times daily as needed (as directed- to affected area).   0   EPINEPHrine 0.3 mg/0.3 mL IJ SOAJ injection Inject 0.3 mLs (0.3 mg total) into the muscle as needed for anaphylaxis. 2 each 1   EYLEA 2 MG/0.05ML SOLN Place into the left eye every 30 (thirty) days. INJECTION     Fluocinolone Acetonide Body 0.01 % OIL Apply 1 application topically See admin instructions. Apply a small amount to affected area daily     fluticasone (FLONASE) 50 MCG/ACT nasal spray Place 1 spray into both nostrils daily. 16 g 0   furosemide (LASIX) 20 MG tablet Take 20 mg by mouth daily.     guaifenesin (HUMIBID E) 400 MG TABS tablet Take 1 tablet by mouth 2 (two) times daily.     ketoconazole (NIZORAL) 2 % shampoo Apply 1 application topically 3 (three) times a week.     metFORMIN (GLUCOPHAGE) 500 MG tablet Take 250 mg by mouth 2 (two) times daily as needed  (for elevated BGL).     Multiple Vitamins-Minerals (MULTIVITAMIN & MINERAL PO) Take 1 tablet by mouth daily. Reported on 8/67/6195     OneTouch Delica Lancets 09T MISC daily.     ONETOUCH ULTRA test strip as needed.      Respiratory Therapy Supplies (FLUTTER) DEVI Use as directed 1 each 0   sertraline (ZOLOFT) 100 MG tablet Take 100 mg by mouth daily.     simethicone (MYLICON) 80 MG chewable tablet Chew 80 mg by mouth daily.     montelukast (SINGULAIR) 10 MG tablet Take 1 tablet (10 mg total) by mouth at bedtime. 30 tablet 5   No current facility-administered medications for this visit.   Allergies: Allergies  Allergen Reactions   Codeine Nausea Only and Other (See Comments)    GI upset   Lipitor [Atorvastatin] Other (See Comments)    "Made my stomach burn"   Pravachol [Pravastatin Sodium] Other (See Comments)    Caused joint pain   I  reviewed his past medical history, social history, family history, and environmental history and no significant changes have been reported from his previous visit.  Review of Systems  Constitutional:  Negative for appetite change, chills, fever and unexpected weight change.  HENT:  Positive for congestion. Negative for rhinorrhea.   Eyes:  Negative for itching.  Respiratory:  Positive for shortness of breath. Negative for cough, chest tightness and wheezing.   Gastrointestinal:  Negative for abdominal pain.  Skin:  Negative for rash.  Allergic/Immunologic: Positive for environmental allergies.  Neurological:  Negative for headaches.   Objective: BP (!) 110/58   Pulse 80   Temp 98.2 F (36.8 C) (Temporal)   Resp 16   SpO2 92%  There is no height or weight on file to calculate BMI. Physical Exam Vitals and nursing note reviewed.  Constitutional:      Appearance: Normal appearance. He is well-developed.  HENT:     Head: Normocephalic and atraumatic.     Right Ear: Tympanic membrane and external ear normal.     Left Ear: Tympanic membrane and  external ear normal.     Nose: Nose normal.     Mouth/Throat:     Mouth: Mucous membranes are moist.     Pharynx: Oropharynx is clear.  Eyes:     Conjunctiva/sclera: Conjunctivae normal.  Cardiovascular:     Rate and Rhythm: Normal rate and regular rhythm.     Heart sounds: Normal heart sounds. No murmur heard. Pulmonary:     Effort: Pulmonary effort is normal.     Breath sounds: Normal breath sounds. No wheezing, rhonchi or rales.  Musculoskeletal:     Cervical back: Neck supple.  Skin:    General: Skin is warm.     Findings: No rash.  Neurological:     Mental Status: He is alert and oriented to person, place, and time.  Psychiatric:        Behavior: Behavior normal.  Previous notes and tests were reviewed. The plan was reviewed with the patient/family, and all questions/concerned were addressed.  It was my pleasure to see Bladen today and participate in his care. Please feel free to contact me with any questions or concerns.  Sincerely,  Rexene Alberts, DO Allergy & Immunology  [facility] office: [place] office: [phone]

## 2020-08-28 NOTE — Assessment & Plan Note (Signed)
Past history - Some mild rhino conjunctivitis symptoms. 2021 bloodwork was positive to dust mites, grass pollen, tree pollen and ragweed pollen. Interim history - some nasal congestion.  Continue appropriate allergen avoidance measures  Continue montelukast 10 mg daily at bedtime.  May use over the counter antihistamines such as Zyrtec (cetirizine), Claritin (loratadine), Allegra (fexofenadine), or Xyzal (levocetirizine) daily as needed.  May use azelastine nasal spray 1-2 sprays per nostril twice a day as needed for drainage  May use fluticasone nasal spray, ONE spray per nostril twice a day.   Nasal saline lavage (NeilMed) has been recommended as needed and prior to medicated nasal sprays along with instructions for proper administration.

## 2020-09-06 DIAGNOSIS — J4550 Severe persistent asthma, uncomplicated: Secondary | ICD-10-CM | POA: Diagnosis not present

## 2020-09-10 ENCOUNTER — Ambulatory Visit (INDEPENDENT_AMBULATORY_CARE_PROVIDER_SITE_OTHER): Payer: Medicare Other | Admitting: *Deleted

## 2020-09-10 ENCOUNTER — Other Ambulatory Visit: Payer: Self-pay

## 2020-09-10 DIAGNOSIS — J4550 Severe persistent asthma, uncomplicated: Secondary | ICD-10-CM | POA: Diagnosis not present

## 2020-09-10 MED ORDER — BENRALIZUMAB 30 MG/ML ~~LOC~~ SOSY
30.0000 mg | PREFILLED_SYRINGE | SUBCUTANEOUS | Status: AC
Start: 2020-09-10 — End: ?
  Administered 2020-09-10 – 2020-12-31 (×3): 30 mg via SUBCUTANEOUS

## 2020-09-25 DIAGNOSIS — E559 Vitamin D deficiency, unspecified: Secondary | ICD-10-CM | POA: Diagnosis not present

## 2020-09-25 DIAGNOSIS — E785 Hyperlipidemia, unspecified: Secondary | ICD-10-CM | POA: Diagnosis not present

## 2020-09-25 DIAGNOSIS — I10 Essential (primary) hypertension: Secondary | ICD-10-CM | POA: Diagnosis not present

## 2020-09-25 DIAGNOSIS — E1151 Type 2 diabetes mellitus with diabetic peripheral angiopathy without gangrene: Secondary | ICD-10-CM | POA: Diagnosis not present

## 2020-09-26 DIAGNOSIS — M8589 Other specified disorders of bone density and structure, multiple sites: Secondary | ICD-10-CM | POA: Diagnosis not present

## 2020-10-01 DIAGNOSIS — M25531 Pain in right wrist: Secondary | ICD-10-CM | POA: Diagnosis not present

## 2020-10-01 DIAGNOSIS — G4733 Obstructive sleep apnea (adult) (pediatric): Secondary | ICD-10-CM | POA: Diagnosis not present

## 2020-10-01 DIAGNOSIS — I10 Essential (primary) hypertension: Secondary | ICD-10-CM | POA: Diagnosis not present

## 2020-10-01 DIAGNOSIS — E1151 Type 2 diabetes mellitus with diabetic peripheral angiopathy without gangrene: Secondary | ICD-10-CM | POA: Diagnosis not present

## 2020-10-01 DIAGNOSIS — N400 Enlarged prostate without lower urinary tract symptoms: Secondary | ICD-10-CM | POA: Diagnosis not present

## 2020-10-01 DIAGNOSIS — F329 Major depressive disorder, single episode, unspecified: Secondary | ICD-10-CM | POA: Diagnosis not present

## 2020-10-01 DIAGNOSIS — J449 Chronic obstructive pulmonary disease, unspecified: Secondary | ICD-10-CM | POA: Diagnosis not present

## 2020-10-01 DIAGNOSIS — R7989 Other specified abnormal findings of blood chemistry: Secondary | ICD-10-CM | POA: Diagnosis not present

## 2020-10-01 DIAGNOSIS — I739 Peripheral vascular disease, unspecified: Secondary | ICD-10-CM | POA: Diagnosis not present

## 2020-10-01 DIAGNOSIS — E785 Hyperlipidemia, unspecified: Secondary | ICD-10-CM | POA: Diagnosis not present

## 2020-10-01 DIAGNOSIS — M8580 Other specified disorders of bone density and structure, unspecified site: Secondary | ICD-10-CM | POA: Diagnosis not present

## 2020-10-01 DIAGNOSIS — E559 Vitamin D deficiency, unspecified: Secondary | ICD-10-CM | POA: Diagnosis not present

## 2020-11-04 DIAGNOSIS — J4550 Severe persistent asthma, uncomplicated: Secondary | ICD-10-CM | POA: Diagnosis not present

## 2020-11-05 ENCOUNTER — Ambulatory Visit (INDEPENDENT_AMBULATORY_CARE_PROVIDER_SITE_OTHER): Payer: Medicare Other | Admitting: *Deleted

## 2020-11-05 ENCOUNTER — Other Ambulatory Visit: Payer: Self-pay

## 2020-11-05 DIAGNOSIS — J4550 Severe persistent asthma, uncomplicated: Secondary | ICD-10-CM

## 2020-12-04 ENCOUNTER — Ambulatory Visit: Payer: Medicare Other | Admitting: Allergy

## 2020-12-19 DIAGNOSIS — M6281 Muscle weakness (generalized): Secondary | ICD-10-CM | POA: Diagnosis not present

## 2020-12-19 DIAGNOSIS — R2681 Unsteadiness on feet: Secondary | ICD-10-CM | POA: Diagnosis not present

## 2020-12-19 DIAGNOSIS — H8113 Benign paroxysmal vertigo, bilateral: Secondary | ICD-10-CM | POA: Diagnosis not present

## 2020-12-23 DIAGNOSIS — H8113 Benign paroxysmal vertigo, bilateral: Secondary | ICD-10-CM | POA: Diagnosis not present

## 2020-12-23 DIAGNOSIS — R2681 Unsteadiness on feet: Secondary | ICD-10-CM | POA: Diagnosis not present

## 2020-12-23 DIAGNOSIS — M6281 Muscle weakness (generalized): Secondary | ICD-10-CM | POA: Diagnosis not present

## 2020-12-26 DIAGNOSIS — H8113 Benign paroxysmal vertigo, bilateral: Secondary | ICD-10-CM | POA: Diagnosis not present

## 2020-12-26 DIAGNOSIS — M6281 Muscle weakness (generalized): Secondary | ICD-10-CM | POA: Diagnosis not present

## 2020-12-26 DIAGNOSIS — R2681 Unsteadiness on feet: Secondary | ICD-10-CM | POA: Diagnosis not present

## 2020-12-30 DIAGNOSIS — J4550 Severe persistent asthma, uncomplicated: Secondary | ICD-10-CM | POA: Diagnosis not present

## 2020-12-31 ENCOUNTER — Ambulatory Visit (INDEPENDENT_AMBULATORY_CARE_PROVIDER_SITE_OTHER): Payer: Medicare Other | Admitting: *Deleted

## 2020-12-31 ENCOUNTER — Other Ambulatory Visit: Payer: Self-pay

## 2020-12-31 DIAGNOSIS — J4550 Severe persistent asthma, uncomplicated: Secondary | ICD-10-CM

## 2021-01-01 DIAGNOSIS — H8113 Benign paroxysmal vertigo, bilateral: Secondary | ICD-10-CM | POA: Diagnosis not present

## 2021-01-01 DIAGNOSIS — M6281 Muscle weakness (generalized): Secondary | ICD-10-CM | POA: Diagnosis not present

## 2021-01-01 DIAGNOSIS — R2681 Unsteadiness on feet: Secondary | ICD-10-CM | POA: Diagnosis not present

## 2021-01-06 DIAGNOSIS — E785 Hyperlipidemia, unspecified: Secondary | ICD-10-CM | POA: Diagnosis not present

## 2021-01-06 DIAGNOSIS — H8113 Benign paroxysmal vertigo, bilateral: Secondary | ICD-10-CM | POA: Diagnosis not present

## 2021-01-06 DIAGNOSIS — F329 Major depressive disorder, single episode, unspecified: Secondary | ICD-10-CM | POA: Diagnosis not present

## 2021-01-06 DIAGNOSIS — R2681 Unsteadiness on feet: Secondary | ICD-10-CM | POA: Diagnosis not present

## 2021-01-06 DIAGNOSIS — I10 Essential (primary) hypertension: Secondary | ICD-10-CM | POA: Diagnosis not present

## 2021-01-06 DIAGNOSIS — M6281 Muscle weakness (generalized): Secondary | ICD-10-CM | POA: Diagnosis not present

## 2021-01-06 DIAGNOSIS — I739 Peripheral vascular disease, unspecified: Secondary | ICD-10-CM | POA: Diagnosis not present

## 2021-01-09 IMAGING — CR DG CHEST 1V PORT
2 series · 2 of 2 positions shown · non-contrast
Comparison: July 06, 2017

CLINICAL DATA: 7Z9PT-CX positive

EXAM:
PORTABLE CHEST 1 VIEW

[AP (1 of 2)]
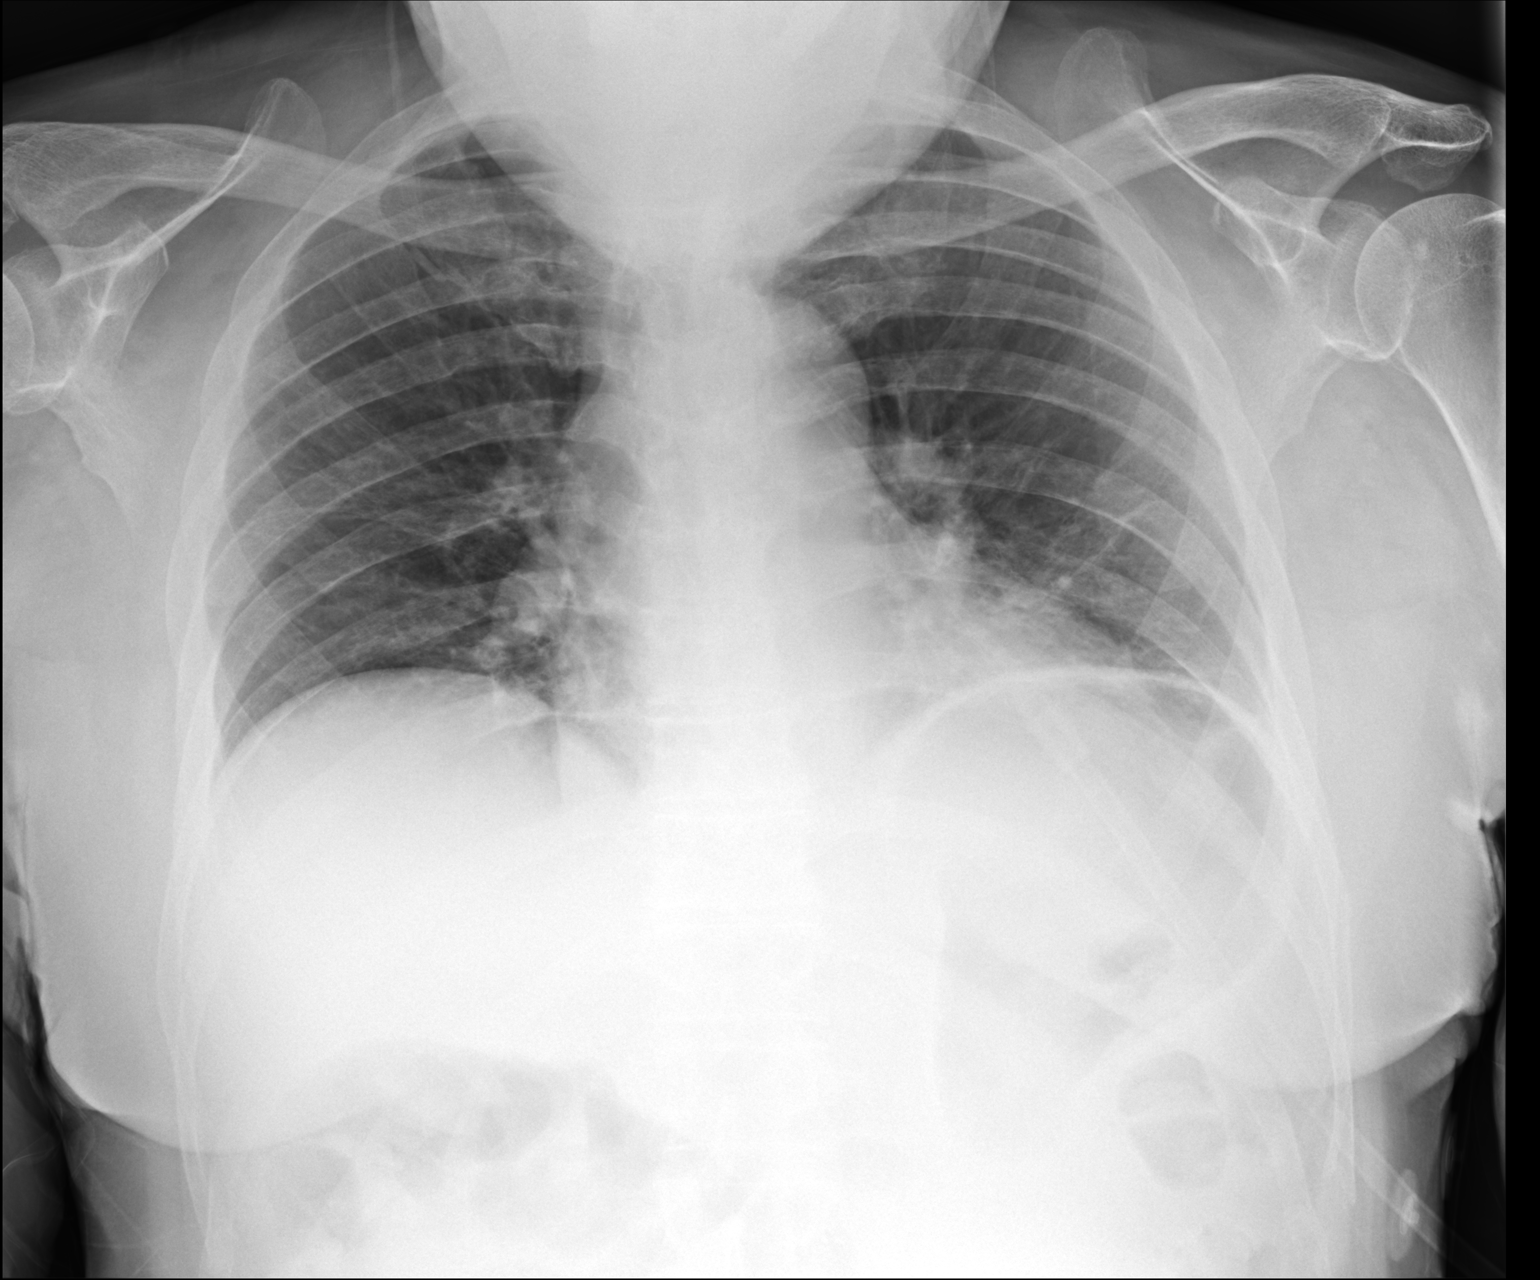

[AP (2 of 2)]
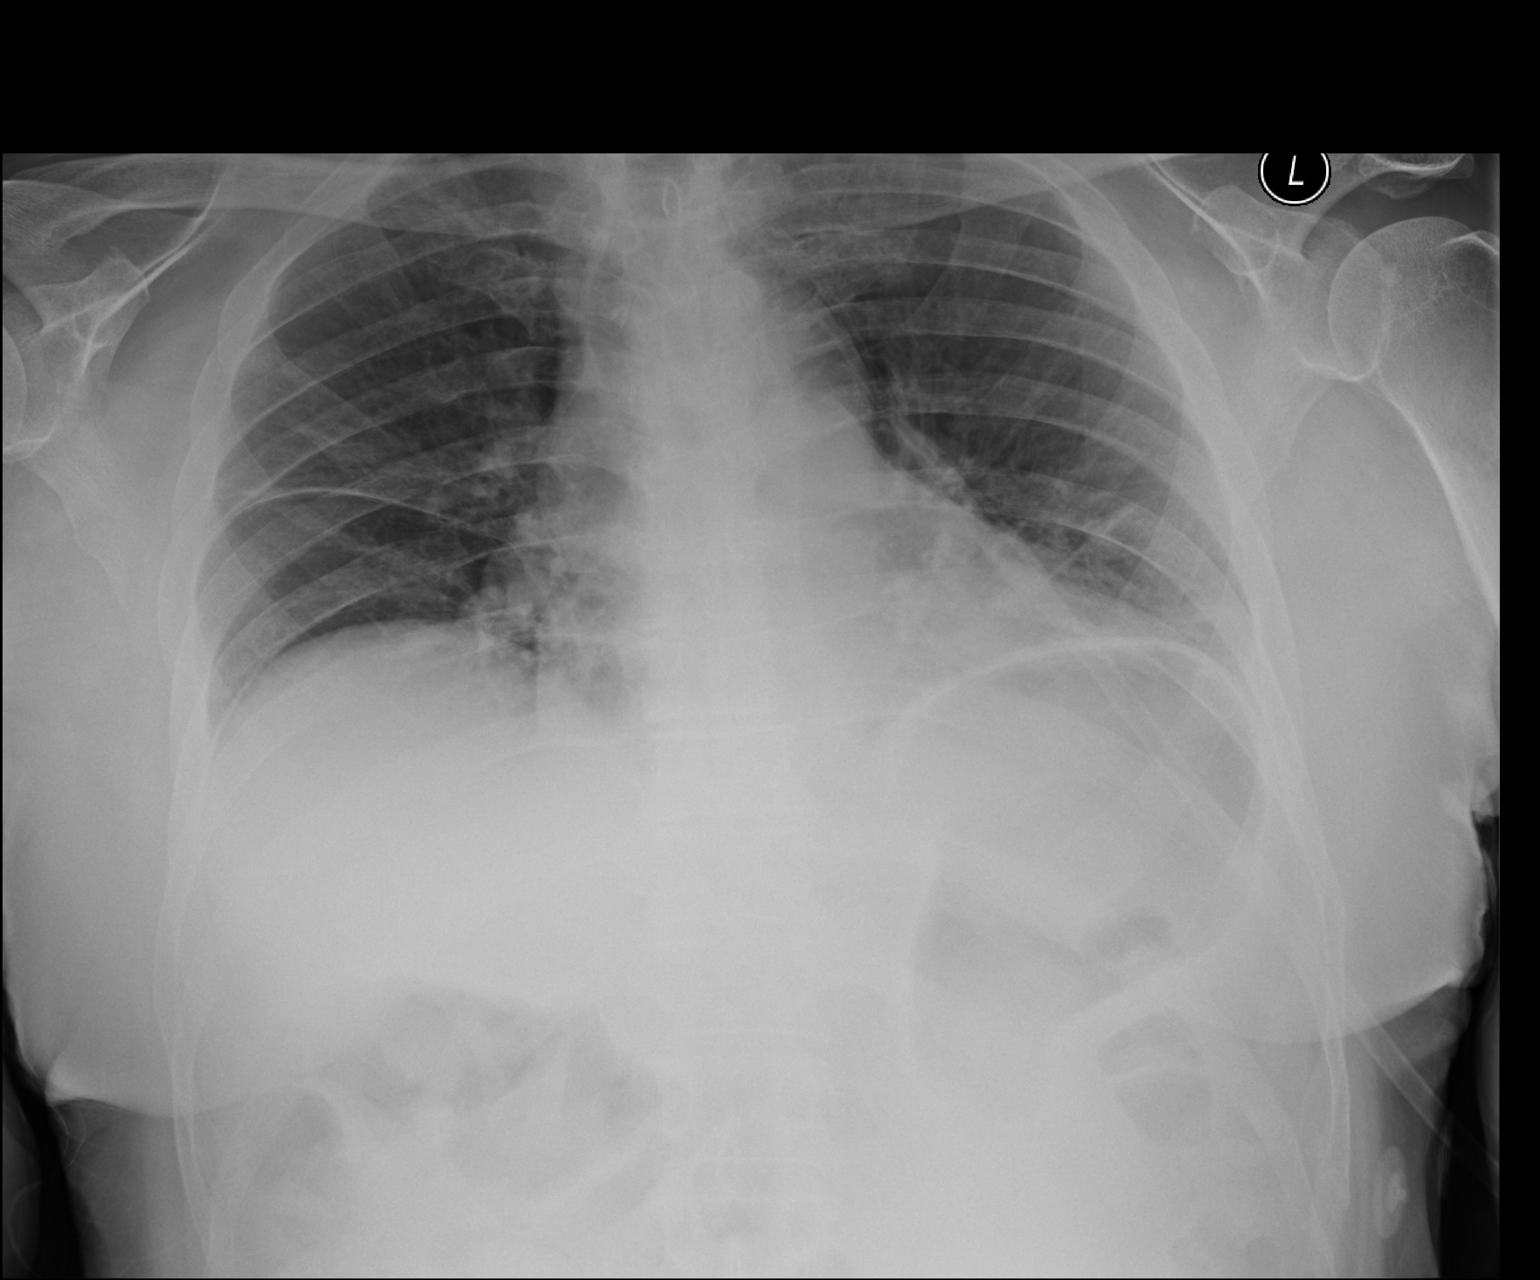

[2 of 2 positions shown; findings below may reference images not displayed]

FINDINGS: There is subtle ill-defined opacity in the right mid lung and left
base regions. Lungs elsewhere clear. Heart size and pulmonary
vascularity are normal. No adenopathy. No bone lesions.
IMPRESSION: Subtle airspace opacity right mid lung and left base, likely
representing foci of atypical organism pneumonia. No consolidation.
Lungs otherwise clear. Cardiac silhouette normal. No adenopathy.

## 2021-01-27 ENCOUNTER — Other Ambulatory Visit: Payer: Self-pay

## 2021-01-27 ENCOUNTER — Ambulatory Visit (INDEPENDENT_AMBULATORY_CARE_PROVIDER_SITE_OTHER): Payer: Medicare Other | Admitting: Allergy

## 2021-01-27 ENCOUNTER — Encounter: Payer: Self-pay | Admitting: Allergy

## 2021-01-27 VITALS — HR 66 | Temp 98.2°F | Resp 16 | Ht 66.0 in | Wt 192.8 lb

## 2021-01-27 DIAGNOSIS — J209 Acute bronchitis, unspecified: Secondary | ICD-10-CM

## 2021-01-27 DIAGNOSIS — J449 Chronic obstructive pulmonary disease, unspecified: Secondary | ICD-10-CM

## 2021-01-27 DIAGNOSIS — J302 Other seasonal allergic rhinitis: Secondary | ICD-10-CM | POA: Diagnosis not present

## 2021-01-27 DIAGNOSIS — K219 Gastro-esophageal reflux disease without esophagitis: Secondary | ICD-10-CM

## 2021-01-27 DIAGNOSIS — J3089 Other allergic rhinitis: Secondary | ICD-10-CM

## 2021-01-27 MED ORDER — BUDESONIDE 0.5 MG/2ML IN SUSP: 0.5000 mg | Freq: Two times a day (BID) | RESPIRATORY_TRACT | 3 refills | Status: DC

## 2021-01-27 MED ORDER — AZITHROMYCIN 250 MG PO TABS: ORAL_TABLET | ORAL | 0 refills | Status: DC

## 2021-01-27 NOTE — Progress Notes (Signed)
Follow Up Note  RE: Douglas Hansen MRN: [id number] DOB: 1945-03-31 Date of Office Visit: 01/27/2021  Referring provider: Janie Morning, DO Primary care provider: Janie Morning, DO  Chief Complaint: Asthma (Coughing and wheezing with shortness of breath uses his inhaler twice a day. ) and Allergic Rhinitis  (Clear mucus uses mucinex at night, but wakes up with a runny nose for 2 months now )  History of Present Illness: I had the pleasure of seeing Douglas Hansen for a follow up visit at the [facility] [place] on 01/27/2021. He is a 75 y.o. male, who is being followed for asthma/COPD overlap syndrome on Fasenra and allergic rhinitis. His previous allergy office visit was on 08/28/2020 with Dr. Maudie Hansen. Today is a regular follow up visit.  Asthma-COPD overlap syndrome  Noticed some difficulty breathing and wheezing at night mainly. Currently on Breztri 2 puffs BID and questionable if taking Alvesco or albuterol 2-3 times per day. Still getting [name] with unknown benefit.  Taking mucinex daily for the past 3-4 months and has some discolored mucous. Took amoxicillin a few months ago with some benefit.  No prednisone since the last visit.  Still taking montelukast.   Seasonal and perennial allergic rhinitis Using Flonase 1 spray per nostril once a day and azelastine spray TID prn with some benefit. No nosebleeds.  Not taking any antihistamines.   Assessment and Plan: Douglas Hansen is a 75 y.o. male with: Asthma-COPD overlap syndrome ([place]) Past history - Usually has issues with his breathing twice a year - in the spring and fall. Follows with cardiology and apparently also with pulmonology. Ex-smoker. Nucala not covered.  Interim history - still confusion about which inhalers he is using. Still having wheezing and trouble breathing. Coughing up yellow/green phlegm. Had amoxicillin with some benefit.  Today's spirometry showed restriction - worse from previous one with no improvement in  FEV1 post bronchodilator treatment. Clinically feeling slightly improved.  Please bring in all your medications to the next visit. Take azithromycin 2 tablets on day 1, then 1 tablet daily. Daily controller medication(s): Breztri 2 puffs twice a day with spacer and rinse mouth afterwards. Start Pulmicort 0.5mg  nebulizer twice a day. Continue with Singulair 10mg  daily at night.  Continue Fasenra injections. Will start PA for [employer] to see if insurance will cover this biologic. May use albuterol rescue inhaler 2 puffs every 4 to 6 hours as needed for shortness of breath, chest tightness, coughing, and wheezing. May use albuterol rescue inhaler 2 puffs 5 to 15 minutes prior to strenuous physical activities. Monitor frequency of use. Get spirometry at next visit.  Seasonal and perennial allergic rhinitis Past history - Some mild rhino conjunctivitis symptoms. 2021 bloodwork was positive to dust mites, grass pollen, tree pollen and ragweed pollen. Interim history - stable. Continue appropriate allergen avoidance measures Continue montelukast 10 mg daily at bedtime. May use over the counter antihistamines such as Zyrtec (cetirizine), Claritin (loratadine), Allegra (fexofenadine), or Xyzal (levocetirizine) daily as needed. May use azelastine nasal spray 1-2 sprays per nostril twice a day as needed for drainage May use fluticasone nasal spray, ONE spray per nostril twice a day.  Nasal saline lavage (NeilMed) has been recommended as needed and prior to medicated nasal sprays along with instructions for proper administration.    Gastroesophageal reflux disease without esophagitis Stable. Continue appropriate reflux list on modifications and omeprazole as prescribed.  Return in about 2 months (around 03/29/2021).  Meds ordered this encounter  Medications   budesonide (PULMICORT)  0.5 MG/2ML nebulizer solution    Sig: Take 2 mLs (0.5 mg total) by nebulization in the Hansen and at bedtime.     Dispense:  120 mL    Refill:  3   azithromycin (ZITHROMAX Z-PAK) 250 MG tablet    Sig: 2 tablets on day 1, 1 tablet daily afterwards.    Dispense:  6 each    Refill:  0    Lab Orders  No laboratory test(s) ordered today    Diagnostics: Spirometry:  Tracings reviewed. His effort: Good reproducible efforts. FVC: 1.14L FEV1: 0.89L, 39% predicted FEV1/FVC ratio: 78% Interpretation: Spirometry consistent with restrictive disease with no improvement in FEV1 post bronchodilator treatment. Clinically feeling slightly improved.  Worse than prior.   Please see scanned spirometry results for details.  Medication List:  Current Outpatient Medications  Medication Sig Dispense Refill   albuterol (VENTOLIN HFA) 108 (90 Base) MCG/ACT inhaler INHALE 2 PUFFS BY MOUTH EVERY 4 HOURS AS NEEDED FOR WHEEZING OR SHORTNESS OF BREATH 42.5 g 0   amLODipine (NORVASC) 10 MG tablet Take 10 mg by mouth daily.     amLODipine-olmesartan (AZOR) 10-40 MG tablet Take 1 tablet by mouth daily.     ascorbic acid (VITAMIN C) 500 MG tablet Take 1,000 mg by mouth daily.      aspirin EC 325 MG tablet Take 325 mg by mouth daily.      azithromycin (ZITHROMAX Z-PAK) 250 MG tablet 2 tablets on day 1, 1 tablet daily afterwards. 6 each 0   Budeson-Glycopyrrol-Formoterol (BREZTRI AEROSPHERE) 160-9-4.8 MCG/ACT AERO Inhale 2 puffs into the lungs in the Hansen and at bedtime. with spacer and rinse mouth afterwards. 10.7 g 5   budesonide (PULMICORT) 0.5 MG/2ML nebulizer solution Take 2 mLs (0.5 mg total) by nebulization in the Hansen and at bedtime. 120 mL 3   calcipotriene (DOVONOX) 0.005 % cream Apply topically as needed.     carvedilol (COREG) 12.5 MG tablet Take 6.25 mg by mouth 2 (two) times daily with a meal.      Cellulose Carmellose Sodium POWD Apply 1 application topically daily as needed (irritation). Apply to feet     chlorhexidine (PERIDEX) 0.12 % solution Take by mouth.     Cholecalciferol (VITAMIN D-3 PO) Take 1  capsule by mouth daily.     clobetasol ointment (TEMOVATE) 6.65 % Apply 1 application topically 2 (two) times daily as needed (to affected area).      Diclofenac Sodium 1.5 % SOLN Apply 4 drops topically 4 (four) times daily as needed (as directed- to affected area).   0   EPINEPHrine 0.3 mg/0.3 mL IJ SOAJ injection Inject 0.3 mLs (0.3 mg total) into the muscle as needed for anaphylaxis. 2 each 1   EYLEA 2 MG/0.05ML SOLN Place into the left eye every 30 (thirty) days. INJECTION     Fluocinolone Acetonide Body 0.01 % OIL Apply 1 application topically See admin instructions. Apply a small amount to affected area daily     fluticasone (FLONASE) 50 MCG/ACT nasal spray Place 1 spray into both nostrils daily. 16 g 0   furosemide (LASIX) 20 MG tablet Take 20 mg by mouth daily.     guaifenesin (HUMIBID E) 400 MG TABS tablet Take 1 tablet by mouth 2 (two) times daily.     ketoconazole (NIZORAL) 2 % shampoo Apply 1 application topically 3 (three) times a week.     metFORMIN (GLUCOPHAGE) 500 MG tablet Take 250 mg by mouth 2 (two) times daily as needed (for  elevated BGL).     montelukast (SINGULAIR) 10 MG tablet Take 1 tablet (10 mg total) by mouth at bedtime. 30 tablet 5   Multiple Vitamins-Minerals (MULTIVITAMIN & MINERAL PO) Take 1 tablet by mouth daily. Reported on 3/87/5643     OneTouch Delica Lancets 32R MISC daily.     ONETOUCH ULTRA test strip as needed.      Respiratory Therapy Supplies (FLUTTER) DEVI Use as directed 1 each 0   sertraline (ZOLOFT) 100 MG tablet Take 100 mg by mouth daily.     simethicone (MYLICON) 80 MG chewable tablet Chew 80 mg by mouth daily.     Current Facility-Administered Medications  Medication Dose Route Frequency Provider Last Rate Last Admin   Benralizumab SOSY 30 mg  30 mg Subcutaneous Q8 Weeks Rexene Alberts M, DO   30 mg at 12/31/20 1024   Allergies: Allergies  Allergen Reactions   Codeine Nausea Only and Other (See Comments)    GI upset   Lipitor [Atorvastatin]  Other (See Comments)    "Made my stomach burn"   Pravachol [Pravastatin Sodium] Other (See Comments)    Caused joint pain   I reviewed his past medical history, social history, family history, and environmental history and no significant changes have been reported from his previous visit.  Review of Systems  Constitutional:  Negative for appetite change, chills, fever and unexpected weight change.  HENT:  Positive for congestion. Negative for rhinorrhea.   Eyes:  Negative for itching.  Respiratory:  Positive for cough, shortness of breath and wheezing. Negative for chest tightness.   Gastrointestinal:  Negative for abdominal pain.  Skin:  Negative for rash.  Allergic/Immunologic: Positive for environmental allergies.  Neurological:  Negative for headaches.   Objective: Pulse 66   Temp 98.2 F (36.8 C)   Resp 16   Ht 5\' 6"  (1.676 m)   Wt 192 lb 12.8 oz (87.5 kg)   SpO2 96%   BMI 31.12 kg/m  Body mass index is 31.12 kg/m. Physical Exam Vitals and nursing note reviewed.  Constitutional:      Appearance: Normal appearance. He is well-developed.  HENT:     Head: Normocephalic and atraumatic.     Right Ear: Tympanic membrane and external ear normal.     Left Ear: Tympanic membrane and external ear normal.     Nose: Nose normal.     Mouth/Throat:     Mouth: Mucous membranes are moist.     Pharynx: Oropharynx is clear.  Eyes:     Conjunctiva/sclera: Conjunctivae normal.  Cardiovascular:     Rate and Rhythm: Normal rate and regular rhythm.     Heart sounds: Normal heart sounds. No murmur heard. Pulmonary:     Effort: Pulmonary effort is normal.     Breath sounds: Normal breath sounds. No wheezing, rhonchi or rales.  Musculoskeletal:     Cervical back: Neck supple.  Skin:    General: Skin is warm.     Findings: No rash.  Neurological:     Mental Status: He is alert and oriented to person, place, and time.  Psychiatric:        Behavior: Behavior normal.  Previous notes  and tests were reviewed. The plan was reviewed with the patient/family, and all questions/concerned were addressed.  It was my pleasure to see Devell today and participate in his care. Please feel free to contact me with any questions or concerns.  Sincerely,  Rexene Alberts, DO Allergy & Immunology  [facility] office: [place] office: [phone]

## 2021-01-27 NOTE — Patient Instructions (Addendum)
Please bring in all your medications to the next visit.  Bronchitis Take azithromycin 2 tablets on day 1, then 1 tablet daily.  COPD/asthma Daily controller medication(s):  Use Breztri 2 puffs twice a day with spacer and rinse mouth afterwards. Start Pulmicort 0.5mg  nebulizer twice a day.  Continue with Singulair 10mg  daily at night.  Continue Fasenra injections.  Read about Kathrin [place] - every 4 weeks injections, handout given.  May use albuterol rescue inhaler 2 puffs every 4 to 6 hours as needed for shortness of breath, chest tightness, coughing, and wheezing. May use albuterol rescue inhaler 2 puffs 5 to 15 minutes prior to strenuous physical activities. Monitor frequency of use.  Asthma control goals:  Full participation in all desired activities (may need albuterol before activity) Albuterol use two times or less a week on average (not counting use with activity) Cough interfering with sleep two times or less a month Oral steroids no more than once a year No hospitalizations  Other allergic rhinitis Allergy testing was positive to: dust mites, grass pollen, tree pollen and ragweed pollen. Continue appropriate allergen avoidance measures Continue montelukast 10 mg daily at bedtime. May use over the counter antihistamines such as Zyrtec (cetirizine), Claritin (loratadine), Allegra (fexofenadine), or Xyzal (levocetirizine) daily as needed.  May use azelastine nasal spray 1-2 sprays per nostril twice a day as needed for drainage May use fluticasone nasal spray, ONE spray per nostril twice a day.  Nasal saline lavage (NeilMed) has been recommended as needed and prior to medicated nasal sprays along with instructions for proper administration.    Gastroesophageal reflux disease without esophagitis Continue omeprazole as prescribed.  Follow up in 2 months or sooner if needed.   Control of House Dust Mite Allergen Dust mite allergens are a common trigger of allergy and asthma symptoms.  While they can be found throughout the house, these microscopic creatures thrive in warm, humid environments such as bedding, upholstered furniture and carpeting. Because so much time is spent in the bedroom, it is essential to reduce mite levels there.  Encase pillows, mattresses, and box springs in special allergen-proof fabric covers or airtight, zippered plastic covers.  Bedding should be washed weekly in hot water (130 F) and dried in a hot dryer. Allergen-proof covers are available for comforters and pillows that can't be regularly washed.  Wash the allergy-proof covers every few months. Minimize clutter in the bedroom. Keep pets out of the bedroom.  Keep humidity less than 50% by using a dehumidifier or air conditioning. You can buy a humidity measuring device called a hygrometer to monitor this.  If possible, replace carpets with hardwood, linoleum, or washable area rugs. If that's not possible, vacuum frequently with a vacuum that has a HEPA filter. Remove all upholstered furniture and non-washable window drapes from the bedroom. Remove all non-washable stuffed toys from the bedroom.  Wash stuffed toys weekly. Reducing Pollen Exposure Pollen seasons: trees (spring), grass (summer) and ragweed/weeds (fall). Keep windows closed in your home and car to lower pollen exposure.  Install air conditioning in the bedroom and throughout the house if possible.  Avoid going out in dry windy days - especially early morning. Pollen counts are highest between 5 - 10 AM and on dry, hot and windy days.  Save outside activities for late afternoon or after a heavy rain, when pollen levels are lower.  Avoid mowing of grass if you have grass pollen allergy. Be aware that pollen can also be transported indoors on people and pets.  Dry your clothes in an automatic dryer rather than hanging them outside where they might collect pollen.  Rinse hair and eyes before bedtime.

## 2021-01-27 NOTE — Assessment & Plan Note (Signed)
Past history - Some mild rhino conjunctivitis symptoms. 2021 bloodwork was positive to dust mites, grass pollen, tree pollen and ragweed pollen. Interim history - stable.  Continue appropriate allergen avoidance measures  Continue montelukast 10 mg daily at bedtime.  May use over the counter antihistamines such as Zyrtec (cetirizine), Claritin (loratadine), Allegra (fexofenadine), or Xyzal (levocetirizine) daily as needed.  May use azelastine nasal spray 1-2 sprays per nostril twice a day as needed for drainage  May use fluticasone nasal spray, ONE spray per nostril twice a day.   Nasal saline lavage (NeilMed) has been recommended as needed and prior to medicated nasal sprays along with instructions for proper administration.

## 2021-01-27 NOTE — Assessment & Plan Note (Signed)
Stable.  Continue appropriate reflux list on modifications and omeprazole as prescribed.

## 2021-01-27 NOTE — Assessment & Plan Note (Signed)
Past history - Usually has issues with his breathing twice a year - in the spring and fall. Follows with cardiology and apparently also with pulmonology. Ex-smoker. Nucala not covered.  Interim history - still confusion about which inhalers he is using. Still having wheezing and trouble breathing. Coughing up yellow/green phlegm. Had amoxicillin with some benefit.   Today's spirometry showed restriction - worse from previous one with no improvement in FEV1 post bronchodilator treatment. Clinically feeling slightly improved.   Please bring in all your medications to the next visit. . Take azithromycin 2 tablets on day 1, then 1 tablet daily.  Daily controller medication(s): Breztri 2 puffs twice a day with spacer and rinse mouth afterwards.  Start Pulmicort 0.5mg  nebulizer twice a day.  Continue with Singulair 10mg  daily at night.  . Continue Fasenra injections.  Will start PA for Tezspire to see if insurance will cover this biologic. . May use albuterol rescue inhaler 2 puffs every 4 to 6 hours as needed for shortness of breath, chest tightness, coughing, and wheezing. May use albuterol rescue inhaler 2 puffs 5 to 15 minutes prior to strenuous physical activities. Monitor frequency of use. . Get spirometry at next visit.

## 2021-02-05 DIAGNOSIS — E785 Hyperlipidemia, unspecified: Secondary | ICD-10-CM | POA: Diagnosis not present

## 2021-02-05 DIAGNOSIS — I10 Essential (primary) hypertension: Secondary | ICD-10-CM | POA: Diagnosis not present

## 2021-02-05 DIAGNOSIS — I739 Peripheral vascular disease, unspecified: Secondary | ICD-10-CM | POA: Diagnosis not present

## 2021-02-05 DIAGNOSIS — F329 Major depressive disorder, single episode, unspecified: Secondary | ICD-10-CM | POA: Diagnosis not present

## 2021-02-07 DIAGNOSIS — I10 Essential (primary) hypertension: Secondary | ICD-10-CM | POA: Diagnosis not present

## 2021-02-07 DIAGNOSIS — R350 Frequency of micturition: Secondary | ICD-10-CM | POA: Diagnosis not present

## 2021-02-07 DIAGNOSIS — M25531 Pain in right wrist: Secondary | ICD-10-CM | POA: Diagnosis not present

## 2021-02-10 ENCOUNTER — Telehealth: Payer: Self-pay | Admitting: *Deleted

## 2021-02-10 NOTE — Telephone Encounter (Signed)
Noted  

## 2021-02-10 NOTE — Telephone Encounter (Signed)
-----   Message from Garnet Sierras, DO sent at 01/27/2021  1:07 PM EST ----- Please start PA for Tezspire. Fasenra not effective. Thank you.

## 2021-02-10 NOTE — Telephone Encounter (Signed)
Called patient and advised change from [place] to [place] and will be same buy and bill as previous therapy, Appt scheduled for 12/8 to start

## 2021-02-12 DIAGNOSIS — J4550 Severe persistent asthma, uncomplicated: Secondary | ICD-10-CM | POA: Diagnosis not present

## 2021-02-13 ENCOUNTER — Other Ambulatory Visit: Payer: Self-pay

## 2021-02-13 ENCOUNTER — Ambulatory Visit (INDEPENDENT_AMBULATORY_CARE_PROVIDER_SITE_OTHER): Payer: Medicare Other

## 2021-02-13 DIAGNOSIS — J4550 Severe persistent asthma, uncomplicated: Secondary | ICD-10-CM

## 2021-02-13 MED ORDER — TEZEPELUMAB-EKKO 210 MG/1.91ML ~~LOC~~ SOSY
210.0000 mg | PREFILLED_SYRINGE | SUBCUTANEOUS | Status: AC
Start: 2021-02-13 — End: ?
  Administered 2021-02-13 – 2021-08-05 (×7): 210 mg via SUBCUTANEOUS

## 2021-02-13 NOTE — Progress Notes (Signed)
Immunotherapy   Patient Details  Name: Douglas Hansen MRN: [id number] Date of Birth: 19-Mar-1945  02/13/2021  [name] here to start Tezspire for Asthma. Patient  switched from [place] to [place]. Patient waited 30 minutes with no problems. Frequency: every 4 weeks Consent signed and patient instructions given.   Herbie Drape 02/13/2021, 3:05 PM

## 2021-02-20 IMAGING — CT CT HEART MORP W/ CTA COR W/ SCORE W/ CA W/CM &/OR W/O CM
4 of 7 series · 8 of 20 positions shown, 9 images · IV contrast (APPLIED)
Comparison: None.
COMPARISON: None.

Addendum:
EXAM:
OVER-READ INTERPRETATION  CT CHEST

The following report is an over-read performed by radiologist Dr.
Mt Herman [REDACTED] on 01/03/2020. This
over-read does not include interpretation of cardiac or coronary
anatomy or pathology. The coronary calcium score/coronary CTA
interpretation by the cardiologist is attached.
CLINICAL DATA: 74-year-old male with medical history significant of
HTN, IIDM, hyperlipidemia, severe asthma, recent OBQF3-UC pneumonia
who presented with new onset of chest pain
Cardiac/Coronary  CTA
TECHNIQUE: The patient was scanned on a [employer] Force scanner.

[Series 6: best diast 72 % · axial · 0.39mm/px · z∈[+1117,+1148]mm · 2 of 239 slices shown, 3 images]
[im 80/239  vessel]
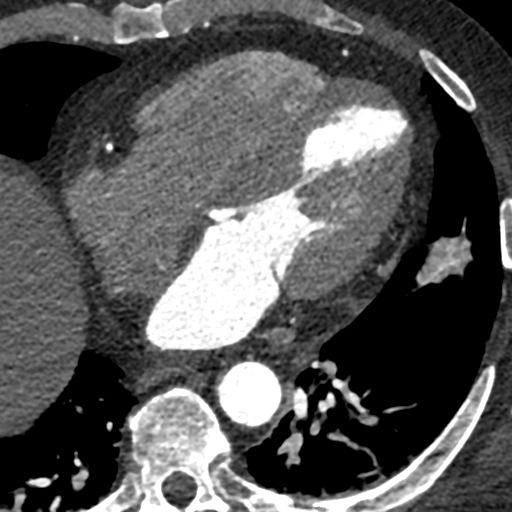
[im 80/239  lung]
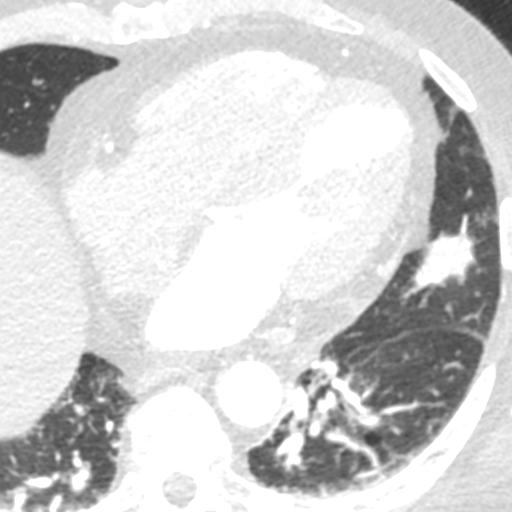
[im 159/239  vessel]
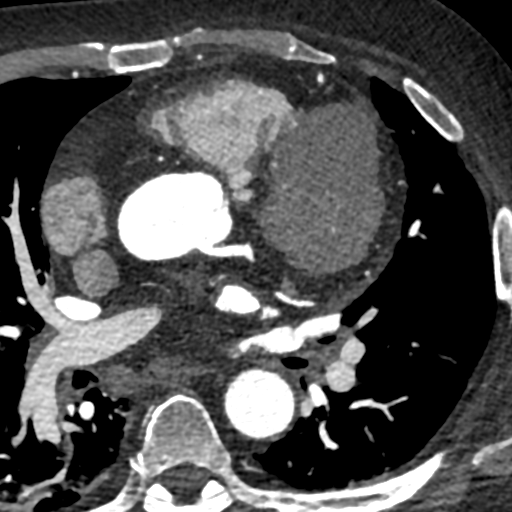

[Series 7: best syst 33 % · axial · 0.39mm/px · z∈[+1117,+1148]mm · 2 of 239 slices shown]
[im 80/239  vessel]
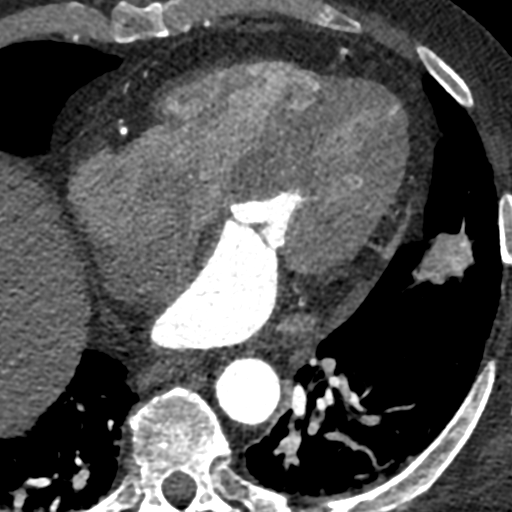
[im 159/239  vessel]
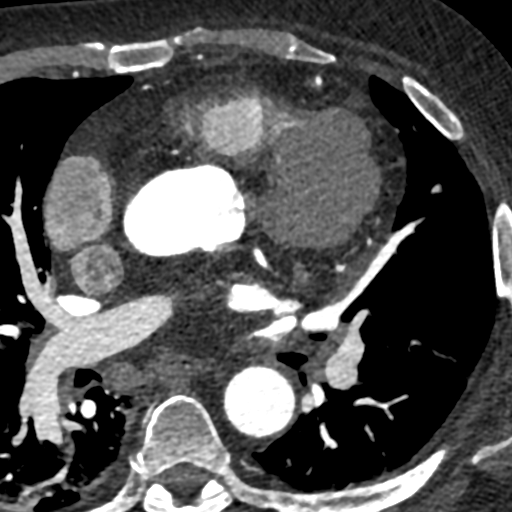

[Series 8: ts diast sharp 72 % · axial · 0.39mm/px · z∈[+1117,+1148]mm · 2 of 239 slices shown]
[im 80/239  lung]
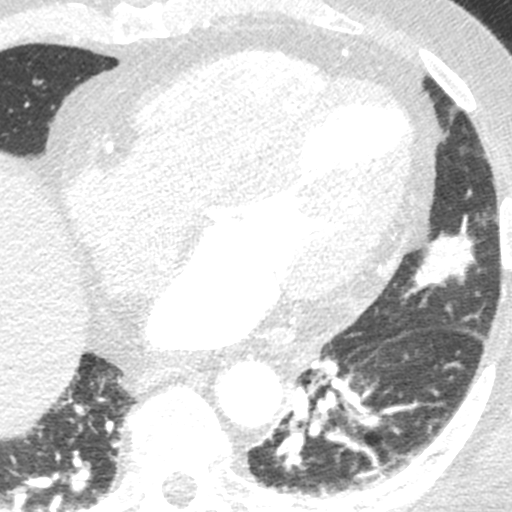
[im 159/239  lung]
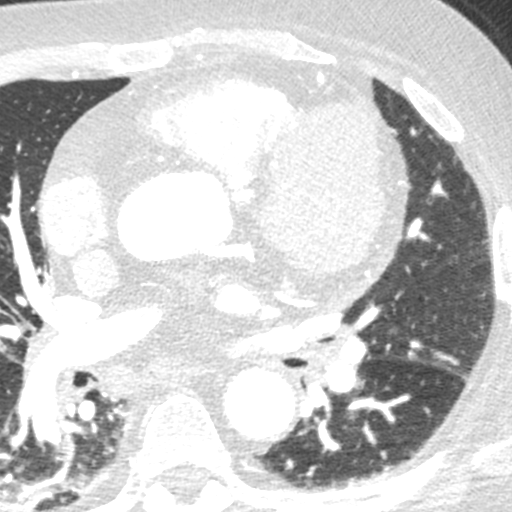

[Series 9: ts syst sharp 33 % · axial · 0.39mm/px · z∈[+1117,+1148]mm · 2 of 239 slices shown]
[im 80/239  lung]
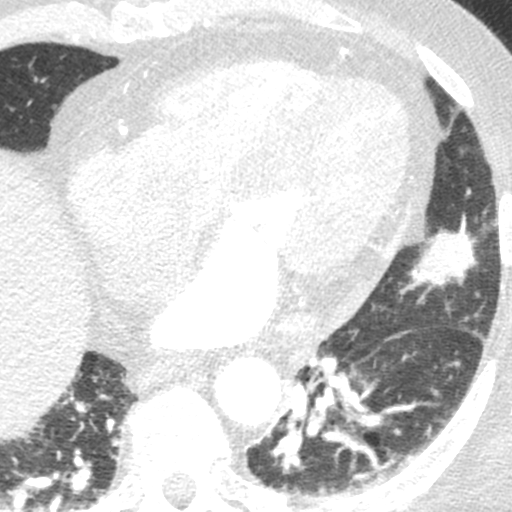
[im 159/239  lung]
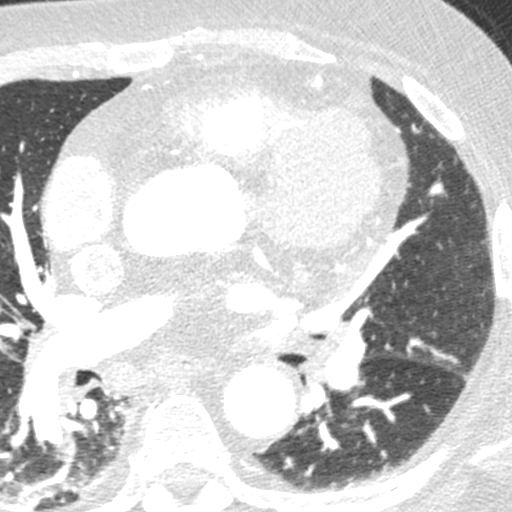

[8 of 20 positions shown; findings below may reference images not displayed]

FINDINGS: Aortic atherosclerosis. Dilatation of the pulmonic trunk (3.6 cm in
diameter). Within the visualized portions of the thorax there are no
suspicious appearing pulmonary nodules or masses, there is no acute
consolidative airspace disease, no pleural effusions, no
pneumothorax and no lymphadenopathy. Visualized portions of the
upper abdomen are unremarkable. There are no aggressive appearing
lytic or blastic lesions noted in the visualized portions of the
skeleton.
IMPRESSION: 1. Dilatation of the pulmonic trunk (3.6 cm in diameter), concerning
for pulmonary arterial hypertension.
2.  Aortic Atherosclerosis (NXVM9-FFP.P).
FINDINGS: A 100 kV prospective scan was triggered in the descending thoracic
aorta at 111 HU's. Axial non-contrast 3 mm slices were carried out
through the heart. The data set was analyzed on a dedicated work
station and scored using the Agatson method. Gantry rotation speed
was 250 msecs and collimation was .6 mm. 50 mg of PO Metoprolol and
0.8 mg of sl NTG was given. The 3D data set was reconstructed in 5%
intervals of the 67-82 % of the R-R cycle. Diastolic phases were
analyzed on a dedicated work station using MPR, MIP and VRT modes.
The patient received 80 cc of contrast.

Aorta: Normal size. Mild diffuse atherosclerotic plaque and
calcifications. No dissection.

Aortic Valve:  Trileaflet.  No calcifications.

Coronary Arteries:  Normal coronary origin.  Right dominance.

RCA is a large dominant artery that gives rise to PDA and PLA. There
is moderate, mixed, predominantly calcified plaque in the proximal
RCA with a focal stenosis 50-69%. Mid and distal RCA and PDA, PLA
have only luminal irregularities.

Left main is a large artery that gives rise to LAD, ramus
intermedius and LCX arteries. Distal left main has minimal eccentric
calcified plaque with stenosis 0-25%.

LAD is a large vessel that gives rise to a medium sized diagonal
artery. Proximal LAD has mild calcified plaque with stenosis 25-49%.
Mid LAD has a mild focal calcified plaque with stenosis 25-49%.
Distal LAD has only luminal irregularities.

D1 has moderate calcified plaque in the proximal segment with
stenosis 50-69%.

LCX is a non-dominant artery that gives rise to one medium sized OM1
branch. There is only minimal plaque.

OM1 is a medium size artery with minimal irregularities.

Other findings:

Normal pulmonary vein drainage into the left atrium.

Normal left atrial appendage without a thrombus.
IMPRESSION: 1. Coronary calcium score of 209. This was 44 percentile for age and
sex matched control.

2. Normal coronary origin with right dominance.

3. CAD-RADS 3. Moderate stenosis in the proximal RCA and proximal
portion of 1. diagonal artery. Consider symptom-guided anti-ischemic
pharmacotherapy as well as risk factor modification per guideline
directed care. Additional analysis with CT FFR will be submitted.

4. Moderately dilated pulmonary artery with maximum diameter 36 mm
suspicious for pulmonary hypertension.

5.  PFO present.

*** End of Addendum ***
EXAM:
OVER-READ INTERPRETATION  CT CHEST

The following report is an over-read performed by radiologist Dr.
Mt Herman [REDACTED] on 01/03/2020. This
over-read does not include interpretation of cardiac or coronary
anatomy or pathology. The coronary calcium score/coronary CTA
interpretation by the cardiologist is attached.
FINDINGS: Aortic atherosclerosis. Dilatation of the pulmonic trunk (3.6 cm in
diameter). Within the visualized portions of the thorax there are no
suspicious appearing pulmonary nodules or masses, there is no acute
consolidative airspace disease, no pleural effusions, no
pneumothorax and no lymphadenopathy. Visualized portions of the
upper abdomen are unremarkable. There are no aggressive appearing
lytic or blastic lesions noted in the visualized portions of the
skeleton.
IMPRESSION: 1. Dilatation of the pulmonic trunk (3.6 cm in diameter), concerning
for pulmonary arterial hypertension.
2.  Aortic Atherosclerosis (NXVM9-FFP.P).

## 2021-02-24 DIAGNOSIS — R350 Frequency of micturition: Secondary | ICD-10-CM | POA: Diagnosis not present

## 2021-02-25 ENCOUNTER — Ambulatory Visit: Payer: Medicare Other

## 2021-03-07 DIAGNOSIS — M25531 Pain in right wrist: Secondary | ICD-10-CM | POA: Diagnosis not present

## 2021-03-07 DIAGNOSIS — M1811 Unilateral primary osteoarthritis of first carpometacarpal joint, right hand: Secondary | ICD-10-CM | POA: Diagnosis not present

## 2021-03-12 DIAGNOSIS — J4550 Severe persistent asthma, uncomplicated: Secondary | ICD-10-CM | POA: Diagnosis not present

## 2021-03-13 ENCOUNTER — Ambulatory Visit: Payer: Medicare Other

## 2021-03-13 ENCOUNTER — Ambulatory Visit (INDEPENDENT_AMBULATORY_CARE_PROVIDER_SITE_OTHER): Payer: Medicare Other

## 2021-03-13 ENCOUNTER — Other Ambulatory Visit: Payer: Self-pay

## 2021-03-13 DIAGNOSIS — J4550 Severe persistent asthma, uncomplicated: Secondary | ICD-10-CM

## 2021-04-03 DIAGNOSIS — E785 Hyperlipidemia, unspecified: Secondary | ICD-10-CM | POA: Diagnosis not present

## 2021-04-03 DIAGNOSIS — I10 Essential (primary) hypertension: Secondary | ICD-10-CM | POA: Diagnosis not present

## 2021-04-03 DIAGNOSIS — R7989 Other specified abnormal findings of blood chemistry: Secondary | ICD-10-CM | POA: Diagnosis not present

## 2021-04-03 DIAGNOSIS — I739 Peripheral vascular disease, unspecified: Secondary | ICD-10-CM | POA: Diagnosis not present

## 2021-04-03 DIAGNOSIS — M8580 Other specified disorders of bone density and structure, unspecified site: Secondary | ICD-10-CM | POA: Diagnosis not present

## 2021-04-03 DIAGNOSIS — E559 Vitamin D deficiency, unspecified: Secondary | ICD-10-CM | POA: Diagnosis not present

## 2021-04-03 DIAGNOSIS — Z125 Encounter for screening for malignant neoplasm of prostate: Secondary | ICD-10-CM | POA: Diagnosis not present

## 2021-04-03 DIAGNOSIS — E1151 Type 2 diabetes mellitus with diabetic peripheral angiopathy without gangrene: Secondary | ICD-10-CM | POA: Diagnosis not present

## 2021-04-10 DIAGNOSIS — I739 Peripheral vascular disease, unspecified: Secondary | ICD-10-CM | POA: Diagnosis not present

## 2021-04-10 DIAGNOSIS — Z0000 Encounter for general adult medical examination without abnormal findings: Secondary | ICD-10-CM | POA: Diagnosis not present

## 2021-04-10 DIAGNOSIS — E1151 Type 2 diabetes mellitus with diabetic peripheral angiopathy without gangrene: Secondary | ICD-10-CM | POA: Diagnosis not present

## 2021-04-10 DIAGNOSIS — I10 Essential (primary) hypertension: Secondary | ICD-10-CM | POA: Diagnosis not present

## 2021-04-10 DIAGNOSIS — J449 Chronic obstructive pulmonary disease, unspecified: Secondary | ICD-10-CM | POA: Diagnosis not present

## 2021-04-11 DIAGNOSIS — J4550 Severe persistent asthma, uncomplicated: Secondary | ICD-10-CM | POA: Diagnosis not present

## 2021-04-13 NOTE — Progress Notes (Signed)
Follow Up Note  RE: Douglas Hansen MRN: [id number] DOB: [birth year] Date of Office Visit: 04/14/2021  Referring provider: Janie Morning, DO Primary care provider: Janie Morning, DO  Chief Complaint: Follow-up  History of Present Illness: I had the pleasure of seeing Douglas Hansen for a follow up visit at the [facility] on 04/14/2021. He is a 76 y.o. male, who is being followed for asthma-COPD overlap syndrome on Tezspire, allergic rhinitis, GERD. His previous allergy office visit was on 01/27/2021 with Dr. Maudie Mercury. Today is a regular follow up visit.  Asthma-COPD overlap syndrome  Started Tezspire on 02/13/21 and he noticed improvement in his chest congestion and his breathing seems to be easier.   Still taking Breztri 2 puffs twice a day, Singulair 10mg  daily. Patient does not have a nebulizer machine at home.   Using albuterol every other day due to some nasal congestion which causes some difficulty breathing with some benefit.   Denies any ER/urgent care visits or prednisone use since the last visit.  Seasonal and perennial allergic rhinitis Taking Singulair 10mg  daily, Flonase 1 spray per nostril twice a day. No nosebleeds. Not taking antihistamines.  Having some rhinorrhea and nasal congestion.   Gastroesophageal reflux disease without esophagitis Stable with omeprazole.  Patient forgot to bring his meds as requested at the last OV.  Assessment and Plan: Douglas Hansen is a 76 y.o. male with: Asthma-COPD overlap syndrome (Douglas Hansen) Past history - Usually has issues with his breathing twice a year - in the spring and fall. Follows with cardiology and apparently also with pulmonology. Ex-smoker. Nucala not covered. On Fasenra before.  Interim history - started Tezspire on 02/13/21 and states it's helping him breathe better. He doesn't have a nebulizer at home so he was not able to start budesonide neb. Using albuterol a few times per week. No prednisone since the last visit.   Today's spirometry showed mixed obstruction and restriction. Please bring in all your medications to the next visit. Daily controller medication(s):  Use Breztri 2 puffs twice a day with spacer and rinse mouth afterwards. Continue with Singulair 10mg  daily at night.  Continue Tezspire injections every 4 weeks - given today.  Nebulizer machine given.  May use albuterol rescue inhaler 2 puffs or nebulizer every 4 to 6 hours as needed for shortness of breath, chest tightness, coughing, and wheezing. May use albuterol rescue inhaler 2 puffs 5 to 15 minutes prior to strenuous physical activities. Monitor frequency of use.  Get spirometry at next visit. If no improvement will add on Pulmicort neb next.   Seasonal and perennial allergic rhinitis Past history - Some mild rhino conjunctivitis symptoms. 2021 bloodwork was positive to dust mites, grass pollen, tree pollen and ragweed pollen. Interim history - only using Singulair and Flonase. Still has symptoms.  Continue appropriate allergen avoidance measures Continue montelukast 10 mg daily at bedtime. May use over the counter antihistamines such as Zyrtec (cetirizine), Claritin (loratadine), Allegra (fexofenadine), or Xyzal (levocetirizine) daily as needed. May use azelastine nasal spray 1-2 sprays per nostril twice a day as needed for drainage May use fluticasone nasal spray, one spray per nostril twice a day.  Nasal saline lavage (NeilMed) has been recommended as needed and prior to medicated nasal sprays along with instructions for proper administration.   Gastroesophageal reflux disease without esophagitis Stable. Continue appropriate reflux list on modifications and omeprazole as prescribed.  Return in about 2 months (around 06/12/2021).  Meds ordered this encounter  Medications   albuterol (  PROVENTIL) (2.5 MG/3ML) 0.083% nebulizer solution    Sig: Take 3 mLs (2.5 mg total) by nebulization every 4 (four) hours as needed for wheezing or  shortness of breath (coughing fits).    Dispense:  75 mL    Refill:  1   Budeson-Glycopyrrol-Formoterol (BREZTRI AEROSPHERE) 160-9-4.8 MCG/ACT AERO    Sig: Inhale 2 puffs into the lungs in the morning and at bedtime. with spacer and rinse mouth afterwards.    Dispense:  10.7 g    Refill:  5   azelastine (ASTELIN) 0.1 % nasal spray    Sig: Place 1-2 sprays into both nostrils 2 (two) times daily as needed (nasal drainage). Use in each nostril as directed    Dispense:  30 mL    Refill:  5   fluticasone (FLONASE) 50 MCG/ACT nasal spray    Sig: Place 1 spray into both nostrils 2 (two) times daily as needed (nasal congestion).    Dispense:  16 g    Refill:  5   Lab Orders  No laboratory test(s) ordered today    Diagnostics: Spirometry:  Tracings reviewed. His effort: It was hard to get consistent efforts and there is a question as to whether this reflects a maximal maneuver. FVC: 1.12L FEV1: 0.75L, 33% predicted FEV1/FVC ratio: 67% Interpretation: Spirometry consistent with mixed obstructive and restrictive disease.  Please see scanned spirometry results for details.  Medication List:  Current Outpatient Medications  Medication Sig Dispense Refill   albuterol (PROVENTIL) (2.5 MG/3ML) 0.083% nebulizer solution Take 3 mLs (2.5 mg total) by nebulization every 4 (four) hours as needed for wheezing or shortness of breath (coughing fits). 75 mL 1   albuterol (VENTOLIN HFA) 108 (90 Base) MCG/ACT inhaler INHALE 2 PUFFS BY MOUTH EVERY 4 HOURS AS NEEDED FOR WHEEZING OR SHORTNESS OF BREATH 42.5 g 0   amLODipine-olmesartan (AZOR) 10-40 MG tablet Take 1 tablet by mouth daily.     aspirin EC 325 MG tablet Take 325 mg by mouth daily.      azelastine (ASTELIN) 0.1 % nasal spray Place 1-2 sprays into both nostrils 2 (two) times daily as needed (nasal drainage). Use in each nostril as directed 30 mL 5   Budeson-Glycopyrrol-Formoterol (BREZTRI AEROSPHERE) 160-9-4.8 MCG/ACT AERO Inhale 2 puffs into the  lungs in the morning and at bedtime. with spacer and rinse mouth afterwards. 10.7 g 5   carvedilol (COREG) 12.5 MG tablet Take 6.25 mg by mouth 2 (two) times daily with a meal.      Cellulose Carmellose Sodium POWD Apply 1 application topically daily as needed (irritation). Apply to feet     chlorhexidine (PERIDEX) 0.12 % solution Take by mouth.     Cholecalciferol (VITAMIN D-3 PO) Take 1 capsule by mouth daily.     clobetasol ointment (TEMOVATE) 8.52 % Apply 1 application topically 2 (two) times daily as needed (to affected area).      Diclofenac Sodium 1.5 % SOLN Apply 4 drops topically 4 (four) times daily as needed (as directed- to affected area).   0   EPINEPHrine 0.3 mg/0.3 mL IJ SOAJ injection Inject 0.3 mLs (0.3 mg total) into the muscle as needed for anaphylaxis. 2 each 1   EYLEA 2 MG/0.05ML SOLN Place into the left eye every 30 (thirty) days. INJECTION     fluticasone (FLONASE) 50 MCG/ACT nasal spray Place 1 spray into both nostrils 2 (two) times daily as needed (nasal congestion). 16 g 5   amLODipine (NORVASC) 10 MG tablet Take 10 mg by  mouth daily. (Patient not taking: Reported on 04/14/2021)     ascorbic acid (VITAMIN C) 500 MG tablet Take 1,000 mg by mouth daily.      calcipotriene (DOVONOX) 0.005 % cream Apply topically as needed.     Fluocinolone Acetonide Body 0.01 % OIL Apply 1 application topically See admin instructions. Apply a small amount to affected area daily     furosemide (LASIX) 20 MG tablet Take 20 mg by mouth daily.     ketoconazole (NIZORAL) 2 % shampoo Apply 1 application topically 3 (three) times a week.     metFORMIN (GLUCOPHAGE) 500 MG tablet Take 250 mg by mouth 2 (two) times daily as needed (for elevated BGL).     montelukast (SINGULAIR) 10 MG tablet Take 1 tablet (10 mg total) by mouth at bedtime. 30 tablet 5   Multiple Vitamins-Minerals (MULTIVITAMIN & MINERAL PO) Take 1 tablet by mouth daily. Reported on 0/86/7619     OneTouch Delica Lancets 50D MISC daily.      ONETOUCH ULTRA test strip as needed.      Respiratory Therapy Supplies (FLUTTER) DEVI Use as directed 1 each 0   sertraline (ZOLOFT) 100 MG tablet Take 100 mg by mouth daily.     simethicone (MYLICON) 80 MG chewable tablet Chew 80 mg by mouth daily.     Current Facility-Administered Medications  Medication Dose Route Frequency Provider Last Rate Last Admin   Benralizumab SOSY 30 mg  30 mg Subcutaneous Q8 Weeks Rexene Alberts M, DO   30 mg at 12/31/20 1024   tezepelumab-ekko (TEZSPIRE) 210 MG/1.91ML syringe 210 mg  210 mg Subcutaneous Q28 days Garnet Sierras, DO   210 mg at 04/14/21 1517   Allergies: Allergies  Allergen Reactions   Codeine Nausea Only and Other (See Comments)    GI upset   Lipitor [Atorvastatin] Other (See Comments)    "Made my stomach burn"   Pravachol [Pravastatin Sodium] Other (See Comments)    Caused joint pain   I reviewed his past medical history, social history, family history, and environmental history and no significant changes have been reported from his previous visit.  Review of Systems  Constitutional:  Negative for appetite change, chills, fever and unexpected weight change.  HENT:  Positive for congestion and rhinorrhea.   Eyes:  Negative for itching.  Respiratory:  Positive for shortness of breath. Negative for cough, chest tightness and wheezing.   Gastrointestinal:  Negative for abdominal pain.  Skin:  Negative for rash.  Allergic/Immunologic: Positive for environmental allergies.  Neurological:  Negative for headaches.   Objective: BP 124/74    Pulse 68    Temp 98.5 F (36.9 C)    Resp 16    Ht 5\' 6"  (1.676 m)    Wt 198 lb 4 oz (89.9 kg)    SpO2 94%    BMI 32.00 kg/m  Body mass index is 32 kg/m. Physical Exam Vitals and nursing note reviewed.  Constitutional:      Appearance: Normal appearance. He is well-developed.  HENT:     Head: Normocephalic and atraumatic.     Right Ear: Tympanic membrane and external ear normal.     Left Ear: Tympanic  membrane and external ear normal.     Nose: Nose normal.     Mouth/Throat:     Mouth: Mucous membranes are moist.     Pharynx: Oropharynx is clear.  Eyes:     Conjunctiva/sclera: Conjunctivae normal.  Cardiovascular:     Rate and Rhythm:  Normal rate and regular rhythm.     Heart sounds: Normal heart sounds. No murmur heard. Pulmonary:     Effort: Pulmonary effort is normal.     Breath sounds: No wheezing, rhonchi or rales.     Comments: Decreased breath sounds throughout. Musculoskeletal:     Cervical back: Neck supple.  Skin:    General: Skin is warm.     Findings: No rash.  Neurological:     Mental Status: He is alert and oriented to person, place, and time.  Psychiatric:        Behavior: Behavior normal.  Previous notes and tests were reviewed. The plan was reviewed with the patient/family, and all questions/concerned were addressed.  It was my pleasure to see Elmo today and participate in his care. Please feel free to contact me with any questions or concerns.  Sincerely,  Rexene Alberts, DO Allergy & Immunology  [facility] office: [place] office: [phone]

## 2021-04-14 ENCOUNTER — Ambulatory Visit (INDEPENDENT_AMBULATORY_CARE_PROVIDER_SITE_OTHER): Payer: Medicare Other

## 2021-04-14 ENCOUNTER — Other Ambulatory Visit: Payer: Self-pay

## 2021-04-14 ENCOUNTER — Ambulatory Visit (INDEPENDENT_AMBULATORY_CARE_PROVIDER_SITE_OTHER): Payer: Medicare Other | Admitting: Allergy

## 2021-04-14 ENCOUNTER — Encounter: Payer: Self-pay | Admitting: Allergy

## 2021-04-14 VITALS — BP 124/74 | HR 68 | Temp 98.5°F | Resp 16 | Ht 66.0 in | Wt 198.2 lb

## 2021-04-14 DIAGNOSIS — J302 Other seasonal allergic rhinitis: Secondary | ICD-10-CM

## 2021-04-14 DIAGNOSIS — J3089 Other allergic rhinitis: Secondary | ICD-10-CM

## 2021-04-14 DIAGNOSIS — J4550 Severe persistent asthma, uncomplicated: Secondary | ICD-10-CM | POA: Diagnosis not present

## 2021-04-14 DIAGNOSIS — K219 Gastro-esophageal reflux disease without esophagitis: Secondary | ICD-10-CM | POA: Diagnosis not present

## 2021-04-14 DIAGNOSIS — J449 Chronic obstructive pulmonary disease, unspecified: Secondary | ICD-10-CM | POA: Diagnosis not present

## 2021-04-14 MED ORDER — ALBUTEROL SULFATE (2.5 MG/3ML) 0.083% IN NEBU: 2.5000 mg | INHALATION_SOLUTION | RESPIRATORY_TRACT | 1 refills | Status: DC | PRN

## 2021-04-14 MED ORDER — BREZTRI AEROSPHERE 160-9-4.8 MCG/ACT IN AERO: 2.0000 | INHALATION_SPRAY | Freq: Two times a day (BID) | RESPIRATORY_TRACT | 5 refills | Status: DC

## 2021-04-14 MED ORDER — AZELASTINE HCL 0.1 % NA SOLN: 1.0000 | Freq: Two times a day (BID) | NASAL | 5 refills | Status: AC | PRN

## 2021-04-14 MED ORDER — FLUTICASONE PROPIONATE 50 MCG/ACT NA SUSP: 1.0000 | Freq: Two times a day (BID) | NASAL | 5 refills | Status: AC | PRN

## 2021-04-14 NOTE — Assessment & Plan Note (Signed)
Past history - Some mild rhino conjunctivitis symptoms. 2021 bloodwork was positive to dust mites, grass pollen, tree pollen and ragweed pollen. Interim history - only using Singulair and Flonase. Still has symptoms.   Continue appropriate allergen avoidance measures  Continue montelukast 10 mg daily at bedtime.  May use over the counter antihistamines such as Zyrtec (cetirizine), Claritin (loratadine), Allegra (fexofenadine), or Xyzal (levocetirizine) daily as needed.  May use azelastine nasal spray 1-2 sprays per nostril twice a day as needed for drainage  May use fluticasone nasal spray, one spray per nostril twice a day.   Nasal saline lavage (NeilMed) has been recommended as needed and prior to medicated nasal sprays along with instructions for proper administration.

## 2021-04-14 NOTE — Assessment & Plan Note (Signed)
Past history - Usually has issues with his breathing twice a year - in the spring and fall. Follows with cardiology and apparently also with pulmonology. Ex-smoker. Nucala not covered. On Fasenra before.  Interim history - started Tezspire on 02/13/21 and states it's helping him breathe better. He doesn't have a nebulizer at home so he was not able to start budesonide neb. Using albuterol a few times per week. No prednisone since the last visit.   Today's spirometry showed mixed obstruction and restriction.  Please bring in all your medications to the next visit. Daily controller medication(s):   Use Breztri 2 puffs twice a day with spacer and rinse mouth afterwards.  Continue with Singulair 10mg  daily at night.   Continue Tezspire injections every 4 weeks - given today.   Nebulizer machine given.   May use albuterol rescue inhaler 2 puffs or nebulizer every 4 to 6 hours as needed for shortness of breath, chest tightness, coughing, and wheezing. May use albuterol rescue inhaler 2 puffs 5 to 15 minutes prior to strenuous physical activities. Monitor frequency of use.   Get spirometry at next visit.  If no improvement will add on Pulmicort neb next.

## 2021-04-14 NOTE — Patient Instructions (Addendum)
Please bring in all your medications to the next visit.  COPD/asthma Daily controller medication(s):  Use Breztri 2 puffs twice a day with spacer and rinse mouth afterwards. Continue with Singulair 10mg  daily at night.  Continue Tezspire injections every 4 weeks - given today.  Nebulizer machine given.  May use albuterol rescue inhaler 2 puffs or nebulizer every 4 to 6 hours as needed for shortness of breath, chest tightness, coughing, and wheezing. May use albuterol rescue inhaler 2 puffs 5 to 15 minutes prior to strenuous physical activities. Monitor frequency of use.  Asthma control goals:  Full participation in all desired activities (may need albuterol before activity) Albuterol use two times or less a week on average (not counting use with activity) Cough interfering with sleep two times or less a month Oral steroids no more than once a year No hospitalizations  Other allergic rhinitis Allergy testing was positive to: dust mites, grass pollen, tree pollen and ragweed pollen. Continue appropriate allergen avoidance measures Continue montelukast 10 mg daily at bedtime. May use over the counter antihistamines such as Zyrtec (cetirizine), Claritin (loratadine), Allegra (fexofenadine), or Xyzal (levocetirizine) daily as needed.  May use azelastine nasal spray 1-2 sprays per nostril twice a day as needed for drainage May use fluticasone nasal spray, one spray per nostril twice a day.  Nasal saline lavage (NeilMed) has been recommended as needed and prior to medicated nasal sprays along with instructions for proper administration.    Gastroesophageal reflux disease without esophagitis Continue omeprazole as prescribed.  Follow up in 2 months or sooner if needed.

## 2021-04-14 NOTE — Assessment & Plan Note (Signed)
Stable.  Continue appropriate reflux list on modifications and omeprazole as prescribed.

## 2021-05-09 DIAGNOSIS — J4550 Severe persistent asthma, uncomplicated: Secondary | ICD-10-CM | POA: Diagnosis not present

## 2021-05-12 ENCOUNTER — Other Ambulatory Visit: Payer: Self-pay

## 2021-05-12 ENCOUNTER — Ambulatory Visit (INDEPENDENT_AMBULATORY_CARE_PROVIDER_SITE_OTHER): Payer: Medicare Other | Admitting: *Deleted

## 2021-05-12 DIAGNOSIS — J4550 Severe persistent asthma, uncomplicated: Secondary | ICD-10-CM

## 2021-06-06 DIAGNOSIS — J4550 Severe persistent asthma, uncomplicated: Secondary | ICD-10-CM | POA: Diagnosis not present

## 2021-06-09 ENCOUNTER — Ambulatory Visit (INDEPENDENT_AMBULATORY_CARE_PROVIDER_SITE_OTHER): Payer: Medicare Other

## 2021-06-09 DIAGNOSIS — J4550 Severe persistent asthma, uncomplicated: Secondary | ICD-10-CM

## 2021-06-10 NOTE — Patient Outreach (Signed)
[facility]) Care Management ? ?06/10/2021 ? ?Douglas Hansen ?1945/07/29 ?[id number] ? ? ?Received referral for Care Management from Insurance plan. Assigned patient to Douglas David, RN Care Coordinator for follow up. ? ? ?[name] ?[place] Management Assistant ?[phone] ? ?

## 2021-06-12 ENCOUNTER — Other Ambulatory Visit: Payer: Self-pay | Admitting: *Deleted

## 2021-06-12 NOTE — Patient Outreach (Signed)
[facility]) Care Management  06/12/2021  Douglas Hansen 04/01/1945 [id number]   Referral received from insurance company for high risk patient however case not opened as member is active with external care management program.  Valente David, RN, MSN, [place] Manager [phone]

## 2021-07-04 DIAGNOSIS — J4550 Severe persistent asthma, uncomplicated: Secondary | ICD-10-CM | POA: Diagnosis not present

## 2021-07-07 ENCOUNTER — Ambulatory Visit (INDEPENDENT_AMBULATORY_CARE_PROVIDER_SITE_OTHER): Payer: Medicare Other

## 2021-07-07 DIAGNOSIS — J4550 Severe persistent asthma, uncomplicated: Secondary | ICD-10-CM | POA: Diagnosis not present

## 2021-07-12 ENCOUNTER — Emergency Department (HOSPITAL_COMMUNITY): Payer: Medicare Other

## 2021-07-12 ENCOUNTER — Ambulatory Visit
Admission: EM | Admit: 2021-07-12 | Discharge: 2021-07-12 | Disposition: A | Discharge status: Home / Self Care | Payer: Medicare Other

## 2021-07-12 ENCOUNTER — Encounter (HOSPITAL_COMMUNITY): Payer: Self-pay | Admitting: Internal Medicine

## 2021-07-12 ENCOUNTER — Encounter: Payer: Self-pay | Admitting: Emergency Medicine

## 2021-07-12 ENCOUNTER — Inpatient Hospital Stay (HOSPITAL_COMMUNITY)
Admission: EM | Admit: 2021-07-12 | Discharge: 2021-07-14 | DRG: 208 | Disposition: A | Discharge status: Home / Self Care | Payer: Medicare Other | Attending: Internal Medicine | Admitting: Internal Medicine

## 2021-07-12 DIAGNOSIS — R509 Fever, unspecified: Secondary | ICD-10-CM | POA: Diagnosis not present

## 2021-07-12 DIAGNOSIS — E669 Obesity, unspecified: Secondary | ICD-10-CM | POA: Diagnosis present

## 2021-07-12 DIAGNOSIS — Z825 Family history of asthma and other chronic lower respiratory diseases: Secondary | ICD-10-CM

## 2021-07-12 DIAGNOSIS — Z885 Allergy status to narcotic agent status: Secondary | ICD-10-CM | POA: Diagnosis not present

## 2021-07-12 DIAGNOSIS — Z79899 Other long term (current) drug therapy: Secondary | ICD-10-CM

## 2021-07-12 DIAGNOSIS — R7981 Abnormal blood-gas level: Secondary | ICD-10-CM

## 2021-07-12 DIAGNOSIS — Z8249 Family history of ischemic heart disease and other diseases of the circulatory system: Secondary | ICD-10-CM

## 2021-07-12 DIAGNOSIS — I10 Essential (primary) hypertension: Secondary | ICD-10-CM

## 2021-07-12 DIAGNOSIS — R000 Tachycardia, unspecified: Secondary | ICD-10-CM | POA: Diagnosis not present

## 2021-07-12 DIAGNOSIS — J45901 Unspecified asthma with (acute) exacerbation: Secondary | ICD-10-CM | POA: Diagnosis present

## 2021-07-12 DIAGNOSIS — E785 Hyperlipidemia, unspecified: Secondary | ICD-10-CM | POA: Diagnosis not present

## 2021-07-12 DIAGNOSIS — M542 Cervicalgia: Secondary | ICD-10-CM | POA: Diagnosis not present

## 2021-07-12 DIAGNOSIS — F4310 Post-traumatic stress disorder, unspecified: Secondary | ICD-10-CM | POA: Diagnosis present

## 2021-07-12 DIAGNOSIS — M436 Torticollis: Secondary | ICD-10-CM | POA: Diagnosis present

## 2021-07-12 DIAGNOSIS — Z823 Family history of stroke: Secondary | ICD-10-CM | POA: Diagnosis not present

## 2021-07-12 DIAGNOSIS — R918 Other nonspecific abnormal finding of lung field: Secondary | ICD-10-CM | POA: Diagnosis not present

## 2021-07-12 DIAGNOSIS — K219 Gastro-esophageal reflux disease without esophagitis: Secondary | ICD-10-CM | POA: Diagnosis not present

## 2021-07-12 DIAGNOSIS — J9601 Acute respiratory failure with hypoxia: Secondary | ICD-10-CM | POA: Diagnosis not present

## 2021-07-12 DIAGNOSIS — Z91199 Patient's noncompliance with other medical treatment and regimen due to unspecified reason: Secondary | ICD-10-CM

## 2021-07-12 DIAGNOSIS — J441 Chronic obstructive pulmonary disease with (acute) exacerbation: Secondary | ICD-10-CM | POA: Diagnosis not present

## 2021-07-12 DIAGNOSIS — I517 Cardiomegaly: Secondary | ICD-10-CM | POA: Diagnosis not present

## 2021-07-12 DIAGNOSIS — Z888 Allergy status to other drugs, medicaments and biological substances status: Secondary | ICD-10-CM | POA: Diagnosis not present

## 2021-07-12 DIAGNOSIS — R0902 Hypoxemia: Secondary | ICD-10-CM | POA: Diagnosis not present

## 2021-07-12 DIAGNOSIS — M47812 Spondylosis without myelopathy or radiculopathy, cervical region: Secondary | ICD-10-CM | POA: Diagnosis present

## 2021-07-12 DIAGNOSIS — Z7984 Long term (current) use of oral hypoglycemic drugs: Secondary | ICD-10-CM

## 2021-07-12 DIAGNOSIS — Z20822 Contact with and (suspected) exposure to covid-19: Secondary | ICD-10-CM | POA: Diagnosis present

## 2021-07-12 DIAGNOSIS — Z833 Family history of diabetes mellitus: Secondary | ICD-10-CM | POA: Diagnosis not present

## 2021-07-12 DIAGNOSIS — R7989 Other specified abnormal findings of blood chemistry: Secondary | ICD-10-CM | POA: Diagnosis not present

## 2021-07-12 DIAGNOSIS — J449 Chronic obstructive pulmonary disease, unspecified: Secondary | ICD-10-CM | POA: Diagnosis present

## 2021-07-12 DIAGNOSIS — Z6830 Body mass index (BMI) 30.0-30.9, adult: Secondary | ICD-10-CM

## 2021-07-12 DIAGNOSIS — E119 Type 2 diabetes mellitus without complications: Secondary | ICD-10-CM | POA: Diagnosis not present

## 2021-07-12 DIAGNOSIS — I119 Hypertensive heart disease without heart failure: Secondary | ICD-10-CM | POA: Diagnosis present

## 2021-07-12 DIAGNOSIS — R0602 Shortness of breath: Secondary | ICD-10-CM | POA: Diagnosis not present

## 2021-07-12 DIAGNOSIS — M4802 Spinal stenosis, cervical region: Secondary | ICD-10-CM | POA: Diagnosis present

## 2021-07-12 DIAGNOSIS — Z8701 Personal history of pneumonia (recurrent): Secondary | ICD-10-CM

## 2021-07-12 DIAGNOSIS — Z7982 Long term (current) use of aspirin: Secondary | ICD-10-CM | POA: Diagnosis not present

## 2021-07-12 DIAGNOSIS — E875 Hyperkalemia: Secondary | ICD-10-CM

## 2021-07-12 DIAGNOSIS — Z87891 Personal history of nicotine dependence: Secondary | ICD-10-CM

## 2021-07-12 DIAGNOSIS — M4312 Spondylolisthesis, cervical region: Secondary | ICD-10-CM | POA: Diagnosis present

## 2021-07-12 DIAGNOSIS — G4733 Obstructive sleep apnea (adult) (pediatric): Secondary | ICD-10-CM | POA: Diagnosis not present

## 2021-07-12 DIAGNOSIS — Z9989 Dependence on other enabling machines and devices: Secondary | ICD-10-CM

## 2021-07-12 DIAGNOSIS — J4489 Other specified chronic obstructive pulmonary disease: Secondary | ICD-10-CM | POA: Diagnosis present

## 2021-07-12 DIAGNOSIS — E1165 Type 2 diabetes mellitus with hyperglycemia: Secondary | ICD-10-CM | POA: Diagnosis not present

## 2021-07-12 DIAGNOSIS — E1151 Type 2 diabetes mellitus with diabetic peripheral angiopathy without gangrene: Secondary | ICD-10-CM | POA: Diagnosis present

## 2021-07-12 LAB — URINALYSIS, ROUTINE W REFLEX MICROSCOPIC
Bacteria, UA: NONE SEEN
Bilirubin Urine: NEGATIVE
Glucose, UA: NEGATIVE mg/dL
Hgb urine dipstick: NEGATIVE
Ketones, ur: 5 mg/dL — AB
Leukocytes,Ua: NEGATIVE
Nitrite: NEGATIVE
Protein, ur: 30 mg/dL — AB
Specific Gravity, Urine: 1.017 (ref 1.005–1.030)
pH: 5 (ref 5.0–8.0)

## 2021-07-12 LAB — CBC WITH DIFFERENTIAL/PLATELET
Abs Immature Granulocytes: 0.03 10*3/uL (ref 0.00–0.07)
Basophils Absolute: 0 10*3/uL (ref 0.0–0.1)
Basophils Relative: 0 %
Eosinophils Absolute: 0 10*3/uL (ref 0.0–0.5)
Eosinophils Relative: 0 %
HCT: 47.1 % (ref 39.0–52.0)
Hemoglobin: 15.3 g/dL (ref 13.0–17.0)
Immature Granulocytes: 0 %
Lymphocytes Relative: 20 %
Lymphs Abs: 1.9 10*3/uL (ref 0.7–4.0)
MCH: 30.6 pg (ref 26.0–34.0)
MCHC: 32.5 g/dL (ref 30.0–36.0)
MCV: 94.2 fL (ref 80.0–100.0)
Monocytes Absolute: 1.4 10*3/uL — ABNORMAL HIGH (ref 0.1–1.0)
Monocytes Relative: 14 %
Neutro Abs: 6.5 10*3/uL (ref 1.7–7.7)
Neutrophils Relative %: 66 %
Platelets: 191 10*3/uL (ref 150–400)
RBC: 5 MIL/uL (ref 4.22–5.81)
RDW: 13.7 % (ref 11.5–15.5)
WBC: 9.9 10*3/uL (ref 4.0–10.5)
nRBC: 0 % (ref 0.0–0.2)

## 2021-07-12 LAB — BASIC METABOLIC PANEL WITH GFR
Anion gap: 7 (ref 5–15)
BUN: 15 mg/dL (ref 8–23)
CO2: 27 mmol/L (ref 22–32)
Calcium: 10.2 mg/dL (ref 8.9–10.3)
Chloride: 102 mmol/L (ref 98–111)
Creatinine, Ser: 0.95 mg/dL (ref 0.61–1.24)
GFR, Estimated: >60 mL/min
Glucose, Bld: 116 mg/dL — ABNORMAL HIGH (ref 70–99)
Potassium: 4 mmol/L (ref 3.5–5.1)
Sodium: 136 mmol/L (ref 135–145)

## 2021-07-12 LAB — LACTIC ACID, PLASMA
Lactic Acid, Venous: 1 mmol/L (ref 0.5–1.9)
Lactic Acid, Venous: 1.5 mmol/L (ref 0.5–1.9)

## 2021-07-12 LAB — BRAIN NATRIURETIC PEPTIDE: B Natriuretic Peptide: 32.8 pg/mL (ref 0.0–100.0)

## 2021-07-12 LAB — GLUCOSE, CAPILLARY: Glucose-Capillary: 185 mg/dL — ABNORMAL HIGH (ref 70–99)

## 2021-07-12 LAB — RESP PANEL BY RT-PCR (FLU A&B, COVID) ARPGX2
Influenza A by PCR: NEGATIVE
Influenza B by PCR: NEGATIVE
SARS Coronavirus 2 by RT PCR: NEGATIVE

## 2021-07-12 MED ORDER — SODIUM CHLORIDE 0.9 % IV SOLN: 500.0000 mg | Freq: Once | INTRAVENOUS | Status: DC

## 2021-07-12 MED ORDER — MONTELUKAST SODIUM 10 MG PO TABS
10.0000 mg | ORAL_TABLET | Freq: Every day | ORAL | Status: DC
  Administered 2021-07-12 – 2021-07-13 (×2): 10 mg via ORAL
  Filled 2021-07-12 (×2): qty 1

## 2021-07-12 MED ORDER — IRBESARTAN 150 MG PO TABS
150.0000 mg | ORAL_TABLET | Freq: Every day | ORAL | Status: DC
  Administered 2021-07-12: 150 mg via ORAL
  Filled 2021-07-12: qty 1

## 2021-07-12 MED ORDER — SODIUM CHLORIDE 0.9% FLUSH
3.0000 mL | Freq: Two times a day (BID) | INTRAVENOUS | Status: DC
  Administered 2021-07-12 – 2021-07-13 (×2): 3 mL via INTRAVENOUS

## 2021-07-12 MED ORDER — ALBUTEROL SULFATE (2.5 MG/3ML) 0.083% IN NEBU: 2.5000 mg | INHALATION_SOLUTION | RESPIRATORY_TRACT | Status: DC | PRN

## 2021-07-12 MED ORDER — PREDNISONE 20 MG PO TABS
40.0000 mg | ORAL_TABLET | Freq: Every day | ORAL | Status: DC
  Administered 2021-07-13 – 2021-07-14 (×2): 40 mg via ORAL
  Filled 2021-07-12 (×2): qty 2

## 2021-07-12 MED ORDER — CARVEDILOL 3.125 MG PO TABS
6.2500 mg | ORAL_TABLET | Freq: Two times a day (BID) | ORAL | Status: DC
  Administered 2021-07-13 – 2021-07-14 (×4): 6.25 mg via ORAL
  Filled 2021-07-12 (×4): qty 2

## 2021-07-12 MED ORDER — INSULIN ASPART 100 UNIT/ML IJ SOLN
0.0000 [IU] | Freq: Three times a day (TID) | INTRAMUSCULAR | Status: DC
  Administered 2021-07-13: 2 [IU] via SUBCUTANEOUS
  Administered 2021-07-13: 3 [IU] via SUBCUTANEOUS
  Administered 2021-07-13: 5 [IU] via SUBCUTANEOUS
  Administered 2021-07-14 (×3): 2 [IU] via SUBCUTANEOUS

## 2021-07-12 MED ORDER — ACETAMINOPHEN 650 MG RE SUPP: 650.0000 mg | Freq: Four times a day (QID) | RECTAL | Status: DC | PRN

## 2021-07-12 MED ORDER — ACETAMINOPHEN 325 MG PO TABS
650.0000 mg | ORAL_TABLET | Freq: Four times a day (QID) | ORAL | Status: DC | PRN
  Administered 2021-07-12: 650 mg via ORAL
  Filled 2021-07-12 (×2): qty 2

## 2021-07-12 MED ORDER — AMLODIPINE BESYLATE 5 MG PO TABS
10.0000 mg | ORAL_TABLET | Freq: Once | ORAL | Status: AC
Start: 2021-07-12 — End: 2021-07-12
  Administered 2021-07-12: 10 mg via ORAL
  Filled 2021-07-12: qty 2

## 2021-07-12 MED ORDER — IPRATROPIUM-ALBUTEROL 0.5-2.5 (3) MG/3ML IN SOLN
3.0000 mL | Freq: Once | RESPIRATORY_TRACT | Status: AC
  Administered 2021-07-12: 3 mL via RESPIRATORY_TRACT
  Filled 2021-07-12: qty 3

## 2021-07-12 MED ORDER — ALBUTEROL SULFATE (2.5 MG/3ML) 0.083% IN NEBU
2.5000 mg | INHALATION_SOLUTION | Freq: Once | RESPIRATORY_TRACT | Status: AC
  Administered 2021-07-12: 2.5 mg via RESPIRATORY_TRACT
  Filled 2021-07-12: qty 3

## 2021-07-12 MED ORDER — IPRATROPIUM-ALBUTEROL 0.5-2.5 (3) MG/3ML IN SOLN
3.0000 mL | Freq: Four times a day (QID) | RESPIRATORY_TRACT | Status: DC
  Administered 2021-07-12: 3 mL via RESPIRATORY_TRACT
  Filled 2021-07-12: qty 3

## 2021-07-12 MED ORDER — AMLODIPINE BESYLATE 5 MG PO TABS
10.0000 mg | ORAL_TABLET | Freq: Every day | ORAL | Status: DC
  Administered 2021-07-13 – 2021-07-14 (×2): 10 mg via ORAL
  Filled 2021-07-12 (×2): qty 2

## 2021-07-12 MED ORDER — SODIUM CHLORIDE 0.9 % IV SOLN
1.0000 g | Freq: Once | INTRAVENOUS | Status: AC
  Administered 2021-07-12: 1 g via INTRAVENOUS
  Filled 2021-07-12: qty 10

## 2021-07-12 MED ORDER — IPRATROPIUM-ALBUTEROL 0.5-2.5 (3) MG/3ML IN SOLN
3.0000 mL | Freq: Three times a day (TID) | RESPIRATORY_TRACT | Status: DC
  Administered 2021-07-13: 3 mL via RESPIRATORY_TRACT
  Filled 2021-07-12: qty 3

## 2021-07-12 MED ORDER — POLYETHYLENE GLYCOL 3350 17 G PO PACK: 17.0000 g | PACK | Freq: Every day | ORAL | Status: DC | PRN

## 2021-07-12 MED ORDER — METHOCARBAMOL 500 MG PO TABS
750.0000 mg | ORAL_TABLET | Freq: Four times a day (QID) | ORAL | Status: DC | PRN
  Administered 2021-07-12 – 2021-07-14 (×5): 750 mg via ORAL
  Filled 2021-07-12 (×5): qty 2

## 2021-07-12 MED ORDER — ENOXAPARIN SODIUM 40 MG/0.4ML IJ SOSY
40.0000 mg | PREFILLED_SYRINGE | INTRAMUSCULAR | Status: DC
  Administered 2021-07-12 – 2021-07-13 (×2): 40 mg via SUBCUTANEOUS
  Filled 2021-07-12 (×2): qty 0.4

## 2021-07-12 MED ORDER — METHYLPREDNISOLONE SODIUM SUCC 125 MG IJ SOLR
125.0000 mg | Freq: Once | INTRAMUSCULAR | Status: AC
  Administered 2021-07-12: 125 mg via INTRAVENOUS
  Filled 2021-07-12: qty 2

## 2021-07-12 MED ORDER — FUROSEMIDE 40 MG PO TABS
20.0000 mg | ORAL_TABLET | Freq: Every day | ORAL | Status: DC
Start: 2021-07-13 — End: 2021-07-14
  Administered 2021-07-13 – 2021-07-14 (×2): 20 mg via ORAL
  Filled 2021-07-12 (×2): qty 1

## 2021-07-12 MED ORDER — SERTRALINE HCL 50 MG PO TABS
100.0000 mg | ORAL_TABLET | Freq: Every day | ORAL | Status: DC
  Administered 2021-07-13 – 2021-07-14 (×2): 100 mg via ORAL
  Filled 2021-07-12 (×2): qty 2

## 2021-07-12 MED ORDER — BUDESON-GLYCOPYRROL-FORMOTEROL 160-9-4.8 MCG/ACT IN AERO: 2.0000 | INHALATION_SPRAY | Freq: Two times a day (BID) | RESPIRATORY_TRACT | Status: DC

## 2021-07-12 NOTE — Progress Notes (Signed)
Pt refused CPAP for QHS, states "he used to wear one but don't anymore". I explained that he has one order for at night to use here while in the hospital pt states " his neck hurts and he prefer not to use. RT explained if he changes his mind to be sure to let us know and we will bring machine up.Pt in no distress ?

## 2021-07-12 NOTE — ED Provider Notes (Signed)
[facility]    CSN: [id number] Arrival date & time: 07/12/21  1357      History   Chief Complaint Chief Complaint  Patient presents with   Torticollis    HPI Douglas Hansen is a 76 y.o. male.   Patient presents with neck stiffness and pain that started approximately 2 days ago.  Denies any apparent injury.  Denies any associated upper respiratory symptoms, cough, fever at home.  Denies any known sick contacts.  Patient has been using over-the-counter creams and taking Tylenol with no improvement in pain.  Denies history of chronic neck pain.  Was called to room by nurse during triage given that patient had associated fever, low oxygen saturation 86%, and tachypnea.  Patient reports history of COPD but does not wear oxygen.  He reports that he takes his oxygen at home and is typically 94 to 95%.  Denies any chest pain or shortness of breath.    Past Medical History:  Diagnosis Date   Agent orange exposure    Arthritis    Asthma    COPD (chronic obstructive pulmonary disease) ([place])    Diabetes mellitus    Hyperlipemia    Hypertension    OSA on CPAP    Pneumonia    PTSD (post-traumatic stress disorder)     Patient Active Problem List   Diagnosis Date Noted   Chest pain 01/02/2020   Type 2 diabetes mellitus without complication ([place]) 23/76/2831   Hypoxia 11/22/2019   Pneumonia due to COVID-19 virus 11/22/2019   Dyslipidemia 06/03/2016   Other fatigue 06/03/2016   Claudication ([place]) 06/03/2016   Acute sinusitis 12/16/2015   Asthma with acute exacerbation 06/17/2015   Acute bronchitis 06/17/2015   Gastroesophageal reflux disease without esophagitis 06/17/2015   Shortness of breath 04/17/2015   Acute pulmonary edema ([place]) 04/17/2015   Chronic obstructive pulmonary disease, unspecified copd, unspecified chronic bronchitis type 01/07/2015   Moderate persistent asthma 01/07/2015   Seasonal and perennial allergic rhinitis 01/07/2015   Obstructive sleep apnea  treated with continuous positive airway pressure (CPAP) 01/07/2015   Asthma-COPD overlap syndrome (HCC)     Class: Chronic   Pneumonia due to other gram-negative bacteria ([place]) 01/30/2011   Cough 06/09/2010   COPD (chronic obstructive pulmonary disease) ([place]) 06/09/2010   OSA on CPAP    Diabetes mellitus    Hyperlipemia    Essential hypertension     Past Surgical History:  Procedure Laterality Date   CARDIAC CATHETERIZATION     right thumb surgery     TONSILLECTOMY         Home Medications    Prior to Admission medications   Medication Sig Start Date End Date Taking? Authorizing Provider  albuterol (PROVENTIL) (2.5 MG/3ML) 0.083% nebulizer solution Take 3 mLs (2.5 mg total) by nebulization every 4 (four) hours as needed for wheezing or shortness of breath (coughing fits). 04/14/21  Yes Garnet Sierras, DO  albuterol (VENTOLIN HFA) 108 (90 Base) MCG/ACT inhaler INHALE 2 PUFFS BY MOUTH EVERY 4 HOURS AS NEEDED FOR WHEEZING OR SHORTNESS OF BREATH 08/22/19  Yes Garnet Sierras, DO  amLODipine (NORVASC) 10 MG tablet Take 10 mg by mouth daily. 04/06/19  Yes [provider]  amLODipine-olmesartan (AZOR) 10-40 MG tablet Take 1 tablet by mouth daily. 01/03/10  Yes [provider]  ascorbic acid (VITAMIN C) 500 MG tablet Take 1,000 mg by mouth daily.  04/06/19  Yes [provider]  aspirin EC 325 MG tablet Take 325 mg by  mouth daily.    Yes [provider]  azelastine (ASTELIN) 0.1 % nasal spray Place 1-2 sprays into both nostrils 2 (two) times daily as needed (nasal drainage). Use in each nostril as directed 04/14/21  Yes Garnet Sierras, DO  Budeson-Glycopyrrol-Formoterol (BREZTRI AEROSPHERE) 160-9-4.8 MCG/ACT AERO Inhale 2 puffs into the lungs in the morning and at bedtime. with spacer and rinse mouth afterwards. 04/14/21  Yes Garnet Sierras, DO  calcipotriene (DOVONOX) 0.005 % cream Apply topically as needed. 04/08/20  Yes [provider]  carvedilol (COREG) 12.5 MG  tablet Take 6.25 mg by mouth 2 (two) times daily with a meal.  11/09/19  Yes [provider]  Cellulose Carmellose Sodium POWD Apply 1 application topically daily as needed (irritation). Apply to feet   Yes [provider]  chlorhexidine (PERIDEX) 0.12 % solution Take by mouth. 08/07/20  Yes [provider]  Cholecalciferol (VITAMIN D-3 PO) Take 1 capsule by mouth daily.   Yes [provider]  clobetasol ointment (TEMOVATE) 1.28 % Apply 1 application topically 2 (two) times daily as needed (to affected area).  11/13/19  Yes [provider]  Diclofenac Sodium 1.5 % SOLN Apply 4 drops topically 4 (four) times daily as needed (as directed- to affected area).  10/17/14  Yes [provider]  EPINEPHrine 0.3 mg/0.3 mL IJ SOAJ injection Inject 0.3 mLs (0.3 mg total) into the muscle as needed for anaphylaxis. 11/16/19  Yes Padgett, Rae Halsted, MD  EYLEA 2 MG/0.05ML SOLN Place into the left eye every 30 (thirty) days. INJECTION 04/17/19  Yes [provider]  Fluocinolone Acetonide Body 0.01 % OIL Apply 1 application topically See admin instructions. Apply a small amount to affected area daily 11/09/19  Yes [provider]  fluticasone (FLONASE) 50 MCG/ACT nasal spray Place 1 spray into both nostrils 2 (two) times daily as needed (nasal congestion). 04/14/21  Yes Garnet Sierras, DO  furosemide (LASIX) 20 MG tablet Take 20 mg by mouth daily. 02/02/20  Yes [provider]  ketoconazole (NIZORAL) 2 % shampoo Apply 1 application topically 3 (three) times a week.   Yes [provider]  metFORMIN (GLUCOPHAGE) 500 MG tablet Take 250 mg by mouth 2 (two) times daily as needed (for elevated BGL).   Yes [provider]  montelukast (SINGULAIR) 10 MG tablet Take 1 tablet (10 mg total) by mouth at bedtime. 08/28/20  Yes Garnet Sierras, DO  Multiple Vitamins-Minerals (MULTIVITAMIN & MINERAL PO) Take 1 tablet by mouth daily. Reported on  06/17/2015   Yes [provider]  OneTouch Delica Lancets 78M MISC daily. 09/01/19  Yes [provider]  [facility] ULTRA test strip as needed.  09/01/19  Yes [provider]  Respiratory Therapy Supplies (FLUTTER) DEVI Use as directed 04/20/16  Yes Bobbitt, Sedalia Muta, MD  sertraline (ZOLOFT) 100 MG tablet Take 100 mg by mouth daily. 04/05/19  Yes [provider]  simethicone (MYLICON) 80 MG chewable tablet Chew 80 mg by mouth daily.   Yes [provider]  doxazosin (CARDURA) 1 MG tablet Take 1 mg by mouth daily. 04/03/19 01/02/20  [provider]    Family History Family History  Problem Relation Age of Onset   Emphysema Father    Asthma Father    Heart disease Father    Heart attack Father    Stroke Mother    Heart attack Brother    Diabetes Brother    Heart attack Brother    Diabetes Brother  Colon cancer Neg Hx     Social History Social History   Tobacco Use   Smoking status: Former    Packs/day: 1.00    Years: 20.00    Pack years: 20.00    Types: Cigarettes    Quit date: 03/09/1985    Years since quitting: 36.3   Smokeless tobacco: Never  Vaping Use   Vaping Use: Never used  Substance Use Topics   Alcohol use: No   Drug use: No     Allergies   Codeine, Lipitor [atorvastatin], and Pravachol [pravastatin sodium]   Review of Systems Review of Systems Per HPI  Physical Exam Triage Vital Signs ED Triage Vitals  Enc Vitals Group     BP 07/12/21 1504 (!) 165/90     Pulse Rate 07/12/21 1504 95     Resp 07/12/21 1518 (!) 25     Temp 07/12/21 1504 (!) 101 F (38.3 C)     Temp Source 07/12/21 1504 Oral     SpO2 07/12/21 1504 (!) 88 %     Weight --      Height --      Head Circumference --      Peak Flow --      Pain Score --      Pain Loc --      Pain Edu? --      Excl. in [place]? --    No data found.  Updated Vital Signs BP (!) 165/90 (BP Location: Left Arm)   Pulse 97   Temp (!) 101 F (38.3 C)  (Oral)   Resp (!) 25   SpO2 95%   Visual Acuity Right Eye Distance:   Left Eye Distance:   Bilateral Distance:    Right Eye Near:   Left Eye Near:    Bilateral Near:     Physical Exam Constitutional:      General: He is not in acute distress.    Appearance: Normal appearance. He is not toxic-appearing or diaphoretic.  HENT:     Head: Normocephalic and atraumatic.  Eyes:     Extraocular Movements: Extraocular movements intact.     Conjunctiva/sclera: Conjunctivae normal.  Neck:     Comments: Tenderness to palpation generalized throughout neck.  Patient holding neck in a flexed position.  No obvious swelling noted.  No crepitus or step-off noted. Cardiovascular:     Rate and Rhythm: Normal rate and regular rhythm.     Pulses: Normal pulses.     Heart sounds: Normal heart sounds.  Pulmonary:     Effort: No respiratory distress.     Breath sounds: Normal breath sounds.     Comments: Mild tachypnea noted on exam.  Musculoskeletal:     Cervical back: No crepitus. Pain with movement, spinous process tenderness and muscular tenderness present.  Neurological:     General: No focal deficit present.     Mental Status: He is alert and oriented to person, place, and time. Mental status is at baseline.  Psychiatric:        Mood and Affect: Mood normal.        Behavior: Behavior normal.        Thought Content: Thought content normal.        Judgment: Judgment normal.     UC Treatments / Results  Labs (all labs ordered are listed, but only abnormal results are displayed) Labs Reviewed - No data to display  EKG   Radiology No results found.  Procedures Procedures (including critical  care time)  Medications Ordered in UC Medications - No data to display  Initial Impression / Assessment and Plan / UC Course  I have reviewed the triage vital signs and the nursing notes.  Pertinent labs & imaging results that were available during my care of the patient were reviewed by me  and considered in my medical decision making (see chart for details).     There is concern for possible meningitis given fever associated with neck stiffness and pain.  Patient also has low oxygen saturation.  All of these factors warrant further evaluation and management at the hospital.  Unable to determine Brudzinski's sign given patient toleration and cooperation.  Patient was agreeable to go to the hospital and left via EMS transport.  Oxygen was placed prior to patient discharge with improvement in oxygen. Final Clinical Impressions(s) / UC Diagnoses   Final diagnoses:  Neck stiffness  Fever, unspecified  Low oxygen saturation     Discharge Instructions      Sent to hospital via EMS.    ED Prescriptions   None    PDMP not reviewed this encounter.   Teodora Medici, [place] 07/12/21 [phone]

## 2021-07-12 NOTE — ED Triage Notes (Signed)
Pt to ED via EMS from urgent care c/o stiff neck x 2 days and low o2 sats. Pt has hx of COPD., Urgent care reports o2 sats 85%RA. Last VS: 166/86, P95%, RR20; 92%RA- EMS reports no o2 sat < 92% during transport. Denies SHOB. No medications given by EMS.  ?

## 2021-07-12 NOTE — H&P (Addendum)
History and Physical   [name] HUD:[id number] DOB: [birth year] DOA: 07/12/2021  PCP: Janie Morning, DO   Patient coming from: Home then urgent care  Chief Complaint: Neck pain, hypoxia  HPI: Douglas Hansen is a 76 y.o. male with medical history significant of asthma, COPD, OSA on CPAP, diabetes, hyperlipidemia, hypertension, GERD, claudication, PTSD presenting with hypoxia and neck pain.  Patient presenting initially for neck pain for the past couple of days.  Went to urgent care to get checked out and was found to be hypoxic to the mid 80s on room air.  Sent to the ED for further evaluation.  He reports having neck stiffness for the past couple days he holds his head flexed and has difficulty straightening it out.  Denies chronic neck pain.  Denies significant URI symptoms but states he does have a chronic cough that may have been worse the past couple days.  Denies fever.  Further denies chills, chest pain, shortness of breath, abdominal pain, constipation, diarrhea, nausea, vomiting.  ED Course: Vital signs in the ED significant for fever to 101, heart rate in the 90s to 100s, blood pressure in the 160s to 190s, saturating well on 2 L but hypoxic to the mid to upper 80s on room air.  Lab work-up included BMP with glucose 116.  CBC within normal limits.  Lactic acid normal.  BNP pending.  Respiratory panel for flu and COVID-negative.  Chest x-ray without acute abnormality.  CT C-spine showing multiple level degenerative changes but no acute changes.  Patient received Solu-Medrol, DuoNeb, albuterol as well as amlodipine and irbesartan in the ED.  Review of Systems: As per HPI otherwise all other systems reviewed and are negative.  Past Medical History:  Diagnosis Date   Agent orange exposure    Arthritis    Asthma    Asthma with acute exacerbation 06/17/2015   COPD (chronic obstructive pulmonary disease) (HCC)    Diabetes mellitus    Hyperlipemia    Hypertension    OSA on CPAP     Pneumonia    Pneumonia due to COVID-19 virus 11/22/2019   PTSD (post-traumatic stress disorder)     Past Surgical History:  Procedure Laterality Date   CARDIAC CATHETERIZATION     right thumb surgery     TONSILLECTOMY      Social History  reports that he quit smoking about 36 years ago. His smoking use included cigarettes. He has a 20.00 pack-year smoking history. He has never used smokeless tobacco. He reports that he does not drink alcohol and does not use drugs.  Allergies  Allergen Reactions   Codeine Nausea Only and Other (See Comments)    GI upset   Lipitor [Atorvastatin] Other (See Comments)    "Made my stomach burn"   Pravachol [Pravastatin Sodium] Other (See Comments)    Caused joint pain    Family History  Problem Relation Age of Onset   Emphysema Father    Asthma Father    Heart disease Father    Heart attack Father    Stroke Mother    Heart attack Brother    Diabetes Brother    Heart attack Brother    Diabetes Brother    Colon cancer Neg Hx   Reviewed on admission  Prior to Admission medications   Medication Sig Start Date End Date Taking? Authorizing Provider  albuterol (PROVENTIL) (2.5 MG/3ML) 0.083% nebulizer solution Take 3 mLs (2.5 mg total) by nebulization every 4 (four) hours as needed for wheezing or  shortness of breath (coughing fits). 04/14/21   Garnet Sierras, DO  albuterol (VENTOLIN HFA) 108 (90 Base) MCG/ACT inhaler INHALE 2 PUFFS BY MOUTH EVERY 4 HOURS AS NEEDED FOR WHEEZING OR SHORTNESS OF BREATH 08/22/19   Garnet Sierras, DO  amLODipine (NORVASC) 10 MG tablet Take 10 mg by mouth daily. 04/06/19   [provider]  amLODipine-olmesartan (AZOR) 10-40 MG tablet Take 1 tablet by mouth daily. 01/03/10   [provider]  ascorbic acid (VITAMIN C) 500 MG tablet Take 1,000 mg by mouth daily.  04/06/19   [provider]  aspirin EC 325 MG tablet Take 325 mg by mouth daily.     [provider]  azelastine (ASTELIN) 0.1 % nasal  spray Place 1-2 sprays into both nostrils 2 (two) times daily as needed (nasal drainage). Use in each nostril as directed 04/14/21   Garnet Sierras, DO  Budeson-Glycopyrrol-Formoterol (BREZTRI AEROSPHERE) 160-9-4.8 MCG/ACT AERO Inhale 2 puffs into the lungs in the morning and at bedtime. with spacer and rinse mouth afterwards. 04/14/21   Garnet Sierras, DO  calcipotriene (DOVONOX) 0.005 % cream Apply topically as needed. 04/08/20   [provider]  carvedilol (COREG) 12.5 MG tablet Take 6.25 mg by mouth 2 (two) times daily with a meal.  11/09/19   [provider]  Cellulose Carmellose Sodium POWD Apply 1 application topically daily as needed (irritation). Apply to feet    [provider]  chlorhexidine (PERIDEX) 0.12 % solution Take by mouth. 08/07/20   [provider]  Cholecalciferol (VITAMIN D-3 PO) Take 1 capsule by mouth daily.    [provider]  clobetasol ointment (TEMOVATE) 5.63 % Apply 1 application topically 2 (two) times daily as needed (to affected area).  11/13/19   [provider]  Diclofenac Sodium 1.5 % SOLN Apply 4 drops topically 4 (four) times daily as needed (as directed- to affected area).  10/17/14   [provider]  EPINEPHrine 0.3 mg/0.3 mL IJ SOAJ injection Inject 0.3 mLs (0.3 mg total) into the muscle as needed for anaphylaxis. 11/16/19   Kennith Gain, MD  EYLEA 2 MG/0.05ML SOLN Place into the left eye every 30 (thirty) days. INJECTION 04/17/19   [provider]  Fluocinolone Acetonide Body 0.01 % OIL Apply 1 application topically See admin instructions. Apply a small amount to affected area daily 11/09/19   [provider]  fluticasone (FLONASE) 50 MCG/ACT nasal spray Place 1 spray into both nostrils 2 (two) times daily as needed (nasal congestion). 04/14/21   Garnet Sierras, DO  furosemide (LASIX) 20 MG tablet Take 20 mg by mouth daily. 02/02/20   [provider]  ketoconazole (NIZORAL) 2 % shampoo  Apply 1 application topically 3 (three) times a week.    [provider]  metFORMIN (GLUCOPHAGE) 500 MG tablet Take 250 mg by mouth 2 (two) times daily as needed (for elevated BGL).    [provider]  montelukast (SINGULAIR) 10 MG tablet Take 1 tablet (10 mg total) by mouth at bedtime. 08/28/20   Garnet Sierras, DO  Multiple Vitamins-Minerals (MULTIVITAMIN & MINERAL PO) Take 1 tablet by mouth daily. Reported on 06/17/2015    [provider]  OneTouch Delica Lancets 14H MISC daily. 09/01/19   [provider]  [place] ULTRA test strip as needed.  09/01/19   [provider]  Respiratory Therapy Supplies (FLUTTER) DEVI Use as directed 04/20/16   Bobbitt, [place] Muta, MD  sertraline (ZOLOFT) 100 MG  tablet Take 100 mg by mouth daily. 04/05/19   [provider]  simethicone (MYLICON) 80 MG chewable tablet Chew 80 mg by mouth daily.    [provider]  doxazosin (CARDURA) 1 MG tablet Take 1 mg by mouth daily. 04/03/19 01/02/20  [provider]    Physical Exam: Vitals:   07/12/21 1700 07/12/21 1715 07/12/21 1745 07/12/21 1900  BP: (!) 190/97 (!) 182/98 (!) 193/87 (!) 173/81  Pulse: (!) 101 96 (!) 110 (!) 110  Resp: 20 (!) '24 16 17  '$ Temp:      TempSrc:      SpO2: 98% 99% 98% 91%  Weight:      Height:        Physical Exam Constitutional:      General: He is not in acute distress.    Appearance: Normal appearance.  HENT:     Head: Normocephalic and atraumatic.     Mouth/Throat:     Mouth: Mucous membranes are moist.     Pharynx: Oropharynx is clear.  Eyes:     Extraocular Movements: Extraocular movements intact.     Pupils: Pupils are equal, round, and reactive to light.  Cardiovascular:     Rate and Rhythm: Regular rhythm. Tachycardia present.     Pulses: Normal pulses.     Heart sounds: Normal heart sounds.  Pulmonary:     Effort: Pulmonary effort is normal. No respiratory distress.     Breath sounds: Decreased breath  sounds present.  Abdominal:     General: Bowel sounds are normal. There is no distension.     Palpations: Abdomen is soft.     Tenderness: There is no abdominal tenderness.  Musculoskeletal:        General: No swelling or deformity.     Cervical back: Tenderness (Paraspinal musculature) present.  Skin:    General: Skin is warm and dry.  Neurological:     General: No focal deficit present.     Mental Status: Mental status is at baseline.   Labs on Admission: I have personally reviewed following labs and imaging studies  CBC: Recent Labs  Lab 07/12/21 1640  WBC 9.9  NEUTROABS 6.5  HGB 15.3  HCT 47.1  MCV 94.2  PLT 151    Basic Metabolic Panel: Recent Labs  Lab 07/12/21 1640  NA 136  K 4.0  CL 102  CO2 27  GLUCOSE 116*  BUN 15  CREATININE 0.95  CALCIUM 10.2    GFR: Estimated Creatinine Clearance: 66.3 mL/min (by C-G formula based on SCr of 0.95 mg/dL).  Liver Function Tests: No results for input(s): AST, ALT, ALKPHOS, BILITOT, PROT, ALBUMIN in the last 168 hours.  Urine analysis: No results found for: COLORURINE, APPEARANCEUR, [place], GLUCOSEU, HGBUR, BILIRUBINUR, KETONESUR, PROTEINUR, UROBILINOGEN, NITRITE, LEUKOCYTESUR  Radiological Exams on Admission: CT Cervical Spine Wo Contrast  Result Date: 07/12/2021 CLINICAL DATA:  Neck pain. EXAM: CT CERVICAL SPINE WITHOUT CONTRAST TECHNIQUE: Multidetector CT imaging of the cervical spine was performed without intravenous contrast. Multiplanar CT image reconstructions were also generated. RADIATION DOSE REDUCTION: This exam was performed according to the departmental dose-optimization program which includes automated exposure control, adjustment of the mA and/or kV according to patient size and/or use of iterative reconstruction technique. COMPARISON:  None Available. FINDINGS: Alignment: There is reversal of the normal cervical spine lordosis. There is approximately 1 mm anterolisthesis of C3 on C4. Skull base  and vertebrae: No acute fracture. No primary bone lesion or focal pathologic process. Soft tissues  and spinal canal: No prevertebral fluid or swelling. No visible canal hematoma. Disc levels: Marked severity endplate sclerosis and anterior osteophyte formation are seen at the levels of C4-C5, C5-C6, C6-C7 and C7-T1. Marked severity intervertebral disc space narrowing is seen at C3-C4, C4-C5, C5-C6, C6-C7 and C7-T1. Bilateral marked severity multilevel facet joint hypertrophy is noted. Upper chest: Negative. Other: None. IMPRESSION: 1. Marked severity multilevel degenerative changes without evidence of an acute fracture or subluxation. 2. Reversal of the normal cervical spine lordosis with approximately 1 mm anterolisthesis of C3 on C4. Electronically Signed   By: Virgina Norfolk M.D.   On: 07/12/2021 18:14   DG Chest Port 1 View  Result Date: 07/12/2021 CLINICAL DATA:  Shortness of breath EXAM: PORTABLE CHEST 1 VIEW COMPARISON:  Chest x-ray dated January 02, 2020 FINDINGS: The heart size and mediastinal contours are within normal limits. Both lungs are clear. The visualized skeletal structures are unremarkable. IMPRESSION: No active disease. Electronically Signed   By: Yetta Glassman M.D.   On: 07/12/2021 16:50    EKG: Not performed in the ED.  Assessment/Plan Principal Problem:   Acute respiratory failure with hypoxia (HCC) Active Problems:   OSA on CPAP   Diabetes mellitus ([place])   Hyperlipemia   Essential hypertension   Gastroesophageal reflux disease without esophagitis   Acute respiratory failure with hypoxia COPD exacerbation > Patient with known history of COPD/asthma overlap syndrome presenting with increased cough and hypoxia.  No overt wheezing but does have diminished breath sounds. > States that he may have been on home oxygen in the past but was not prescribed to him that was prescribed to his wife who passed away. > Feeling somewhat better with initial interventions in the ED.   Unclear if oxygen requirement will improve with further treatment of presumed exacerbation or if he has undiagnosed chronic oxygen requirement/chronic respiratory failure. > Given he has a fever in the ED there is concern for possible viral etiology given normal CBC.  Negative for flu and COVID we will check full respiratory viral panel. - Monitor on telemetry - Continue with daily steroids - Continue with scheduled DuoNebs and as needed albuterol - Continue home Symbicort when confirmed by pharmacy - Continue home Singulair - Check RVP  Neck pain > Presenting with 2 days of neck pain.  None meningitic in nature is able to flex his neck and in fact feels more comfortable in that position with difficulty extending.  Was placed in soft collar in the ED has been able to extend somewhat with this and is more comfortable. > Reports bilateral paraspinal neck muscle tightness on exam. > CT imaging did show chronic multilevel degenerative changes with no acute abnormality. - Continue soft collar - As needed Robaxin - Continue to monitor  OSA - Continue home CPAP   Diabetes - SSI   Hypertension - Continue home amlodipine, carvedilol, Lasix -We will await confirmation by pharmacy on other home medications  PTSD - Continue home sertraline  Hyperlipidemia - Plan to continue home rosuvastatin when dose confirmed by pharmacy.  DVT prophylaxis: Lovenox Code Status:   Full Family Communication:  Daughter updated at bedside Disposition Plan:   Patient is from:  Home  Anticipated DC to:  Home  Anticipated DC date:  1 to 2 days  Anticipated DC barriers: None  Consults called:  None Admission status:  Observation, telemetry  Severity of Illness: The appropriate patient status for this patient is OBSERVATION. Observation status is judged to be reasonable and necessary  in order to provide the required intensity of service to ensure the patient's safety. The patient's presenting symptoms,  physical exam findings, and initial radiographic and laboratory data in the context of their medical condition is felt to place them at decreased risk for further clinical deterioration. Furthermore, it is anticipated that the patient will be medically stable for discharge from the hospital within 2 midnights of admission.    Marcelyn Bruins MD [facility]  How to contact the [facility] Attending or Consulting provider [place] or covering provider during after hours [place], for this patient?   Check the care team in [facility] and look for a) attending/consulting [facility] provider listed and b) the [facility] team listed Log into [email or web address] and use [place] universal password to access. If you do not have the password, please contact the hospital operator. Locate the [facility] provider you are looking for under [facility] and page to a number that you can be directly reached. If you still have difficulty reaching the provider, please page the [facility] (Director on Call) for the Hospitalists listed on amion for assistance.  07/12/2021, 8:09 PM

## 2021-07-12 NOTE — ED Triage Notes (Signed)
Patient c/o stiff neck x 2 days, no apparent injury.  Patient has tried applying cream, numbing agent, Tylenol. ?

## 2021-07-12 NOTE — ED Notes (Signed)
Baxter Flattery, CMA called nurse and NP into room  ?Pt presents with tachypenia, decrease spo2, neck pain and fever. Pt placed on 2 L o2.  ?Spo2 rised from 90 rto 95.  ?Pt requested to use restroom. Pt assisted to restroom with wheelchair.  ?EMS called for patient  ? ?Patient is being discharged from the Urgent Care and sent to the Emergency Department via ems . Per [place] np, patient is in need of higher level of care due to symptoms and oxygen saturation. Patient is aware and verbalizes understanding of plan of care.  ?Vitals:  ? 07/12/21 1504  ?BP: (!) 165/90  ?Pulse: 95  ?Temp: (!) 101 ?F (38.3 ?C)  ?SpO2: (!) 88%  ?  ?

## 2021-07-12 NOTE — ED Provider Notes (Signed)
[name] DEPT Provider Note   CSN: [id number] Arrival date & time: 07/12/21  1613     History  Chief Complaint  Patient presents with   Neck Pain    stiffness   low o2 sats    Douglas Hansen is a 76 y.o. male.  Patient with history of COPD, asthma presents from urgent care.  He states he had a cough for about 2 days.  He also has had a stiff neck for about 2 days.  He states he woke up from bed and his neck was bent forward with his chin down.  He states attempts to straighten out his head would cause pain and attempt to turn his head to cause pain.  He went to urgent care today were sent to the ER when he was found to be hypoxic and febrile.  Denies headache denies chest pain denies abdominal pain.  Denies vomiting or diarrhea.  He states that he used to use oxygen support by nasal cannula which was originally prescribed to his wife, however when she passed away 6 months ago they took the oxygen away.      Home Medications Prior to Admission medications   Medication Sig Start Date End Date Taking? Authorizing Provider  albuterol (PROVENTIL) (2.5 MG/3ML) 0.083% nebulizer solution Take 3 mLs (2.5 mg total) by nebulization every 4 (four) hours as needed for wheezing or shortness of breath (coughing fits). 04/14/21  Yes Garnet Sierras, DO  albuterol (VENTOLIN HFA) 108 (90 Base) MCG/ACT inhaler INHALE 2 PUFFS BY MOUTH EVERY 4 HOURS AS NEEDED FOR WHEEZING OR SHORTNESS OF BREATH 08/22/19  Yes Garnet Sierras, DO  ascorbic acid (VITAMIN C) 500 MG tablet Take 1,000 mg by mouth daily.  04/06/19  Yes [provider]  azelastine (ASTELIN) 0.1 % nasal spray Place 1-2 sprays into both nostrils 2 (two) times daily as needed (nasal drainage). Use in each nostril as directed 04/14/21  Yes Garnet Sierras, DO  Budeson-Glycopyrrol-Formoterol (BREZTRI AEROSPHERE) 160-9-4.8 MCG/ACT AERO Inhale 2 puffs into the lungs in the morning and at bedtime. with spacer and rinse mouth  afterwards. 04/14/21  Yes Garnet Sierras, DO  budesonide-formoterol (SYMBICORT) 80-4.5 MCG/ACT inhaler Inhale 2 puffs into the lungs 2 (two) times daily.   Yes [provider]  calcipotriene (DOVONOX) 0.005 % cream Apply topically as needed. 04/08/20  Yes [provider]  Cellulose Carmellose Sodium POWD Apply 1 application topically daily as needed (irritation). Apply to feet   Yes [provider]  chlorhexidine (PERIDEX) 0.12 % solution Take by mouth. 08/07/20  Yes [provider]  Cholecalciferol (VITAMIN D-3 PO) Take 1 capsule by mouth daily.   Yes [provider]  clobetasol ointment (TEMOVATE) 4.09 % Apply 1 application topically 2 (two) times daily as needed (to affected area).  11/13/19  Yes [provider]  Diclofenac Sodium 1.5 % SOLN Apply 4 drops topically 4 (four) times daily as needed (as directed- to affected area).  10/17/14  Yes [provider]  EYLEA 2 MG/0.05ML SOLN Place into the left eye every 30 (thirty) days. INJECTION 04/17/19  Yes [provider]  Fluocinolone Acetonide Body 0.01 % OIL Apply 1 application topically See admin instructions. Apply a small amount to affected area daily 11/09/19  Yes [provider]  fluticasone (FLONASE) 50 MCG/ACT nasal spray Place 1 spray into both nostrils 2 (two) times daily as needed (nasal congestion). 04/14/21  Yes Garnet Sierras, DO  ketoconazole (NIZORAL) 2 %  shampoo Apply 1 application topically 3 (three) times a week.   Yes [provider]  Multiple Vitamins-Minerals (MULTIVITAMIN & MINERAL PO) Take 1 tablet by mouth daily. Reported on 06/17/2015   Yes [provider]  omeprazole (PRILOSEC) 20 MG capsule Take 20 mg by mouth daily.   Yes [provider]  OneTouch Delica Lancets 03T MISC daily. 09/01/19  Yes [provider]  [facility] ULTRA test strip as needed.  09/01/19  Yes [provider]  Respiratory Therapy Supplies (FLUTTER) DEVI  Use as directed 04/20/16  Yes Bobbitt, Sedalia Muta, MD  sertraline (ZOLOFT) 100 MG tablet Take 100 mg by mouth daily. 04/05/19  Yes [provider]  simethicone (MYLICON) 80 MG chewable tablet Chew 80 mg by mouth daily.   Yes [provider]  amLODipine (NORVASC) 10 MG tablet Take 10 mg by mouth daily. 04/06/19   [provider]  aspirin EC 81 MG EC tablet Take 1 tablet (81 mg total) by mouth daily. 07/17/21   [facility], Tami Lin, PA  carvedilol (COREG) 12.5 MG tablet Take 6.25 mg by mouth 2 (two) times daily with a meal.  11/09/19   [provider]  doxazosin (CARDURA) 1 MG tablet doxazosin 1 mg tablet  TAKE 1 TABLET BY MOUTH EVERY DAY    [provider]  EPINEPHrine 0.3 mg/0.3 mL IJ SOAJ injection Inject 0.3 mLs (0.3 mg total) into the muscle as needed for anaphylaxis. 11/16/19   Kennith Gain, MD  furosemide (LASIX) 20 MG tablet Take 20 mg by mouth daily. 02/02/20   [provider]  metFORMIN (GLUCOPHAGE) 500 MG tablet Take 250 mg by mouth 2 (two) times daily as needed (for elevated BGL).    [provider]  methocarbamol (ROBAXIN) 500 MG tablet Take 1.5 tablets (750 mg total) by mouth every 6 (six) hours as needed for up to 30 doses for muscle spasms (Neck Stiffness). 07/14/21   Antonieta Pert, MD  montelukast (SINGULAIR) 10 MG tablet Take 1 tablet (10 mg total) by mouth at bedtime. 08/28/20   Garnet Sierras, DO      Allergies    Codeine, Lipitor [atorvastatin], and Pravachol [pravastatin sodium]    Review of Systems   Review of Systems  Constitutional:  Negative for fever.  HENT:  Negative for ear pain and sore throat.   Eyes:  Negative for pain.  Respiratory:  Positive for cough.   Cardiovascular:  Negative for chest pain.  Gastrointestinal:  Negative for abdominal pain.  Genitourinary:  Negative for flank pain.  Musculoskeletal:  Negative for back pain.  Skin:  Negative for color change and rash.  Neurological:  Negative  for syncope.  All other systems reviewed and are negative.  Physical Exam Updated Vital Signs BP 132/72 (BP Location: Left Arm)   Pulse 70   Temp 97.6 F (36.4 C) (Oral)   Resp 20   Ht '5\' 6"'$  (1.676 m)   Wt 87.1 kg   SpO2 94%   BMI 30.99 kg/m  Physical Exam Constitutional:      Appearance: He is well-developed.  HENT:     Head: Normocephalic.     Nose: Nose normal.  Eyes:     Extraocular Movements: Extraocular movements intact.  Neck:     Comments: Neck is held in a 25 degree forward flexion, chin down position.  He states it hurts to try to straighten out his head.  Limited ability to turn his head left and right due to pain. Cardiovascular:  Rate and Rhythm: Tachycardia present.  Pulmonary:     Comments: Questionable mild wheezes bilaterally.  Decreased breath sounds bilaterally. Skin:    Coloration: Skin is not jaundiced.  Neurological:     General: No focal deficit present.     Mental Status: He is alert and oriented to person, place, and time. Mental status is at baseline.    ED Results / Procedures / Treatments   Labs (all labs ordered are listed, but only abnormal results are displayed) Labs Reviewed  CBC WITH DIFFERENTIAL/PLATELET - Abnormal; Notable for the following components:      Result Value   Monocytes Absolute 1.4 (*)    All other components within normal limits  BASIC METABOLIC PANEL - Abnormal; Notable for the following components:   Glucose, Bld 116 (*)    All other components within normal limits  URINALYSIS, ROUTINE W REFLEX MICROSCOPIC - Abnormal; Notable for the following components:   Ketones, ur 5 (*)    Protein, ur 30 (*)    All other components within normal limits  BASIC METABOLIC PANEL - Abnormal; Notable for the following components:   Sodium 134 (*)    Potassium 6.2 (*)    Glucose, Bld 182 (*)    All other components within normal limits  GLUCOSE, CAPILLARY - Abnormal; Notable for the following components:   Glucose-Capillary  185 (*)    All other components within normal limits  GLUCOSE, CAPILLARY - Abnormal; Notable for the following components:   Glucose-Capillary 229 (*)    All other components within normal limits  GLUCOSE, CAPILLARY - Abnormal; Notable for the following components:   Glucose-Capillary 146 (*)    All other components within normal limits  GLUCOSE, CAPILLARY - Abnormal; Notable for the following components:   Glucose-Capillary 164 (*)    All other components within normal limits  GLUCOSE, CAPILLARY - Abnormal; Notable for the following components:   Glucose-Capillary 192 (*)    All other components within normal limits  GLUCOSE, CAPILLARY - Abnormal; Notable for the following components:   Glucose-Capillary 136 (*)    All other components within normal limits  GLUCOSE, CAPILLARY - Abnormal; Notable for the following components:   Glucose-Capillary 139 (*)    All other components within normal limits  D-DIMER, QUANTITATIVE - Abnormal; Notable for the following components:   D-Dimer, Quant 0.88 (*)    All other components within normal limits  BASIC METABOLIC PANEL - Abnormal; Notable for the following components:   Glucose, Bld 164 (*)    BUN 44 (*)    Calcium 10.5 (*)    All other components within normal limits  GLUCOSE, CAPILLARY - Abnormal; Notable for the following components:   Glucose-Capillary 150 (*)    All other components within normal limits  CULTURE, BLOOD (ROUTINE X 2)  CULTURE, BLOOD (ROUTINE X 2)  RESP PANEL BY RT-PCR (FLU A&B, COVID) ARPGX2  RESPIRATORY PANEL BY PCR  LACTIC ACID, PLASMA  LACTIC ACID, PLASMA  BRAIN NATRIURETIC PEPTIDE  CBC  POTASSIUM  BRAIN NATRIURETIC PEPTIDE    EKG None  Radiology No results found.  Procedures .Critical Care Performed by: Luna Fuse, MD Authorized by: Luna Fuse, MD   Critical care provider statement:    Critical care time (minutes):  30   Critical care time was exclusive of:  Separately billable  procedures and treating other patients and teaching time   Critical care was necessary to treat or prevent imminent or life-threatening deterioration of the following conditions:  Respiratory failure    Medications Ordered in ED Medications  amLODipine (NORVASC) tablet 10 mg (10 mg Oral Given 07/12/21 1828)  ipratropium-albuterol (DUONEB) 0.5-2.5 (3) MG/3ML nebulizer solution 3 mL (3 mLs Nebulization Given 07/12/21 1841)  methylPREDNISolone sodium succinate (SOLU-MEDROL) 125 mg/2 mL injection 125 mg (125 mg Intravenous Given 07/12/21 1829)  albuterol (PROVENTIL) (2.5 MG/3ML) 0.083% nebulizer solution 2.5 mg (2.5 mg Nebulization Given 07/12/21 1934)  cefTRIAXone (ROCEPHIN) 1 g in sodium chloride 0.9 % 100 mL IVPB (1 g Intravenous New Bag/Given 07/12/21 2031)  tiZANidine (ZANAFLEX) tablet 2 mg (2 mg Oral Given 07/13/21 1742)  technetium albumin aggregated (MAA) injection solution 4.2 millicurie (4.2 millicuries Intravenous Contrast Given 07/14/21 1610)    ED Course/ Medical Decision Making/ A&P                           Medical Decision Making Amount and/or Complexity of Data Reviewed Labs: ordered. Radiology: ordered.  Risk Prescription drug management. Decision regarding hospitalization.   History obtained from daughter at bedside as well.  Cardiac monitoring shows sinus tachycardia.  Chart review shows urgent care visit earlier today.  Work-up includes labs.  White count 10 hemoglobin 15 chemistry unremarkable.  Viral panel negative.  Lactic acid 1.0.  Blood culture sent and pending.  Still pending urinalysis.  Chest x-ray unremarkable.  CT C-spine concerning for diffuse arthritic changes but no fracture or dislocation noted.  Patient given Solu-Medrol and breathing treatments without significant improvement.  Continues to sat about 88 to 91% on room air which is low.  Placed on 2 L nasal cannula support.  Placed on soft c-collar for support as well.  Given his hypoxemia and fevers,  admitted to the hospitalist team.        Final Clinical Impression(s) / ED Diagnoses Final diagnoses:  COPD exacerbation ([place])  Hypoxemia    Rx / DC Orders ED Discharge Orders          Ordered    predniSONE (DELTASONE) 20 MG tablet  Daily with breakfast        07/14/21 1719    azithromycin (ZITHROMAX) 250 MG tablet  Daily        07/14/21 1719    Increase activity slowly        07/14/21 1719    Discharge instructions       Comments: Follow-up with PCP in 1 week  Please call call MD or return to ER for similar or worsening recurring problem that brought you to hospital or if any fever,nausea/vomiting,abdominal pain, uncontrolled pain, chest pain,  shortness of breath or any other alarming symptoms.  Please follow-up your doctor as instructed in a week time and call the office for appointment.  Please avoid alcohol, smoking, or any other illicit substance and maintain healthy habits including taking your regular medications as prescribed.  You were cared for by a hospitalist during your hospital stay. If you have any questions about your discharge medications or the care you received while you were in the hospital after you are discharged, you can call the unit and ask to speak with the hospitalist on call if the hospitalist that took care of you is not available.  Once you are discharged, your primary care physician will handle any further medical issues. Please note that NO REFILLS for any discharge medications will be authorized once you are discharged, as it is imperative that you return to your primary care physician (or establish a relationship with  a primary care physician if you do not have one) for your aftercare needs so that they can reassess your need for medications and monitor your lab values   07/14/21 1719    methocarbamol (ROBAXIN) 500 MG tablet  Every 6 hours PRN        07/14/21 1833              Luna Fuse, MD 07/25/21 2153

## 2021-07-12 NOTE — Discharge Instructions (Addendum)
Sent to hospital via EMS. ?

## 2021-07-13 ENCOUNTER — Other Ambulatory Visit: Payer: Self-pay

## 2021-07-13 ENCOUNTER — Encounter (HOSPITAL_COMMUNITY): Payer: Self-pay | Admitting: Internal Medicine

## 2021-07-13 DIAGNOSIS — Z833 Family history of diabetes mellitus: Secondary | ICD-10-CM | POA: Diagnosis not present

## 2021-07-13 DIAGNOSIS — R7989 Other specified abnormal findings of blood chemistry: Secondary | ICD-10-CM | POA: Diagnosis not present

## 2021-07-13 DIAGNOSIS — M436 Torticollis: Secondary | ICD-10-CM | POA: Diagnosis present

## 2021-07-13 DIAGNOSIS — R0902 Hypoxemia: Secondary | ICD-10-CM | POA: Diagnosis not present

## 2021-07-13 DIAGNOSIS — R000 Tachycardia, unspecified: Secondary | ICD-10-CM | POA: Diagnosis not present

## 2021-07-13 DIAGNOSIS — R918 Other nonspecific abnormal finding of lung field: Secondary | ICD-10-CM | POA: Diagnosis not present

## 2021-07-13 DIAGNOSIS — Z885 Allergy status to narcotic agent status: Secondary | ICD-10-CM | POA: Diagnosis not present

## 2021-07-13 DIAGNOSIS — M4802 Spinal stenosis, cervical region: Secondary | ICD-10-CM | POA: Diagnosis present

## 2021-07-13 DIAGNOSIS — J441 Chronic obstructive pulmonary disease with (acute) exacerbation: Secondary | ICD-10-CM | POA: Diagnosis present

## 2021-07-13 DIAGNOSIS — E669 Obesity, unspecified: Secondary | ICD-10-CM | POA: Diagnosis present

## 2021-07-13 DIAGNOSIS — Z7982 Long term (current) use of aspirin: Secondary | ICD-10-CM | POA: Diagnosis not present

## 2021-07-13 DIAGNOSIS — E785 Hyperlipidemia, unspecified: Secondary | ICD-10-CM | POA: Diagnosis present

## 2021-07-13 DIAGNOSIS — I10 Essential (primary) hypertension: Secondary | ICD-10-CM | POA: Diagnosis not present

## 2021-07-13 DIAGNOSIS — I119 Hypertensive heart disease without heart failure: Secondary | ICD-10-CM | POA: Diagnosis present

## 2021-07-13 DIAGNOSIS — Z9989 Dependence on other enabling machines and devices: Secondary | ICD-10-CM | POA: Diagnosis not present

## 2021-07-13 DIAGNOSIS — E1165 Type 2 diabetes mellitus with hyperglycemia: Secondary | ICD-10-CM | POA: Diagnosis not present

## 2021-07-13 DIAGNOSIS — Z20822 Contact with and (suspected) exposure to covid-19: Secondary | ICD-10-CM | POA: Diagnosis present

## 2021-07-13 DIAGNOSIS — K219 Gastro-esophageal reflux disease without esophagitis: Secondary | ICD-10-CM | POA: Diagnosis present

## 2021-07-13 DIAGNOSIS — Z8249 Family history of ischemic heart disease and other diseases of the circulatory system: Secondary | ICD-10-CM | POA: Diagnosis not present

## 2021-07-13 DIAGNOSIS — Z87891 Personal history of nicotine dependence: Secondary | ICD-10-CM | POA: Diagnosis not present

## 2021-07-13 DIAGNOSIS — G4733 Obstructive sleep apnea (adult) (pediatric): Secondary | ICD-10-CM | POA: Diagnosis present

## 2021-07-13 DIAGNOSIS — E119 Type 2 diabetes mellitus without complications: Secondary | ICD-10-CM | POA: Diagnosis not present

## 2021-07-13 DIAGNOSIS — Z888 Allergy status to other drugs, medicaments and biological substances status: Secondary | ICD-10-CM | POA: Diagnosis not present

## 2021-07-13 DIAGNOSIS — Z823 Family history of stroke: Secondary | ICD-10-CM | POA: Diagnosis not present

## 2021-07-13 DIAGNOSIS — M542 Cervicalgia: Secondary | ICD-10-CM | POA: Diagnosis present

## 2021-07-13 DIAGNOSIS — E1151 Type 2 diabetes mellitus with diabetic peripheral angiopathy without gangrene: Secondary | ICD-10-CM | POA: Diagnosis present

## 2021-07-13 DIAGNOSIS — Z79899 Other long term (current) drug therapy: Secondary | ICD-10-CM | POA: Diagnosis not present

## 2021-07-13 DIAGNOSIS — J9601 Acute respiratory failure with hypoxia: Secondary | ICD-10-CM | POA: Diagnosis present

## 2021-07-13 DIAGNOSIS — I517 Cardiomegaly: Secondary | ICD-10-CM | POA: Diagnosis not present

## 2021-07-13 DIAGNOSIS — J45901 Unspecified asthma with (acute) exacerbation: Secondary | ICD-10-CM | POA: Diagnosis present

## 2021-07-13 DIAGNOSIS — Z825 Family history of asthma and other chronic lower respiratory diseases: Secondary | ICD-10-CM | POA: Diagnosis not present

## 2021-07-13 DIAGNOSIS — Z8701 Personal history of pneumonia (recurrent): Secondary | ICD-10-CM | POA: Diagnosis not present

## 2021-07-13 DIAGNOSIS — F4310 Post-traumatic stress disorder, unspecified: Secondary | ICD-10-CM | POA: Diagnosis present

## 2021-07-13 DIAGNOSIS — J9621 Acute and chronic respiratory failure with hypoxia: Secondary | ICD-10-CM | POA: Insufficient documentation

## 2021-07-13 LAB — RESPIRATORY PANEL BY PCR

## 2021-07-13 LAB — CBC
HCT: 45.7 % (ref 39.0–52.0)
Hemoglobin: 14.9 g/dL (ref 13.0–17.0)
MCH: 30.5 pg (ref 26.0–34.0)
MCHC: 32.6 g/dL (ref 30.0–36.0)
MCV: 93.5 fL (ref 80.0–100.0)
Platelets: 193 10*3/uL (ref 150–400)
RBC: 4.89 MIL/uL (ref 4.22–5.81)
RDW: 13.7 % (ref 11.5–15.5)
WBC: 8.6 10*3/uL (ref 4.0–10.5)
nRBC: 0 % (ref 0.0–0.2)

## 2021-07-13 LAB — BASIC METABOLIC PANEL
Anion gap: 11 (ref 5–15)
BUN: 20 mg/dL (ref 8–23)
CO2: 23 mmol/L (ref 22–32)
Calcium: 10.2 mg/dL (ref 8.9–10.3)
Chloride: 100 mmol/L (ref 98–111)
Creatinine, Ser: 1.06 mg/dL (ref 0.61–1.24)
GFR, Estimated: >60 mL/min (ref >60)
Glucose, Bld: 182 mg/dL — ABNORMAL HIGH (ref 70–99)
Potassium: 6.2 mmol/L — ABNORMAL HIGH (ref 3.5–5.1)
Sodium: 134 mmol/L — ABNORMAL LOW (ref 135–145)

## 2021-07-13 LAB — POTASSIUM: Potassium: 3.9 mmol/L (ref 3.5–5.1)

## 2021-07-13 LAB — GLUCOSE, CAPILLARY
Glucose-Capillary: 229 mg/dL — ABNORMAL HIGH (ref 70–99)
Glucose-Capillary: 146 mg/dL — ABNORMAL HIGH (ref 70–99)
Glucose-Capillary: 164 mg/dL — ABNORMAL HIGH (ref 70–99)
Glucose-Capillary: 192 mg/dL — ABNORMAL HIGH (ref 70–99)

## 2021-07-13 MED ORDER — ENSURE ENLIVE PO LIQD
237.0000 mL | Freq: Two times a day (BID) | ORAL | Status: DC
  Administered 2021-07-13 – 2021-07-14 (×3): 237 mL via ORAL

## 2021-07-13 MED ORDER — IPRATROPIUM-ALBUTEROL 0.5-2.5 (3) MG/3ML IN SOLN: 3.0000 mL | Freq: Two times a day (BID) | RESPIRATORY_TRACT | Status: DC

## 2021-07-13 MED ORDER — TIZANIDINE HCL 4 MG PO TABS
2.0000 mg | ORAL_TABLET | Freq: Once | ORAL | Status: AC
  Administered 2021-07-13: 2 mg via ORAL
  Filled 2021-07-13: qty 1

## 2021-07-13 MED ORDER — ASPIRIN 325 MG PO TABS
325.0000 mg | ORAL_TABLET | Freq: Every day | ORAL | Status: DC
Start: 2021-07-13 — End: 2021-07-14
  Administered 2021-07-13 – 2021-07-14 (×2): 325 mg via ORAL
  Filled 2021-07-13 (×2): qty 1

## 2021-07-13 MED ORDER — AZITHROMYCIN 250 MG PO TABS
250.0000 mg | ORAL_TABLET | Freq: Every day | ORAL | Status: DC
  Administered 2021-07-13 – 2021-07-14 (×2): 250 mg via ORAL
  Filled 2021-07-13 (×2): qty 1

## 2021-07-13 MED ORDER — METFORMIN HCL ER 500 MG PO TB24
500.0000 mg | ORAL_TABLET | Freq: Two times a day (BID) | ORAL | Status: DC
  Administered 2021-07-14: 500 mg via ORAL
  Filled 2021-07-13 (×2): qty 1

## 2021-07-13 MED ORDER — IPRATROPIUM-ALBUTEROL 0.5-2.5 (3) MG/3ML IN SOLN: 3.0000 mL | Freq: Four times a day (QID) | RESPIRATORY_TRACT | Status: DC | PRN

## 2021-07-13 NOTE — Progress Notes (Deleted)
Follow Up Note  RE: Douglas Hansen MRN: [id number] DOB: 1945-04-11 Date of Office Visit: 07/14/2021  Referring provider: Janie Morning, DO Primary care provider: Janie Morning, DO  Chief Complaint: No chief complaint on file.  History of Present Illness: I had the pleasure of seeing Douglas Hansen for a follow up visit at [facility] on 07/13/2021. He is a 76 y.o. male, who is being followed for asthma/COPD overlap syndrome on Tezspire injections, allergic rhinitis and GERD. His previous allergy office visit was on 04/14/2021 with Dr. Maudie Mercury. Today is a regular follow up visit.  Asthma-COPD overlap syndrome [facility]) Past history - Usually has issues with his breathing twice a year - in the spring and fall. Follows with cardiology and apparently also with pulmonology. Ex-smoker. Nucala not covered. On Fasenra before.  Interim history - started Tezspire on 02/13/21 and states it's helping him breathe better. He doesn't have a nebulizer at home so he was not able to start budesonide neb. Using albuterol a few times per week. No prednisone since the last visit.  Today's spirometry showed mixed obstruction and restriction. Please bring in all your medications to the next visit. Daily controller medication(s):  Use Breztri 2 puffs twice a day with spacer and rinse mouth afterwards. Continue with Singulair '10mg'$  daily at night.  Continue Tezspire injections every 4 weeks - given today.  Nebulizer machine given.  May use albuterol rescue inhaler 2 puffs or nebulizer every 4 to 6 hours as needed for shortness of breath, chest tightness, coughing, and wheezing. May use albuterol rescue inhaler 2 puffs 5 to 15 minutes prior to strenuous physical activities. Monitor frequency of use.  Get spirometry at next visit. If no improvement will add on Pulmicort neb next.    Seasonal and perennial allergic rhinitis Past history - Some mild rhino conjunctivitis symptoms. 2021 bloodwork was positive  to dust mites, grass pollen, tree pollen and ragweed pollen. Interim history - only using Singulair and Flonase. Still has symptoms.  Continue appropriate allergen avoidance measures Continue montelukast 10 mg daily at bedtime. May use over the counter antihistamines such as Zyrtec (cetirizine), Claritin (loratadine), Allegra (fexofenadine), or Xyzal (levocetirizine) daily as needed. May use azelastine nasal spray 1-2 sprays per nostril twice a day as needed for drainage May use fluticasone nasal spray, one spray per nostril twice a day.  Nasal saline lavage (NeilMed) has been recommended as needed and prior to medicated nasal sprays along with instructions for proper administration.    Gastroesophageal reflux disease without esophagitis Stable. Continue appropriate reflux list on modifications and omeprazole as prescribed.   Return in about 2 months (around 06/12/2021).  Assessment and Plan: Douglas Hansen is a 76 y.o. male with: No problem-specific Assessment & Plan notes found for this encounter.  No follow-ups on file.  No orders of the defined types were placed in this encounter.  Lab Orders  No laboratory test(s) ordered today    Diagnostics: Spirometry:  Tracings reviewed. His effort: {Blank single:19197::"Good reproducible efforts.","It was hard to get consistent efforts and there is a question as to whether this reflects a maximal maneuver.","Poor effort, data can not be interpreted."} FVC: ***L FEV1: ***L, ***% predicted FEV1/FVC ratio: ***% Interpretation: {Blank single:19197::"Spirometry consistent with mild obstructive disease","Spirometry consistent with moderate obstructive disease","Spirometry consistent with severe obstructive disease","Spirometry consistent with possible restrictive disease","Spirometry consistent with mixed obstructive and restrictive disease","Spirometry uninterpretable due to technique","Spirometry consistent with normal pattern","No overt abnormalities  noted given today's efforts"}.  Please see scanned  spirometry results for details.  Skin Testing: {Blank single:19197::"Select foods","Environmental allergy panel","Environmental allergy panel and select foods","Food allergy panel","None","Deferred due to recent antihistamines use"}. *** Results discussed with patient/family.   Medication List:  No current facility-administered medications for this visit.   No current outpatient medications on file.   Facility-Administered Medications Ordered in Other Visits  Medication Dose Route Frequency Provider Last Rate Last Admin  . acetaminophen (TYLENOL) tablet 650 mg  650 mg Oral Q6H PRN Marcelyn Bruins, MD   650 mg at 07/12/21 2154   Or  . acetaminophen (TYLENOL) suppository 650 mg  650 mg Rectal Q6H PRN Marcelyn Bruins, MD      . amLODipine (NORVASC) tablet 10 mg  10 mg Oral Daily Marcelyn Bruins, MD   10 mg at 07/13/21 0901  . aspirin tablet 325 mg  325 mg Oral Daily Guilford Shi, MD   325 mg at 07/13/21 1742  . azithromycin (ZITHROMAX) tablet 250 mg  250 mg Oral Daily Guilford Shi, MD   250 mg at 07/13/21 1742  . carvedilol (COREG) tablet 6.25 mg  6.25 mg Oral BID WC Marcelyn Bruins, MD   6.25 mg at 07/13/21 1742  . enoxaparin (LOVENOX) injection 40 mg  40 mg Subcutaneous Q24H Marcelyn Bruins, MD   40 mg at 07/13/21 2150  . feeding supplement (ENSURE ENLIVE / ENSURE PLUS) liquid 237 mL  237 mL Oral BID BM Marcelyn Bruins, MD   237 mL at 07/13/21 1545  . furosemide (LASIX) tablet 20 mg  20 mg Oral Daily Marcelyn Bruins, MD   20 mg at 07/13/21 0901  . insulin aspart (novoLOG) injection 0-15 Units  0-15 Units Subcutaneous TID WC Marcelyn Bruins, MD   3 Units at 07/13/21 1744  . ipratropium-albuterol (DUONEB) 0.5-2.5 (3) MG/3ML nebulizer solution 3 mL  3 mL Nebulization Q6H PRN Guilford Shi, MD      . Derrill Memo ON 07/14/2021] metFORMIN (GLUCOPHAGE-XR) 24 hr tablet 500 mg  500 mg Oral BID WC Kamineni,  Neelima, MD      . methocarbamol (ROBAXIN) tablet 750 mg  750 mg Oral Q6H PRN Marcelyn Bruins, MD   750 mg at 07/13/21 2147  . montelukast (SINGULAIR) tablet 10 mg  10 mg Oral QHS Marcelyn Bruins, MD   10 mg at 07/13/21 2150  . polyethylene glycol (MIRALAX / GLYCOLAX) packet 17 g  17 g Oral Daily PRN Marcelyn Bruins, MD      . predniSONE (DELTASONE) tablet 40 mg  40 mg Oral Q breakfast Marcelyn Bruins, MD   40 mg at 07/13/21 0901  . sertraline (ZOLOFT) tablet 100 mg  100 mg Oral Daily Marcelyn Bruins, MD   100 mg at 07/13/21 0901  . sodium chloride flush (NS) 0.9 % injection 3 mL  3 mL Intravenous Q12H Marcelyn Bruins, MD   3 mL at 07/13/21 4818   Allergies: Allergies  Allergen Reactions  . Codeine Nausea Only and Other (See Comments)    GI upset  . Lipitor [Atorvastatin] Other (See Comments)    "Made my stomach burn"  . Pravachol [Pravastatin Sodium] Other (See Comments)    Caused joint pain   I reviewed his past medical history, social history, family history, and environmental history and no significant changes have been reported from his previous visit.  Review of Systems  Constitutional:  Negative for appetite change, chills, fever and unexpected weight change.  HENT:  Positive for congestion and rhinorrhea.  Eyes:  Negative for itching.  Respiratory:  Positive for shortness of breath. Negative for cough, chest tightness and wheezing.   Gastrointestinal:  Negative for abdominal pain.  Skin:  Negative for rash.  Allergic/Immunologic: Positive for environmental allergies.  Neurological:  Negative for headaches.   Objective: There were no vitals taken for this visit. There is no height or weight on file to calculate BMI. Physical Exam Vitals and nursing note reviewed.  Constitutional:      Appearance: Normal appearance. He is well-developed.  HENT:     Head: Normocephalic and atraumatic.     Right Ear: Tympanic membrane and external ear normal.      Left Ear: Tympanic membrane and external ear normal.     Nose: Nose normal.     Mouth/Throat:     Mouth: Mucous membranes are moist.     Pharynx: Oropharynx is clear.  Eyes:     Conjunctiva/sclera: Conjunctivae normal.  Cardiovascular:     Rate and Rhythm: Normal rate and regular rhythm.     Heart sounds: Normal heart sounds. No murmur heard. Pulmonary:     Effort: Pulmonary effort is normal.     Breath sounds: No wheezing, rhonchi or rales.     Comments: Decreased breath sounds throughout. Musculoskeletal:     Cervical back: Neck supple.  Skin:    General: Skin is warm.     Findings: No rash.  Neurological:     Mental Status: He is alert and oriented to person, place, and time.  Psychiatric:        Behavior: Behavior normal.  Previous notes and tests were reviewed. The plan was reviewed with the patient/family, and all questions/concerned were addressed.  It was my pleasure to see Alexsis today and participate in his care. Please feel free to contact me with any questions or concerns.  Sincerely,  Rexene Alberts, DO Allergy & Immunology  [facility] office: [place] office: [phone]

## 2021-07-13 NOTE — Progress Notes (Signed)
SATURATION QUALIFICATIONS: (This note is used to comply with regulatory documentation for home oxygen) ? ?Patient Saturations on Room Air at Rest = 92% ? ?Patient Saturations on Room Air while Ambulating = 87% ? ?Patient Saturations on 2 Liters of oxygen while Ambulating = 95% ? ?Please briefly explain why patient needs home oxygen:  Pt did desat to 87 percent on room air while ambulating.  Pt up to 95% on 2L via nasal cannula while ambulating. ?

## 2021-07-13 NOTE — Progress Notes (Incomplete)
Admit date:07/12/2021 ?Chief compliant: neck pain, noted to be hypoxic ?76 y.o. male with medical history significant of asthma, COPD, OSA on CPAP, diabetes, hyperlipidemia, hypertension, GERD, claudication, PTSD  presenting initially for neck pain for the past couple of days.  Went to urgent care to get checked out and was found to be hypoxic to the mid 80s on room air.  Sent to the ED for further evaluation.He reports having neck stiffness for the past couple days he holds his head flexed and has difficulty straightening it out.  Denies chronic neck pain.  Denies significant URI symptoms but states he does have a chronic cough that may have been worse the past couple days.  Denies fever. ?ED Course:  fever to 101F,  tachycardic 90s to 100s, SBP 160s to 190s,  hypoxic to the mid to upper 80s on room air, saturating well on 2 L [place]. Lab work-up included BMP with glucose 116.  CBC within normal limits.  Lactic acid normal.  BNP 32.   Respiratory panel for flu and COVID-negative. CXR without acute abnormality.  CT C-spine showing multiple level degenerative changes but no acute changes.  Patient received Solu-Medrol, DuoNeb, albuterol as well as amlodipine and irbesartan in the ED. ? Hospital course: Patient admitted to [facility] for Acute hypoxic respiratory failure due to COPD exacerbation and neck pain due to cervical spondylosis-bilateral paraspinal neck muscle tightness on exam.CT imaging did show chronic multilevel degenerative changes with no acute abnormality. Placed on soft collar, Robaxin PRN. Admitted with prednisone, duonebs and O2. Patient reports non compliance with CPAP at home and refused to wear it here overnight. Resumed on home antihypertensives. AM labs show elevated Potassium.  ? ?Assessment/Plan: ? ? ? ?DVT prophylaxis:  ?Code Status:  ?Family / Patient Communication:  ?Disposition Plan:   ?Status is: Observation ?{Observation:23811} ?  ?

## 2021-07-13 NOTE — Progress Notes (Signed)
Nurse informed by tech that pt's IV had come out.  Pt taking oral medication and eating well.  It is ok to leave pt's IV out per Dr. Earnest Conroy at this time. ?

## 2021-07-13 NOTE — Progress Notes (Signed)
The PROGRESS NOTE    Douglas Hansen  FTD:[id number]  DOB: May 03, 1945  PCP: Janie Morning, DO Admit date:07/12/2021 Chief compliant: neck pain, noted to be hypoxic 76 y.o. male with medical history significant of asthma, COPD, OSA on CPAP, diabetes, hyperlipidemia, hypertension, GERD, claudication, PTSD  presenting initially for neck pain for the past couple of days.  Went to urgent care to get checked out and was found to be hypoxic to the mid 80s on room air.  Sent to the ED for further evaluation.He reports having neck stiffness for the past couple days he holds his head flexed and has difficulty straightening it out.  Denies chronic neck pain.  Denies significant URI symptoms but states he does have a chronic cough that may have been worse the past couple days.  Denies fever. ED Course:  fever to 101F,  tachycardic 90s to 100s, SBP 160s to 190s,  hypoxic to the mid to upper 80s on room air, saturating well on 2 L [place]. Lab work-up included BMP with glucose 116.  CBC within normal limits.  Lactic acid normal.  BNP 32.   Respiratory panel for flu and COVID-negative. CXR without acute abnormality.  CT C-spine showing multiple level degenerative changes but no acute changes.  Patient received Solu-Medrol, DuoNeb, albuterol as well as amlodipine and irbesartan in the ED.  Hospital course: Patient admitted to [facility] for Acute hypoxic respiratory failure due to COPD exacerbation and neck pain due to cervical spondylosis-bilateral paraspinal neck muscle tightness on exam.CT imaging did show chronic multilevel degenerative changes with no acute abnormality. Placed on soft collar, Robaxin PRN. Admitted with prednisone, duonebs and O2. Patient reports non compliance with CPAP at home and refused to wear it here overnight. Resumed on home antihypertensives. AM labs show elevated Potassium.    Subjective: Patient resting in bed comfortably.  Afebrile this morning.  Although feels improved since admission, he does report  persistent dyspnea with minimal exertion.  He does not feel he is back to his baseline respiratory status and still has cough/SOB.  Viral panel, COVID negative.  He does report improvement in neck pain.  Lab reported potassium level at 6.2 but repeat level was within normal limits (likely hemolyzed sample)   Objective: Vitals:   07/13/21 0114 07/13/21 0537 07/13/21 0839 07/13/21 1447  BP: 135/78 (!) 145/74  140/68  Pulse: 80 76  80  Resp: 19 19    Temp: 98.7 F (37.1 C) (!) 97.5 F (36.4 C)  98.5 F (36.9 C)  TempSrc:    Oral  SpO2: 97% 100% 99% 97%  Weight:      Height:        Intake/Output Summary (Last 24 hours) at 07/13/2021 1701 Last data filed at 07/13/2021 0300 Gross per 24 hour  Intake 0 ml  Output --  Net 0 ml   Filed Weights   07/12/21 1622  Weight: 87.1 kg    Physical Examination: General: Moderately built, mild distress noted on talking full sentences Head ENT: Atraumatic normocephalic, PERRLA, neck supple Heart: S1-S2 heard, regular rate and rhythm, no murmurs.  No leg edema noted Lungs: Equal air entry bilaterally, mild scattered rhonchi, no rales on exam, no accessory muscle use Abdomen: Bowel sounds heard, soft, nontender, nondistended. No organomegaly.  No CVA tenderness Extremities: No pedal edema.  No cyanosis or clubbing. Neurological: Awake alert oriented x3, no focal weakness or numbness, strength and sensations to crude touch intact Skin: No wounds or rashes.     Data Reviewed: I  have personally reviewed following labs and imaging studies  CBC: Recent Labs  Lab 07/12/21 1640 07/13/21 0514  WBC 9.9 8.6  NEUTROABS 6.5  --   HGB 15.3 14.9  HCT 47.1 45.7  MCV 94.2 93.5  PLT 191 578   Basic Metabolic Panel: Recent Labs  Lab 07/12/21 1640 07/13/21 0514 07/13/21 0741  NA 136 134*  --   K 4.0 6.2* 3.9  CL 102 100  --   CO2 27 23  --   GLUCOSE 116* 182*  --   BUN 15 20  --   CREATININE 0.95 1.06  --   CALCIUM 10.2 10.2  --     GFR: Estimated Creatinine Clearance: 59.4 mL/min (by C-G formula based on SCr of 1.06 mg/dL). Liver Function Tests: No results for input(s): AST, ALT, ALKPHOS, BILITOT, PROT, ALBUMIN in the last 168 hours. No results for input(s): LIPASE, AMYLASE in the last 168 hours. No results for input(s): AMMONIA in the last 168 hours. Coagulation Profile: No results for input(s): INR, PROTIME in the last 168 hours. Cardiac Enzymes: No results for input(s): CKTOTAL, CKMB, CKMBINDEX, TROPONINI in the last 168 hours. BNP (last 3 results) No results for input(s): PROBNP in the last 8760 hours. HbA1C: No results for input(s): HGBA1C in the last 72 hours. CBG: Recent Labs  Lab 07/12/21 2112 07/13/21 0850 07/13/21 1304  GLUCAP 185* 229* 146*   Lipid Profile: No results for input(s): CHOL, HDL, LDLCALC, TRIG, CHOLHDL, LDLDIRECT in the last 72 hours. Thyroid Function Tests: No results for input(s): TSH, T4TOTAL, FREET4, T3FREE, THYROIDAB in the last 72 hours. Anemia Panel: No results for input(s): VITAMINB12, FOLATE, FERRITIN, TIBC, IRON, RETICCTPCT in the last 72 hours. Sepsis Labs: Recent Labs  Lab 07/12/21 1640 07/12/21 2141  LATICACIDVEN 1.0 1.5    Recent Results (from the past 240 hour(s))  Resp Panel by RT-PCR (Flu A&B, Covid) Nasopharyngeal Swab     Status: None   Collection Time: 07/12/21  5:29 PM   Specimen: Nasopharyngeal Swab; Nasopharyngeal(NP) swabs in vial transport medium  Result Value Ref Range Status   SARS Coronavirus 2 by RT PCR NEGATIVE NEGATIVE Final    Comment: (NOTE) SARS-CoV-2 target nucleic acids are NOT DETECTED.  The SARS-CoV-2 RNA is generally detectable in upper respiratory specimens during the acute phase of infection. The lowest concentration of SARS-CoV-2 viral copies this assay can detect is 138 copies/mL. A negative result does not preclude SARS-Cov-2 infection and should not be used as the sole basis for treatment or other patient management  decisions. A negative result may occur with  improper specimen collection/handling, submission of specimen other than nasopharyngeal swab, presence of viral mutation(s) within the areas targeted by this assay, and inadequate number of viral copies(<138 copies/mL). A negative result must be combined with clinical observations, patient history, and epidemiological information. The expected result is Negative.  Fact Sheet for Patients:  EntrepreneurPulse.com.au  Fact Sheet for Healthcare Providers:  IncredibleEmployment.be  This test is no t yet approved or cleared by the [place] FDA and  has been authorized for detection and/or diagnosis of SARS-CoV-2 by FDA under an Emergency Use Authorization (EUA). This EUA will remain  in effect (meaning this test can be used) for the duration of the COVID-19 declaration under Section 564(b)(1) of the Act, 21 U.S.C.section 360bbb-3(b)(1), unless the authorization is terminated  or revoked sooner.       Influenza A by PCR NEGATIVE NEGATIVE Final   Influenza B by PCR NEGATIVE NEGATIVE Final  Comment: (NOTE) The Xpert Xpress SARS-CoV-2/FLU/RSV plus assay is intended as an aid in the diagnosis of influenza from Nasopharyngeal swab specimens and should not be used as a sole basis for treatment. Nasal washings and aspirates are unacceptable for Xpert Xpress SARS-CoV-2/FLU/RSV testing.  Fact Sheet for Patients: EntrepreneurPulse.com.au  Fact Sheet for Healthcare Providers: IncredibleEmployment.be  This test is not yet approved or cleared by the [place] FDA and has been authorized for detection and/or diagnosis of SARS-CoV-2 by FDA under an Emergency Use Authorization (EUA). This EUA will remain in effect (meaning this test can be used) for the duration of the COVID-19 declaration under Section 564(b)(1) of the Act, 21 U.S.C. section 360bbb-3(b)(1), unless the  authorization is terminated or revoked.  Performed at [facility], [place] [address], [place], [place] [address]   Respiratory (~20 pathogens) panel by PCR     Status: None   Collection Time: 07/12/21  5:29 PM   Specimen: Nasopharyngeal Swab; Respiratory  Result Value Ref Range Status   Adenovirus NOT DETECTED NOT DETECTED Final   Coronavirus 229E NOT DETECTED NOT DETECTED Final    Comment: (NOTE) The Coronavirus on the Respiratory Panel, DOES NOT test for the novel  Coronavirus (2019 nCoV)    Coronavirus HKU1 NOT DETECTED NOT DETECTED Final   Coronavirus NL63 NOT DETECTED NOT DETECTED Final   Coronavirus OC43 NOT DETECTED NOT DETECTED Final   Metapneumovirus NOT DETECTED NOT DETECTED Final   Rhinovirus / Enterovirus NOT DETECTED NOT DETECTED Final   Influenza A NOT DETECTED NOT DETECTED Final   Influenza B NOT DETECTED NOT DETECTED Final   Parainfluenza Virus 1 NOT DETECTED NOT DETECTED Final   Parainfluenza Virus 2 NOT DETECTED NOT DETECTED Final   Parainfluenza Virus 3 NOT DETECTED NOT DETECTED Final   Parainfluenza Virus 4 NOT DETECTED NOT DETECTED Final   Respiratory Syncytial Virus NOT DETECTED NOT DETECTED Final   Bordetella pertussis NOT DETECTED NOT DETECTED Final   Bordetella Parapertussis NOT DETECTED NOT DETECTED Final   Chlamydophila pneumoniae NOT DETECTED NOT DETECTED Final   Mycoplasma pneumoniae NOT DETECTED NOT DETECTED Final    Comment: Performed at [facility], [place]. [address], [place], [place] [address]  Culture, blood (routine x 2)     Status: None (Preliminary result)   Collection Time: 07/12/21  5:41 PM   Specimen: BLOOD  Result Value Ref Range Status   Specimen Description   Final    BLOOD LEFT ANTECUBITAL Performed at [facility] [address], [place], [place] [address]    Special Requests   Final    BOTTLES DRAWN AEROBIC AND ANAEROBIC Blood Culture adequate volume Performed at [facility] [address], [place], [place] [address]    Culture   Final    NO GROWTH < 24 HOURS Performed at [facility] [address] [place],  [address]    Report Status PENDING  Incomplete  Culture, blood (routine x 2)     Status: None (Preliminary result)   Collection Time: 07/12/21  9:40 PM   Specimen: BLOOD  Result Value Ref Range Status   Specimen Description   Final    BLOOD RIGHT HAND Performed at [facility] [address] [place],  [address]    Special Requests   Final    BOTTLES DRAWN AEROBIC AND ANAEROBIC Blood Culture adequate volume Performed at [facility] [address], [place],  [address]  Culture   Final    NO GROWTH < 12 HOURS Performed at [facility], [place] [address], [place], [place] [address]    Report Status PENDING  Incomplete      Radiology Studies: CT Cervical Spine Wo Contrast  Result Date: 07/12/2021 CLINICAL DATA:  Neck pain. EXAM: CT CERVICAL SPINE WITHOUT CONTRAST TECHNIQUE: Multidetector CT imaging of the cervical spine was performed without intravenous contrast. Multiplanar CT image reconstructions were also generated. RADIATION DOSE REDUCTION: This exam was performed according to the departmental dose-optimization program which includes automated exposure control, adjustment of the mA and/or kV according to patient size and/or use of iterative reconstruction technique. COMPARISON:  None Available. FINDINGS: Alignment: There is reversal of the normal cervical spine lordosis. There is approximately 1 mm anterolisthesis of C3 on C4. Skull base and vertebrae: No acute fracture. No primary bone lesion or focal pathologic process. Soft tissues and spinal canal: No prevertebral fluid or swelling. No visible canal hematoma. Disc levels: Marked severity endplate sclerosis and anterior osteophyte formation are seen at the levels of C4-C5, C5-C6, C6-C7 and C7-T1. Marked severity  intervertebral disc space narrowing is seen at C3-C4, C4-C5, C5-C6, C6-C7 and C7-T1. Bilateral marked severity multilevel facet joint hypertrophy is noted. Upper chest: Negative. Other: None. IMPRESSION: 1. Marked severity multilevel degenerative changes without evidence of an acute fracture or subluxation. 2. Reversal of the normal cervical spine lordosis with approximately 1 mm anterolisthesis of C3 on C4. Electronically Signed   By: Virgina Norfolk M.D.   On: 07/12/2021 18:14   DG Chest Port 1 View  Result Date: 07/12/2021 CLINICAL DATA:  Shortness of breath EXAM: PORTABLE CHEST 1 VIEW COMPARISON:  Chest x-ray dated January 02, 2020 FINDINGS: The heart size and mediastinal contours are within normal limits. Both lungs are clear. The visualized skeletal structures are unremarkable. IMPRESSION: No active disease. Electronically Signed   By: Yetta Glassman M.D.   On: 07/12/2021 16:50      Scheduled Meds:  amLODipine  10 mg Oral Daily   carvedilol  6.25 mg Oral BID WC   enoxaparin (LOVENOX) injection  40 mg Subcutaneous Q24H   feeding supplement  237 mL Oral BID BM   furosemide  20 mg Oral Daily   insulin aspart  0-15 Units Subcutaneous TID WC   ipratropium-albuterol  3 mL Nebulization BID   montelukast  10 mg Oral QHS   predniSONE  40 mg Oral Q breakfast   sertraline  100 mg Oral Daily   sodium chloride flush  3 mL Intravenous Q12H   Continuous Infusions:  azithromycin (ZITHROMAX) 500 MG IVPB (Vial-Mate [employer])        Assessment/Plan:   1.COPD exacerbation: Continue prednisone (will change to IV Solu-Medrol) and supportive neb treatments for another day.  Add mucolytic and antitussives.  Continue empiric antibiotic, neb treatments scheduled and as needed.  Viral panel negative.  If respiratory status back to baseline in a.m., obtain walking desat studies.  May need home O2.   2.  Neck pain: CT cervical spine unremarkable except for degenerative changes.  He states muscle relaxant  helped overnight.  Will try NSAIDs and Zanaflex as needed and see if neck pain improves further.  3. Hypertension: Resume home medications Norvasc, Coreg and furosemide.  4.  PTSD: Resume home medications  5.  Obstructive sleep apnea: Does not appear to be compliant with CPAP.  6.  Diabetes mellitus: Resume metformin and watch blood glucose levels on prednisone with sliding scale insulin.  DVT prophylaxis: Lovenox Code Status: Full code Family / Patient Communication: Discussed with patient and explained treatment plan.  All questions answered to satisfaction.   Disposition Plan: Possibly home in 24 to 48 hours with or without home O2. Status is: Observation The patient will require care spanning > 2 midnights and should be moved to inpatient because: Persistent dyspnea on exertion requiring above management and supplemental O2.   Time spent: 35 minutes     >50% time spent in discussions with care team and coordination of care.    Guilford Shi, MD [facility] Pager in [place]  If 7PM-7AM, please contact night-coverage [email or web address] 07/13/2021, 5:01 PM

## 2021-07-13 NOTE — Progress Notes (Signed)
Patient refuses CPAP 

## 2021-07-14 ENCOUNTER — Inpatient Hospital Stay (HOSPITAL_COMMUNITY): Payer: Medicare Other

## 2021-07-14 ENCOUNTER — Ambulatory Visit: Payer: Medicare Other | Admitting: Allergy

## 2021-07-14 DIAGNOSIS — J441 Chronic obstructive pulmonary disease with (acute) exacerbation: Secondary | ICD-10-CM

## 2021-07-14 DIAGNOSIS — E875 Hyperkalemia: Secondary | ICD-10-CM

## 2021-07-14 DIAGNOSIS — J9601 Acute respiratory failure with hypoxia: Secondary | ICD-10-CM | POA: Diagnosis not present

## 2021-07-14 LAB — BASIC METABOLIC PANEL
Anion gap: 7 (ref 5–15)
BUN: 44 mg/dL — ABNORMAL HIGH (ref 8–23)
CO2: 29 mmol/L (ref 22–32)
Calcium: 10.5 mg/dL — ABNORMAL HIGH (ref 8.9–10.3)
Chloride: 101 mmol/L (ref 98–111)
Creatinine, Ser: 1.06 mg/dL (ref 0.61–1.24)
GFR, Estimated: >60 mL/min (ref >60)
Glucose, Bld: 164 mg/dL — ABNORMAL HIGH (ref 70–99)
Potassium: 4.3 mmol/L (ref 3.5–5.1)
Sodium: 137 mmol/L (ref 135–145)

## 2021-07-14 LAB — GLUCOSE, CAPILLARY
Glucose-Capillary: 136 mg/dL — ABNORMAL HIGH (ref 70–99)
Glucose-Capillary: 139 mg/dL — ABNORMAL HIGH (ref 70–99)
Glucose-Capillary: 150 mg/dL — ABNORMAL HIGH (ref 70–99)

## 2021-07-14 LAB — D-DIMER, QUANTITATIVE: D-Dimer, Quant: 0.88 ug/mL-FEU — ABNORMAL HIGH (ref 0.00–0.50)

## 2021-07-14 LAB — BRAIN NATRIURETIC PEPTIDE: B Natriuretic Peptide: 35.8 pg/mL (ref 0.0–100.0)

## 2021-07-14 MED ORDER — AZITHROMYCIN 250 MG PO TABS: 250.0000 mg | ORAL_TABLET | Freq: Every day | ORAL | 0 refills | Status: AC

## 2021-07-14 MED ORDER — TECHNETIUM TO 99M ALBUMIN AGGREGATED
4.2000 | Freq: Once | INTRAVENOUS | Status: AC | PRN
  Administered 2021-07-14: 4.2 via INTRAVENOUS

## 2021-07-14 MED ORDER — PREDNISONE 20 MG PO TABS: 20.0000 mg | ORAL_TABLET | Freq: Every day | ORAL | 0 refills | Status: AC

## 2021-07-14 MED ORDER — METHOCARBAMOL 500 MG PO TABS: 750.0000 mg | ORAL_TABLET | Freq: Four times a day (QID) | ORAL | 0 refills | Status: AC | PRN

## 2021-07-14 NOTE — TOC Progression Note (Signed)
Transition of Care (TOC) - Progression Note  ? ? ?Patient Details  ?Name: Douglas Hansen ?MRN: [id number] ?Date of Birth: 25-Sep-1945 ? ?Transition of Care (TOC) CM/SW Contact  ?Leeroy Cha, RN ?Phone Number: ?07/14/2021, 2:55 PM ? ?Clinical Narrative:    ?Home 02 ordered through [facility] dme ? ? ?Expected Discharge Plan: Home/Self Care ?Barriers to Discharge: No Barriers Identified ? ?Expected Discharge Plan and Services ?Expected Discharge Plan: Home/Self Care ?  ?Discharge Planning Services: CM Consult ?Post Acute Care Choice: Durable Medical Equipment ?Living arrangements for the past 2 months: [place] ?                ?DME Arranged: Oxygen ?DME Agency: [employer] ?Date DME Agency Contacted: 07/14/21 ?Time DME Agency Contacted: [id number] ?Representative spoke with at DME Agency: [email or web address] ?  ?  ?  ?  ?  ? ? ?Social Determinants of Health (SDOH) Interventions ?  ? ?Readmission Risk Interventions ?   ? View : No data to display.  ?  ?  ?  ? ? ?

## 2021-07-14 NOTE — Hospital Course (Addendum)
58 y.om w/ asthma,COPDx 6-7 yrs,OSA on CPAP, diabetes, hyperlipidemia, hypertension, GERD, claudication, PTSD presented initially for neck pain times few days seen at the urgent care was found to be hypoxic to the mid 80s on room air and sent to ED where he was febrile 101F,tachycardic 90s to 100s, SBP 160s to 190s,  hypoxic to the mid to upper 80s on room air, saturating well on 2 L [place]. Lab work-up included BMP with glucose 116.  CBC within normal limits.  Lactic acid normal.  BNP 32.   Respiratory panel for flu and COVID-negative. CXR without acute abnormality.  CT C-spine showing multiple level degenerative changes but no acute changesPatient received Solu-Medrol, DuoNeb, albuterol as well as amlodipine and irbesartan in the ED and admitted to [facility] for acute hypoxic respiratory failure/COPD exacerbation, neck pain cervical spondylosis. He feels significantly improved, not needing oxygen at rest but needing on ambulation.  ?

## 2021-07-14 NOTE — Plan of Care (Signed)
?  Problem: Activity: ?Goal: Ability to tolerate increased activity will improve ?Outcome: Adequate for Discharge ?  ?Problem: Respiratory: ?Goal: Ability to maintain a clear airway will improve ?Outcome: Adequate for Discharge ?  ?Problem: Activity: ?Goal: Risk for activity intolerance will decrease ?Outcome: Adequate for Discharge ?  ?Problem: Nutrition: ?Goal: Adequate nutrition will be maintained ?Outcome: Adequate for Discharge ?  ?Problem: Pain Managment: ?Goal: General experience of comfort will improve ?Outcome: Adequate for Discharge ?  ?

## 2021-07-14 NOTE — Discharge Summary (Signed)
Physician Discharge Summary  Douglas Hansen JHE:[id number] DOB: 1945-04-22 DOA: 07/12/2021  PCP: Janie Morning, DO  Admit date: 07/12/2021 Discharge date: 07/14/2021 Recommendations for Outpatient Follow-up:  Follow up with PCP in 1 weeks-call for appointment Please obtain BMP/CBC in one week Cont home o2 as arranged during ambulation  Discharge Dispo:Home Discharge Condition: Stable Code Status:   Code Status: Full Code Diet recommendation:  Diet Order             Diet heart healthy/carb modified Room service appropriate? Yes; Fluid consistency: Thin  Diet effective now                    Brief/Interim Summary: 19 y.om w/ asthma,COPDx 6-7 yrs,OSA on CPAP, diabetes, hyperlipidemia, hypertension, GERD, claudication, PTSD presented initially for neck pain times few days seen at the urgent care was found to be hypoxic to the mid 80s on room air and sent to ED where he was febrile 101F,tachycardic 90s to 100s, SBP 160s to 190s,  hypoxic to the mid to upper 80s on room air, saturating well on 2 L [place]. Lab work-up included BMP with glucose 116.  CBC within normal limits.  Lactic acid normal.  BNP 32.   Respiratory panel for flu and COVID-negative. CXR without acute abnormality.  CT C-spine showing multiple level degenerative changes but no acute changesPatient received Solu-Medrol, DuoNeb, albuterol as well as amlodipine and irbesartan in the ED and admitted to [facility] for acute hypoxic respiratory failure/COPD exacerbation, neck pain cervical spondylosis. He feels significantly improved, not needing oxygen at rest but needing on ambulation.   Patient went further work-up due to elevated D-dimer and perfusion scan was negative.  Patient now at this time remains stable improved approved for home oxygen during ambulation.  He feels well enough to go home and rest when I followed up in the evening. He is being discharged in medically stable condition with instruction to follow-up with PCP later this  week.  Discharge Diagnoses:  Principal Problem:   Acute respiratory failure with hypoxia (HCC) Active Problems:   Asthma-COPD overlap syndrome (HCC)   OSA on CPAP   Diabetes mellitus (HCC)   Hyperlipemia   Essential hypertension   Gastroesophageal reflux disease without esophagitis   COPD with acute exacerbation (HCC)   Hyperkalemia   Acute respiratory failure with hypoxia COPD with acute exacerbation  Asthma-COPD overlap syndrome: Hypoxic in the ED with transient fever x1 of 101- no recurrence since then. Had  tachycardia mild- chest x-ray without acute finding had some neck pain which is improving attributed to his cervical degenerative disease.  At this time patient is afebrile no neck pain,.  On ambulation oxygen dropped to 87%.  He qualified for home oxygen due to his COPD.  TOC Given new oxygen requirement D-dimer is elevated 0.8, proceed with VQ scan -patient agreeable to further work-up.  Will need home oxygen during ambulation and is being set up.Continue on the steroid taper, bronchodilators, Singulair   Diabetes mellitus type II with hyperglycemia up to 229:Blood sugar stable, now, ssi  as inpatient. Resume metformin on discharge  Hyperlipemia: Follow-up with PCP Essential hypertension: BP controlled on amlodipine, Coreg, Lasix GERD-continue PPI Hyperkalemia-resolved OSA on CPAP   Class I Obesity:Patient's Body mass index is 30.99 kg/m. : Will benefit with PCP follow-up, weight loss  healthy lifestyle Consults: TOC Subjective: Alert awake resting comfortable, on room air at rest needing 2 L of cannula on ambulation.  He desires to go home this evening.  Discharge  Exam: Vitals:   07/14/21 0539 07/14/21 1318  BP: (!) 122/57 132/72  Pulse: 64 70  Resp: 17 20  Temp: 97.7 F (36.5 C) 97.6 F (36.4 C)  SpO2: 98% 94%   General: Pt is alert, awake, not in acute distress Cardiovascular: RRR, S1/S2 +, no rubs, no gallops Respiratory: CTA bilaterally, no wheezing, no  rhonchi Abdominal: Soft, NT, ND, bowel sounds + Extremities: no edema, no cyanosis  Discharge Instructions  Discharge Instructions     Discharge instructions   Complete by: As directed    Follow-up with PCP in 1 week  Please call call MD or return to ER for similar or worsening recurring problem that brought you to hospital or if any fever,nausea/vomiting,abdominal pain, uncontrolled pain, chest pain,  shortness of breath or any other alarming symptoms.  Please follow-up your doctor as instructed in a week time and call the office for appointment.  Please avoid alcohol, smoking, or any other illicit substance and maintain healthy habits including taking your regular medications as prescribed.  You were cared for by a hospitalist during your hospital stay. If you have any questions about your discharge medications or the care you received while you were in the hospital after you are discharged, you can call the unit and ask to speak with the hospitalist on call if the hospitalist that took care of you is not available.  Once you are discharged, your primary care physician will handle any further medical issues. Please note that NO REFILLS for any discharge medications will be authorized once you are discharged, as it is imperative that you return to your primary care physician (or establish a relationship with a primary care physician if you do not have one) for your aftercare needs so that they can reassess your need for medications and monitor your lab values   Increase activity slowly   Complete by: As directed       Allergies as of 07/14/2021       Reactions   Codeine Nausea Only, Other (See Comments)   GI upset   Lipitor [atorvastatin] Other (See Comments)   "Made my stomach burn"   Pravachol [pravastatin Sodium] Other (See Comments)   Caused joint pain        Medication List     TAKE these medications    albuterol 108 (90 Base) MCG/ACT inhaler Commonly known as:  VENTOLIN HFA INHALE 2 PUFFS BY MOUTH EVERY 4 HOURS AS NEEDED FOR WHEEZING OR SHORTNESS OF BREATH   albuterol (2.5 MG/3ML) 0.083% nebulizer solution Commonly known as: PROVENTIL Take 3 mLs (2.5 mg total) by nebulization every 4 (four) hours as needed for wheezing or shortness of breath (coughing fits).   amLODipine 10 MG tablet Commonly known as: NORVASC Take 10 mg by mouth daily.   ascorbic acid 500 MG tablet Commonly known as: VITAMIN C Take 1,000 mg by mouth daily.   aspirin EC 325 MG tablet Take 325 mg by mouth daily.   azelastine 0.1 % nasal spray Commonly known as: ASTELIN Place 1-2 sprays into both nostrils 2 (two) times daily as needed (nasal drainage). Use in each nostril as directed   azithromycin 250 MG tablet Commonly known as: ZITHROMAX Take 1 tablet (250 mg total) by mouth daily for 4 days.   Breztri Aerosphere 160-9-4.8 MCG/ACT Aero Generic drug: Budeson-Glycopyrrol-Formoterol Inhale 2 puffs into the lungs in the morning and at bedtime. with spacer and rinse mouth afterwards.   budesonide-formoterol 80-4.5 MCG/ACT inhaler Commonly known as: SYMBICORT Inhale  2 puffs into the lungs 2 (two) times daily.   calcipotriene 0.005 % cream Commonly known as: DOVONOX Apply topically as needed.   carvedilol 12.5 MG tablet Commonly known as: COREG Take 6.25 mg by mouth 2 (two) times daily with a meal.   Cellulose Carmellose Sodium Powd Apply 1 application topically daily as needed (irritation). Apply to feet   chlorhexidine 0.12 % solution Commonly known as: PERIDEX Take by mouth.   clobetasol ointment 0.05 % Commonly known as: TEMOVATE Apply 1 application topically 2 (two) times daily as needed (to affected area).   Diclofenac Sodium 1.5 % Soln Apply 4 drops topically 4 (four) times daily as needed (as directed- to affected area).   EPINEPHrine 0.3 mg/0.3 mL Soaj injection Commonly known as: EPI-PEN Inject 0.3 mLs (0.3 mg total) into the muscle as needed  for anaphylaxis.   Eylea 2 MG/0.05ML Soln Generic drug: Aflibercept Place into the left eye every 30 (thirty) days. INJECTION   Fluocinolone Acetonide Body 0.01 % Oil Apply 1 application topically See admin instructions. Apply a small amount to affected area daily   fluticasone 50 MCG/ACT nasal spray Commonly known as: FLONASE Place 1 spray into both nostrils 2 (two) times daily as needed (nasal congestion).   Flutter Devi Use as directed   furosemide 20 MG tablet Commonly known as: LASIX Take 20 mg by mouth daily.   ketoconazole 2 % shampoo Commonly known as: NIZORAL Apply 1 application topically 3 (three) times a week.   metFORMIN 500 MG tablet Commonly known as: GLUCOPHAGE Take 250 mg by mouth 2 (two) times daily as needed (for elevated BGL).   methocarbamol 500 MG tablet Commonly known as: ROBAXIN Take 1.5 tablets (750 mg total) by mouth every 6 (six) hours as needed for up to 30 doses for muscle spasms (Neck Stiffness).   montelukast 10 MG tablet Commonly known as: SINGULAIR Take 1 tablet (10 mg total) by mouth at bedtime.   MULTIVITAMIN & MINERAL PO Take 1 tablet by mouth daily. Reported on 06/17/2015   omeprazole 20 MG capsule Commonly known as: PRILOSEC Take 20 mg by mouth daily.   OneTouch Delica Lancets 81X Misc daily.   OneTouch Ultra test strip Generic drug: glucose blood as needed.   predniSONE 20 MG tablet Commonly known as: DELTASONE Take 1 tablet (20 mg total) by mouth daily with breakfast for 5 days. Start taking on: Jul 15, 2021   sertraline 100 MG tablet Commonly known as: ZOLOFT Take 100 mg by mouth daily.   simethicone 80 MG chewable tablet Commonly known as: MYLICON Chew 80 mg by mouth daily.   VITAMIN D-3 PO Take 1 capsule by mouth daily.               Durable Medical Equipment  (From admission, onward)           Start     Ordered   07/14/21 1415  For home use only DME oxygen  Once       Comments: 2l Nasal cannula  during ambulation  Question Answer Comment  Length of Need Lifetime   Mode or (Route) Nasal cannula   Liters per Minute 2   Frequency Continuous (stationary and portable oxygen unit needed)   Oxygen delivery system Gas      07/14/21 1414            Follow-up Information     Janie Morning, DO Follow up in 1 week(s).   Specialty: Family Medicine Contact information: [place]  [place] [address] [phone]         Pixie Casino, MD .   Specialty: Cardiology Contact information: [address] [place] [address] [phone]                Allergies  Allergen Reactions   Codeine Nausea Only and Other (See Comments)    GI upset   Lipitor [Atorvastatin] Other (See Comments)    "Made my stomach burn"   Pravachol [Pravastatin Sodium] Other (See Comments)    Caused joint pain    The results of significant diagnostics from this hospitalization (including imaging, microbiology, ancillary and laboratory) are listed below for reference.    Microbiology: Recent Results (from the past 240 hour(s))  Resp Panel by RT-PCR (Flu A&B, Covid) Nasopharyngeal Swab     Status: None   Collection Time: 07/12/21  5:29 PM   Specimen: Nasopharyngeal Swab; Nasopharyngeal(NP) swabs in vial transport medium  Result Value Ref Range Status   SARS Coronavirus 2 by RT PCR NEGATIVE NEGATIVE Final    Comment: (NOTE) SARS-CoV-2 target nucleic acids are NOT DETECTED.  The SARS-CoV-2 RNA is generally detectable in upper respiratory specimens during the acute phase of infection. The lowest concentration of SARS-CoV-2 viral copies this assay can detect is 138 copies/mL. A negative result does not preclude SARS-Cov-2 infection and should not be used as the sole basis for treatment or other patient management decisions. A negative result may occur with  improper specimen collection/handling, submission of specimen other than nasopharyngeal swab,  presence of viral mutation(s) within the areas targeted by this assay, and inadequate number of viral copies(<138 copies/mL). A negative result must be combined with clinical observations, patient history, and epidemiological information. The expected result is Negative.  Fact Sheet for Patients:  EntrepreneurPulse.com.au  Fact Sheet for Healthcare Providers:  IncredibleEmployment.be  This test is no t yet approved or cleared by the [place] FDA and  has been authorized for detection and/or diagnosis of SARS-CoV-2 by FDA under an Emergency Use Authorization (EUA). This EUA will remain  in effect (meaning this test can be used) for the duration of the COVID-19 declaration under Section 564(b)(1) of the Act, 21 U.S.C.section 360bbb-3(b)(1), unless the authorization is terminated  or revoked sooner.       Influenza A by PCR NEGATIVE NEGATIVE Final   Influenza B by PCR NEGATIVE NEGATIVE Final    Comment: (NOTE) The Xpert Xpress SARS-CoV-2/FLU/RSV plus assay is intended as an aid in the diagnosis of influenza from Nasopharyngeal swab specimens and should not be used as a sole basis for treatment. Nasal washings and aspirates are unacceptable for Xpert Xpress SARS-CoV-2/FLU/RSV testing.  Fact Sheet for Patients: EntrepreneurPulse.com.au  Fact Sheet for Healthcare Providers: IncredibleEmployment.be  This test is not yet approved or cleared by the [place] FDA and has been authorized for detection and/or diagnosis of SARS-CoV-2 by FDA under an Emergency Use Authorization (EUA). This EUA will remain in effect (meaning this test can be used) for the duration of the COVID-19 declaration under Section 564(b)(1) of the Act, 21 U.S.C. section 360bbb-3(b)(1), unless the authorization is terminated or revoked.  Performed at [facility], [place] [address], [place], [place] [address]    Respiratory (~20 pathogens) panel by PCR     Status: None   Collection Time: 07/12/21  5:29 PM   Specimen: Nasopharyngeal Swab; Respiratory  Result Value Ref Range Status   Adenovirus NOT DETECTED NOT DETECTED Final   Coronavirus  229E NOT DETECTED NOT DETECTED Final    Comment: (NOTE) The Coronavirus on the Respiratory Panel, DOES NOT test for the novel  Coronavirus (2019 nCoV)    Coronavirus HKU1 NOT DETECTED NOT DETECTED Final   Coronavirus NL63 NOT DETECTED NOT DETECTED Final   Coronavirus OC43 NOT DETECTED NOT DETECTED Final   Metapneumovirus NOT DETECTED NOT DETECTED Final   Rhinovirus / Enterovirus NOT DETECTED NOT DETECTED Final   Influenza A NOT DETECTED NOT DETECTED Final   Influenza B NOT DETECTED NOT DETECTED Final   Parainfluenza Virus 1 NOT DETECTED NOT DETECTED Final   Parainfluenza Virus 2 NOT DETECTED NOT DETECTED Final   Parainfluenza Virus 3 NOT DETECTED NOT DETECTED Final   Parainfluenza Virus 4 NOT DETECTED NOT DETECTED Final   Respiratory Syncytial Virus NOT DETECTED NOT DETECTED Final   Bordetella pertussis NOT DETECTED NOT DETECTED Final   Bordetella Parapertussis NOT DETECTED NOT DETECTED Final   Chlamydophila pneumoniae NOT DETECTED NOT DETECTED Final   Mycoplasma pneumoniae NOT DETECTED NOT DETECTED Final    Comment: Performed at [facility], [place] [address] [place], [place] [address]  Culture, blood (routine x 2)     Status: None (Preliminary result)   Collection Time: 07/12/21  5:41 PM   Specimen: BLOOD  Result Value Ref Range Status   Specimen Description   Final    BLOOD LEFT ANTECUBITAL Performed at [facility] [address], [place], [place] [address]    Special Requests   Final    BOTTLES DRAWN AEROBIC AND ANAEROBIC Blood Culture adequate volume Performed at [facility] [address], [place], [place] [address]    Culture   Final    NO GROWTH 2 DAYS Performed at [facility] [address], [place], [place] [address]    Report Status PENDING  Incomplete  Culture, blood (routine x 2)     Status: None (Preliminary result)   Collection Time: 07/12/21  9:40 PM   Specimen: BLOOD  Result Value Ref Range Status   Specimen Description   Final    BLOOD RIGHT HAND Performed at [facility] [address] [place], [place] [address]    Special Requests   Final    BOTTLES DRAWN AEROBIC AND ANAEROBIC Blood Culture adequate volume Performed at [facility] [address] [place], [place] [address]    Culture   Final    NO GROWTH 1 DAY Performed at [facility], [place] [address], [place], [place] [address]    Report Status PENDING  Incomplete    Procedures/Studies: DG Chest 2 View  Result Date: 07/14/2021 CLINICAL DATA:  Positive D-dimer EXAM: CHEST - 2 VIEW COMPARISON:  07/12/2021 FINDINGS: Low lung volumes. Patchy and linear left basilar opacity. No sizable effusion. Borderline cardiac enlargement. No pneumothorax IMPRESSION: 1. Low lung volumes. Patchy opacity at the left base, may be due to atelectasis or small pneumonia. 2. Borderline cardiomegaly Electronically Signed   By: Donavan Foil M.D.   On: 07/14/2021 16:24   CT Cervical Spine Wo Contrast  Result Date: 07/12/2021 CLINICAL DATA:  Neck pain. EXAM: CT CERVICAL SPINE WITHOUT CONTRAST TECHNIQUE: Multidetector CT imaging of the cervical spine was performed without intravenous contrast. Multiplanar CT image reconstructions were also generated. RADIATION DOSE REDUCTION: This exam was performed according to the departmental dose-optimization program which includes automated exposure control, adjustment of the mA and/or kV according to patient size and/or use of iterative  reconstruction technique. COMPARISON:  None Available. FINDINGS: Alignment: There is reversal of the normal cervical spine lordosis. There is approximately 1 mm anterolisthesis of C3 on C4. Skull base and vertebrae: No  acute fracture. No primary bone lesion or focal pathologic process. Soft tissues and spinal canal: No prevertebral fluid or swelling. No visible canal hematoma. Disc levels: Marked severity endplate sclerosis and anterior osteophyte formation are seen at the levels of C4-C5, C5-C6, C6-C7 and C7-T1. Marked severity intervertebral disc space narrowing is seen at C3-C4, C4-C5, C5-C6, C6-C7 and C7-T1. Bilateral marked severity multilevel facet joint hypertrophy is noted. Upper chest: Negative. Other: None. IMPRESSION: 1. Marked severity multilevel degenerative changes without evidence of an acute fracture or subluxation. 2. Reversal of the normal cervical spine lordosis with approximately 1 mm anterolisthesis of C3 on C4. Electronically Signed   By: Virgina Norfolk M.D.   On: 07/12/2021 18:14   NM Pulmonary Perfusion  Result Date: 07/14/2021 CLINICAL DATA:  Hypoxia and tachycardia.  Elevated D-dimer. EXAM: NUCLEAR MEDICINE PERFUSION LUNG SCAN TECHNIQUE: Perfusion images were obtained in multiple projections after intravenous injection of radiopharmaceutical. Ventilation scans intentionally deferred if perfusion scan and chest x-ray adequate for interpretation during COVID 19 epidemic. RADIOPHARMACEUTICALS:  4.2 mCi Tc-78mMAA IV COMPARISON:  Radiograph earlier today FINDINGS: There are no wedge-shaped perfusion defects. Perfusion is mildly heterogeneous but no peripheral defects to suggest pulmonary embolus. IMPRESSION: No scintigraphic evidence of pulmonary embolus. Electronically Signed   By: MKeith RakeM.D.   On: 07/14/2021 16:59   DG Chest Port 1 View  Result Date: 07/12/2021 CLINICAL DATA:  Shortness of breath EXAM: PORTABLE CHEST 1 VIEW COMPARISON:  Chest x-ray dated January 02, 2020 FINDINGS: The heart size and mediastinal contours are within normal limits. Both lungs are clear. The visualized skeletal structures are unremarkable. IMPRESSION: No active disease. Electronically Signed   By: LYetta GlassmanM.D.   On: 07/12/2021 16:50    Labs: BNP (last 3 results) Recent Labs    07/12/21 2141 07/14/21 1410  BNP 32.8 340.9  Basic Metabolic Panel: Recent Labs  Lab 07/12/21 1640 07/13/21 0514 07/13/21 0741 07/14/21 1410  NA 136 134*  --  137  K 4.0 6.2* 3.9 4.3  CL 102 100  --  101  CO2 27 23  --  29  GLUCOSE 116* 182*  --  164*  BUN 15 20  --  44*  CREATININE 0.95 1.06  --  1.06  CALCIUM 10.2 10.2  --  10.5*   Liver Function Tests: No results for input(s): AST, ALT, ALKPHOS, BILITOT, PROT, ALBUMIN in the last 168 hours. No results for input(s): LIPASE, AMYLASE in the last 168 hours. No results for input(s): AMMONIA in the last 168 hours. CBC: Recent Labs  Lab 07/12/21 1640 07/13/21 0514  WBC 9.9 8.6  NEUTROABS 6.5  --   HGB 15.3 14.9  HCT 47.1 45.7  MCV 94.2 93.5  PLT 191 193   Cardiac Enzymes: No results for input(s): CKTOTAL, CKMB, CKMBINDEX, TROPONINI in the last 168 hours. BNP: Invalid input(s): POCBNP CBG: Recent Labs  Lab 07/13/21 1702 07/13/21 2124 07/14/21 0720 07/14/21 1136 07/14/21 1650  GLUCAP 164* 192* 136* 139* 150*   D-Dimer Recent Labs    07/14/21 1410  DDIMER 0.88*   Hgb A1c No results for input(s): HGBA1C in the last 72 hours. Lipid Profile No results for input(s): CHOL, HDL, LDLCALC, TRIG, CHOLHDL, LDLDIRECT in the last 72 hours. Thyroid function studies No results for input(s): TSH,  T4TOTAL, T3FREE, THYROIDAB in the last 72 hours.  Invalid input(s): FREET3 Anemia work up No results for input(s): VITAMINB12, FOLATE, FERRITIN, TIBC, IRON, RETICCTPCT in the last 72 hours. Urinalysis    Component Value Date/Time   COLORURINE YELLOW 07/12/2021 2002   APPEARANCEUR CLEAR 07/12/2021 2002   LABSPEC 1.017 07/12/2021 2002   PHURINE 5.0 07/12/2021 2002   GLUCOSEU NEGATIVE 07/12/2021 2002   HGBUR NEGATIVE 07/12/2021 2002   [place] NEGATIVE 07/12/2021 2002   KETONESUR 5 (A) 07/12/2021 2002   PROTEINUR 30 (A) 07/12/2021  2002   NITRITE NEGATIVE 07/12/2021 2002   LEUKOCYTESUR NEGATIVE 07/12/2021 2002   Sepsis Labs Invalid input(s): PROCALCITONIN,  WBC,  LACTICIDVEN Microbiology Recent Results (from the past 240 hour(s))  Resp Panel by RT-PCR (Flu A&B, Covid) Nasopharyngeal Swab     Status: None   Collection Time: 07/12/21  5:29 PM   Specimen: Nasopharyngeal Swab; Nasopharyngeal(NP) swabs in vial transport medium  Result Value Ref Range Status   SARS Coronavirus 2 by RT PCR NEGATIVE NEGATIVE Final    Comment: (NOTE) SARS-CoV-2 target nucleic acids are NOT DETECTED.  The SARS-CoV-2 RNA is generally detectable in upper respiratory specimens during the acute phase of infection. The lowest concentration of SARS-CoV-2 viral copies this assay can detect is 138 copies/mL. A negative result does not preclude SARS-Cov-2 infection and should not be used as the sole basis for treatment or other patient management decisions. A negative result may occur with  improper specimen collection/handling, submission of specimen other than nasopharyngeal swab, presence of viral mutation(s) within the areas targeted by this assay, and inadequate number of viral copies(<138 copies/mL). A negative result must be combined with clinical observations, patient history, and epidemiological information. The expected result is Negative.  Fact Sheet for Patients:  EntrepreneurPulse.com.au  Fact Sheet for Healthcare Providers:  IncredibleEmployment.be  This test is no t yet approved or cleared by the [place] FDA and  has been authorized for detection and/or diagnosis of SARS-CoV-2 by FDA under an Emergency Use Authorization (EUA). This EUA will remain  in effect (meaning this test can be used) for the duration of the COVID-19 declaration under Section 564(b)(1) of the Act, 21 U.S.C.section 360bbb-3(b)(1), unless the authorization is terminated  or revoked sooner.       Influenza A by  PCR NEGATIVE NEGATIVE Final   Influenza B by PCR NEGATIVE NEGATIVE Final    Comment: (NOTE) The Xpert Xpress SARS-CoV-2/FLU/RSV plus assay is intended as an aid in the diagnosis of influenza from Nasopharyngeal swab specimens and should not be used as a sole basis for treatment. Nasal washings and aspirates are unacceptable for Xpert Xpress SARS-CoV-2/FLU/RSV testing.  Fact Sheet for Patients: EntrepreneurPulse.com.au  Fact Sheet for Healthcare Providers: IncredibleEmployment.be  This test is not yet approved or cleared by the [place] FDA and has been authorized for detection and/or diagnosis of SARS-CoV-2 by FDA under an Emergency Use Authorization (EUA). This EUA will remain in effect (meaning this test can be used) for the duration of the COVID-19 declaration under Section 564(b)(1) of the Act, 21 U.S.C. section 360bbb-3(b)(1), unless the authorization is terminated or revoked.  Performed at [facility], [place] [address] [place], [place] [address]   Respiratory (~20 pathogens) panel by PCR     Status: None   Collection Time: 07/12/21  5:29 PM   Specimen: Nasopharyngeal Swab; Respiratory  Result Value Ref Range Status   Adenovirus NOT DETECTED NOT DETECTED Final   Coronavirus 229E NOT DETECTED NOT DETECTED  Final    Comment: (NOTE) The Coronavirus on the Respiratory Panel, DOES NOT test for the novel  Coronavirus (2019 nCoV)    Coronavirus HKU1 NOT DETECTED NOT DETECTED Final   Coronavirus NL63 NOT DETECTED NOT DETECTED Final   Coronavirus OC43 NOT DETECTED NOT DETECTED Final   Metapneumovirus NOT DETECTED NOT DETECTED Final   Rhinovirus / Enterovirus NOT DETECTED NOT DETECTED Final   Influenza A NOT DETECTED NOT DETECTED Final   Influenza B NOT DETECTED NOT DETECTED Final   Parainfluenza Virus 1 NOT DETECTED NOT DETECTED Final   Parainfluenza Virus 2 NOT DETECTED NOT DETECTED Final   Parainfluenza Virus 3 NOT  DETECTED NOT DETECTED Final   Parainfluenza Virus 4 NOT DETECTED NOT DETECTED Final   Respiratory Syncytial Virus NOT DETECTED NOT DETECTED Final   Bordetella pertussis NOT DETECTED NOT DETECTED Final   Bordetella Parapertussis NOT DETECTED NOT DETECTED Final   Chlamydophila pneumoniae NOT DETECTED NOT DETECTED Final   Mycoplasma pneumoniae NOT DETECTED NOT DETECTED Final    Comment: Performed at [facility], [place] [address], [place], [place] [address]  Culture, blood (routine x 2)     Status: None (Preliminary result)   Collection Time: 07/12/21  5:41 PM   Specimen: BLOOD  Result Value Ref Range Status   Specimen Description   Final    BLOOD LEFT ANTECUBITAL Performed at [facility] [address], [place], [place] [address]    Special Requests   Final    BOTTLES DRAWN AEROBIC AND ANAEROBIC Blood Culture adequate volume Performed at [facility] [address], [place], [place] [address]    Culture   Final    NO GROWTH 2 DAYS Performed at [facility] [address], [place], [place] [address]    Report Status PENDING  Incomplete  Culture, blood (routine x 2)     Status: None (Preliminary result)   Collection Time: 07/12/21  9:40 PM   Specimen: BLOOD  Result Value Ref Range Status   Specimen Description   Final    BLOOD RIGHT HAND Performed at [facility] [address], [place], [place] [address]    Special Requests   Final    BOTTLES DRAWN AEROBIC AND ANAEROBIC Blood Culture adequate volume Performed at [facility] [address] [place], [place] [address]    Culture   Final    NO GROWTH 1 DAY Performed at [facility], [place] [address], [place], [place] [address]    Report Status PENDING  Incomplete     Time coordinating discharge: 25 minutes  SIGNED: Antonieta Pert, MD  [facility] 07/14/2021, 6:33 PM  If 7PM-7AM, please contact  night-coverage [email or web address]

## 2021-07-14 NOTE — Progress Notes (Signed)
Nutrition Brief Note ? ?Patient identified on the Malnutrition Screening Tool (MST) Report. Note in report states that patient reported 2 lb weight loss. ? ?Wt Readings from Last 15 Encounters:  ?07/12/21 87.1 kg  ?04/14/21 89.9 kg  ?01/27/21 87.5 kg  ?08/22/20 87.5 kg  ?05/20/20 86.7 kg  ?02/15/20 88.6 kg  ?01/02/20 87.4 kg  ?11/24/19 86.6 kg  ?10/25/19 88.4 kg  ?08/21/19 89.2 kg  ?06/07/19 89.7 kg  ?04/07/19 88.5 kg  ?12/09/17 82.9 kg  ?10/12/17 82.6 kg  ?09/20/17 84.6 kg  ? ? ?Body mass index is 30.99 kg/m?Marland Kitchen Patient meets criteria for obesity based on current BMI. Weight on 5/6 was 192 lb and weight on 2/6 was 198 lb; this indicates 6 lb weight loss (3% body weight) in 3 months which is not significant for time frame. ? ?Skin WDL. No information documented in the edema section of flow sheet this admission.  ? ?Current diet order is Heart Healthy/Carb Modified. He ate 100% of dinner yesterday (528 kcal and 24 grams protein) and 100% of breakfast this morning (510 kcal and 23 grams protein). ? ?Ensure was ordered BID per ONS protocol starting yesterday AM and he has accepted all 3 bottles offered to him.  ? ?Labs and medications reviewed.  ? ?No nutrition interventions warranted at this time. If nutrition issues arise, please consult RD.  ? ? ? ? ?Douglas Matin, MS, RD, LDN ?Registered Dietitian II ?Inpatient Clinical Nutrition ?RD pager # and on-call/weekend pager # available in [place]  ? ? ?

## 2021-07-14 NOTE — Progress Notes (Signed)
PROGRESS NOTE Douglas Hansen  SJG:[id number] DOB: Apr 12, 1945 DOA: 07/12/2021 PCP: Janie Morning, DO   Brief Narrative/Hospital Course: 65 y.om w/ asthma,COPDx 6-7 yrs,OSA on CPAP, diabetes, hyperlipidemia, hypertension, GERD, claudication, PTSD presented initially for neck pain times few days seen at the urgent care was found to be hypoxic to the mid 80s on room air and sent to ED where he was febrile 101F,tachycardic 90s to 100s, SBP 160s to 190s,  hypoxic to the mid to upper 80s on room air, saturating well on 2 L [place]. Lab work-up included BMP with glucose 116.  CBC within normal limits.  Lactic acid normal.  BNP 32.   Respiratory panel for flu and COVID-negative. CXR without acute abnormality.  CT C-spine showing multiple level degenerative changes but no acute changesPatient received Solu-Medrol, DuoNeb, albuterol as well as amlodipine and irbesartan in the ED and admitted to [facility] for acute hypoxic respiratory failure/COPD exacerbation, neck pain cervical spondylosis. He feels significantly improved, not needing oxygen at rest but needing on ambulation.     Subjective: Seen and examined this morning patient reports he is feeling much better. He is however hypoxic with ambulation. Has not been on oxygen before.   Assessment and Plan: Principal Problem:   Acute respiratory failure with hypoxia (HCC) Active Problems:   Asthma-COPD overlap syndrome (HCC)   OSA on CPAP   Diabetes mellitus (HCC)   Hyperlipemia   Essential hypertension   Gastroesophageal reflux disease without esophagitis   COPD with acute exacerbation (HCC)   Hyperkalemia   A/p Acute respiratory failure with hypoxia COPD with acute exacerbation  Asthma-COPD overlap syndrome: Hypoxic in the ED with transient fever x1 of 101- no recurrence since then. Had  tachycardia mild- chest x-ray without acute finding had some neck pain which is improving attributes to his cervical degenerative disease.  We will continue oxygen but needing  mild on ambulation dropped to 87%.  We will check proBNP and D-dimer -given new oxygen requirement D-dimer is elevated 0.8, proceed with VQ scan -patient agreeable to further work-up.  Will need home oxygen during ambulation and is being set up.Continue on the steroid taper, bronchodilators, Singulair  Diabetes mellitus type II with hyperglycemia up to 229:Blood sugar stable, now, ssi  as inpatient. Resume metformin on discharge  Hyperlipemia: Follow-up with PCP Essential hypertension: BP controlled on amlodipine, Coreg, Lasix GERD-continue PPI Hyperkalemia-resolved OSA on CPAP  Class I Obesity:Patient's Body mass index is 30.99 kg/m. : Will benefit with PCP follow-up, weight loss  healthy lifestyle.  DVT prophylaxis: enoxaparin (LOVENOX) injection 40 mg Start: 07/12/21 2200 Code Status:   Code Status: Full Code Family Communication: plan of care discussed with patient at bedside. Patient status is: Inpatient because of ongoing management/work-up of hypoxic respiratory failure Level of care: Telemetry   Dispo: The patient is from: HOME            Anticipated disposition: HOME in 24 hrs  Mobility Assessment (last 72 hours)     Mobility Assessment     Row Name 07/13/21 2006 07/12/21 2111         Does patient have an order for bedrest or is patient medically unstable No - Continue assessment No - Continue assessment      What is the highest level of mobility based on the progressive mobility assessment? Level 5 (Walks with assist in room/hall) - Balance while stepping forward/back and can walk in room with assist - Complete Level 5 (Walks with assist in room/hall) - Balance while stepping forward/back  and can walk in room with assist - Complete                Objective: Vitals last 24 hrs: Vitals:   07/13/21 1946 07/13/21 2129 07/14/21 0539 07/14/21 1318  BP:  120/61 (!) 122/57 132/72  Pulse:  67 64 70  Resp: '19  17 20  '$ Temp:  97.8 F (36.6 C) 97.7 F (36.5 C) 97.6 F  (36.4 C)  TempSrc:  Oral Oral Oral  SpO2:  98% 98% 94%  Weight:      Height:       Weight change:   Physical Examination: General exam: alert awake, oriented,older than stated age, weak appearing. HEENT:Oral mucosa moist, Ear/Nose WNL grossly, dentition normal. Respiratory system: bilaterally air entry +, no wheezing, no use of accessory muscle Cardiovascular system: S1 & S2 +, No JVD. Gastrointestinal system: Abdomen soft,NT,ND, BS+ Nervous System:Alert, awake, moving extremities and grossly nonfocal Extremities: LE edema none,distal peripheral pulses palpable.  Skin: No rashes,no icterus. MSK: Normal muscle bulk,tone, power  Medications reviewed:  Scheduled Meds:  amLODipine  10 mg Oral Daily   aspirin  325 mg Oral Daily   azithromycin  250 mg Oral Daily   carvedilol  6.25 mg Oral BID WC   enoxaparin (LOVENOX) injection  40 mg Subcutaneous Q24H   feeding supplement  237 mL Oral BID BM   furosemide  20 mg Oral Daily   insulin aspart  0-15 Units Subcutaneous TID WC   montelukast  10 mg Oral QHS   predniSONE  40 mg Oral Q breakfast   sertraline  100 mg Oral Daily   sodium chloride flush  3 mL Intravenous Q12H  Continuous Infusions:   Diet Order             Diet heart healthy/carb modified Room service appropriate? Yes; Fluid consistency: Thin  Diet effective now                  Intake/Output Summary (Last 24 hours) at 07/14/2021 1524 Last data filed at 07/14/2021 1451 Gross per 24 hour  Intake 840 ml  Output --  Net 840 ml   Net IO Since Admission: 840 mL [07/14/21 1524]  Wt Readings from Last 3 Encounters:  07/12/21 87.1 kg  04/14/21 89.9 kg  01/27/21 87.5 kg     Unresulted Labs (From admission, onward)     Start     Ordered   07/19/21 0500  Creatinine, serum  (enoxaparin (LOVENOX)    CrCl >/= 30 ml/min)  Weekly,   R     Comments: while on enoxaparin therapy    07/12/21 1944          Data Reviewed: I have personally reviewed following labs and  imaging studies CBC: Recent Labs  Lab 07/12/21 1640 07/13/21 0514  WBC 9.9 8.6  NEUTROABS 6.5  --   HGB 15.3 14.9  HCT 47.1 45.7  MCV 94.2 93.5  PLT 191 841   Basic Metabolic Panel: Recent Labs  Lab 07/12/21 1640 07/13/21 0514 07/13/21 0741 07/14/21 1410  NA 136 134*  --  137  K 4.0 6.2* 3.9 4.3  CL 102 100  --  101  CO2 27 23  --  29  GLUCOSE 116* 182*  --  164*  BUN 15 20  --  44*  CREATININE 0.95 1.06  --  1.06  CALCIUM 10.2 10.2  --  10.5*   GFR: Estimated Creatinine Clearance: 59.4 mL/min (by C-G formula based on SCr  of 1.06 mg/dL). Liver Function Tests: No results for input(s): AST, ALT, ALKPHOS, BILITOT, PROT, ALBUMIN in the last 168 hours. No results for input(s): LIPASE, AMYLASE in the last 168 hours. No results for input(s): AMMONIA in the last 168 hours. Coagulation Profile: No results for input(s): INR, PROTIME in the last 168 hours. BNP (last 3 results) No results for input(s): PROBNP in the last 8760 hours. HbA1C: No results for input(s): HGBA1C in the last 72 hours. CBG: Recent Labs  Lab 07/13/21 1304 07/13/21 1702 07/13/21 2124 07/14/21 0720 07/14/21 1136  GLUCAP 146* 164* 192* 136* 139*   Lipid Profile: No results for input(s): CHOL, HDL, LDLCALC, TRIG, CHOLHDL, LDLDIRECT in the last 72 hours. Thyroid Function Tests: No results for input(s): TSH, T4TOTAL, FREET4, T3FREE, THYROIDAB in the last 72 hours. Sepsis Labs: Recent Labs  Lab 07/12/21 1640 07/12/21 2141  LATICACIDVEN 1.0 1.5    Recent Results (from the past 240 hour(s))  Resp Panel by RT-PCR (Flu A&B, Covid) Nasopharyngeal Swab     Status: None   Collection Time: 07/12/21  5:29 PM   Specimen: Nasopharyngeal Swab; Nasopharyngeal(NP) swabs in vial transport medium  Result Value Ref Range Status   SARS Coronavirus 2 by RT PCR NEGATIVE NEGATIVE Final    Comment: (NOTE) SARS-CoV-2 target nucleic acids are NOT DETECTED.  The SARS-CoV-2 RNA is generally detectable in upper  respiratory specimens during the acute phase of infection. The lowest concentration of SARS-CoV-2 viral copies this assay can detect is 138 copies/mL. A negative result does not preclude SARS-Cov-2 infection and should not be used as the sole basis for treatment or other patient management decisions. A negative result may occur with  improper specimen collection/handling, submission of specimen other than nasopharyngeal swab, presence of viral mutation(s) within the areas targeted by this assay, and inadequate number of viral copies(<138 copies/mL). A negative result must be combined with clinical observations, patient history, and epidemiological information. The expected result is Negative.  Fact Sheet for Patients:  EntrepreneurPulse.com.au  Fact Sheet for Healthcare Providers:  IncredibleEmployment.be  This test is no t yet approved or cleared by the [place] FDA and  has been authorized for detection and/or diagnosis of SARS-CoV-2 by FDA under an Emergency Use Authorization (EUA). This EUA will remain  in effect (meaning this test can be used) for the duration of the COVID-19 declaration under Section 564(b)(1) of the Act, 21 U.S.C.section 360bbb-3(b)(1), unless the authorization is terminated  or revoked sooner.       Influenza A by PCR NEGATIVE NEGATIVE Final   Influenza B by PCR NEGATIVE NEGATIVE Final    Comment: (NOTE) The Xpert Xpress SARS-CoV-2/FLU/RSV plus assay is intended as an aid in the diagnosis of influenza from Nasopharyngeal swab specimens and should not be used as a sole basis for treatment. Nasal washings and aspirates are unacceptable for Xpert Xpress SARS-CoV-2/FLU/RSV testing.  Fact Sheet for Patients: EntrepreneurPulse.com.au  Fact Sheet for Healthcare Providers: IncredibleEmployment.be  This test is not yet approved or cleared by the [place] FDA and has been  authorized for detection and/or diagnosis of SARS-CoV-2 by FDA under an Emergency Use Authorization (EUA). This EUA will remain in effect (meaning this test can be used) for the duration of the COVID-19 declaration under Section 564(b)(1) of the Act, 21 U.S.C. section 360bbb-3(b)(1), unless the authorization is terminated or revoked.  Performed at [facility], [place] [address] [place], [place] [address]   Respiratory (~20 pathogens) panel by PCR  Status: None   Collection Time: 07/12/21  5:29 PM   Specimen: Nasopharyngeal Swab; Respiratory  Result Value Ref Range Status   Adenovirus NOT DETECTED NOT DETECTED Final   Coronavirus 229E NOT DETECTED NOT DETECTED Final    Comment: (NOTE) The Coronavirus on the Respiratory Panel, DOES NOT test for the novel  Coronavirus (2019 nCoV)    Coronavirus HKU1 NOT DETECTED NOT DETECTED Final   Coronavirus NL63 NOT DETECTED NOT DETECTED Final   Coronavirus OC43 NOT DETECTED NOT DETECTED Final   Metapneumovirus NOT DETECTED NOT DETECTED Final   Rhinovirus / Enterovirus NOT DETECTED NOT DETECTED Final   Influenza A NOT DETECTED NOT DETECTED Final   Influenza B NOT DETECTED NOT DETECTED Final   Parainfluenza Virus 1 NOT DETECTED NOT DETECTED Final   Parainfluenza Virus 2 NOT DETECTED NOT DETECTED Final   Parainfluenza Virus 3 NOT DETECTED NOT DETECTED Final   Parainfluenza Virus 4 NOT DETECTED NOT DETECTED Final   Respiratory Syncytial Virus NOT DETECTED NOT DETECTED Final   Bordetella pertussis NOT DETECTED NOT DETECTED Final   Bordetella Parapertussis NOT DETECTED NOT DETECTED Final   Chlamydophila pneumoniae NOT DETECTED NOT DETECTED Final   Mycoplasma pneumoniae NOT DETECTED NOT DETECTED Final    Comment: Performed at [facility], [place]. [address], [place], [place] [address]  Culture, blood (routine x 2)     Status: None (Preliminary result)   Collection Time: 07/12/21  5:41 PM   Specimen: BLOOD  Result Value Ref  Range Status   Specimen Description   Final    BLOOD LEFT ANTECUBITAL Performed at [facility] [address], [place], [place] [address]    Special Requests   Final    BOTTLES DRAWN AEROBIC AND ANAEROBIC Blood Culture adequate volume Performed at [facility] [address], [place], [place] [address]    Culture   Final    NO GROWTH 2 DAYS Performed at [facility] [address], [place], [place] [address]    Report Status PENDING  Incomplete  Culture, blood (routine x 2)     Status: None (Preliminary result)   Collection Time: 07/12/21  9:40 PM   Specimen: BLOOD  Result Value Ref Range Status   Specimen Description   Final    BLOOD RIGHT HAND Performed at [facility] [address], [place], [place] [address]    Special Requests   Final    BOTTLES DRAWN AEROBIC AND ANAEROBIC Blood Culture adequate volume Performed at [place] [address], [place], [place] [address]    Culture   Final    NO GROWTH 1 DAY Performed at [facility], [place] [address] [place], [place] [address]    Report Status PENDING  Incomplete    Antimicrobials: Anti-infectives (From admission, onward)    Start     Dose/Rate Route Frequency Ordered Stop   07/13/21 1815  azithromycin (ZITHROMAX) tablet 250 mg        250 mg Oral Daily 07/13/21 1715     07/12/21 1945  cefTRIAXone (ROCEPHIN) 1 g in sodium chloride 0.9 % 100 mL IVPB        1 g 200 mL/hr over 30 Minutes Intravenous  Once 07/12/21 1940 07/12/21 2101   07/12/21 1945  azithromycin (ZITHROMAX) 500 mg in sodium chloride 0.9 % 250 mL IVPB  Status:  Discontinued        500 mg 250 mL/hr over 60 Minutes Intravenous  Once 07/12/21 1940 07/13/21 1717      Culture/Microbiology    Component Value Date/Time   SDES  07/12/2021 2140    BLOOD RIGHT HAND Performed at [facility], [place] [address], [place], [place] [address]     SPECREQUEST  07/12/2021 2140    BOTTLES DRAWN AEROBIC AND ANAEROBIC Blood Culture adequate volume Performed at [facility] [address], [place], [place] [address]    CULT  07/12/2021 2140    NO GROWTH 1 DAY Performed at [facility] [address] [place], [place] [address]    REPTSTATUS PENDING 07/12/2021 2140   Radiology Studies: CT Cervical Spine Wo Contrast  Result Date: 07/12/2021 CLINICAL DATA:  Neck pain. EXAM: CT CERVICAL SPINE WITHOUT CONTRAST TECHNIQUE: Multidetector CT imaging of the cervical spine was performed without intravenous contrast. Multiplanar CT image reconstructions were also generated. RADIATION DOSE REDUCTION: This exam was performed according to the departmental dose-optimization program which includes automated exposure control, adjustment of the mA and/or kV according to patient size and/or use of iterative reconstruction technique. COMPARISON:  None Available. FINDINGS: Alignment: There is reversal of the normal cervical spine lordosis. There is approximately 1 mm anterolisthesis of C3 on C4. Skull base and vertebrae: No acute fracture. No primary bone lesion or focal pathologic process. Soft tissues and spinal canal: No prevertebral fluid or swelling. No visible canal hematoma. Disc levels: Marked severity endplate sclerosis and anterior osteophyte formation are seen at the levels of C4-C5, C5-C6, C6-C7 and C7-T1. Marked severity intervertebral disc space narrowing is seen at C3-C4, C4-C5, C5-C6, C6-C7 and C7-T1. Bilateral marked severity multilevel facet joint hypertrophy is noted. Upper chest: Negative. Other: None. IMPRESSION: 1. Marked severity multilevel degenerative changes without evidence of an acute fracture or subluxation. 2. Reversal of the normal cervical spine lordosis with approximately 1 mm anterolisthesis of C3 on C4. Electronically Signed   By: Virgina Norfolk M.D.   On: 07/12/2021 18:14   DG Chest Port 1 View  Result  Date: 07/12/2021 CLINICAL DATA:  Shortness of breath EXAM: PORTABLE CHEST 1 VIEW COMPARISON:  Chest x-ray dated January 02, 2020 FINDINGS: The heart size and mediastinal contours are within normal limits. Both lungs are clear. The visualized skeletal structures are unremarkable. IMPRESSION: No active disease. Electronically Signed   By: Yetta Glassman M.D.   On: 07/12/2021 16:50     LOS: 1 day   Antonieta Pert, MD [facility]  07/14/2021, 3:24 PM

## 2021-07-14 NOTE — TOC Initial Note (Signed)
Transition of Care (TOC) - Initial/Assessment Note  ? ? ?Patient Details  ?Name: Douglas Hansen ?MRN: [id number] ?Date of Birth: [birth year] ? ?Transition of Care [facility]) CM/SW Contact:    ?Leeroy Cha, RN ?Phone Number: ?07/14/2021, 8:38 AM ? ?Clinical Narrative:                 ? ?Transition of Care (TOC) Screening Note ? ? ?Patient Details  ?Name: Douglas Hansen ?Date of Birth: 1945-07-10 ? ? ?Transition of Care [facility]) CM/SW Contact:    ?Leeroy Cha, RN ?Phone Number: ?07/14/2021, 8:38 AM ? ? ? ?Transition of Care Department [facility]) has reviewed patient and no TOC needs have been identified at this time. We will continue to monitor patient advancement through interdisciplinary progression rounds. If new patient transition needs arise, please place a TOC consult. ? ? ? ?Expected Discharge Plan: Home/Self Care ?Barriers to Discharge: No Barriers Identified ? ? ?Patient Goals and CMS Choice ?Patient states their goals for this hospitalization and ongoing recovery are:: to go home ?CMS Medicare.gov Compare Post Acute Care list provided to:: Patient ?Choice offered to / list presented to : Patient ? ?Expected Discharge Plan and Services ?Expected Discharge Plan: Home/Self Care ?  ?Discharge Planning Services: CM Consult ?  ?Living arrangements for the past 2 months: [place] ?                ?  ?  ?  ?  ?  ?  ?  ?  ?  ?  ? ?Prior Living Arrangements/Services ?Living arrangements for the past 2 months: [place] ?Lives with:: Self ?Patient language and need for interpreter reviewed:: Yes ?Do you feel safe going back to the place where you live?: Yes      ?  ?  ?  ?Criminal Activity/Legal Involvement Pertinent to Current Situation/Hospitalization: No - Comment as needed ? ?Activities of Daily Living ?Home Assistive Devices/Equipment: Cane (specify quad or straight) ?ADL Screening (condition at time of admission) ?Patient's cognitive ability adequate to safely complete daily activities?: Yes ?Is the  patient deaf or have difficulty hearing?: No ?Does the patient have difficulty seeing, even when wearing glasses/contacts?: No ?Does the patient have difficulty concentrating, remembering, or making decisions?: No ?Patient able to express need for assistance with ADLs?: Yes ?Does the patient have difficulty dressing or bathing?: No ?Independently performs ADLs?: Yes (appropriate for developmental age) ?Does the patient have difficulty walking or climbing stairs?: No ?Weakness of Legs: None ?Weakness of Arms/Hands: None ? ?Permission Sought/Granted ?  ?  ?   ?   ?   ?   ? ?Emotional Assessment ?Appearance:: Appears stated age ?  ?  ?Orientation: : Oriented to Self, Oriented to Place, Oriented to  Time, Oriented to Situation ?Alcohol / Substance Use: Not Applicable ?Psych Involvement: No (comment) ? ?Admission diagnosis:  Acute respiratory failure with hypoxia ([place]) [J96.01] ?Acute on chronic respiratory failure with hypoxia (HCC) [J96.21] ?Patient Active Problem List  ? Diagnosis Date Noted  ? Acute on chronic respiratory failure with hypoxia ([place]) 07/13/2021  ? Acute respiratory failure with hypoxia ([place]) 07/12/2021  ? Chest pain 01/02/2020  ? Hypoxia 11/22/2019  ? Dyslipidemia 06/03/2016  ? Other fatigue 06/03/2016  ? Claudication ([place]) 06/03/2016  ? Gastroesophageal reflux disease without esophagitis 06/17/2015  ? Acute pulmonary edema ([place]) 04/17/2015  ? Chronic obstructive pulmonary disease, unspecified copd, unspecified chronic bronchitis type 01/07/2015  ? Moderate persistent asthma 01/07/2015  ? Seasonal and perennial allergic rhinitis 01/07/2015  ?  Asthma-COPD overlap syndrome ([place])   ?  Class: Chronic  ? Pneumonia due to other gram-negative bacteria ([place]) 01/30/2011  ? Cough 06/09/2010  ? COPD (chronic obstructive pulmonary disease) ([place]) 06/09/2010  ? OSA on CPAP   ? Diabetes mellitus ([place])   ? Hyperlipemia   ? Essential hypertension   ? ?PCP:  Janie Morning, DO ?Pharmacy:   ?[facility] [place] ?[place] ?[place] [address] ?Phone: [phone] Fax: [phone] ? ?[place], [address] ?[phone] ?[place] [address] ?Phone: [phone] Fax: [phone] ? ? ? ? ?Social Determinants of Health (SDOH) Interventions ?  ? ?Readmission Risk Interventions ?   ? View : No data to display.  ?  ?  ?  ? ? ? ?

## 2021-07-17 ENCOUNTER — Encounter: Payer: Self-pay | Admitting: Physician Assistant

## 2021-07-17 ENCOUNTER — Ambulatory Visit (INDEPENDENT_AMBULATORY_CARE_PROVIDER_SITE_OTHER): Payer: Medicare Other | Admitting: Physician Assistant

## 2021-07-17 VITALS — BP 134/76 | HR 80 | Ht 66.0 in | Wt 196.6 lb

## 2021-07-17 DIAGNOSIS — I10 Essential (primary) hypertension: Secondary | ICD-10-CM

## 2021-07-17 DIAGNOSIS — J9610 Chronic respiratory failure, unspecified whether with hypoxia or hypercapnia: Secondary | ICD-10-CM

## 2021-07-17 DIAGNOSIS — I5032 Chronic diastolic (congestive) heart failure: Secondary | ICD-10-CM

## 2021-07-17 DIAGNOSIS — E785 Hyperlipidemia, unspecified: Secondary | ICD-10-CM | POA: Diagnosis not present

## 2021-07-17 DIAGNOSIS — G4733 Obstructive sleep apnea (adult) (pediatric): Secondary | ICD-10-CM | POA: Diagnosis not present

## 2021-07-17 DIAGNOSIS — I2510 Atherosclerotic heart disease of native coronary artery without angina pectoris: Secondary | ICD-10-CM

## 2021-07-17 LAB — CULTURE, BLOOD (ROUTINE X 2)
Culture: NO GROWTH
Special Requests: ADEQUATE

## 2021-07-17 MED ORDER — ASPIRIN 81 MG PO TBEC: 81.0000 mg | DELAYED_RELEASE_TABLET | Freq: Every day | ORAL | 3 refills | Status: DC

## 2021-07-17 NOTE — Patient Instructions (Addendum)
Medication Instructions:  ?DECREASE Aspirin to 81 mg daily  ? ?*If you need a refill on your cardiac medications before your next appointment, please call your pharmacy* ? ?Lab Work: ?NONE ordered at this time of appointment  ? ?If you have labs (blood work) drawn today and your tests are completely normal, you will receive your results only by: ?MyChart Message (if you have MyChart) OR ?A paper copy in the mail ?If you have any lab test that is abnormal or we need to change your treatment, we will call you to review the results. ? ?Testing/Procedures: ?You have been referred to Pulmonology  ? ?Follow-Up: ?At [facility], you and your health needs are our priority.  As part of our continuing mission to provide you with exceptional heart care, we have created designated Provider Care Teams.  These Care Teams include your primary Cardiologist (physician) and Advanced Practice Providers (APPs -  Physician Assistants and Nurse Practitioners) who all work together to provide you with the care you need, when you need it. ? ?Your next appointment:   ?1 year(s) ? ?The format for your next appointment:   ?In Person ? ?Provider:   ?Pixie Casino, MD   ? ? ?Other Instructions ? ? ?Important Information About Sugar ? ? ? ? ? ? ?

## 2021-07-17 NOTE — Progress Notes (Signed)
Cardiology Office Note:    Date:  07/17/2021   ID:  Douglas Hansen, DOB Apr 11, 1945, MRN [id number]  PCP:  Douglas Hansen, [place] Providers Cardiologist:  Douglas Casino, MD Cardiology APP:  Douglas Bottcher, PA {Referring MD: Douglas Morning, DO   Chief Complaint  Patient presents with   Follow-up    History of Present Illness:    Douglas Hansen is a 76 y.o. male with a hx of HTN, OSA on CPAP, COPD, DM, HTN, HLD, GERD, claudication, and PTSD.  He was previously followed by Dr. Einar Hansen and has a history of 2 cardiac catheterizations.  Last heart catheter 2004 with no significant coronary disease.  Stress test February 2014 negative for ischemia.  ABIs 0.9 on the right and 0.94 on the left.  He did not tolerate Pletal.  He has chronic dyspnea related to his COPD.  He was hospitalized in 2021 with chest pain felt to be related to acute diastolic congestive heart failure.  CT coronary with calcium score 209 which was the 44 percentile, moderate stenosis in the proximal RCA and proximal portion of the first diagonal.  FFR was negative.  A PFO was noted, and pulmonary artery was dilated suggesting pulmonary hypertension.  Echocardiogram at that time revealed a normal LVEF, normal RV function, and no significant valvular disease.  He was last seen in clinic with Dr. Debara Hansen 08/22/2020 and was doing well at that time.  He was recently seen at urgent care initially for neck pain but found to be hypoxic in the mid 80s febrile 101, tachycardia in the 100s and hypertensive.  He was sent to the ER for further evaluation.  He was admitted for acute hypoxic respiratory failure/COPD exacerbation and neck pain and cervical spondylolysis.  VQ scan negative for PE.  He was discharged on 07/14/2021 with a steroid taper and antibiotics.  He presents for routine follow-up today as he is going out of town and wanted to get checked out. He has no cardiac complaints. He uses O2 at home when he walks farther than  just room to room. He has not used CPAP in several years. He is going to [place] on a family vacation and requests a letter to take his oxygen.    Past Medical History:  Diagnosis Date   Agent orange exposure    Arthritis    Asthma    Asthma with acute exacerbation 06/17/2015   COPD (chronic obstructive pulmonary disease) (HCC)    Diabetes mellitus    Hyperlipemia    Hypertension    OSA on CPAP    Pneumonia    Pneumonia due to COVID-19 virus 11/22/2019   PTSD (post-traumatic stress disorder)     Past Surgical History:  Procedure Laterality Date   CARDIAC CATHETERIZATION     right thumb surgery     TONSILLECTOMY      Current Medications: Current Meds  Medication Sig   albuterol (PROVENTIL) (2.5 MG/3ML) 0.083% nebulizer solution Take 3 mLs (2.5 mg total) by nebulization every 4 (four) hours as needed for wheezing or shortness of breath (coughing fits).   albuterol (VENTOLIN HFA) 108 (90 Base) MCG/ACT inhaler INHALE 2 PUFFS BY MOUTH EVERY 4 HOURS AS NEEDED FOR WHEEZING OR SHORTNESS OF BREATH   amLODipine (NORVASC) 10 MG tablet Take 10 mg by mouth daily.   ascorbic acid (VITAMIN C) 500 MG tablet Take 1,000 mg by mouth daily.    azelastine (ASTELIN) 0.1 % nasal spray Place 1-2 sprays into both  nostrils 2 (two) times daily as needed (nasal drainage). Use in each nostril as directed   azithromycin (ZITHROMAX) 250 MG tablet Take 1 tablet (250 mg total) by mouth daily for 4 days.   Budeson-Glycopyrrol-Formoterol (BREZTRI AEROSPHERE) 160-9-4.8 MCG/ACT AERO Inhale 2 puffs into the lungs in the Hansen and at bedtime. with spacer and rinse mouth afterwards.   budesonide-formoterol (SYMBICORT) 80-4.5 MCG/ACT inhaler Inhale 2 puffs into the lungs 2 (two) times daily.   calcipotriene (DOVONOX) 0.005 % cream Apply topically as needed.   carvedilol (COREG) 12.5 MG tablet Take 6.25 mg by mouth 2 (two) times daily with a meal.    Cellulose Carmellose Sodium POWD Apply 1 application topically daily as  needed (irritation). Apply to feet   chlorhexidine (PERIDEX) 0.12 % solution Take by mouth.   Cholecalciferol (VITAMIN D-3 PO) Take 1 capsule by mouth daily.   clobetasol ointment (TEMOVATE) 6.54 % Apply 1 application topically 2 (two) times daily as needed (to affected area).    Diclofenac Sodium 1.5 % SOLN Apply 4 drops topically 4 (four) times daily as needed (as directed- to affected area).    doxazosin (CARDURA) 1 MG tablet doxazosin 1 mg tablet  TAKE 1 TABLET BY MOUTH EVERY DAY   EPINEPHrine 0.3 mg/0.3 mL IJ SOAJ injection Inject 0.3 mLs (0.3 mg total) into the muscle as needed for anaphylaxis.   EYLEA 2 MG/0.05ML SOLN Place into the left eye every 30 (thirty) days. INJECTION   Fluocinolone Acetonide Body 0.01 % OIL Apply 1 application topically See admin instructions. Apply a small amount to affected area daily   fluticasone (FLONASE) 50 MCG/ACT nasal spray Place 1 spray into both nostrils 2 (two) times daily as needed (nasal congestion).   furosemide (LASIX) 20 MG tablet Take 20 mg by mouth daily.   ketoconazole (NIZORAL) 2 % shampoo Apply 1 application topically 3 (three) times a week.   metFORMIN (GLUCOPHAGE) 500 MG tablet Take 250 mg by mouth 2 (two) times daily as needed (for elevated BGL).   methocarbamol (ROBAXIN) 500 MG tablet Take 1.5 tablets (750 mg total) by mouth every 6 (six) hours as needed for up to 30 doses for muscle spasms (Neck Stiffness).   montelukast (SINGULAIR) 10 MG tablet Take 1 tablet (10 mg total) by mouth at bedtime.   Multiple Vitamins-Minerals (MULTIVITAMIN & MINERAL PO) Take 1 tablet by mouth daily. Reported on 06/17/2015   omeprazole (PRILOSEC) 20 MG capsule Take 20 mg by mouth daily.   OneTouch Delica Lancets 65K MISC daily.   ONETOUCH ULTRA test strip as needed.    predniSONE (DELTASONE) 20 MG tablet Take 1 tablet (20 mg total) by mouth daily with breakfast for 5 days.   Respiratory Therapy Supplies (FLUTTER) DEVI Use as directed   sertraline (ZOLOFT) 100  MG tablet Take 100 mg by mouth daily.   simethicone (MYLICON) 80 MG chewable tablet Chew 80 mg by mouth daily.   [DISCONTINUED] aspirin EC 325 MG tablet Take 325 mg by mouth daily.    Current Facility-Administered Medications for the 07/17/21 encounter (Office Visit) with Douglas Bottcher, PA  Medication   Benralizumab SOSY 30 mg   tezepelumab-ekko (TEZSPIRE) 210 MG/1.91ML syringe 210 mg     Allergies:   Codeine, Lipitor [atorvastatin], and Pravachol [pravastatin sodium]   Social History   Socioeconomic History   Marital status: Widowed    Spouse name: Not on file   Number of children: 5   Years of education: Not on file   Highest education level:  Not on file  Occupational History   Occupation: Retired  Tobacco Use   Smoking status: Former    Packs/day: 1.00    Years: 20.00    Pack years: 20.00    Types: Cigarettes    Quit date: 03/09/1985    Years since quitting: 36.3   Smokeless tobacco: Never  Vaping Use   Vaping Use: Never used  Substance and Sexual Activity   Alcohol use: No   Drug use: No   Sexual activity: Not on file  Other Topics Concern   Not on file  Social History Narrative   Epworth Sleepiness Scale = 6 (as of 04/17/2015)   Social Determinants of Health   Financial Resource Strain: Not on file  Food Insecurity: Not on file  Transportation Needs: Not on file  Physical Activity: Not on file  Stress: Not on file  Social Connections: Not on file     Family History: The patient's family history includes Asthma in his father; Diabetes in his brother and brother; Emphysema in his father; Heart attack in his brother, brother, and father; Heart disease in his father; Stroke in his mother. There is no history of Colon cancer.  ROS:   Please see the history of present illness.     All other systems reviewed and are negative.  EKGs/Labs/Other Studies Reviewed:    The following studies were reviewed today:  CT coronary 2021 IMPRESSION: 1. Coronary  calcium score of 209. This was 49 percentile for age and sex matched control.   2. Normal coronary origin with right dominance.   3. CAD-RADS 3. Moderate stenosis in the proximal RCA and proximal portion of 1. diagonal artery. Consider symptom-guided anti-ischemic pharmacotherapy as well as risk factor modification per guideline directed care. Additional analysis with CT FFR will be submitted.   4. Moderately dilated pulmonary artery with maximum diameter 36 mm suspicious for pulmonary hypertension.   5.  PFO present.   Echo 01/03/2020  1. Left ventricular ejection fraction, by estimation, is 65 to 70%. The  left ventricle has normal function. The left ventricle has no regional  wall motion abnormalities. There is moderate to severe left ventricular  hypertrophy. Left ventricular  diastolic parameters are indeterminate.   2. Right ventricular systolic function is normal. The right ventricular  size is normal.   3. The mitral valve is normal in structure. Trivial mitral valve  regurgitation. No evidence of mitral stenosis.   4. The aortic valve is tricuspid. There is mild calcification of the  aortic valve. Aortic valve regurgitation is not visualized. Mild aortic  valve sclerosis is present, with no evidence of aortic valve stenosis.   5. The inferior vena cava is normal in size with <50% respiratory  variability, suggesting right atrial pressure of 8 mmHg.   EKG:  EKG is  ordered today.  The ekg ordered today demonstrates sinus rhythm with HR 80, old anterior infarct, old inferior infarct -stable from prior  Recent Labs: 07/13/2021: Hemoglobin 14.9; Platelets 193 07/14/2021: B Natriuretic Peptide 35.8; BUN 44; Creatinine, Ser 1.06; Potassium 4.3; Sodium 137  Recent Lipid Panel    Component Value Date/Time   TRIG 223 (H) 11/22/2019 1830     Risk Assessment/Calculations:           Physical Exam:    VS:  BP 134/76   Pulse 80   Ht '5\' 6"'$  (1.676 m)   Wt 196 lb 9.6 oz  (89.2 kg)   SpO2 96%   BMI 31.73 kg/m  Wt Readings from Last 3 Encounters:  07/17/21 196 lb 9.6 oz (89.2 kg)  07/12/21 192 lb (87.1 kg)  04/14/21 198 lb 4 oz (89.9 kg)     GEN:  Well nourished, well developed in no acute distress HEENT: Normal NECK: No JVD; No carotid bruits LYMPHATICS: No lymphadenopathy CARDIAC: RRR, no murmurs, rubs, gallops RESPIRATORY:  Clear to auscultation without rales, wheezing or rhonchi  ABDOMEN: Soft, non-tender, non-distended MUSCULOSKELETAL:  No edema; No deformity  SKIN: Warm and dry NEUROLOGIC:  Alert and oriented x 3 PSYCHIATRIC:  Normal affect   ASSESSMENT:    1. Coronary artery disease involving native coronary artery of native heart without angina pectoris   2. Essential hypertension   3. Hyperlipidemia, unspecified hyperlipidemia type   4. Chronic diastolic heart failure ([place])   5. OSA (obstructive sleep apnea)   6. Chronic respiratory failure, unspecified whether with hypoxia or hypercapnia (HCC)    PLAN:    In order of problems listed above:  Nonobstructive CAD - 325 mg aspirin --> reduce this to 81 mg ASA - no chest pain - activity limited by arthritis in his neck and chronic respiratory failure   Hypertension - PTA 6.25 mg Coreg twice daily, 10 mg amlodipine, 1 ng cardura - 20 mg lasix daily   Hyperlipidemia with LDL goal less than 55 Allergy listed to Lipitor, is taking crestor? Followed by PCP 04/01/21 LDL 46 A1c 6.3%   Chronic diastolic heart failure LVEF 65 to 70% Euvolemic on 20 mg lasix   Chronic respiratory failure - now on home O2 - this is new for him - suggest seeing a pulmonologist - [facility] to [facility], he will also speak with the [place], will need someone to manage his O2   OSA not on CPAP Not on CPAP any longer, hasn't been on CPAP in many years   Follow up with Dr. Debara Hansen in 1 year, sooner if needed.    Medication Adjustments/Labs and Tests Ordered: Current medicines are  reviewed at length with the patient today.  Concerns regarding medicines are outlined above.  Orders Placed This Encounter  Procedures   Ambulatory referral to Pulmonology   EKG 12-Lead   Meds ordered this encounter  Medications   aspirin EC 81 MG EC tablet    Sig: Take 1 tablet (81 mg total) by mouth daily.    Dispense:  90 tablet    Refill:  3    Patient Instructions  Medication Instructions:  DECREASE Aspirin to 81 mg daily   *If you need a refill on your cardiac medications before your next appointment, please call your pharmacy*  Lab Work: NONE ordered at this time of appointment   If you have labs (blood work) drawn today and your tests are completely normal, you will receive your results only by: [place] (if you have MyChart) OR A paper copy in the mail If you have any lab test that is abnormal or we need to change your treatment, we will call you to review the results.  Testing/Procedures: You have been referred to Pulmonology   Follow-Up: At [facility], you and your health needs are our priority.  As part of our continuing mission to provide you with exceptional heart care, we have created designated Provider Care Teams.  These Care Teams include your primary Cardiologist (physician) and Advanced Practice Providers (APPs -  Physician Assistants and Nurse Practitioners) who all work together to provide you with the care you need, when you need  it.  Your next appointment:   1 year(s)  The format for your next appointment:   In Person  Provider:   Pixie Casino, MD     Other Instructions   Important Information About Sugar         Signed, Douglas Hansen, [place]  07/17/2021 4:13 PM    [place]

## 2021-07-18 LAB — CULTURE, BLOOD (ROUTINE X 2)
Culture: NO GROWTH
Special Requests: ADEQUATE

## 2021-08-01 DIAGNOSIS — J4550 Severe persistent asthma, uncomplicated: Secondary | ICD-10-CM | POA: Diagnosis not present

## 2021-08-05 ENCOUNTER — Ambulatory Visit (INDEPENDENT_AMBULATORY_CARE_PROVIDER_SITE_OTHER): Payer: Medicare Other

## 2021-08-05 DIAGNOSIS — J4550 Severe persistent asthma, uncomplicated: Secondary | ICD-10-CM

## 2021-08-06 ENCOUNTER — Ambulatory Visit (INDEPENDENT_AMBULATORY_CARE_PROVIDER_SITE_OTHER): Payer: Medicare Other | Admitting: Internal Medicine

## 2021-08-06 ENCOUNTER — Encounter: Payer: Self-pay | Admitting: Internal Medicine

## 2021-08-06 VITALS — BP 120/72 | HR 72 | Ht 66.0 in | Wt 200.0 lb

## 2021-08-06 DIAGNOSIS — R0609 Other forms of dyspnea: Secondary | ICD-10-CM

## 2021-08-06 DIAGNOSIS — J449 Chronic obstructive pulmonary disease, unspecified: Secondary | ICD-10-CM

## 2021-08-06 DIAGNOSIS — I5032 Chronic diastolic (congestive) heart failure: Secondary | ICD-10-CM

## 2021-08-06 DIAGNOSIS — R0902 Hypoxemia: Secondary | ICD-10-CM | POA: Diagnosis not present

## 2021-08-06 NOTE — Patient Instructions (Addendum)
Please schedule follow up scheduled with myself in 2 months.  If my schedule is not open yet, we will contact you with a reminder closer to that time. Please call [phone] if you haven't heard from [place] a month before.   Before your next visit I would like you to have: Full set of PFTs - before next visit.  CT Chest - 1-2 weeks Overnight oximetry  Continue your breztri and tespire for asthma. Keep taking breathing treatments as they help you  Take your lasix 20 mg at the same time in the morning rather than splitting the dose. The medicine works more effectively this way, and will help prevent fluid from building up and improve your breathing.  Your breathing seems stable enough that you don't need any steroids or antibiotics today.   We will order a test to see if you need to wear oxygen at night time.   Get the CT scan of your chest so I can take a better look.

## 2021-08-06 NOTE — Addendum Note (Signed)
Addended by: Dessie Coma on: 08/06/2021 12:19 PM   Modules accepted: Orders

## 2021-08-06 NOTE — Progress Notes (Signed)
Douglas Hansen    [id number]    14-May-1945  Primary Care Physician:Collins, Hinton Dyer, DO  Referring Physician: Ledora Bottcher, [address] [place],  [place] [address] Reason for Consultation: cough and shortness of breath Date of Consultation: 08/06/2021  Chief complaint:   Chief Complaint  Patient presents with   Consult    He is having some productive cough that is green x 10 days, shortness of breath with exertion worse in last month. He has some wheezing.      HPI: Mat Stuard is a 76 y.o. man who presents for new patient evaluation of cough and shortness of breath. He has a history of asthma COPD overlap syndrome and is on tespire since Dec 2022.   Went to the ED for neck pain last week and was incidentally found to be hypoxemic and discharged home on oxygen therapy. He has 2L [place] at home now. He notes worsening dyspnea and fatigue. Having some dyspnea with ADLS. Symptoms are worse when he is laying down and also when  he is up walking around.   He has a daily morning cough which he calls his smokers cough.   He takes albuterol nebs which help his wheezing.   His symptoms got worse when he stopped taking his water pill and cut back on it. He is down to 10 mg BID.   At his last visit with allergy and asthma they had planned to add pulmicort nebs to his therapy in feb 2023.   He has OSA and never wore his CPAP.   Current Regimen: Breztri 2 puffs twice a day, Tespire.  Asthma Triggers: exertion, URIs Exacerbations in the last year: History of hospitalization or intubation: Allergy Testing: GERD:yes on omeprazole Allergic Rhinitis: singulair daily, flonase 1 spray BID,  ACT:  Asthma Control Test ACT Total Score  01/27/2021 10:00 AM 15  08/28/2020  3:00 PM 20  08/21/2019  4:00 PM 10   FeNO:   Spirometry reviewed in Feb 2023 from allergy and asthma shows severe airflow limitation FEV1 33% of predicted.   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  Exposures: lives at home with daughter and two grandsons. He wears a life alert.  Smoking history: 30 pack years 1.5 ppd x 20 years.   Social History   Occupational History   Occupation: Retired  Tobacco Use   Smoking status: Former    Packs/day: 1.00    Years: 20.00    Pack years: 20.00    Types: Cigarettes    Quit date: 03/09/1985    Years since quitting: 36.4   Smokeless tobacco: Never  Vaping Use   Vaping Use: Never used  Substance and Sexual Activity   Alcohol use: No   Drug use: No   Sexual activity: Not on file    Relevant family history:  Family History  Problem Relation Age of Onset   Emphysema Father    Asthma Father    Heart disease Father    Heart attack Father    Stroke Mother  Heart attack Brother    Diabetes Brother    Heart attack Brother    Diabetes Brother    Colon cancer Neg Hx     Past Medical History:  Diagnosis Date   Agent orange exposure    Arthritis    Asthma    Asthma with acute exacerbation 06/17/2015   COPD (chronic obstructive pulmonary disease) (HCC)    Diabetes mellitus    Hyperlipemia    Hypertension    OSA on CPAP    Pneumonia    Pneumonia due to COVID-19 virus 11/22/2019   PTSD (post-traumatic stress disorder)     Past Surgical History:  Procedure Laterality Date   CARDIAC CATHETERIZATION     right thumb surgery     TONSILLECTOMY       Physical Exam: Blood pressure 120/72, pulse 72, height '5\' 6"'$  (1.676 m), weight 200 lb (90.7 kg), SpO2 95 %. Gen:      No acute distress ENT:  no nasal polyps, mucus membranes moist Lungs:    No increased respiratory effort, symmetric chest wall excursion, clear to auscultation bilaterally, no wheezes or  crackles CV:         Regular rate and rhythm; no murmurs, rubs, or gallops.  trace pedal edema Abd:      + bowel sounds; soft, non-tender; no distension MSK: no acute synovitis of DIP or PIP joints, no mechanics hands.  Skin:      Warm and dry; no rashes Neuro: normal speech, no focal facial asymmetry Psych: alert and oriented x3, normal mood and affect   Data Reviewed/Medical Decision Making:  Independent interpretation of tests: Imaging:  Review of patient's chest xray May 2023 images revealed lwo lung volumes and left basilar opacity. Visualized portions of the lungs on the CT scan from Jan 03 2020 shows normal lungs. The patient's images have been independently reviewed by me.    PFTs: I have personally reviewed the patient's PFTs and severe airflow limitation on spirometry from Feb 2023 at allergy and asthma office.      View : No data to display.          Echocardiogram Oct 2021   1. Left ventricular ejection fraction, by estimation, is 65 to 70%. The  left ventricle has normal function. The left ventricle has no regional  wall motion abnormalities. There is moderate to severe left ventricular  hypertrophy. Left ventricular  diastolic parameters are indeterminate.   2. Right ventricular systolic function is normal. The right ventricular  size is normal.   3. The mitral valve is normal in structure. Trivial mitral valve  regurgitation. No evidence of mitral stenosis.   4. The aortic valve is tricuspid. There is mild calcification of the  aortic valve. Aortic valve regurgitation is not visualized. Mild aortic  valve sclerosis is present, with no evidence of aortic valve stenosis.   5. The inferior vena cava is normal in size with <50% respiratory  variability, suggesting right atrial pressure of 8 mmHg.    Labs:  Lab Results  Component Value Date   WBC 8.6 07/13/2021   HGB 14.9 07/13/2021   HCT 45.7 07/13/2021   MCV 93.5 07/13/2021   PLT 193 07/13/2021   Lab  Results  Component Value Date   NA 137 07/14/2021   K 4.3 07/14/2021   CL 101 07/14/2021   CO2 29 07/14/2021   Absolute eosinophil count 600 in 2021  Immunization status:  Immunization History  Administered Date(s) Administered   DTaP 05/01/2008  Influenza Split 10/24/2009, 12/10/2011, 10/28/2012, 11/09/2013   Influenza, High Dose Seasonal PF 03/08/1995, 12/07/2008, 10/26/2014, 11/05/2015, 11/10/2017, 11/23/2018   Influenza, Quadrivalent, Recombinant, Inj, Pf 11/30/2016, 12/04/2017, 12/25/2020   Influenza-Unspecified 12/24/1997, 12/07/2001, 12/28/2003, 01/07/2005, 02/07/2007, 01/08/2008, 10/07/2009, 11/10/2017, 11/04/2018   PFIZER(Purple Top)SARS-COV-2 Vaccination 04/15/2019, 05/06/2019, 02/07/2020, 12/07/2020   PNEUMOCOCCAL CONJUGATE-20 04/08/2020   Pneumococcal Conjugate-13 08/01/2013, 12/07/2013   Pneumococcal Polysaccharide-23 06/27/2008, 10/04/2015   Pneumococcal-Unspecified 10/07/2001   Td 05/01/2008   Tdap 09/06/2012   Zoster Recombinat (Shingrix) 06/29/2016   Zoster, Live 08/02/2012     I reviewed prior external note(s) from cardiology, hospital stay  I reviewed the result(s) of the labs and imaging as noted above.   I have ordered ONO, PFT, CT Chest   Assessment:  Chronic respiratory failure - requires 3LNC on exertion today Asthma COPD overlap syndrome Abnormal chest xray with left basilar atelectasis - cannot exclude endobronchial lesion Untreated OSA Peripheral eosinophilia  Plan/Recommendations: Will prescribe POC for oxygen use at home. Continue breztri and tespire and follow up with allergy as scheduled Will obtain a full set of PFTs to evaluate for restriction.  I've advised him to take his lasix 20 mg together rather than split - hopefully this helps more with his UOP Will order ONO to see if he needs nocturnal O2 from his untreated OSA Needs a CT Chest to follow up on this left basilar atelectasis.   We discussed disease management and progression at  length today.    Return to Care: Return in about 2 months (around 10/06/2021).  Lenice Llamas, MD [facility] [place]  CC: [place], Tami Lin, [place]

## 2021-08-07 ENCOUNTER — Telehealth: Payer: Self-pay | Admitting: Internal Medicine

## 2021-08-07 NOTE — Addendum Note (Signed)
Addended by: Elby Beck R on: 08/07/2021 04:56 PM   Modules accepted: Orders

## 2021-08-07 NOTE — Telephone Encounter (Signed)
Called and spoke Danielle at [employer] to let her know that ONO is on room air. She expressed understanding. Order has also been changed. Nothing further needed at this time.

## 2021-08-20 DIAGNOSIS — G4730 Sleep apnea, unspecified: Secondary | ICD-10-CM | POA: Diagnosis not present

## 2021-08-20 DIAGNOSIS — R0683 Snoring: Secondary | ICD-10-CM | POA: Diagnosis not present

## 2021-08-26 ENCOUNTER — Telehealth: Payer: Self-pay | Admitting: Internal Medicine

## 2021-08-26 NOTE — Telephone Encounter (Signed)
Received results from his ONO. Please let him know he needs to wear oxygen at night. Based on these results.

## 2021-08-26 NOTE — Telephone Encounter (Signed)
Spoke up and instructed him to wear O2 at night. Pt stated understanding. Nothing further needed at this time.

## 2021-08-28 ENCOUNTER — Other Ambulatory Visit: Payer: Self-pay

## 2021-08-28 ENCOUNTER — Emergency Department (HOSPITAL_COMMUNITY): Payer: Medicare Other

## 2021-08-28 ENCOUNTER — Ambulatory Visit
Admission: EM | Admit: 2021-08-28 | Discharge: 2021-08-28 | Disposition: A | Discharge status: Home / Self Care | Payer: Medicare Other | Attending: Internal Medicine | Admitting: Internal Medicine

## 2021-08-28 ENCOUNTER — Encounter (HOSPITAL_COMMUNITY): Payer: Self-pay

## 2021-08-28 ENCOUNTER — Emergency Department (HOSPITAL_COMMUNITY)
Admission: EM | Admit: 2021-08-28 | Discharge: 2021-08-29 | Disposition: A | Discharge status: Home / Self Care | Payer: Medicare Other | Attending: Emergency Medicine | Admitting: Emergency Medicine

## 2021-08-28 DIAGNOSIS — Z79899 Other long term (current) drug therapy: Secondary | ICD-10-CM | POA: Insufficient documentation

## 2021-08-28 DIAGNOSIS — R079 Chest pain, unspecified: Secondary | ICD-10-CM | POA: Diagnosis present

## 2021-08-28 DIAGNOSIS — R509 Fever, unspecified: Secondary | ICD-10-CM | POA: Diagnosis not present

## 2021-08-28 DIAGNOSIS — M542 Cervicalgia: Secondary | ICD-10-CM | POA: Insufficient documentation

## 2021-08-28 DIAGNOSIS — E876 Hypokalemia: Secondary | ICD-10-CM | POA: Insufficient documentation

## 2021-08-28 DIAGNOSIS — Z7984 Long term (current) use of oral hypoglycemic drugs: Secondary | ICD-10-CM | POA: Insufficient documentation

## 2021-08-28 DIAGNOSIS — Z7982 Long term (current) use of aspirin: Secondary | ICD-10-CM | POA: Insufficient documentation

## 2021-08-28 DIAGNOSIS — D72829 Elevated white blood cell count, unspecified: Secondary | ICD-10-CM | POA: Insufficient documentation

## 2021-08-28 DIAGNOSIS — M545 Low back pain, unspecified: Secondary | ICD-10-CM | POA: Insufficient documentation

## 2021-08-28 DIAGNOSIS — J4540 Moderate persistent asthma, uncomplicated: Secondary | ICD-10-CM | POA: Diagnosis not present

## 2021-08-28 DIAGNOSIS — R059 Cough, unspecified: Secondary | ICD-10-CM | POA: Diagnosis not present

## 2021-08-28 DIAGNOSIS — J9811 Atelectasis: Secondary | ICD-10-CM | POA: Diagnosis not present

## 2021-08-28 DIAGNOSIS — Z8616 Personal history of COVID-19: Secondary | ICD-10-CM | POA: Diagnosis not present

## 2021-08-28 DIAGNOSIS — J441 Chronic obstructive pulmonary disease with (acute) exacerbation: Secondary | ICD-10-CM | POA: Insufficient documentation

## 2021-08-28 DIAGNOSIS — Z20822 Contact with and (suspected) exposure to covid-19: Secondary | ICD-10-CM | POA: Diagnosis not present

## 2021-08-28 DIAGNOSIS — Z7951 Long term (current) use of inhaled steroids: Secondary | ICD-10-CM | POA: Insufficient documentation

## 2021-08-28 DIAGNOSIS — R7981 Abnormal blood-gas level: Secondary | ICD-10-CM

## 2021-08-28 DIAGNOSIS — W19XXXA Unspecified fall, initial encounter: Secondary | ICD-10-CM | POA: Insufficient documentation

## 2021-08-28 DIAGNOSIS — M549 Dorsalgia, unspecified: Secondary | ICD-10-CM

## 2021-08-28 DIAGNOSIS — M546 Pain in thoracic spine: Secondary | ICD-10-CM | POA: Diagnosis not present

## 2021-08-28 DIAGNOSIS — R9431 Abnormal electrocardiogram [ECG] [EKG]: Secondary | ICD-10-CM | POA: Diagnosis not present

## 2021-08-28 DIAGNOSIS — I10 Essential (primary) hypertension: Secondary | ICD-10-CM | POA: Diagnosis not present

## 2021-08-28 DIAGNOSIS — G8929 Other chronic pain: Secondary | ICD-10-CM | POA: Diagnosis not present

## 2021-08-28 DIAGNOSIS — R0682 Tachypnea, not elsewhere classified: Secondary | ICD-10-CM | POA: Diagnosis not present

## 2021-08-28 DIAGNOSIS — E119 Type 2 diabetes mellitus without complications: Secondary | ICD-10-CM | POA: Insufficient documentation

## 2021-08-28 LAB — CBC WITH DIFFERENTIAL/PLATELET
Abs Immature Granulocytes: 0.06 10*3/uL (ref 0.00–0.07)
Basophils Absolute: 0 10*3/uL (ref 0.0–0.1)
Basophils Relative: 0 %
Eosinophils Absolute: 0 10*3/uL (ref 0.0–0.5)
Eosinophils Relative: 0 %
HCT: 39.2 % (ref 39.0–52.0)
Hemoglobin: 13.1 g/dL (ref 13.0–17.0)
Immature Granulocytes: 1 %
Lymphocytes Relative: 9 %
Lymphs Abs: 1.1 10*3/uL (ref 0.7–4.0)
MCH: 31.1 pg (ref 26.0–34.0)
MCHC: 33.4 g/dL (ref 30.0–36.0)
MCV: 93.1 fL (ref 80.0–100.0)
Monocytes Absolute: 1.1 10*3/uL — ABNORMAL HIGH (ref 0.1–1.0)
Monocytes Relative: 9 %
Neutro Abs: 10.1 10*3/uL — ABNORMAL HIGH (ref 1.7–7.7)
Neutrophils Relative %: 81 %
Platelets: 212 10*3/uL (ref 150–400)
RBC: 4.21 MIL/uL — ABNORMAL LOW (ref 4.22–5.81)
RDW: 12.8 % (ref 11.5–15.5)
WBC: 12.4 10*3/uL — ABNORMAL HIGH (ref 4.0–10.5)
nRBC: 0 % (ref 0.0–0.2)

## 2021-08-28 LAB — COMPREHENSIVE METABOLIC PANEL
ALT: 14 U/L (ref 0–44)
AST: 20 U/L (ref 15–41)
Albumin: 3.1 g/dL — ABNORMAL LOW (ref 3.5–5.0)
Alkaline Phosphatase: 48 U/L (ref 38–126)
Anion gap: 12 (ref 5–15)
BUN: 11 mg/dL (ref 8–23)
CO2: 33 mmol/L — ABNORMAL HIGH (ref 22–32)
Calcium: 10.4 mg/dL — ABNORMAL HIGH (ref 8.9–10.3)
Chloride: 90 mmol/L — ABNORMAL LOW (ref 98–111)
Creatinine, Ser: 0.97 mg/dL (ref 0.61–1.24)
GFR, Estimated: >60 mL/min (ref >60)
Glucose, Bld: 143 mg/dL — ABNORMAL HIGH (ref 70–99)
Potassium: 3 mmol/L — ABNORMAL LOW (ref 3.5–5.1)
Sodium: 135 mmol/L (ref 135–145)
Total Bilirubin: 0.9 mg/dL (ref 0.3–1.2)
Total Protein: 7 g/dL (ref 6.5–8.1)

## 2021-08-28 LAB — PROTIME-INR
INR: 1.1 (ref 0.8–1.2)
Prothrombin Time: 14.2 seconds (ref 11.4–15.2)

## 2021-08-28 LAB — LACTIC ACID, PLASMA: Lactic Acid, Venous: 1.2 mmol/L (ref 0.5–1.9)

## 2021-08-28 LAB — APTT: aPTT: 32 seconds (ref 24–36)

## 2021-08-28 MED ORDER — ACETAMINOPHEN 325 MG PO TABS
650.0000 mg | ORAL_TABLET | Freq: Once | ORAL | Status: AC
  Administered 2021-08-28: 650 mg via ORAL

## 2021-08-28 MED ORDER — POTASSIUM CHLORIDE CRYS ER 20 MEQ PO TBCR
40.0000 meq | EXTENDED_RELEASE_TABLET | Freq: Once | ORAL | Status: AC
  Administered 2021-08-29: 40 meq via ORAL
  Filled 2021-08-28: qty 2

## 2021-08-28 MED ORDER — ACETAMINOPHEN 500 MG PO TABS
1000.0000 mg | ORAL_TABLET | Freq: Once | ORAL | Status: AC
  Administered 2021-08-29: 1000 mg via ORAL
  Filled 2021-08-28: qty 2

## 2021-08-28 NOTE — ED Triage Notes (Signed)
Pt c/o chronic neck pain.   O2 in triage 87% on 2lnc. Increased to 4lnc and notified provider.

## 2021-08-28 NOTE — Discharge Instructions (Addendum)
Patient to hospital via EMS.

## 2021-08-28 NOTE — ED Triage Notes (Signed)
Arrives EMS from [facility].   Presented to UC after a fall yesterday and having ongoing neck tenderness. Pt says he fell and landed on his butt. Denies head injury or LOC.   Went to UC and was noted to be febrile of 102.8 (given '650mg'$  tylenol) and hypoxic at 89% on his home 2L [place].

## 2021-08-28 NOTE — ED Provider Notes (Signed)
[facility] EMERGENCY DEPARTMENT Provider Note  CSN: [id number] Arrival date & time: 08/28/21 1946  Chief Complaint(s) Fall and Fever  HPI Douglas Hansen is a 76 y.o. male presented from UC for hypoxia. Noted to be febrile to 102. Has had some coughing for a couple of day. No N/V. Reports 1 day of watery diarrhea that resolved. Endorsing increased frequency. No dysuria. No abd pain. He was not aware of a fever.  Patient has a H/o Asthma/COPD on [facility] at home. He noted to be hypoxic with sats in the low 80s on room air.  He was placed on his 2 L nasal cannula and saturations came up to the high 90s.  He reported that his oxygen saturations at home are between 91% to 95%.  Patient went to [facility] for worsening neck pain.  This has been ongoing for several months.  States that he ran out of his Flexeril last night which he has been doubling up on.  He does report that the pain improves while taking Tylenol but he does not take this every day.  He did report having a mechanical fall yesterday while trying to get back into bed, stating that he lost his balance and fell onto his buttocks on the floor.  He denied any head trauma, neck trauma.  He states that the neck pain is not related to the fall yesterday.  He did report that the neck pain has gradually worsened over the past month and radiates down to his left chest.  The history is provided by the patient, medical records and a relative.    Past Medical History Past Medical History:  Diagnosis Date   Agent orange exposure    Arthritis    Asthma    Asthma with acute exacerbation 06/17/2015   COPD (chronic obstructive pulmonary disease) (HCC)    Diabetes mellitus    Hyperlipemia    Hypertension    OSA on CPAP    Pneumonia    Pneumonia due to COVID-19 virus 11/22/2019   PTSD (post-traumatic stress disorder)    Patient Active Problem List   Diagnosis Date Noted   COPD with acute exacerbation ([place]) 07/14/2021   Hyperkalemia  07/14/2021   Acute on chronic respiratory failure with hypoxia ([place]) 07/13/2021   Acute respiratory failure with hypoxia ([place]) 07/12/2021   Chest pain 01/02/2020   Hypoxia 11/22/2019   Dyslipidemia 06/03/2016   Other fatigue 06/03/2016   Claudication ([place]) 06/03/2016   Gastroesophageal reflux disease without esophagitis 06/17/2015   Acute pulmonary edema ([place]) 04/17/2015   Chronic obstructive pulmonary disease, unspecified copd, unspecified chronic bronchitis type 01/07/2015   Moderate persistent asthma 01/07/2015   Seasonal and perennial allergic rhinitis 01/07/2015   Asthma-COPD overlap syndrome (HCC)     Class: Chronic   Pneumonia due to other gram-negative bacteria ([place]) 01/30/2011   Cough 06/09/2010   COPD (chronic obstructive pulmonary disease) ([place]) 06/09/2010   OSA on CPAP    Diabetes mellitus ([place])    Hyperlipemia    Essential hypertension    Home Medication(s) Prior to Admission medications   Medication Sig Start Date End Date Taking? Authorizing Provider  albuterol (PROVENTIL) (2.5 MG/3ML) 0.083% nebulizer solution Take 3 mLs (2.5 mg total) by nebulization every 4 (four) hours as needed for wheezing or shortness of breath (coughing fits). 04/14/21  Yes Garnet Sierras, DO  albuterol (VENTOLIN HFA) 108 (90 Base) MCG/ACT inhaler INHALE 2 PUFFS BY MOUTH EVERY 4 HOURS AS NEEDED FOR WHEEZING OR SHORTNESS OF BREATH  Patient taking differently: Inhale 2 puffs into the lungs every 4 (four) hours as needed for wheezing or shortness of breath. 08/22/19  Yes Garnet Sierras, DO  amLODipine (NORVASC) 10 MG tablet Take 10 mg by mouth daily. 04/06/19  Yes [provider]  ascorbic acid (VITAMIN C) 500 MG tablet Take 1,000 mg by mouth daily.  04/06/19  Yes [provider]  aspirin EC 81 MG EC tablet Take 1 tablet (81 mg total) by mouth daily. 07/17/21  Yes Duke, Tami Lin, PA  azelastine (ASTELIN) 0.1 % nasal spray Place 1-2 sprays into both nostrils 2 (two) times daily as needed  (nasal drainage). Use in each nostril as directed Patient taking differently: Place 1 spray into both nostrils 2 (two) times daily as needed for allergies or rhinitis (nasal drainage). 04/14/21  Yes Garnet Sierras, DO  budesonide-formoterol (SYMBICORT) 80-4.5 MCG/ACT inhaler Inhale 2 puffs into the lungs 2 (two) times daily.   Yes [provider]  calcipotriene (DOVONOX) 0.005 % cream Apply 1 Application topically daily as needed (feet). 04/08/20  Yes [provider]  carvedilol (COREG) 12.5 MG tablet Take 6.25 mg by mouth 2 (two) times daily with a meal.  11/09/19  Yes [provider]  Cellulose Carmellose Sodium POWD Apply 1 application topically daily as needed (irritation). Apply to feet   Yes [provider]  Cholecalciferol (VITAMIN D-3 PO) Take 1 capsule by mouth daily.   Yes [provider]  clobetasol ointment (TEMOVATE) 5.42 % Apply 1 application  topically 2 (two) times daily as needed (rash). 11/13/19  Yes [provider]  cyclobenzaprine (FLEXERIL) 5 MG tablet Take 10 mg by mouth at bedtime as needed for muscle spasms. 08/08/21  Yes [provider]  doxazosin (CARDURA) 1 MG tablet Take 1 mg by mouth daily.   Yes [provider]  doxycycline (VIBRAMYCIN) 100 MG capsule Take 1 capsule (100 mg total) by mouth 2 (two) times daily for 7 days. 08/29/21 09/05/21 Yes Yama Nielson, Grayce Sessions, MD  EPINEPHrine 0.3 mg/0.3 mL IJ SOAJ injection Inject 0.3 mLs (0.3 mg total) into the muscle as needed for anaphylaxis. 11/16/19  Yes Padgett, Rae Halsted, MD  EYLEA 2 MG/0.05ML SOLN Place into the left eye every 30 (thirty) days. INJECTION 04/17/19  Yes [provider]  Fluocinolone Acetonide Body 0.01 % OIL Apply 1 application  topically daily as needed (dry skin). 11/09/19  Yes [provider]  furosemide (LASIX) 20 MG tablet Take 20 mg by mouth daily. 02/02/20  Yes [provider]  ketoconazole (NIZORAL) 2 % shampoo Apply  1 application  topically every Monday, Wednesday, and Friday.   Yes [provider]  metFORMIN (GLUCOPHAGE) 500 MG tablet Take 250 mg by mouth 2 (two) times daily with a meal.   Yes [provider]  montelukast (SINGULAIR) 10 MG tablet Take 1 tablet (10 mg total) by mouth at bedtime. 08/28/20  Yes Garnet Sierras, DO  Multiple Vitamins-Minerals (MULTIVITAMIN & MINERAL PO) Take 1 tablet by mouth daily. Reported on 06/17/2015   Yes [provider]  omeprazole (PRILOSEC) 20 MG capsule Take 20 mg by mouth daily.   Yes [provider]  OneTouch Delica Lancets 70W MISC daily. 09/01/19  Yes [provider]  [facility] ULTRA test strip as needed.  09/01/19  Yes [provider]  OXYGEN Inhale 2 L into the lungs continuous.   Yes [provider]  potassium chloride (KLOR-CON) 10 MEQ tablet Take 2 tablets (20 mEq  total) by mouth daily for 7 days. 08/29/21 09/05/21 Yes Chyrl Elwell, Grayce Sessions, MD  Respiratory Therapy Supplies (FLUTTER) DEVI Use as directed 04/20/16  Yes Bobbitt, Sed[place] Muta, MD  rosuvastatin (CRESTOR) 20 MG tablet Take 10 mg by mouth at bedtime. 08/11/21  Yes [provider]  sertraline (ZOLOFT) 100 MG tablet Take 100 mg by mouth daily. 04/05/19  Yes [provider]  simethicone (MYLICON) 80 MG chewable tablet Chew 80 mg by mouth daily.   Yes [provider]  Budeson-Glycopyrrol-Formoterol (BREZTRI AEROSPHERE) 160-9-4.8 MCG/ACT AERO Inhale 2 puffs into the lungs in the morning and at bedtime. with spacer and rinse mouth afterwards. Patient not taking: Reported on 08/29/2021 04/14/21   Garnet Sierras, DO  fluticasone [facility]) 50 MCG/ACT nasal spray Place 1 spray into both nostrils 2 (two) times daily as needed (nasal congestion). Patient not taking: Reported on 08/29/2021 04/14/21   Garnet Sierras, DO  methocarbamol (ROBAXIN) 500 MG tablet Take 1.5 tablets (750 mg total) by mouth every 6 (six) hours as needed for up to 30 doses for  muscle spasms (Neck Stiffness). Patient not taking: Reported on 08/29/2021 07/14/21   Antonieta Pert, MD                                                                                                                                    Allergies Codeine, Lipitor [atorvastatin], and Pravachol [pravastatin sodium]  Review of Systems Review of Systems As noted in HPI  Physical Exam Vital Signs  I have reviewed the triage vital signs BP 117/68   Pulse 77   Temp (!) 97.5 F (36.4 C) (Oral)   Resp 19   Ht '5\' 6"'$  (1.676 m)   Wt 88 kg   SpO2 92%   BMI 31.31 kg/m   Physical Exam Vitals reviewed.  Constitutional:      General: He is not in acute distress.    Appearance: He is well-developed. He is not diaphoretic.  HENT:     Head: Normocephalic and atraumatic.     Nose: Nose normal.  Eyes:     General: No scleral icterus.       Right eye: No discharge.        Left eye: No discharge.     Conjunctiva/sclera: Conjunctivae normal.     Pupils: Pupils are equal, round, and reactive to light.  Neck:   Cardiovascular:     Rate and Rhythm: Normal rate and regular rhythm.     Heart sounds: No murmur heard.    No friction rub. No gallop.  Pulmonary:     Effort: Pulmonary effort is normal. No respiratory distress.     Breath sounds: Normal breath sounds. No stridor. No wheezing, rhonchi or rales.  Chest:     Chest wall: No tenderness.  Abdominal:     General: There is no distension.     Palpations: Abdomen is soft.  Tenderness: There is no abdominal tenderness.  Musculoskeletal:     Cervical back: Normal range of motion and neck supple. Tenderness present. No bony tenderness. Muscular tenderness present. No spinous process tenderness.     Thoracic back: Tenderness present. No bony tenderness.       Back:  Skin:    General: Skin is warm and dry.     Findings: No erythema or rash.  Neurological:     Mental Status: He is alert and oriented to person, place, and time.     ED  Results and Treatments Labs (all labs ordered are listed, but only abnormal results are displayed) Labs Reviewed  COMPREHENSIVE METABOLIC PANEL - Abnormal; Notable for the following components:      Result Value   Potassium 3.0 (*)    Chloride 90 (*)    CO2 33 (*)    Glucose, Bld 143 (*)    Calcium 10.4 (*)    Albumin 3.1 (*)    All other components within normal limits  CBC WITH DIFFERENTIAL/PLATELET - Abnormal; Notable for the following components:   WBC 12.4 (*)    RBC 4.21 (*)    Neutro Abs 10.1 (*)    Monocytes Absolute 1.1 (*)    All other components within normal limits  URINALYSIS, ROUTINE W REFLEX MICROSCOPIC - Abnormal; Notable for the following components:   Color, Urine AMBER (*)    APPearance HAZY (*)    Protein, ur 30 (*)    Bacteria, UA RARE (*)    All other components within normal limits  SARS CORONAVIRUS 2 BY RT PCR  LACTIC ACID, PLASMA  PROTIME-INR  APTT                                                                                                                         EKG  EKG Interpretation  Date/Time:  Thursday August 28 2021 20:00:23 EDT Ventricular Rate:  84 PR Interval:  150 QRS Duration: 82 QT Interval:  374 QTC Calculation: 441 R Axis:   124 Text Interpretation: Normal sinus rhythm Left posterior fascicular block Cannot rule out Inferior infarct , age undetermined Cannot rule out Anterior infarct , age undetermined Abnormal ECG When compared with ECG of 02-Jan-2020 15:39, PREVIOUS ECG IS PRESENT No acute changes Confirmed by Addison Lank [phone]) on 08/28/2021 11:41:01 PM       Radiology DG Chest 2 View  Result Date: 08/28/2021 CLINICAL DATA:  Questionable sepsis. EXAM: CHEST - 2 VIEW COMPARISON:  Jul 14, 2021 FINDINGS: The lateral views limited in evaluation secondary to positioning of the patient's upper extremities. The heart size and mediastinal contours are within normal limits. Low lung volumes are noted. Mild, stable atelectasis is  seen along the medial aspect of the left lung base. There is no evidence of a pleural effusion or pneumothorax. Degenerative changes are seen throughout the thoracic spine. IMPRESSION: Low lung volumes with mild, stable left basilar atelectasis. Electronically Signed   By: Virgina Norfolk  M.D.   On: 08/28/2021 20:35    Pertinent labs & imaging results that were available during my care of the patient were reviewed by me and considered in my medical decision making (see MDM for details).  Medications Ordered in ED Medications  potassium chloride SA (KLOR-CON M) CR tablet 40 mEq (40 mEq Oral Given 08/29/21 0015)  acetaminophen (TYLENOL) tablet 1,000 mg (1,000 mg Oral Given 08/29/21 0015)                                                                                                                                     Procedures Procedures  (including critical care time)  Medical Decision Making / ED Course    Complexity of Problem:  Co-morbidities/SDOH that complicate the patient evaluation/care: Noted above in HPI  Additional history obtained: Urgent care note noted above in HPI  Patient's presenting problem/concern, DDX, and MDM listed below: Hypoxia with fever Patient currently satting well on 2 L nasal cannula. Had patient's family bring his oxygen compressor.  Tubing appeared to be kinked.  Unsure of this was due to packing. We will obtain chest x-ray to assess for pneumonia given reported fever.  Assess for pneumothorax, pulmonary edema Fall No acute injuries reported. Has chronic neck and back pain  Hospitalization Considered:  yes     Complexity of Data:   Cardiac Monitoring: The patient was maintained on a cardiac monitor.   I personally viewed and interpreted the cardiac monitored which showed an underlying rhythm of NSR EKG w/o acute ischemic changes   Laboratory Tests ordered listed below with my independent interpretation: CBC with leukocytosis.  No  anemia. Patient does have mild hypokalemia likely secondary to diuretic medication.  No renal insufficiency. Lactic acid normal. COVID-negative. UA without evidence of infection   Imaging Studies ordered listed below with my independent interpretation: Chest x-ray with bibasilar atelectasis but no obvious signs of pneumonia, pneumothorax, pulmonary edema     ED Course:    Assessment, Add'l Intervention, and Reassessment: Hypoxia Patient has remained stable on his 2 L nasal cannula. He was ambulated with and without oxygen and maintain sats above 90%. Infectious work-up did not reveal obvious source.  Possible viral  versus atypical infection. Will treat with Doxy. Hypokalemia Given Kdur Rx K+  Final Clinical Impression(s) / ED Diagnoses Final diagnoses:  Fever in adult  Cough in adult  Chronic neck and back pain  Hypokalemia   The patient appears reasonably screened and/or stabilized for discharge and I doubt any other medical condition or other [facility] requiring further screening, evaluation, or treatment in the ED at this time prior to discharge. Safe for discharge with strict return precautions.  Disposition: Discharge  Condition: Good  I have discussed the results, Dx and Tx plan with the patient/family who expressed understanding and agree(s) with the plan. Discharge instructions discussed at length. The patient/family was given strict return precautions who verbalized understanding of  the instructions. No further questions at time of discharge.    ED Discharge Orders          Ordered    doxycycline (VIBRAMYCIN) 100 MG capsule  2 times daily        08/29/21 0648    potassium chloride (KLOR-CON) 10 MEQ tablet  Daily        08/29/21 0648              Follow Up: Janie Morning, DO [place] [address] [phone]  Call  to schedule an appointment for close follow up           This chart was dictated using voice  recognition software.  Despite best efforts to proofread,  errors can occur which can change the documentation meaning.    Fatima Blank, MD 08/29/21 [phone]

## 2021-08-28 NOTE — ED Triage Notes (Signed)
Ems contacted  ?

## 2021-08-28 NOTE — ED Provider Notes (Signed)
[facility]    CSN: [id number] Arrival date & time: 08/28/21  1807      History   Chief Complaint Chief Complaint  Patient presents with   Neck Pain    HPI Douglas Hansen is a 76 y.o. male.   Patient presents with a chief complaint of a fall.  Although, I was called to physical exam room during initial triage given the oxygen was 82%.  Recheck of oxygen was 85% and patient was currently on 2 L of oxygen as he wears 2 L of oxygen at home.  Patient reports that he has had a cough over the past few days but denies any other associated symptoms, fever, sick contacts.  He endorses some intermittent increased shortness of breath that is different from baseline as well.  He has not taken any medications for symptoms.  He does have history of COPD and wears 2 L of oxygen at baseline.  Patient reports that his normal oxygen saturation is about 93% at home.  He was seen in May of this year for COPD exacerbation and was sent to the ED from urgent care for similar symptoms.  Patient's original chief complaint was for bilateral shoulder pain, neck pain, left rib pain after a fall that occurred today.  Patient reports that it was an unwitnessed fall but that he did not hit his head or lose consciousness.  He states that he lost his balance and fell backwards when he directly on his bottom.  He reports a history of chronic neck pain.  Family members report that he was having slurred speech after fall.   Neck Pain   Past Medical History:  Diagnosis Date   Agent orange exposure    Arthritis    Asthma    Asthma with acute exacerbation 06/17/2015   COPD (chronic obstructive pulmonary disease) (HCC)    Diabetes mellitus    Hyperlipemia    Hypertension    OSA on CPAP    Pneumonia    Pneumonia due to COVID-19 virus 11/22/2019   PTSD (post-traumatic stress disorder)     Patient Active Problem List   Diagnosis Date Noted   COPD with acute exacerbation ([place]) 07/14/2021   Hyperkalemia  07/14/2021   Acute on chronic respiratory failure with hypoxia ([place]) 07/13/2021   Acute respiratory failure with hypoxia ([place]) 07/12/2021   Chest pain 01/02/2020   Hypoxia 11/22/2019   Dyslipidemia 06/03/2016   Other fatigue 06/03/2016   Claudication ([place]) 06/03/2016   Gastroesophageal reflux disease without esophagitis 06/17/2015   Acute pulmonary edema ([place]) 04/17/2015   Chronic obstructive pulmonary disease, unspecified copd, unspecified chronic bronchitis type 01/07/2015   Moderate persistent asthma 01/07/2015   Seasonal and perennial allergic rhinitis 01/07/2015   Asthma-COPD overlap syndrome (HCC)     Class: Chronic   Pneumonia due to other gram-negative bacteria ([place]) 01/30/2011   Cough 06/09/2010   COPD (chronic obstructive pulmonary disease) ([place]) 06/09/2010   OSA on CPAP    Diabetes mellitus ([place])    Hyperlipemia    Essential hypertension     Past Surgical History:  Procedure Laterality Date   CARDIAC CATHETERIZATION     right thumb surgery     TONSILLECTOMY         Home Medications    Prior to Admission medications   Medication Sig Start Date End Date Taking? Authorizing Provider  albuterol (PROVENTIL) (2.5 MG/3ML) 0.083% nebulizer solution Take 3 mLs (2.5 mg total) by nebulization every 4 (four) hours as needed for  wheezing or shortness of breath (coughing fits). 04/14/21   Garnet Sierras, DO  albuterol (VENTOLIN HFA) 108 (90 Base) MCG/ACT inhaler INHALE 2 PUFFS BY MOUTH EVERY 4 HOURS AS NEEDED FOR WHEEZING OR SHORTNESS OF BREATH 08/22/19   Garnet Sierras, DO  amLODipine (NORVASC) 10 MG tablet Take 10 mg by mouth daily. 04/06/19   [provider]  ascorbic acid (VITAMIN C) 500 MG tablet Take 1,000 mg by mouth daily.  04/06/19   [provider]  aspirin EC 81 MG EC tablet Take 1 tablet (81 mg total) by mouth daily. 07/17/21   Duke, Tami Lin, PA  azelastine (ASTELIN) 0.1 % nasal spray Place 1-2 sprays into both nostrils 2 (two) times daily as needed  (nasal drainage). Use in each nostril as directed 04/14/21   Garnet Sierras, DO  Budeson-Glycopyrrol-Formoterol (BREZTRI AEROSPHERE) 160-9-4.8 MCG/ACT AERO Inhale 2 puffs into the lungs in the morning and at bedtime. with spacer and rinse mouth afterwards. 04/14/21   Garnet Sierras, DO  budesonide-formoterol (SYMBICORT) 80-4.5 MCG/ACT inhaler Inhale 2 puffs into the lungs 2 (two) times daily.    [provider]  calcipotriene (DOVONOX) 0.005 % cream Apply topically as needed. 04/08/20   [provider]  carvedilol (COREG) 12.5 MG tablet Take 6.25 mg by mouth 2 (two) times daily with a meal.  11/09/19   [provider]  Cellulose Carmellose Sodium POWD Apply 1 application topically daily as needed (irritation). Apply to feet    [provider]  chlorhexidine (PERIDEX) 0.12 % solution Take by mouth. 08/07/20   [provider]  Cholecalciferol (VITAMIN D-3 PO) Take 1 capsule by mouth daily.    [provider]  clobetasol ointment (TEMOVATE) 0.16 % Apply 1 application topically 2 (two) times daily as needed (to affected area).  11/13/19   [provider]  Diclofenac Sodium 1.5 % SOLN Apply 4 drops topically 4 (four) times daily as needed (as directed- to affected area).  10/17/14   [provider]  doxazosin (CARDURA) 1 MG tablet doxazosin 1 mg tablet  TAKE 1 TABLET BY MOUTH EVERY DAY    [provider]  EPINEPHrine 0.3 mg/0.3 mL IJ SOAJ injection Inject 0.3 mLs (0.3 mg total) into the muscle as needed for anaphylaxis. 11/16/19   Kennith Gain, MD  EYLEA 2 MG/0.05ML SOLN Place into the left eye every 30 (thirty) days. INJECTION 04/17/19   [provider]  Fluocinolone Acetonide Body 0.01 % OIL Apply 1 application topically See admin instructions. Apply a small amount to affected area daily 11/09/19   [provider]  fluticasone (FLONASE) 50 MCG/ACT nasal spray Place 1 spray into both nostrils 2 (two) times daily as  needed (nasal congestion). 04/14/21   Garnet Sierras, DO  furosemide (LASIX) 20 MG tablet Take 20 mg by mouth daily. 02/02/20   [provider]  ketoconazole (NIZORAL) 2 % shampoo Apply 1 application topically 3 (three) times a week.    [provider]  metFORMIN (GLUCOPHAGE) 500 MG tablet Take 250 mg by mouth 2 (two) times daily as needed (for elevated BGL).    [provider]  methocarbamol (ROBAXIN) 500 MG tablet Take 1.5 tablets (750 mg total) by mouth every 6 (six) hours as needed for up to 30 doses for muscle spasms (Neck Stiffness). 07/14/21   Antonieta Pert, MD  montelukast (SINGULAIR) 10 MG tablet Take 1 tablet (10 mg total) by mouth at bedtime. 08/28/20   Garnet Sierras, DO  Multiple Vitamins-Minerals (MULTIVITAMIN & MINERAL PO) Take 1 tablet by mouth daily. Reported on 06/17/2015    [provider]  omeprazole (PRILOSEC) 20 MG capsule Take 20 mg by mouth daily.    [provider]  OneTouch Delica Lancets 96V MISC daily. 09/01/19   [provider]  [facility] ULTRA test strip as needed.  09/01/19   [provider]  Respiratory Therapy Supplies (FLUTTER) DEVI Use as directed 04/20/16   Bobbitt, [place] Muta, MD  sertraline (ZOLOFT) 100 MG tablet Take 100 mg by mouth daily. 04/05/19   [provider]  simethicone (MYLICON) 80 MG chewable tablet Chew 80 mg by mouth daily.    [provider]    Family History Family History  Problem Relation Age of Onset   Emphysema Father    Asthma Father    Heart disease Father    Heart attack Father    Stroke Mother    Heart attack Brother    Diabetes Brother    Heart attack Brother    Diabetes Brother    Colon cancer Neg Hx     Social History Social History   Tobacco Use   Smoking status: Former    Packs/day: 1.00    Years: 20.00    Total pack years: 20.00    Types: Cigarettes    Quit date: 03/09/1985    Years since quitting: 36.4   Smokeless tobacco: Never  Vaping Use    Vaping Use: Never used  Substance Use Topics   Alcohol use: No   Drug use: No     Allergies   Codeine, Lipitor [atorvastatin], and Pravachol [pravastatin sodium]   Review of Systems Review of Systems Per HPI  Physical Exam Triage Vital Signs ED Triage Vitals  Enc Vitals Group     BP 08/28/21 1820 118/76     Pulse Rate 08/28/21 1820 95     Resp --      Temp 08/28/21 1820 (!) 102.8 F (39.3 C)     Temp Source 08/28/21 1820 Oral     SpO2 08/28/21 1820 (!) 82 %     Weight --      Height --      Head Circumference --      Peak Flow --      Pain Score 08/28/21 1829 0     Pain Loc --      Pain Edu? --      Excl. in [place]? --    No data found.  Updated Vital Signs BP 118/76 (BP Location: Left Arm)   Pulse 95   Temp (!) 102.8 F (39.3 C) (Oral)   SpO2 (!) 82%   Visual Acuity Right Eye Distance:   Left Eye Distance:   Bilateral Distance:    Right Eye Near:   Left Eye Near:    Bilateral Near:     Physical Exam Constitutional:      General: He is not in acute distress.    Appearance: Normal appearance. He is not toxic-appearing or diaphoretic.  HENT:     Head: Normocephalic and atraumatic.     Nose: Congestion present.  Eyes:     Extraocular Movements: Extraocular movements intact.     Conjunctiva/sclera: Conjunctivae normal.     Pupils: Pupils are equal, round, and reactive to light.  Cardiovascular:     Rate and Rhythm: Normal rate and regular rhythm.     Pulses: Normal pulses.     Heart sounds: Normal heart sounds.  Pulmonary:  Effort: Pulmonary effort is normal. No respiratory distress.     Breath sounds: No stridor. No wheezing, rhonchi or rales.     Comments: Crackles in bilateral lower lung bases Abdominal:     General: Abdomen is flat. Bowel sounds are normal.     Palpations: Abdomen is soft.  Genitourinary:    Comments: Deferred musculoskeletal exam given low oxygen saturation and referral of patient to ED.  Musculoskeletal:        General:  Normal range of motion.     Cervical back: Normal range of motion.  Skin:    General: Skin is warm and dry.  Neurological:     General: No focal deficit present.     Mental Status: He is alert and oriented to person, place, and time. Mental status is at baseline.     Cranial Nerves: Cranial nerves 2-12 are intact.     Sensory: Sensation is intact.     Motor: Motor function is intact.     Comments: Patient has shuffling gait at baseline and flexed neck due to chronic neck pain.  Psychiatric:        Mood and Affect: Mood normal.        Behavior: Behavior normal.      UC Treatments / Results  Labs (all labs ordered are listed, but only abnormal results are displayed) Labs Reviewed - No data to display  EKG   Radiology No results found.  Procedures Procedures (including critical care time)  Medications Ordered in UC Medications  acetaminophen (TYLENOL) tablet 650 mg (650 mg Oral Given 08/28/21 1836)    Initial Impression / Assessment and Plan / UC Course  I have reviewed the triage vital signs and the nursing notes.  Pertinent labs & imaging results that were available during my care of the patient were reviewed by me and considered in my medical decision making (see chart for details).     Called to physical exam room given a low oxygen saturation at 82% during initial triage. Oxygen improved to 95 with oxygen.  Patient was placed on higher dose of oxygen.  Tylenol administered in urgent care for fever given that temp was 102.8.  Discussed with patient that this warrants further evaluation and management at the hospital and suggested EMS transport.  Patient and family members were agreeable with plan and patient left via EMS transport.  Will defer further evaluation and management including musculoskeletal pain due to fall given emergent situation and limited resources here in urgent care.  Patient was advised of this. Final Clinical Impressions(s) / UC Diagnoses   Final  diagnoses:  Low oxygen saturation  Fever, unspecified  Fall, initial encounter     Discharge Instructions      Patient to hospital via EMS.     ED Prescriptions   None    PDMP not reviewed this encounter.   Teodora Medici, [place] 08/28/21 [phone]

## 2021-08-28 NOTE — Telephone Encounter (Signed)
Called patient and looked at his AVS from 5/31 with ND. I informed patient that I was not seeing that we gave him any medication in office. Patient states he was unsure if it was our office or his PCP office. He had both appts on the same day. I looked at his PCP office note and it does look like they gave him an injection. Patient states now he is having sharp shooting pains from the injection. I advised him to reach out to his PCP and if the pain gets too bad to seek emergency care. Patient verbalized understanding. Nothing further needed

## 2021-08-28 NOTE — ED Provider Triage Note (Signed)
Emergency Medicine Provider Triage Evaluation Note  Douglas Hansen , a 76 y.o. male  was evaluated in triage.  He has a history of diabetes, hypertension, hyperlipidemia, COPD on chronic oxygen 2 L nasal cannula.  He went to urgent care today for chronic neck pain.  He does report a fall yesterday but states that this was onto his bottom.  While at urgent care he was found to be hypoxic below where he typically is and had a fever of 102.  He was given Tylenol for this.  Patient denies any acute complaints including cough, vomiting.  He states that he went to urgent care today because he just could not take the pain in his neck anymore.  Review of Systems  Positive: Fever, chronic neck pain Negative: Vomiting, cough, rash  Physical Exam  BP 116/68   Pulse 87   Temp 99.6 F (37.6 C) (Oral)   Resp 18   SpO2 99%  Gen:   Awake, no distress   Resp:  Normal effort no distress MSK:   Moves extremities without difficulty  Other:  Abdomen soft, non-tender  Medical Decision Making  Medically screening exam initiated at 7:52 PM.  Appropriate orders placed.  Nixxon Faria was informed that the remainder of the evaluation will be completed by another provider, this initial triage assessment does not replace that evaluation, and the importance of remaining in the ED until their evaluation is complete.     Carlisle Cater, PA-C 08/28/21 [phone]

## 2021-08-29 DIAGNOSIS — R509 Fever, unspecified: Secondary | ICD-10-CM | POA: Diagnosis not present

## 2021-08-29 LAB — URINALYSIS, ROUTINE W REFLEX MICROSCOPIC
Bilirubin Urine: NEGATIVE
Glucose, UA: NEGATIVE mg/dL
Hgb urine dipstick: NEGATIVE
Ketones, ur: NEGATIVE mg/dL
Leukocytes,Ua: NEGATIVE
Nitrite: NEGATIVE
Protein, ur: 30 mg/dL — AB
Specific Gravity, Urine: 1.02 (ref 1.005–1.030)
pH: 5 (ref 5.0–8.0)

## 2021-08-29 LAB — SARS CORONAVIRUS 2 BY RT PCR: SARS Coronavirus 2 by RT PCR: NEGATIVE

## 2021-08-29 MED ORDER — DOXYCYCLINE HYCLATE 100 MG PO CAPS: 100.0000 mg | ORAL_CAPSULE | Freq: Two times a day (BID) | ORAL | 0 refills | Status: AC

## 2021-08-29 MED ORDER — POTASSIUM CHLORIDE ER 10 MEQ PO TBCR: 20.0000 meq | EXTENDED_RELEASE_TABLET | Freq: Every day | ORAL | 0 refills | Status: AC

## 2021-08-29 NOTE — Discharge Instructions (Signed)
For pain control you may take at 1000 mg of Tylenol every 8 hours

## 2021-08-31 NOTE — Progress Notes (Deleted)
Follow Up Note  RE: Douglas Hansen MRN: [id number] DOB: 1945-09-18 Date of Office Visit: 09/01/2021  Referring provider: Janie Morning, DO Primary care provider: Janie Morning, DO  Chief Complaint: No chief complaint on file.  History of Present Illness: I had the pleasure of seeing Douglas Hansen for a follow up visit at the [facility] [place] [facility] [place] on 08/31/2021. He is a 76 y.o. male, who is being followed for ACOS on Tezspire, allergic rhinitis and GERD. His previous allergy office visit was on 04/14/2021 with Dr. Maudie Hansen. Today is a regular follow up visit.  Asthma-COPD overlap syndrome [facility]) Past history - Usually has issues with his breathing twice a year - in the spring and fall. Follows with cardiology and apparently also with pulmonology. Ex-smoker. Nucala not covered. On Fasenra before.  Interim history - started Tezspire on 02/13/21 and states it's helping him breathe better. He doesn't have a nebulizer at home so he was not able to start budesonide neb. Using albuterol a few times per week. No prednisone since the last visit.  Today's spirometry showed mixed obstruction and restriction. Please bring in all your medications to the next visit. Daily controller medication(s):  Use Breztri 2 puffs twice a day with spacer and rinse mouth afterwards. Continue with Singulair '10mg'$  daily at night.  Continue Tezspire injections every 4 weeks - given today.  Nebulizer machine given.  May use albuterol rescue inhaler 2 puffs or nebulizer every 4 to 6 hours as needed for shortness of breath, chest tightness, coughing, and wheezing. May use albuterol rescue inhaler 2 puffs 5 to 15 minutes prior to strenuous physical activities. Monitor frequency of use.  Get spirometry at next visit. If no improvement will add on Pulmicort neb next.    Seasonal and perennial allergic rhinitis Past history - Some mild rhino conjunctivitis symptoms. 2021 bloodwork was positive to dust mites, grass pollen, tree  pollen and ragweed pollen. Interim history - only using Singulair and Flonase. Still has symptoms.  Continue appropriate allergen avoidance measures Continue montelukast 10 mg daily at bedtime. May use over the counter antihistamines such as Zyrtec (cetirizine), Claritin (loratadine), Allegra (fexofenadine), or Xyzal (levocetirizine) daily as needed. May use azelastine nasal spray 1-2 sprays per nostril twice a day as needed for drainage May use fluticasone nasal spray, one spray per nostril twice a day.  Nasal saline lavage (NeilMed) has been recommended as needed and prior to medicated nasal sprays along with instructions for proper administration.    Gastroesophageal reflux disease without esophagitis Stable. Continue appropriate reflux list on modifications and omeprazole as prescribed.   Return in about 2 months (around 06/12/2021).  07/12/2021 hospitalization: "73 y.om w/ asthma,COPDx 6-7 yrs,OSA on CPAP, diabetes, hyperlipidemia, hypertension, GERD, claudication, PTSD presented initially for neck pain times few days seen at the urgent care was found to be hypoxic to the mid 80s on room air and sent to ED where he was febrile 101F,tachycardic 90s to 100s, SBP 160s to 190s,  hypoxic to the mid to upper 80s on room air, saturating well on 2 L [place]. Lab work-up included BMP with glucose 116.  CBC within normal limits.  Lactic acid normal.  BNP 32.   Respiratory panel for flu and COVID-negative. CXR without acute abnormality.  CT C-spine showing multiple level degenerative changes but no acute changesPatient received Solu-Medrol, DuoNeb, albuterol as well as amlodipine and irbesartan in the ED and admitted to [facility] for acute hypoxic respiratory failure/COPD exacerbation, neck pain cervical spondylosis. He feels  significantly improved, not needing oxygen at rest but needing on ambulation.   Patient went further work-up due to elevated D-dimer and perfusion scan was negative.  Patient now at this time  remains stable improved approved for home oxygen during ambulation.  He feels well enough to go home and rest when I followed up in the evening. He is being discharged in medically stable condition with instruction to follow-up with PCP later this week."  08/28/2021 ER visit: "Douglas Hansen is a 76 y.o. male presented from UC for hypoxia. Noted to be febrile to 102. Has had some coughing for a couple of day. No N/V. Reports 1 day of watery diarrhea that resolved. Endorsing increased frequency. No dysuria. No abd pain. He was not aware of a fever.   Patient has a H/o Asthma/COPD on [facility] at home. He noted to be hypoxic with sats in the low 80s on room air.  He was placed on his 2 L nasal cannula and saturations came up to the high 90s.  He reported that his oxygen saturations at home are between 91% to 95%.   Patient went to [facility] for worsening neck pain.  This has been ongoing for several months.  States that he ran out of his Flexeril last night which he has been doubling up on.  He does report that the pain improves while taking Tylenol but he does not take this every day.  He did report having a mechanical fall yesterday while trying to get back into bed, stating that he lost his balance and fell onto his buttocks on the floor.  He denied any head trauma, neck trauma.  He states that the neck pain is not related to the fall yesterday.  He did report that the neck pain has gradually worsened over the past month and radiates down to his left chest."  Assessment and Plan: Douglas Hansen is a 76 y.o. male with: No problem-specific Assessment & Plan notes found for this encounter.  No follow-ups on file.  No orders of the defined types were placed in this encounter.  Lab Orders  No laboratory test(s) ordered today    Diagnostics: Spirometry:  Tracings reviewed. His effort: {Blank single:19197::"Good reproducible efforts.","It was hard to get consistent efforts and there is a question as to whether this  reflects a maximal maneuver.","Poor effort, data can not be interpreted."} FVC: ***L FEV1: ***L, ***% predicted FEV1/FVC ratio: ***% Interpretation: {Blank single:19197::"Spirometry consistent with mild obstructive disease","Spirometry consistent with moderate obstructive disease","Spirometry consistent with severe obstructive disease","Spirometry consistent with possible restrictive disease","Spirometry consistent with mixed obstructive and restrictive disease","Spirometry uninterpretable due to technique","Spirometry consistent with normal pattern","No overt abnormalities noted given today's efforts"}.  Please see scanned spirometry results for details.  Skin Testing: {Blank single:19197::"Select foods","Environmental allergy panel","Environmental allergy panel and select foods","Food allergy panel","None","Deferred due to recent antihistamines use"}. *** Results discussed with patient/family.   Medication List:  Current Outpatient Medications  Medication Sig Dispense Refill   albuterol (PROVENTIL) (2.5 MG/3ML) 0.083% nebulizer solution Take 3 mLs (2.5 mg total) by nebulization every 4 (four) hours as needed for wheezing or shortness of breath (coughing fits). 75 mL 1   albuterol (VENTOLIN HFA) 108 (90 Base) MCG/ACT inhaler INHALE 2 PUFFS BY MOUTH EVERY 4 HOURS AS NEEDED FOR WHEEZING OR SHORTNESS OF BREATH (Patient taking differently: Inhale 2 puffs into the lungs every 4 (four) hours as needed for wheezing or shortness of breath.) 42.5 g 0   amLODipine (NORVASC) 10 MG tablet Take 10 mg by mouth  daily.     ascorbic acid (VITAMIN C) 500 MG tablet Take 1,000 mg by mouth daily.      aspirin EC 81 MG EC tablet Take 1 tablet (81 mg total) by mouth daily. 90 tablet 3   azelastine (ASTELIN) 0.1 % nasal spray Place 1-2 sprays into both nostrils 2 (two) times daily as needed (nasal drainage). Use in each nostril as directed (Patient taking differently: Place 1 spray into both nostrils 2 (two) times  daily as needed for allergies or rhinitis (nasal drainage).) 30 mL 5   Budeson-Glycopyrrol-Formoterol (BREZTRI AEROSPHERE) 160-9-4.8 MCG/ACT AERO Inhale 2 puffs into the lungs in the Hansen and at bedtime. with spacer and rinse mouth afterwards. (Patient not taking: Reported on 08/29/2021) 10.7 g 5   budesonide-formoterol (SYMBICORT) 80-4.5 MCG/ACT inhaler Inhale 2 puffs into the lungs 2 (two) times daily.     calcipotriene (DOVONOX) 0.005 % cream Apply 1 Application topically daily as needed (feet).     carvedilol (COREG) 12.5 MG tablet Take 6.25 mg by mouth 2 (two) times daily with a meal.      Cellulose Carmellose Sodium POWD Apply 1 application topically daily as needed (irritation). Apply to feet     Cholecalciferol (VITAMIN D-3 PO) Take 1 capsule by mouth daily.     clobetasol ointment (TEMOVATE) 7.41 % Apply 1 application  topically 2 (two) times daily as needed (rash).     cyclobenzaprine (FLEXERIL) 5 MG tablet Take 10 mg by mouth at bedtime as needed for muscle spasms.     doxazosin (CARDURA) 1 MG tablet Take 1 mg by mouth daily.     doxycycline (VIBRAMYCIN) 100 MG capsule Take 1 capsule (100 mg total) by mouth 2 (two) times daily for 7 days. 14 capsule 0   EPINEPHrine 0.3 mg/0.3 mL IJ SOAJ injection Inject 0.3 mLs (0.3 mg total) into the muscle as needed for anaphylaxis. 2 each 1   EYLEA 2 MG/0.05ML SOLN Place into the left eye every 30 (thirty) days. INJECTION     Fluocinolone Acetonide Body 0.01 % OIL Apply 1 application  topically daily as needed (dry skin).     fluticasone (FLONASE) 50 MCG/ACT nasal spray Place 1 spray into both nostrils 2 (two) times daily as needed (nasal congestion). (Patient not taking: Reported on 08/29/2021) 16 g 5   furosemide (LASIX) 20 MG tablet Take 20 mg by mouth daily.     ketoconazole (NIZORAL) 2 % shampoo Apply 1 application  topically every Monday, Wednesday, and Friday.     metFORMIN (GLUCOPHAGE) 500 MG tablet Take 250 mg by mouth 2 (two) times daily  with a meal.     methocarbamol (ROBAXIN) 500 MG tablet Take 1.5 tablets (750 mg total) by mouth every 6 (six) hours as needed for up to 30 doses for muscle spasms (Neck Stiffness). (Patient not taking: Reported on 08/29/2021) 30 tablet 0   montelukast (SINGULAIR) 10 MG tablet Take 1 tablet (10 mg total) by mouth at bedtime. 30 tablet 5   Multiple Vitamins-Minerals (MULTIVITAMIN & MINERAL PO) Take 1 tablet by mouth daily. Reported on 06/17/2015     omeprazole (PRILOSEC) 20 MG capsule Take 20 mg by mouth daily.     OneTouch Delica Lancets 28N MISC daily.     ONETOUCH ULTRA test strip as needed.      OXYGEN Inhale 2 L into the lungs continuous.     potassium chloride (KLOR-CON) 10 MEQ tablet Take 2 tablets (20 mEq total) by mouth daily for 7 days. [place]  tablet 0   Respiratory Therapy Supplies (FLUTTER) DEVI Use as directed 1 each 0   rosuvastatin (CRESTOR) 20 MG tablet Take 10 mg by mouth at bedtime.     sertraline (ZOLOFT) 100 MG tablet Take 100 mg by mouth daily.     simethicone (MYLICON) 80 MG chewable tablet Chew 80 mg by mouth daily.     Current Facility-Administered Medications  Medication Dose Route Frequency Provider Last Rate Last Admin   Benralizumab SOSY 30 mg  30 mg Subcutaneous Q8 Weeks Rexene Alberts M, DO   30 mg at 12/31/20 1024   tezepelumab-ekko (TEZSPIRE) 210 MG/1.91ML syringe 210 mg  210 mg Subcutaneous Q28 days Garnet Sierras, DO   210 mg at 08/05/21 1345   Allergies: Allergies  Allergen Reactions   Codeine Nausea Only and Other (See Comments)    GI upset   Lipitor [Atorvastatin] Other (See Comments)    "Made my stomach burn"   Pravachol [Pravastatin Sodium] Other (See Comments)    Caused joint pain   I reviewed his past medical history, social history, family history, and environmental history and no significant changes have been reported from his previous visit.  Review of Systems  Constitutional:  Negative for appetite change, chills, fever and unexpected weight change.   HENT:  Positive for congestion and rhinorrhea.   Eyes:  Negative for itching.  Respiratory:  Positive for shortness of breath. Negative for cough, chest tightness and wheezing.   Gastrointestinal:  Negative for abdominal pain.  Skin:  Negative for rash.  Allergic/Immunologic: Positive for environmental allergies.  Neurological:  Negative for headaches.    Objective: There were no vitals taken for this visit. There is no height or weight on file to calculate BMI. Physical Exam Vitals and nursing note reviewed.  Constitutional:      Appearance: Normal appearance. He is well-developed.  HENT:     Head: Normocephalic and atraumatic.     Right Ear: Tympanic membrane and external ear normal.     Left Ear: Tympanic membrane and external ear normal.     Nose: Nose normal.     Mouth/Throat:     Mouth: Mucous membranes are moist.     Pharynx: Oropharynx is clear.  Eyes:     Conjunctiva/sclera: Conjunctivae normal.  Cardiovascular:     Rate and Rhythm: Normal rate and regular rhythm.     Heart sounds: Normal heart sounds. No murmur heard. Pulmonary:     Effort: Pulmonary effort is normal.     Breath sounds: No wheezing, rhonchi or rales.     Comments: Decreased breath sounds throughout. Musculoskeletal:     Cervical back: Neck supple.  Skin:    General: Skin is warm.     Findings: No rash.  Neurological:     Mental Status: He is alert and oriented to person, place, and time.  Psychiatric:        Behavior: Behavior normal.    Previous notes and tests were reviewed. The plan was reviewed with the patient/family, and all questions/concerned were addressed.  It was my pleasure to see Governor today and participate in his care. Please feel free to contact me with any questions or concerns.  Sincerely,  Rexene Alberts, DO Allergy & Immunology  [facility] office: [place] office: [phone]

## 2021-09-01 ENCOUNTER — Inpatient Hospital Stay: Admission: RE | Admit: 2021-09-01 | Payer: TRICARE For Life (TFL) | Source: Ambulatory Visit

## 2021-09-01 ENCOUNTER — Ambulatory Visit: Payer: Medicare Other | Admitting: Allergy

## 2021-09-01 DIAGNOSIS — J449 Chronic obstructive pulmonary disease, unspecified: Secondary | ICD-10-CM

## 2021-09-01 DIAGNOSIS — J302 Other seasonal allergic rhinitis: Secondary | ICD-10-CM

## 2021-09-01 DIAGNOSIS — K219 Gastro-esophageal reflux disease without esophagitis: Secondary | ICD-10-CM

## 2021-09-02 ENCOUNTER — Ambulatory Visit: Payer: Medicare Other

## 2021-09-03 DIAGNOSIS — I5032 Chronic diastolic (congestive) heart failure: Secondary | ICD-10-CM | POA: Diagnosis not present

## 2021-09-03 DIAGNOSIS — J449 Chronic obstructive pulmonary disease, unspecified: Secondary | ICD-10-CM | POA: Diagnosis not present

## 2021-09-03 DIAGNOSIS — Z9981 Dependence on supplemental oxygen: Secondary | ICD-10-CM | POA: Diagnosis not present

## 2021-09-03 DIAGNOSIS — M47819 Spondylosis without myelopathy or radiculopathy, site unspecified: Secondary | ICD-10-CM | POA: Diagnosis not present

## 2021-09-03 DIAGNOSIS — E1151 Type 2 diabetes mellitus with diabetic peripheral angiopathy without gangrene: Secondary | ICD-10-CM | POA: Diagnosis not present

## 2021-09-05 DIAGNOSIS — E785 Hyperlipidemia, unspecified: Secondary | ICD-10-CM | POA: Diagnosis not present

## 2021-09-05 DIAGNOSIS — I10 Essential (primary) hypertension: Secondary | ICD-10-CM | POA: Diagnosis not present

## 2021-09-05 DIAGNOSIS — E118 Type 2 diabetes mellitus with unspecified complications: Secondary | ICD-10-CM | POA: Diagnosis not present

## 2021-09-05 DIAGNOSIS — E559 Vitamin D deficiency, unspecified: Secondary | ICD-10-CM | POA: Diagnosis not present

## 2021-10-06 ENCOUNTER — Ambulatory Visit
Admission: RE | Admit: 2021-10-06 | Discharge: 2021-10-06 | Disposition: A | Discharge status: Home / Self Care | Payer: Medicare Other | Source: Ambulatory Visit | Attending: Internal Medicine | Admitting: Internal Medicine

## 2021-10-06 DIAGNOSIS — J439 Emphysema, unspecified: Secondary | ICD-10-CM | POA: Diagnosis not present

## 2021-10-06 DIAGNOSIS — J9811 Atelectasis: Secondary | ICD-10-CM | POA: Diagnosis not present

## 2021-10-06 DIAGNOSIS — R0902 Hypoxemia: Secondary | ICD-10-CM

## 2021-10-06 DIAGNOSIS — R0609 Other forms of dyspnea: Secondary | ICD-10-CM

## 2021-10-07 ENCOUNTER — Encounter: Payer: Self-pay | Admitting: Internal Medicine

## 2021-10-07 ENCOUNTER — Ambulatory Visit (INDEPENDENT_AMBULATORY_CARE_PROVIDER_SITE_OTHER): Payer: Medicare Other | Admitting: Internal Medicine

## 2021-10-07 VITALS — BP 124/76 | HR 81 | Temp 98.0°F | Ht 66.0 in | Wt 195.8 lb

## 2021-10-07 DIAGNOSIS — R0609 Other forms of dyspnea: Secondary | ICD-10-CM | POA: Diagnosis not present

## 2021-10-07 DIAGNOSIS — R0902 Hypoxemia: Secondary | ICD-10-CM

## 2021-10-07 DIAGNOSIS — J449 Chronic obstructive pulmonary disease, unspecified: Secondary | ICD-10-CM | POA: Diagnosis not present

## 2021-10-07 LAB — PULMONARY FUNCTION TEST
DL/VA % pred: 69 %
DL/VA: 2.76 ml/min/mmHg/L
DLCO cor % pred: 41 %
DLCO cor: 9.12 ml/min/mmHg
DLCO unc % pred: 40 %
DLCO unc: 8.71 ml/min/mmHg
FEF 25-75 Post: 0.76 L/sec
FEF 25-75 Pre: 0.54 L/sec
FEF2575-%Change-Post: 40 %
FEF2575-%Pred-Post: 44 %
FEF2575-%Pred-Pre: 31 %
FEV1-%Change-Post: 3 %
FEV1-%Pred-Post: 46 %
FEV1-%Pred-Pre: 44 %
FEV1-Post: 1.14 L
FEV1-Pre: 1.1 L
FEV1FVC-%Change-Post: 0 %
FEV1FVC-%Pred-Pre: 95 %
FEV6-%Change-Post: 2 %
FEV6-%Pred-Post: 50 %
FEV6-%Pred-Pre: 49 %
FEV6-Post: 1.62 L
FEV6-Pre: 1.58 L
FEV6FVC-%Change-Post: 0 %
FEV6FVC-%Pred-Post: 106 %
FEV6FVC-%Pred-Pre: 107 %
FVC-%Change-Post: 3 %
FVC-%Pred-Post: 47 %
FVC-%Pred-Pre: 46 %
FVC-Post: 1.65 L
FVC-Pre: 1.6 L
Post FEV1/FVC ratio: 69 %
Post FEV6/FVC ratio: 99 %
Pre FEV1/FVC ratio: 69 %
Pre FEV6/FVC Ratio: 99 %
RV % pred: 92 %
RV: 2.22 L
TLC % pred: 63 %
TLC: 3.95 L

## 2021-10-07 NOTE — Progress Notes (Signed)
Douglas Hansen    [id number]    Oct 22, 1945  Primary Care [facility], Hinton Dyer, DO Date of Appointment: 10/07/2021 Established Patient Visit  Chief complaint:   Chief Complaint  Patient presents with   Follow-up    PFT review, DOE unchanged      HPI: Douglas Hansen  is 76 y.o. man with history of asthma copd overlap syndrome on tespire since Dec 2022. On home oxygen 2LNC.  Interval Updates: Here for follow up after PFTs which show severe airflow limitation.  No interval hospitalizations or ED visits for his breathing, but he had a visit last month for neck pain related to his arthritis.   Follows with allergy and asthma with Dr. Nelva Bush as well.    Current Regimen: Breztri 2 puffs twice a day, Tespire.  Asthma Triggers: exertion, URIs Exacerbations in the last year: none History of hospitalization or intubation: never Allergy Testing: done August 2023 - multiple sensitivities to dust mites, grasses and trees GERD:yes on omeprazole Allergic Rhinitis: singulair daily, flonase 1 spray BID ACT:  Asthma Control Test ACT Total Score  01/27/2021 10:00 AM 15  08/28/2020  3:00 PM 20  08/21/2019  4:00 PM 10   FeNO:   Spirometry reviewed in Feb 2023 from allergy and asthma shows severe airflow limitation FEV1 33% of predicted.    Previous PFTs reviewed in old notes from 2012  - PFT's 06/03/10   FEV1  1.60  FVC 3liters so ratio 54     - PFT's 07/29/2010           1.9 (73%)  FVC 2.62 so ratio 76 and nl dlco  I have reviewed the patient's family social and past medical history and updated as appropriate.   Past Medical History:  Diagnosis Date   Agent orange exposure    Arthritis    Asthma    Asthma with acute exacerbation 06/17/2015   COPD (chronic obstructive pulmonary disease) (HCC)    Diabetes mellitus    Hyperlipemia    Hypertension    OSA on CPAP    Pneumonia    Pneumonia due to COVID-19 virus 11/22/2019   PTSD (post-traumatic stress disorder)     Past  Surgical History:  Procedure Laterality Date   CARDIAC CATHETERIZATION     right thumb surgery     TONSILLECTOMY      Family History  Problem Relation Age of Onset   Emphysema Father    Asthma Father    Heart disease Father    Heart attack Father    Stroke Mother    Heart attack Brother    Diabetes Brother    Heart attack Brother    Diabetes Brother    Colon cancer Neg Hx     Social History   Occupational History   Occupation: Retired  Tobacco Use   Smoking status: Former    Packs/day: 1.00    Years: 20.00    Total pack years: 20.00    Types: Cigarettes    Quit date: 03/09/1985    Years since quitting: 36.6   Smokeless tobacco: Never  Vaping Use   Vaping Use: Never used  Substance and Sexual Activity   Alcohol use: No   Drug use: No   Sexual activity: Not on file     Physical Exam: Blood pressure 124/76, pulse 81, temperature 98 F (36.7 C), temperature source Oral, height '5\' 6"'$  (1.676 m), weight 195 lb 12.8 oz (88.8 kg), SpO2 98 %.  Gen:      No acute distress, in wheelchair ENT:  on nasal cannula with 2L POC Lungs:    diminished no wheezes or crackles CV:         Regular rate and rhythm; no murmurs, rubs, or gallops.  No pedal edema   Data Reviewed: Imaging: I have personally reviewed the CT Chest from July 2023 which shows areas of paraseptal emphysema mild bronchial wall thickening. No nodules or lymphadenopathy. No focal atelectasis concerning for endobronchial lesion.   PFTs:     Latest Ref Rng & Units 10/07/2021    1:00 PM  PFT Results  FVC-Pre L 1.60  P  FVC-Predicted Pre % 46  P  FVC-Post L 1.65  P  FVC-Predicted Post % 47  P  Pre FEV1/FVC % % 69  P  Post FEV1/FCV % % 69  P  FEV1-Pre L 1.10  P  FEV1-Predicted Pre % 44  P  FEV1-Post L 1.14  P  DLCO uncorrected ml/min/mmHg 8.71  P  DLCO UNC% % 40  P  DLCO corrected ml/min/mmHg 9.12  P  DLCO COR %Predicted % 41  P  DLVA Predicted % 69  P  TLC L 3.95  P  TLC % Predicted % 63  P  RV %  Predicted % 92  P    P Preliminary result   I have personally reviewed the patient's PFTs and severe airflow limitation FEV1 44% of predicted without BD response. Also has mixed restriction with reduced DLCO.   Labs:  Immunization status: Immunization History  Administered Date(s) Administered   DTaP 05/01/2008   Influenza Split 10/24/2009, 12/10/2011, 10/28/2012, 11/09/2013   Influenza, High Dose Seasonal PF 03/08/1995, 12/07/2008, 10/26/2014, 11/05/2015, 11/10/2017, 11/23/2018   Influenza, Quadrivalent, Recombinant, Inj, Pf 11/30/2016, 12/04/2017, 12/25/2020   Influenza-Unspecified 12/24/1997, 12/07/2001, 12/28/2003, 01/07/2005, 02/07/2007, 01/08/2008, 10/07/2009, 11/10/2017, 11/04/2018   PFIZER(Purple Top)SARS-COV-2 Vaccination 04/15/2019, 05/06/2019, 02/07/2020, 12/07/2020   PNEUMOCOCCAL CONJUGATE-20 04/08/2020   Pneumococcal Conjugate-13 08/01/2013, 12/07/2013   Pneumococcal Polysaccharide-23 06/27/2008, 10/04/2015   Pneumococcal-Unspecified 10/07/2001   Td 05/01/2008   Tdap 09/06/2012   Zoster Recombinat (Shingrix) 06/29/2016   Zoster, Live 08/02/2012    External Records Personally Reviewed: allergy and asthma, ED visit  Assessment:  Gold Stage IV Asthma COPD overlap syndrome FEV1 44% of predicted Chronic respiratory failure Chronic allergic rhinitis GERD  Plan/Recommendations: Reviewed PFTs and CT scan with him. Given worsening deconditionig and dsypnea, will refer to pulmonary rehab.  Continue tespire and bretzri Continue flonase and montelukast Continue PPI   Return to Care: Return in about 3 months (around 01/07/2022).   Lenice Llamas, MD Pulmonary and [place]

## 2021-10-07 NOTE — Progress Notes (Signed)
PFT done today. 

## 2021-10-07 NOTE — Patient Instructions (Addendum)
Please schedule follow up scheduled with myself in 3 months.  If my schedule is not open yet, we will contact you with a reminder closer to that time. Please call [phone] if you haven't heard from [place] a month before.   I am putting in referral to pulmonary rehab at [facility]. They will contact you to schedule this - there is a little bit of a wait list.   Continue breztri and tespire   Continue your allergy medications. Continue the reflux medications.   Let me know if any issues

## 2021-10-31 ENCOUNTER — Telehealth (HOSPITAL_COMMUNITY): Payer: Self-pay

## 2021-10-31 ENCOUNTER — Encounter (HOSPITAL_COMMUNITY): Payer: Self-pay

## 2021-10-31 NOTE — Telephone Encounter (Signed)
Called to confirm appt. Pt confirmed appt. Instructed pt on proper footwear and COVID screening. Gave directions along with department number.   

## 2021-11-03 ENCOUNTER — Encounter (HOSPITAL_COMMUNITY): Payer: Self-pay

## 2021-11-03 ENCOUNTER — Encounter (HOSPITAL_COMMUNITY)
Admission: RE | Admit: 2021-11-03 | Discharge: 2021-11-03 | Disposition: A | Discharge status: Home / Self Care | Payer: Medicare Other | Source: Ambulatory Visit | Attending: Internal Medicine | Admitting: Internal Medicine

## 2021-11-03 VITALS — BP 124/62 | HR 80 | Ht 66.0 in

## 2021-11-03 DIAGNOSIS — J449 Chronic obstructive pulmonary disease, unspecified: Secondary | ICD-10-CM | POA: Diagnosis not present

## 2021-11-03 LAB — GLUCOSE, CAPILLARY: Glucose-Capillary: 127 mg/dL — ABNORMAL HIGH (ref 70–99)

## 2021-11-03 NOTE — Progress Notes (Signed)
Douglas Hansen 76 y.o. male Pulmonary Rehab Orientation Note This patient who was referred to Pulmonary Rehab by Dr. Shearon Stalls with the diagnosis of COPD stage 3 arrived today in Cardiac and Pulmonary Rehab. He arrived ambulatory with normal gait. He does carry portable oxygen. [employer] is the provider for their DME. Per patient, [name] uses oxygen continuously. Color good, skin warm and dry. Patient is oriented to time and place. Patient's medical history, psychosocial health, and medications reviewed. Psychosocial assessment reveals patient lives with alone. [name] is currently retired. Patient hobbies include watching tv and playing cards . Patient reports his stress level is low. Areas of stress/anxiety include health. Patient does exhibit signs of depression. Signs of depression include difficulty maintaining sleep. PHQ2/9 score 1/4. Kolsen shows fair  coping skills with positive outlook on life. Offered emotional support and reassurance. Will continue to monitor and evaluate progress toward psychosocial goal(s) of improving stress and QOL scores. Physical assessment performed by Maurice Small RN. Please see their orientation physical assessment note. [name] reports he  does take medications as prescribed. Patient states he  follows a regular  diet. The patient has been trying to lose weight through a healthy diet and exercise program.. Patient's weight will be monitored closely. Demonstration and practice of PLB using pulse oximeter. [name] able to return demonstration satisfactorily. Safety and hand hygiene in the exercise area reviewed with patient. Warnell voices understanding of the information reviewed. Department expectations discussed with patient and achievable goals were set. The patient shows enthusiasm about attending the program and we look forward to working with Mitzi Hansen. [name] completed a 6 min walk test today and is scheduled to begin exercise on 11/11/21 at 1:15 pm.   [phone] Sheppard Plumber,  MS, ACSM-CEP

## 2021-11-03 NOTE — Progress Notes (Signed)
Pulmonary Individual Treatment Plan  Patient Details  Name: Douglas Hansen MRN: [id number] Date of Birth: 1945-10-28 Referring Provider:   April Manson Pulmonary Rehab Walk Test from 11/03/2021 in [place]  Referring Provider Shearon Stalls       Initial Encounter Date:  Flowsheet Row Pulmonary Rehab Walk Test from 11/03/2021 in [place]  Date 11/03/21       Visit Diagnosis: Stage 3 severe COPD by GOLD classification ([place])  Patient's Home Medications on Admission:   Current Outpatient Medications:    albuterol (PROVENTIL) (2.5 MG/3ML) 0.083% nebulizer solution, Take 3 mLs (2.5 mg total) by nebulization every 4 (four) hours as needed for wheezing or shortness of breath (coughing fits)., Disp: 75 mL, Rfl: 1   albuterol (VENTOLIN HFA) 108 (90 Base) MCG/ACT inhaler, INHALE 2 PUFFS BY MOUTH EVERY 4 HOURS AS NEEDED FOR WHEEZING OR SHORTNESS OF BREATH (Patient taking differently: Inhale 2 puffs into the lungs every 4 (four) hours as needed for wheezing or shortness of breath.), Disp: 42.5 g, Rfl: 0   amLODipine (NORVASC) 10 MG tablet, Take 10 mg by mouth daily., Disp: , Rfl:    ascorbic acid (VITAMIN C) 500 MG tablet, Take 1,000 mg by mouth daily. , Disp: , Rfl:    aspirin EC 81 MG EC tablet, Take 1 tablet (81 mg total) by mouth daily., Disp: 90 tablet, Rfl: 3   azelastine (ASTELIN) 0.1 % nasal spray, Place 1-2 sprays into both nostrils 2 (two) times daily as needed (nasal drainage). Use in each nostril as directed (Patient taking differently: Place 1 spray into both nostrils 2 (two) times daily as needed for allergies or rhinitis (nasal drainage).), Disp: 30 mL, Rfl: 5   Budeson-Glycopyrrol-Formoterol (BREZTRI AEROSPHERE) 160-9-4.8 MCG/ACT AERO, Inhale 2 puffs into the lungs in the morning and at bedtime. with spacer and rinse mouth afterwards., Disp: 10.7 g, Rfl: 5   budesonide-formoterol (SYMBICORT) 80-4.5 MCG/ACT inhaler, Inhale 2  puffs into the lungs 2 (two) times daily., Disp: , Rfl:    calcipotriene (DOVONOX) 0.005 % cream, Apply 1 Application topically daily as needed (feet)., Disp: , Rfl:    carvedilol (COREG) 12.5 MG tablet, Take 6.25 mg by mouth 2 (two) times daily with a meal. , Disp: , Rfl:    Cellulose Carmellose Sodium POWD, Apply 1 application topically daily as needed (irritation). Apply to feet, Disp: , Rfl:    Cholecalciferol (VITAMIN D-3 PO), Take 1 capsule by mouth daily., Disp: , Rfl:    clobetasol ointment (TEMOVATE) 8.56 %, Apply 1 application  topically 2 (two) times daily as needed (rash)., Disp: , Rfl:    cyclobenzaprine (FLEXERIL) 5 MG tablet, Take 10 mg by mouth at bedtime as needed for muscle spasms., Disp: , Rfl:    doxazosin (CARDURA) 1 MG tablet, Take 1 mg by mouth daily., Disp: , Rfl:    EPINEPHrine 0.3 mg/0.3 mL IJ SOAJ injection, Inject 0.3 mLs (0.3 mg total) into the muscle as needed for anaphylaxis., Disp: 2 each, Rfl: 1   EYLEA 2 MG/0.05ML SOLN, Place into the left eye every 30 (thirty) days. INJECTION, Disp: , Rfl:    Fluocinolone Acetonide Body 0.01 % OIL, Apply 1 application  topically daily as needed (dry skin)., Disp: , Rfl:    fluticasone (FLONASE) 50 MCG/ACT nasal spray, Place 1 spray into both nostrils 2 (two) times daily as needed (nasal congestion)., Disp: 16 g, Rfl: 5   furosemide (LASIX) 20 MG tablet, Take 20 mg by  mouth daily., Disp: , Rfl:    ketoconazole (NIZORAL) 2 % shampoo, Apply 1 application  topically every Monday, Wednesday, and Friday., Disp: , Rfl:    metFORMIN (GLUCOPHAGE) 500 MG tablet, Take 250 mg by mouth 2 (two) times daily with a meal., Disp: , Rfl:    methocarbamol (ROBAXIN) 500 MG tablet, Take 1.5 tablets (750 mg total) by mouth every 6 (six) hours as needed for up to 30 doses for muscle spasms (Neck Stiffness)., Disp: 30 tablet, Rfl: 0   montelukast (SINGULAIR) 10 MG tablet, Take 1 tablet (10 mg total) by mouth at bedtime., Disp: 30 tablet, Rfl: 5   Multiple  Vitamins-Minerals (MULTIVITAMIN & MINERAL PO), Take 1 tablet by mouth daily. Reported on 06/17/2015, Disp: , Rfl:    omeprazole (PRILOSEC) 20 MG capsule, Take 20 mg by mouth daily., Disp: , Rfl:    OneTouch Delica Lancets 73A MISC, daily., Disp: , Rfl:    ONETOUCH ULTRA test strip, as needed. , Disp: , Rfl:    OXYGEN, Inhale 2 L into the lungs continuous., Disp: , Rfl:    Respiratory Therapy Supplies (FLUTTER) DEVI, Use as directed, Disp: 1 each, Rfl: 0   rosuvastatin (CRESTOR) 20 MG tablet, Take 10 mg by mouth at bedtime., Disp: , Rfl:    sertraline (ZOLOFT) 100 MG tablet, Take 100 mg by mouth daily., Disp: , Rfl:    simethicone (MYLICON) 80 MG chewable tablet, Chew 80 mg by mouth daily., Disp: , Rfl:    potassium chloride (KLOR-CON) 10 MEQ tablet, Take 2 tablets (20 mEq total) by mouth daily for 7 days., Disp: 14 tablet, Rfl: 0  Current Facility-Administered Medications:    Benralizumab SOSY 30 mg, 30 mg, Subcutaneous, Q8 Weeks, Kim, Yoon M, DO, 30 mg at 12/31/20 1024   tezepelumab-ekko (TEZSPIRE) 210 MG/1.91ML syringe 210 mg, 210 mg, Subcutaneous, Q28 days, Garnet Sierras, DO, 210 mg at 08/05/21 1345  Past Medical History: Past Medical History:  Diagnosis Date   Agent orange exposure    Arthritis    Asthma    Asthma with acute exacerbation 06/17/2015   COPD (chronic obstructive pulmonary disease) ([place])    Diabetes mellitus    Hyperlipemia    Hypertension    OSA on CPAP    Pneumonia    Pneumonia due to COVID-19 virus 11/22/2019   PTSD (post-traumatic stress disorder)     Tobacco Use: Social History   Tobacco Use  Smoking Status Former   Packs/day: 1.00   Years: 20.00   Total pack years: 20.00   Types: Cigarettes   Quit date: 03/09/1985   Years since quitting: 36.6  Smokeless Tobacco Never    Labs: Review Flowsheet       Latest Ref Rng & Units 02/06/2012 11/22/2019 01/02/2020  Labs for ITP Cardiac and Pulmonary Rehab  Trlycerides <150 mg/dL - 223  -  Hemoglobin A1c 4.8 -  5.6 % - - 6.7   TCO2 0 - 100 mmol/L 29  - -    Capillary Blood Glucose: Lab Results  Component Value Date   GLUCAP 127 (H) 11/03/2021   GLUCAP 150 (H) 07/14/2021   GLUCAP 139 (H) 07/14/2021   GLUCAP 136 (H) 07/14/2021   GLUCAP 192 (H) 07/13/2021     Pulmonary Assessment Scores:  Pulmonary Assessment Scores     Row Name 11/03/21 1130         ADL UCSD   ADL Phase Entry     SOB Score total 24  CAT Score   CAT Score 20       mMRC Score   mMRC Score 4             UCSD: Self-administered rating of dyspnea associated with activities of daily living (ADLs) 6-point scale (0 = "not at all" to 5 = "maximal or unable to do because of breathlessness")  Scoring Scores range from 0 to 120.  Minimally important difference is 5 units  CAT: CAT can identify the health impairment of COPD patients and is better correlated with disease progression.  CAT has a scoring range of zero to 40. The CAT score is classified into four groups of low (less than 10), medium (10 - 20), high (21-30) and very high (31-40) based on the impact level of disease on health status. A CAT score over 10 suggests significant symptoms.  A worsening CAT score could be explained by an exacerbation, poor medication adherence, poor inhaler technique, or progression of COPD or comorbid conditions.  CAT MCID is 2 points  mMRC: mMRC (Modified Medical Research Council) Dyspnea Scale is used to assess the degree of baseline functional disability in patients of respiratory disease due to dyspnea. No minimal important difference is established. A decrease in score of 1 point or greater is considered a positive change.   Pulmonary Function Assessment:  Pulmonary Function Assessment - 11/03/21 1129       Breath   Bilateral Breath Sounds Clear    Shortness of Breath Yes;Limiting activity             Exercise Target Goals: Exercise Program Goal: Individual exercise prescription set using results from  initial 6 min walk test and THRR while considering  patient's activity barriers and safety.   Exercise Prescription Goal: Initial exercise prescription builds to 30-45 minutes a day of aerobic activity, 2-3 days per week.  Home exercise guidelines will be given to patient during program as part of exercise prescription that the participant will acknowledge.  Activity Barriers & Risk Stratification:  Activity Barriers & Cardiac Risk Stratification - 11/03/21 1131       Activity Barriers & Cardiac Risk Stratification   Activity Barriers Arthritis;Shortness of Breath;Muscular Weakness;Deconditioning;History of Falls;Balance Concerns;Neck/Spine Problems             6 Minute Walk:  6 Minute Walk     Row Name 11/03/21 1253         6 Minute Walk   Phase Initial     Distance 744 feet     Walk Time 6 minutes     # of Rest Breaks 0     MPH 1.41     METS 1.2     RPE 12     Perceived Dyspnea  2     VO2 Peak 4.19     Symptoms No     Resting HR 80 bpm     Resting BP 124/62     Resting Oxygen Saturation  99 %     Exercise Oxygen Saturation  during 6 min walk 94 %     Max Ex. HR 90 bpm     Max Ex. BP 140/64     2 Minute Post BP 120/64       Interval HR   1 Minute HR 80     2 Minute HR 79     3 Minute HR 83     4 Minute HR 90     5 Minute HR 89     6  Minute HR 89     2 Minute Post HR 74     Interval Heart Rate? Yes       Interval Oxygen   Interval Oxygen? Yes     Baseline Oxygen Saturation % 99 %     1 Minute Oxygen Saturation % 100 %     1 Minute Liters of Oxygen 2 L     2 Minute Oxygen Saturation % 99 %     2 Minute Liters of Oxygen 2 L     3 Minute Oxygen Saturation % 98 %     3 Minute Liters of Oxygen 2 L     4 Minute Oxygen Saturation % 97 %     4 Minute Liters of Oxygen 2 L     5 Minute Oxygen Saturation % 94 %     5 Minute Liters of Oxygen 2 L     6 Minute Oxygen Saturation % 96 %     6 Minute Liters of Oxygen 2 L     2 Minute Post Oxygen Saturation % 100 %      2 Minute Post Liters of Oxygen 2 L              Oxygen Initial Assessment:  Oxygen Initial Assessment - 11/03/21 1129       Home Oxygen   Home Oxygen Device Portable Concentrator;Home Concentrator    Sleep Oxygen Prescription Continuous    Liters per minute 2    Home Exercise Oxygen Prescription Continuous    Liters per minute 2    Home Resting Oxygen Prescription Continuous    Liters per minute 2    Compliance with Home Oxygen Use Yes      Initial 6 min Walk   Oxygen Used Continuous    Liters per minute 2      Program Oxygen Prescription   Program Oxygen Prescription Continuous    Liters per minute 2      Intervention   Short Term Goals To learn and exhibit compliance with exercise, home and travel O2 prescription;To learn and understand importance of monitoring SPO2 with pulse oximeter and demonstrate accurate use of the pulse oximeter.;To learn and understand importance of maintaining oxygen saturations>88%;To learn and demonstrate proper pursed lip breathing techniques or other breathing techniques. ;To learn and demonstrate proper use of respiratory medications    Long  Term Goals Exhibits compliance with exercise, home  and travel O2 prescription;Verbalizes importance of monitoring SPO2 with pulse oximeter and return demonstration;Maintenance of O2 saturations>88%;Exhibits proper breathing techniques, such as pursed lip breathing or other method taught during program session;Compliance with respiratory medication;Demonstrates proper use of MDI's             Oxygen Re-Evaluation:   Oxygen Discharge (Final Oxygen Re-Evaluation):   Initial Exercise Prescription:  Initial Exercise Prescription - 11/03/21 1400       Date of Initial Exercise RX and Referring Provider   Date 11/03/21    Referring Provider Shearon Stalls    Expected Discharge Date 01/13/22      NuStep   Level 1    SPM 70    Minutes 15      Arm Ergometer   Level 1    Watts 1    RPM 60     Minutes 15      Prescription Details   Frequency (times per week) 2    Duration Progress to 30 minutes of continuous aerobic without signs/symptoms of physical distress  Intensity   THRR 40-80% of Max Heartrate 57-114    Ratings of Perceived Exertion 11-13    Perceived Dyspnea 0-4      Progression   Progression Continue to progress workloads to maintain intensity without signs/symptoms of physical distress.      Resistance Training   Training Prescription Yes    Weight red    Reps 10-15             Perform Capillary Blood Glucose checks as needed.  Exercise Prescription Changes:   Exercise Comments:   Exercise Goals and Review:   Exercise Goals     Row Name 11/03/21 1419             Exercise Goals   Increase Physical Activity Yes       Intervention Provide advice, education, support and counseling about physical activity/exercise needs.;Develop an individualized exercise prescription for aerobic and resistive training based on initial evaluation findings, risk stratification, comorbidities and participant's personal goals.       Expected Outcomes Short Term: Attend rehab on a regular basis to increase amount of physical activity.;Long Term: Add in home exercise to make exercise part of routine and to increase amount of physical activity.;Long Term: Exercising regularly at least 3-5 days a week.       Increase Strength and Stamina Yes       Intervention Provide advice, education, support and counseling about physical activity/exercise needs.;Develop an individualized exercise prescription for aerobic and resistive training based on initial evaluation findings, risk stratification, comorbidities and participant's personal goals.       Expected Outcomes Short Term: Increase workloads from initial exercise prescription for resistance, speed, and METs.;Short Term: Perform resistance training exercises routinely during rehab and add in resistance training at home;Long  Term: Improve cardiorespiratory fitness, muscular endurance and strength as measured by increased METs and functional capacity (6MWT)       Able to understand and use rate of perceived exertion (RPE) scale Yes       Intervention Provide education and explanation on how to use RPE scale       Expected Outcomes Short Term: Able to use RPE daily in rehab to express subjective intensity level;Long Term:  Able to use RPE to guide intensity level when exercising independently       Able to understand and use Dyspnea scale Yes       Intervention Provide education and explanation on how to use Dyspnea scale       Expected Outcomes Short Term: Able to use Dyspnea scale daily in rehab to express subjective sense of shortness of breath during exertion;Long Term: Able to use Dyspnea scale to guide intensity level when exercising independently       Able to check pulse independently Yes       Intervention Provide education and demonstration on how to check pulse in carotid and radial arteries.;Review the importance of being able to check your own pulse for safety during independent exercise       Expected Outcomes Short Term: Able to explain why pulse checking is important during independent exercise;Long Term: Able to check pulse independently and accurately       Understanding of Exercise Prescription Yes       Intervention Provide education, explanation, and written materials on patient's individual exercise prescription       Expected Outcomes Short Term: Able to explain program exercise prescription;Long Term: Able to explain home exercise prescription to exercise independently  Exercise Goals Re-Evaluation :   Discharge Exercise Prescription (Final Exercise Prescription Changes):   Nutrition:  Target Goals: Understanding of nutrition guidelines, daily intake of sodium '1500mg'$ , cholesterol '200mg'$ , calories 30% from fat and 7% or less from saturated fats, daily to have 5 or more  servings of fruits and vegetables.  Biometrics:  Pre Biometrics - 11/03/21 1108       Pre Biometrics   Grip Strength 24 kg              Nutrition Therapy Plan and Nutrition Goals:   Nutrition Assessments:  MEDIFICTS Score Key: ?70 Need to make dietary changes  40-70 Heart Healthy Diet ? 40 Therapeutic Level Cholesterol Diet   Picture Your Plate Scores: <17 Unhealthy dietary pattern with much room for improvement. 41-50 Dietary pattern unlikely to meet recommendations for good health and room for improvement. 51-60 More healthful dietary pattern, with some room for improvement.  >60 Healthy dietary pattern, although there may be some specific behaviors that could be improved.    Nutrition Goals Re-Evaluation:   Nutrition Goals Discharge (Final Nutrition Goals Re-Evaluation):   Psychosocial: Target Goals: Acknowledge presence or absence of significant depression and/or stress, maximize coping skills, provide positive support system. Participant is able to verbalize types and ability to use techniques and skills needed for reducing stress and depression.  Initial Review & Psychosocial Screening:  Initial Psych Review & Screening - 11/03/21 1122       Initial Review   Current issues with History of Depression;Current Depression   Pt finished PTSD support group and is on medication for his depression.     Family Dynamics   Good Support System? Yes    Comments children      Barriers   Psychosocial barriers to participate in program The patient should benefit from training in stress management and relaxation.      Screening Interventions   Interventions Encouraged to exercise    Expected Outcomes Short Term goal: Identification and review with participant of any Quality of Life or Depression concerns found by scoring the questionnaire.;Long Term goal: The participant improves quality of Life and PHQ9 Scores as seen by post scores and/or verbalization of changes              Quality of Life Scores:  Scores of 19 and below usually indicate a poorer quality of life in these areas.  A difference of  2-3 points is a clinically meaningful difference.  A difference of 2-3 points in the total score of the Quality of Life Index has been associated with significant improvement in overall quality of life, self-image, physical symptoms, and general health in studies assessing change in quality of life.  PHQ-9: Review Flowsheet       11/03/2021  Depression screen PHQ 2/9  Decreased Interest 0  Down, Depressed, Hopeless 1  PHQ - 2 Score 1  Altered sleeping 2  Tired, decreased energy 1  Change in appetite 0  Feeling bad or failure about yourself  0  Trouble concentrating 0  Moving slowly or fidgety/restless 0  Suicidal thoughts 0  PHQ-9 Score 4  Difficult doing work/chores Somewhat difficult   Interpretation of Total Score  Total Score Depression Severity:  1-4 = Minimal depression, 5-9 = Mild depression, 10-14 = Moderate depression, 15-19 = Moderately severe depression, 20-27 = Severe depression   Psychosocial Evaluation and Intervention:  Psychosocial Evaluation - 11/03/21 1125       Psychosocial Evaluation & Interventions   Interventions Stress management  education;Relaxation education;Encouraged to exercise with the program and follow exercise prescription    Expected Outcomes For [place] to participate in Pulmonary Rehab    Continue Psychosocial Services  Follow up required by staff             Psychosocial Re-Evaluation:   Psychosocial Discharge (Final Psychosocial Re-Evaluation):   Education: Education Goals: Education classes will be provided on a weekly basis, covering required topics. Participant will state understanding/return demonstration of topics presented.  Learning Barriers/Preferences:  Learning Barriers/Preferences - 11/03/21 1125       Learning Barriers/Preferences   Learning Barriers Sight   wears glasses    Learning Preferences Pictoral;Skilled Demonstration;Written Material             Education Topics: Risk Factor Reduction:  -Group instruction that is supported by a PowerPoint presentation. Instructor discusses the definition of a risk factor, different risk factors for pulmonary disease, and how the heart and lungs work together.     Nutrition for Pulmonary Patient:  -Group instruction provided by PowerPoint slides, verbal discussion, and written materials to support subject matter. The instructor gives an explanation and review of healthy diet recommendations, which includes a discussion on weight management, recommendations for fruit and vegetable consumption, as well as protein, fluid, caffeine, fiber, sodium, sugar, and alcohol. Tips for eating when patients are short of breath are discussed.   Pursed Lip Breathing:  -Group instruction that is supported by demonstration and informational handouts. Instructor discusses the benefits of pursed lip and diaphragmatic breathing and detailed demonstration on how to preform both.     Oxygen Safety:  -Group instruction provided by PowerPoint, verbal discussion, and written material to support subject matter. There is an overview of "What is Oxygen" and "Why do we need it".  Instructor also reviews how to create a safe environment for oxygen use, the importance of using oxygen as prescribed, and the risks of noncompliance. There is a brief discussion on traveling with oxygen and resources the patient may utilize.   Oxygen Equipment:  -Group instruction provided by [facility] Staff utilizing handouts, written materials, and equipment demonstrations.   Signs and Symptoms:  -Group instruction provided by written material and verbal discussion to support subject matter. Warning signs and symptoms of infection, stroke, and heart attack are reviewed and when to call the physician/911 reinforced. Tips for preventing the spread of infection  discussed.   Advanced Directives:  -Group instruction provided by verbal instruction and written material to support subject matter. Instructor reviews Advanced Directive laws and proper instruction for filling out document.   Pulmonary Video:  -Group video education that reviews the importance of medication and oxygen compliance, exercise, good nutrition, pulmonary hygiene, and pursed lip and diaphragmatic breathing for the pulmonary patient.   Exercise for the Pulmonary Patient:  -Group instruction that is supported by a PowerPoint presentation. Instructor discusses benefits of exercise, core components of exercise, frequency, duration, and intensity of an exercise routine, importance of utilizing pulse oximetry during exercise, safety while exercising, and options of places to exercise outside of rehab.     Pulmonary Medications:  -Verbally interactive group education provided by instructor with focus on inhaled medications and proper administration.   Anatomy and Physiology of the Respiratory System and Intimacy:  -Group instruction provided by PowerPoint, verbal discussion, and written material to support subject matter. Instructor reviews respiratory cycle and anatomical components of the respiratory system and their functions. Instructor also reviews differences in obstructive and restrictive respiratory diseases with examples of each.  Intimacy, Sex, and Sexuality differences are reviewed with a discussion on how relationships can change when diagnosed with pulmonary disease. Common sexual concerns are reviewed.   MD DAY -A group question and answer session with a medical doctor that allows participants to ask questions that relate to their pulmonary disease state.   OTHER EDUCATION -Group or individual verbal, written, or video instructions that support the educational goals of the pulmonary rehab program.   Holiday Eating Survival Tips:  -Group instruction provided by  PowerPoint slides, verbal discussion, and written materials to support subject matter. The instructor gives patients tips, tricks, and techniques to help them not only survive but enjoy the holidays despite the onslaught of food that accompanies the holidays.   Knowledge Questionnaire Score:  Knowledge Questionnaire Score - 11/03/21 1142       Knowledge Questionnaire Score   Pre Score 15/18             Core Components/Risk Factors/Patient Goals at Admission:  Personal Goals and Risk Factors at Admission - 11/03/21 1125       Core Components/Risk Factors/Patient Goals on Admission    Weight Management Weight Loss;Yes    Intervention Weight Management: Develop a combined nutrition and exercise program designed to reach desired caloric intake, while maintaining appropriate intake of nutrient and fiber, sodium and fats, and appropriate energy expenditure required for the weight goal.;Weight Management: Provide education and appropriate resources to help participant work on and attain dietary goals.;Weight Management/Obesity: Establish reasonable short term and long term weight goals.;Obesity: Provide education and appropriate resources to help participant work on and attain dietary goals.    Expected Outcomes Short Term: Continue to assess and modify interventions until short term weight is achieved;Long Term: Adherence to nutrition and physical activity/exercise program aimed toward attainment of established weight goal;Weight Maintenance: Understanding of the daily nutrition guidelines, which includes 25-35% calories from fat, 7% or less cal from saturated fats, less than '200mg'$  cholesterol, less than 1.5gm of sodium, & 5 or more servings of fruits and vegetables daily;Weight Loss: Understanding of general recommendations for a balanced deficit meal plan, which promotes 1-2 lb weight loss per week and includes a negative energy balance of [phone] kcal/d;Understanding recommendations for meals to  include 15-35% energy as protein, 25-35% energy from fat, 35-60% energy from carbohydrates, less than '200mg'$  of dietary cholesterol, 20-35 gm of total fiber daily;Understanding of distribution of calorie intake throughout the day with the consumption of 4-5 meals/snacks    Improve shortness of breath with ADL's Yes    Intervention Provide education, individualized exercise plan and daily activity instruction to help decrease symptoms of SOB with activities of daily living.    Expected Outcomes Short Term: Improve cardiorespiratory fitness to achieve a reduction of symptoms when performing ADLs;Long Term: Be able to perform more ADLs without symptoms or delay the onset of symptoms             Core Components/Risk Factors/Patient Goals Review:    Core Components/Risk Factors/Patient Goals at Discharge (Final Review):    ITP Comments:   Comments: Dr. Rodman Pickle is Medical Director for Pulmonary Rehab at [facility].

## 2021-11-03 NOTE — Progress Notes (Signed)
Pulmonary Rehab Orientation Physical Assessment Note  Physical assessment reveals  Pt is alert and oriented x 3.  Heart rate is normal, breath sounds clear to auscultation, no wheezes, rales, or rhonchi. Reports non-productive cough. Bowel sounds present.  Pt denies abdominal discomfort, nausea, vomiting or diarrhea. Grip strength equal, strong. Distal pulses palpable; no swelling to lower extremities. Cherre Huger, BSN Cardiac and Training and development officer

## 2021-11-04 NOTE — Progress Notes (Signed)
Douglas Hansen 76 y.o. male  Initial Psychosocial Assessment   Psychosocial assessment reveals patient lives with alone. Douglas Hansen is currently retired. Patient hobbies include watching tv and playing cards . Patient reports his stress level is low. Areas of stress/anxiety include health. Patient does exhibit signs of depression. Signs of depression include difficulty maintaining sleep. PHQ2/9 score 1/4. Douglas Hansen shows fair  coping skills with positive outlook on life. Offered emotional support and reassurance. Will continue to monitor and evaluate progress toward psychosocial goal(s).  Goal(s): Improved management of depression Improved coping skills Help patient work toward returning to meaningful activities that improve patient's QOL and are attainable with patient's lung disease   11/04/2021 7:22 AM

## 2021-11-06 DIAGNOSIS — E785 Hyperlipidemia, unspecified: Secondary | ICD-10-CM | POA: Diagnosis not present

## 2021-11-06 DIAGNOSIS — E559 Vitamin D deficiency, unspecified: Secondary | ICD-10-CM | POA: Diagnosis not present

## 2021-11-06 DIAGNOSIS — E118 Type 2 diabetes mellitus with unspecified complications: Secondary | ICD-10-CM | POA: Diagnosis not present

## 2021-11-06 DIAGNOSIS — I10 Essential (primary) hypertension: Secondary | ICD-10-CM | POA: Diagnosis not present

## 2021-11-11 ENCOUNTER — Encounter (HOSPITAL_COMMUNITY)
Admission: RE | Admit: 2021-11-11 | Discharge: 2021-11-11 | Disposition: A | Discharge status: Home / Self Care | Payer: Medicare Other | Source: Ambulatory Visit | Attending: Internal Medicine | Admitting: Internal Medicine

## 2021-11-11 VITALS — Wt 200.0 lb

## 2021-11-11 DIAGNOSIS — J449 Chronic obstructive pulmonary disease, unspecified: Secondary | ICD-10-CM

## 2021-11-11 LAB — GLUCOSE, CAPILLARY
Glucose-Capillary: 127 mg/dL — ABNORMAL HIGH (ref 70–99)
Glucose-Capillary: 110 mg/dL — ABNORMAL HIGH (ref 70–99)

## 2021-11-11 NOTE — Progress Notes (Signed)
Daily Session Note  Patient Details  Name: Douglas Hansen MRN: [id number] Date of Birth: 01/31/1946 Referring Provider:   April Manson Pulmonary Rehab Walk Test from 11/03/2021 in [place]  Referring Provider Shearon Stalls       Encounter Date: 11/11/2021  Check In:  Session Check In - 11/11/21 1432       Check-In   Supervising physician immediately available to respond to emergencies [facility] immediately available    Physician(s) Dr. Verlon Au    Location [facility]    Staff Present Rosebud Poles, RN, BSN;Randi Olen Cordial BS, ACSM-CEP, Exercise Physiologist;Casten Floren Rosana Hoes, MS, ACSM-CEP, Exercise Physiologist;Lisa Ysidro Evert, RN    Virtual Visit No    Medication changes reported     No    Fall or balance concerns reported    No    Tobacco Cessation No Change    Warm-up and Cool-down Performed as group-led instruction    Resistance Training Performed Yes    VAD Patient? No    PAD/SET Patient? No      Pain Assessment   Currently in Pain? No/denies    Multiple Pain Sites No             Capillary Blood Glucose: No results found for this or any previous visit (from the past 24 hour(s)).   Exercise Prescription Changes - 11/11/21 1400       Response to Exercise   Blood Pressure (Admit) 124/70    Blood Pressure (Exercise) 124/70    Blood Pressure (Exit) 108/60    Heart Rate (Admit) 66 bpm    Heart Rate (Exercise) 77 bpm    Heart Rate (Exit) 70 bpm    Oxygen Saturation (Admit) 98 %    Oxygen Saturation (Exercise) 98 %    Oxygen Saturation (Exit) 96 %    Rating of Perceived Exertion (Exercise) 11    Perceived Dyspnea (Exercise) 2    Duration Progress to 30 minutes of  aerobic without signs/symptoms of physical distress    Intensity THRR unchanged      Progression   Progression Continue to progress workloads to maintain intensity without signs/symptoms of physical distress.      Resistance Training   Training Prescription Yes     Weight red bands    Reps 10-15    Time 10 Minutes      Oxygen   Oxygen Continuous    Liters 2      NuStep   Level 1    SPM 70    Minutes 15    METs 1.6      Arm Ergometer   Level 1    Watts 1    RPM 38    Minutes 15      Oxygen   Maintain Oxygen Saturation 88% or higher             Social History   Tobacco Use  Smoking Status Former   Packs/day: 1.00   Years: 20.00   Total pack years: 20.00   Types: Cigarettes   Quit date: 03/09/1985   Years since quitting: 36.7  Smokeless Tobacco Never    Goals Met:  Exercise tolerated well No report of concerns or symptoms today Strength training completed today  Goals Unmet:  Not Applicable  Comments: Service time is from 1322 to 1445.    Dr. Rodman Pickle is Medical Director for Pulmonary Rehab at [facility].

## 2021-11-12 NOTE — Progress Notes (Signed)
Pulmonary Individual Treatment Plan  Patient Details  Name: Douglas Hansen MRN: [id number] Date of Birth: 1945-10-28 Referring Provider:   April Manson Pulmonary Rehab Walk Test from 11/03/2021 in [place]  Referring Provider Shearon Stalls       Initial Encounter Date:  Flowsheet Row Pulmonary Rehab Walk Test from 11/03/2021 in [place]  Date 11/03/21       Visit Diagnosis: Stage 3 severe COPD by GOLD classification ([place])  Patient's Home Medications on Admission:   Current Outpatient Medications:    albuterol (PROVENTIL) (2.5 MG/3ML) 0.083% nebulizer solution, Take 3 mLs (2.5 mg total) by nebulization every 4 (four) hours as needed for wheezing or shortness of breath (coughing fits)., Disp: 75 mL, Rfl: 1   albuterol (VENTOLIN HFA) 108 (90 Base) MCG/ACT inhaler, INHALE 2 PUFFS BY MOUTH EVERY 4 HOURS AS NEEDED FOR WHEEZING OR SHORTNESS OF BREATH (Patient taking differently: Inhale 2 puffs into the lungs every 4 (four) hours as needed for wheezing or shortness of breath.), Disp: 42.5 g, Rfl: 0   amLODipine (NORVASC) 10 MG tablet, Take 10 mg by mouth daily., Disp: , Rfl:    ascorbic acid (VITAMIN C) 500 MG tablet, Take 1,000 mg by mouth daily. , Disp: , Rfl:    aspirin EC 81 MG EC tablet, Take 1 tablet (81 mg total) by mouth daily., Disp: 90 tablet, Rfl: 3   azelastine (ASTELIN) 0.1 % nasal spray, Place 1-2 sprays into both nostrils 2 (two) times daily as needed (nasal drainage). Use in each nostril as directed (Patient taking differently: Place 1 spray into both nostrils 2 (two) times daily as needed for allergies or rhinitis (nasal drainage).), Disp: 30 mL, Rfl: 5   Budeson-Glycopyrrol-Formoterol (BREZTRI AEROSPHERE) 160-9-4.8 MCG/ACT AERO, Inhale 2 puffs into the lungs in the morning and at bedtime. with spacer and rinse mouth afterwards., Disp: 10.7 g, Rfl: 5   budesonide-formoterol (SYMBICORT) 80-4.5 MCG/ACT inhaler, Inhale 2  puffs into the lungs 2 (two) times daily., Disp: , Rfl:    calcipotriene (DOVONOX) 0.005 % cream, Apply 1 Application topically daily as needed (feet)., Disp: , Rfl:    carvedilol (COREG) 12.5 MG tablet, Take 6.25 mg by mouth 2 (two) times daily with a meal. , Disp: , Rfl:    Cellulose Carmellose Sodium POWD, Apply 1 application topically daily as needed (irritation). Apply to feet, Disp: , Rfl:    Cholecalciferol (VITAMIN D-3 PO), Take 1 capsule by mouth daily., Disp: , Rfl:    clobetasol ointment (TEMOVATE) 8.56 %, Apply 1 application  topically 2 (two) times daily as needed (rash)., Disp: , Rfl:    cyclobenzaprine (FLEXERIL) 5 MG tablet, Take 10 mg by mouth at bedtime as needed for muscle spasms., Disp: , Rfl:    doxazosin (CARDURA) 1 MG tablet, Take 1 mg by mouth daily., Disp: , Rfl:    EPINEPHrine 0.3 mg/0.3 mL IJ SOAJ injection, Inject 0.3 mLs (0.3 mg total) into the muscle as needed for anaphylaxis., Disp: 2 each, Rfl: 1   EYLEA 2 MG/0.05ML SOLN, Place into the left eye every 30 (thirty) days. INJECTION, Disp: , Rfl:    Fluocinolone Acetonide Body 0.01 % OIL, Apply 1 application  topically daily as needed (dry skin)., Disp: , Rfl:    fluticasone (FLONASE) 50 MCG/ACT nasal spray, Place 1 spray into both nostrils 2 (two) times daily as needed (nasal congestion)., Disp: 16 g, Rfl: 5   furosemide (LASIX) 20 MG tablet, Take 20 mg by  mouth daily., Disp: , Rfl:    ketoconazole (NIZORAL) 2 % shampoo, Apply 1 application  topically every Monday, Wednesday, and Friday., Disp: , Rfl:    metFORMIN (GLUCOPHAGE) 500 MG tablet, Take 250 mg by mouth 2 (two) times daily with a meal., Disp: , Rfl:    methocarbamol (ROBAXIN) 500 MG tablet, Take 1.5 tablets (750 mg total) by mouth every 6 (six) hours as needed for up to 30 doses for muscle spasms (Neck Stiffness)., Disp: 30 tablet, Rfl: 0   montelukast (SINGULAIR) 10 MG tablet, Take 1 tablet (10 mg total) by mouth at bedtime., Disp: 30 tablet, Rfl: 5   Multiple  Vitamins-Minerals (MULTIVITAMIN & MINERAL PO), Take 1 tablet by mouth daily. Reported on 06/17/2015, Disp: , Rfl:    omeprazole (PRILOSEC) 20 MG capsule, Take 20 mg by mouth daily., Disp: , Rfl:    OneTouch Delica Lancets 41L MISC, daily., Disp: , Rfl:    ONETOUCH ULTRA test strip, as needed. , Disp: , Rfl:    OXYGEN, Inhale 2 L into the lungs continuous., Disp: , Rfl:    potassium chloride (KLOR-CON) 10 MEQ tablet, Take 2 tablets (20 mEq total) by mouth daily for 7 days., Disp: 14 tablet, Rfl: 0   Respiratory Therapy Supplies (FLUTTER) DEVI, Use as directed, Disp: 1 each, Rfl: 0   rosuvastatin (CRESTOR) 20 MG tablet, Take 10 mg by mouth at bedtime., Disp: , Rfl:    sertraline (ZOLOFT) 100 MG tablet, Take 100 mg by mouth daily., Disp: , Rfl:    simethicone (MYLICON) 80 MG chewable tablet, Chew 80 mg by mouth daily., Disp: , Rfl:   Current Facility-Administered Medications:    Benralizumab SOSY 30 mg, 30 mg, Subcutaneous, Q8 Weeks, Kim, Yoon M, DO, 30 mg at 12/31/20 1024   tezepelumab-ekko (TEZSPIRE) 210 MG/1.91ML syringe 210 mg, 210 mg, Subcutaneous, Q28 days, Garnet Sierras, DO, 210 mg at 08/05/21 1345  Past Medical History: Past Medical History:  Diagnosis Date   Agent orange exposure    Arthritis    Asthma    Asthma with acute exacerbation 06/17/2015   COPD (chronic obstructive pulmonary disease) ([place])    Diabetes mellitus    Hyperlipemia    Hypertension    OSA on CPAP    Pneumonia    Pneumonia due to COVID-19 virus 11/22/2019   PTSD (post-traumatic stress disorder)     Tobacco Use: Social History   Tobacco Use  Smoking Status Former   Packs/day: 1.00   Years: 20.00   Total pack years: 20.00   Types: Cigarettes   Quit date: 03/09/1985   Years since quitting: 36.7  Smokeless Tobacco Never    Labs: Review Flowsheet       Latest Ref Rng & Units 02/06/2012 11/22/2019 01/02/2020  Labs for ITP Cardiac and Pulmonary Rehab  Trlycerides <150 mg/dL - 223  -  Hemoglobin A1c 4.8 -  5.6 % - - 6.7   TCO2 0 - 100 mmol/L 29  - -    Capillary Blood Glucose: Lab Results  Component Value Date   GLUCAP 110 (H) 11/11/2021   GLUCAP 127 (H) 11/11/2021   GLUCAP 127 (H) 11/03/2021   GLUCAP 150 (H) 07/14/2021   GLUCAP 139 (H) 07/14/2021     Pulmonary Assessment Scores:  Pulmonary Assessment Scores     Row Name 11/03/21 1130         ADL UCSD   ADL Phase Entry     SOB Score total 24  CAT Score   CAT Score 20       mMRC Score   mMRC Score 4             UCSD: Self-administered rating of dyspnea associated with activities of daily living (ADLs) 6-point scale (0 = "not at all" to 5 = "maximal or unable to do because of breathlessness")  Scoring Scores range from 0 to 120.  Minimally important difference is 5 units  CAT: CAT can identify the health impairment of COPD patients and is better correlated with disease progression.  CAT has a scoring range of zero to 40. The CAT score is classified into four groups of low (less than 10), medium (10 - 20), high (21-30) and very high (31-40) based on the impact level of disease on health status. A CAT score over 10 suggests significant symptoms.  A worsening CAT score could be explained by an exacerbation, poor medication adherence, poor inhaler technique, or progression of COPD or comorbid conditions.  CAT MCID is 2 points  mMRC: mMRC (Modified Medical Research Council) Dyspnea Scale is used to assess the degree of baseline functional disability in patients of respiratory disease due to dyspnea. No minimal important difference is established. A decrease in score of 1 point or greater is considered a positive change.   Pulmonary Function Assessment:  Pulmonary Function Assessment - 11/03/21 1129       Breath   Bilateral Breath Sounds Clear    Shortness of Breath Yes;Limiting activity             Exercise Target Goals: Exercise Program Goal: Individual exercise prescription set using results from  initial 6 min walk test and THRR while considering  patient's activity barriers and safety.   Exercise Prescription Goal: Initial exercise prescription builds to 30-45 minutes a day of aerobic activity, 2-3 days per week.  Home exercise guidelines will be given to patient during program as part of exercise prescription that the participant will acknowledge.  Activity Barriers & Risk Stratification:  Activity Barriers & Cardiac Risk Stratification - 11/03/21 1131       Activity Barriers & Cardiac Risk Stratification   Activity Barriers Arthritis;Shortness of Breath;Muscular Weakness;Deconditioning;History of Falls;Balance Concerns;Neck/Spine Problems             6 Minute Walk:  6 Minute Walk     Row Name 11/03/21 1253         6 Minute Walk   Phase Initial     Distance 744 feet     Walk Time 6 minutes     # of Rest Breaks 0     MPH 1.41     METS 1.2     RPE 12     Perceived Dyspnea  2     VO2 Peak 4.19     Symptoms No     Resting HR 80 bpm     Resting BP 124/62     Resting Oxygen Saturation  99 %     Exercise Oxygen Saturation  during 6 min walk 94 %     Max Ex. HR 90 bpm     Max Ex. BP 140/64     2 Minute Post BP 120/64       Interval HR   1 Minute HR 80     2 Minute HR 79     3 Minute HR 83     4 Minute HR 90     5 Minute HR 89     6  Minute HR 89     2 Minute Post HR 74     Interval Heart Rate? Yes       Interval Oxygen   Interval Oxygen? Yes     Baseline Oxygen Saturation % 99 %     1 Minute Oxygen Saturation % 100 %     1 Minute Liters of Oxygen 2 L     2 Minute Oxygen Saturation % 99 %     2 Minute Liters of Oxygen 2 L     3 Minute Oxygen Saturation % 98 %     3 Minute Liters of Oxygen 2 L     4 Minute Oxygen Saturation % 97 %     4 Minute Liters of Oxygen 2 L     5 Minute Oxygen Saturation % 94 %     5 Minute Liters of Oxygen 2 L     6 Minute Oxygen Saturation % 96 %     6 Minute Liters of Oxygen 2 L     2 Minute Post Oxygen Saturation % 100 %      2 Minute Post Liters of Oxygen 2 L              Oxygen Initial Assessment:  Oxygen Initial Assessment - 11/03/21 1129       Home Oxygen   Home Oxygen Device Portable Concentrator;Home Concentrator    Sleep Oxygen Prescription Continuous    Liters per minute 2    Home Exercise Oxygen Prescription Continuous    Liters per minute 2    Home Resting Oxygen Prescription Continuous    Liters per minute 2    Compliance with Home Oxygen Use Yes      Initial 6 min Walk   Oxygen Used Continuous    Liters per minute 2      Program Oxygen Prescription   Program Oxygen Prescription Continuous    Liters per minute 2      Intervention   Short Term Goals To learn and exhibit compliance with exercise, home and travel O2 prescription;To learn and understand importance of monitoring SPO2 with pulse oximeter and demonstrate accurate use of the pulse oximeter.;To learn and understand importance of maintaining oxygen saturations>88%;To learn and demonstrate proper pursed lip breathing techniques or other breathing techniques. ;To learn and demonstrate proper use of respiratory medications    Long  Term Goals Exhibits compliance with exercise, home  and travel O2 prescription;Verbalizes importance of monitoring SPO2 with pulse oximeter and return demonstration;Maintenance of O2 saturations>88%;Exhibits proper breathing techniques, such as pursed lip breathing or other method taught during program session;Compliance with respiratory medication;Demonstrates proper use of MDI's             Oxygen Re-Evaluation:  Oxygen Re-Evaluation     Row Name 11/05/21 0905             Program Oxygen Prescription   Program Oxygen Prescription Continuous       Liters per minute 2       Comments Pt has not began to exercise in class yet         Home Oxygen   Home Oxygen Device Portable Concentrator;Home Concentrator       Sleep Oxygen Prescription Continuous       Liters per minute 2       Home  Exercise Oxygen Prescription Continuous       Liters per minute 2       Home Resting Oxygen Prescription Continuous  Liters per minute 2       Compliance with Home Oxygen Use Yes         Goals/Expected Outcomes   Short Term Goals To learn and exhibit compliance with exercise, home and travel O2 prescription;To learn and understand importance of monitoring SPO2 with pulse oximeter and demonstrate accurate use of the pulse oximeter.;To learn and understand importance of maintaining oxygen saturations>88%;To learn and demonstrate proper pursed lip breathing techniques or other breathing techniques. ;To learn and demonstrate proper use of respiratory medications       Long  Term Goals Exhibits compliance with exercise, home  and travel O2 prescription;Verbalizes importance of monitoring SPO2 with pulse oximeter and return demonstration;Maintenance of O2 saturations>88%;Exhibits proper breathing techniques, such as pursed lip breathing or other method taught during program session;Compliance with respiratory medication;Demonstrates proper use of MDI's       Goals/Expected Outcomes Compliance and understanding of oxygen saturation monitoring and breathing techniques to decreasae shortness of breath.                Oxygen Discharge (Final Oxygen Re-Evaluation):  Oxygen Re-Evaluation - 11/05/21 0905       Program Oxygen Prescription   Program Oxygen Prescription Continuous    Liters per minute 2    Comments Pt has not began to exercise in class yet      Home Oxygen   Home Oxygen Device Portable Concentrator;Home Concentrator    Sleep Oxygen Prescription Continuous    Liters per minute 2    Home Exercise Oxygen Prescription Continuous    Liters per minute 2    Home Resting Oxygen Prescription Continuous    Liters per minute 2    Compliance with Home Oxygen Use Yes      Goals/Expected Outcomes   Short Term Goals To learn and exhibit compliance with exercise, home and travel O2  prescription;To learn and understand importance of monitoring SPO2 with pulse oximeter and demonstrate accurate use of the pulse oximeter.;To learn and understand importance of maintaining oxygen saturations>88%;To learn and demonstrate proper pursed lip breathing techniques or other breathing techniques. ;To learn and demonstrate proper use of respiratory medications    Long  Term Goals Exhibits compliance with exercise, home  and travel O2 prescription;Verbalizes importance of monitoring SPO2 with pulse oximeter and return demonstration;Maintenance of O2 saturations>88%;Exhibits proper breathing techniques, such as pursed lip breathing or other method taught during program session;Compliance with respiratory medication;Demonstrates proper use of MDI's    Goals/Expected Outcomes Compliance and understanding of oxygen saturation monitoring and breathing techniques to decreasae shortness of breath.             Initial Exercise Prescription:  Initial Exercise Prescription - 11/03/21 1400       Date of Initial Exercise RX and Referring Provider   Date 11/03/21    Referring Provider Shearon Stalls    Expected Discharge Date 01/13/22      NuStep   Level 1    SPM 70    Minutes 15      Arm Ergometer   Level 1    Watts 1    RPM 60    Minutes 15      Prescription Details   Frequency (times per week) 2    Duration Progress to 30 minutes of continuous aerobic without signs/symptoms of physical distress      Intensity   THRR 40-80% of Max Heartrate 57-114    Ratings of Perceived Exertion 11-13    Perceived Dyspnea 0-4  Progression   Progression Continue to progress workloads to maintain intensity without signs/symptoms of physical distress.      Resistance Training   Training Prescription Yes    Weight red    Reps 10-15             Perform Capillary Blood Glucose checks as needed.  Exercise Prescription Changes:   Exercise Prescription Changes     Row Name 11/11/21 1400              Response to Exercise   Blood Pressure (Admit) 124/70       Blood Pressure (Exercise) 124/70       Blood Pressure (Exit) 108/60       Heart Rate (Admit) 66 bpm       Heart Rate (Exercise) 77 bpm       Heart Rate (Exit) 70 bpm       Oxygen Saturation (Admit) 98 %       Oxygen Saturation (Exercise) 98 %       Oxygen Saturation (Exit) 96 %       Rating of Perceived Exertion (Exercise) 11       Perceived Dyspnea (Exercise) 2       Duration Progress to 30 minutes of  aerobic without signs/symptoms of physical distress       Intensity THRR unchanged         Progression   Progression Continue to progress workloads to maintain intensity without signs/symptoms of physical distress.         Resistance Training   Training Prescription Yes       Weight red bands       Reps 10-15       Time 10 Minutes         Oxygen   Oxygen Continuous       Liters 2         NuStep   Level 1       SPM 70       Minutes 15       METs 1.6         Arm Ergometer   Level 1       Watts 1       RPM 38       Minutes 15         Oxygen   Maintain Oxygen Saturation 88% or higher                Exercise Comments:   Exercise Comments     Row Name 11/11/21 1454           Exercise Comments Pt completed first day of exercise. Douglas Hansen exercised for 15 min on the Nustep and arm ergometer. He averaged 1.6 METs at level 1 on the Nustep and 38 rpms at level 1 on the arm ergometer. He performed the warmup and cooldown standing with verbal cues. Discussed METs as pt showed understanding.                Exercise Goals and Review:   Exercise Goals     Row Name 11/03/21 1419 11/05/21 0903           Exercise Goals   Increase Physical Activity Yes Yes      Intervention Provide advice, education, support and counseling about physical activity/exercise needs.;Develop an individualized exercise prescription for aerobic and resistive training based on initial evaluation findings, risk  stratification, comorbidities and participant's personal goals. Provide advice, education, support and counseling  about physical activity/exercise needs.;Develop an individualized exercise prescription for aerobic and resistive training based on initial evaluation findings, risk stratification, comorbidities and participant's personal goals.      Expected Outcomes Short Term: Attend rehab on a regular basis to increase amount of physical activity.;Long Term: Add in home exercise to make exercise part of routine and to increase amount of physical activity.;Long Term: Exercising regularly at least 3-5 days a week. Short Term: Attend rehab on a regular basis to increase amount of physical activity.;Long Term: Add in home exercise to make exercise part of routine and to increase amount of physical activity.;Long Term: Exercising regularly at least 3-5 days a week.      Increase Strength and Stamina Yes Yes      Intervention Provide advice, education, support and counseling about physical activity/exercise needs.;Develop an individualized exercise prescription for aerobic and resistive training based on initial evaluation findings, risk stratification, comorbidities and participant's personal goals. Provide advice, education, support and counseling about physical activity/exercise needs.;Develop an individualized exercise prescription for aerobic and resistive training based on initial evaluation findings, risk stratification, comorbidities and participant's personal goals.      Expected Outcomes Short Term: Increase workloads from initial exercise prescription for resistance, speed, and METs.;Short Term: Perform resistance training exercises routinely during rehab and add in resistance training at home;Long Term: Improve cardiorespiratory fitness, muscular endurance and strength as measured by increased METs and functional capacity (6MWT) Short Term: Increase workloads from initial exercise prescription for  resistance, speed, and METs.;Short Term: Perform resistance training exercises routinely during rehab and add in resistance training at home;Long Term: Improve cardiorespiratory fitness, muscular endurance and strength as measured by increased METs and functional capacity (6MWT)      Able to understand and use rate of perceived exertion (RPE) scale Yes Yes      Intervention Provide education and explanation on how to use RPE scale Provide education and explanation on how to use RPE scale      Expected Outcomes Short Term: Able to use RPE daily in rehab to express subjective intensity level;Long Term:  Able to use RPE to guide intensity level when exercising independently Short Term: Able to use RPE daily in rehab to express subjective intensity level;Long Term:  Able to use RPE to guide intensity level when exercising independently      Able to understand and use Dyspnea scale Yes Yes      Intervention Provide education and explanation on how to use Dyspnea scale Provide education and explanation on how to use Dyspnea scale      Expected Outcomes Short Term: Able to use Dyspnea scale daily in rehab to express subjective sense of shortness of breath during exertion;Long Term: Able to use Dyspnea scale to guide intensity level when exercising independently Short Term: Able to use Dyspnea scale daily in rehab to express subjective sense of shortness of breath during exertion;Long Term: Able to use Dyspnea scale to guide intensity level when exercising independently      Able to check pulse independently Yes Yes      Intervention Provide education and demonstration on how to check pulse in carotid and radial arteries.;Review the importance of being able to check your own pulse for safety during independent exercise Provide education and demonstration on how to check pulse in carotid and radial arteries.;Review the importance of being able to check your own pulse for safety during independent exercise       Expected Outcomes Short Term: Able to explain why pulse  checking is important during independent exercise;Long Term: Able to check pulse independently and accurately Short Term: Able to explain why pulse checking is important during independent exercise;Long Term: Able to check pulse independently and accurately      Understanding of Exercise Prescription Yes Yes      Intervention Provide education, explanation, and written materials on patient's individual exercise prescription Provide education, explanation, and written materials on patient's individual exercise prescription      Expected Outcomes Short Term: Able to explain program exercise prescription;Long Term: Able to explain home exercise prescription to exercise independently Short Term: Able to explain program exercise prescription;Long Term: Able to explain home exercise prescription to exercise independently               Exercise Goals Re-Evaluation :  Exercise Goals Re-Evaluation     Row Name 11/05/21 0904             Exercise Goal Re-Evaluation   Exercise Goals Review Increase Physical Activity;Increase Strength and Stamina;Able to understand and use rate of perceived exertion (RPE) scale;Able to understand and use Dyspnea scale;Knowledge and understanding of Target Heart Rate Range (THRR);Understanding of Exercise Prescription       Comments Pt has not began exercise yet. Will monitor and progress as tolerated.       Expected Outcomes Through exercise at rehab and home, the patient will decrease shortness of breath with daily activities and feel confident in carrying out an exercise regimen at home.                Discharge Exercise Prescription (Final Exercise Prescription Changes):  Exercise Prescription Changes - 11/11/21 1400       Response to Exercise   Blood Pressure (Admit) 124/70    Blood Pressure (Exercise) 124/70    Blood Pressure (Exit) 108/60    Heart Rate (Admit) 66 bpm    Heart Rate (Exercise) 77  bpm    Heart Rate (Exit) 70 bpm    Oxygen Saturation (Admit) 98 %    Oxygen Saturation (Exercise) 98 %    Oxygen Saturation (Exit) 96 %    Rating of Perceived Exertion (Exercise) 11    Perceived Dyspnea (Exercise) 2    Duration Progress to 30 minutes of  aerobic without signs/symptoms of physical distress    Intensity THRR unchanged      Progression   Progression Continue to progress workloads to maintain intensity without signs/symptoms of physical distress.      Resistance Training   Training Prescription Yes    Weight red bands    Reps 10-15    Time 10 Minutes      Oxygen   Oxygen Continuous    Liters 2      NuStep   Level 1    SPM 70    Minutes 15    METs 1.6      Arm Ergometer   Level 1    Watts 1    RPM 38    Minutes 15      Oxygen   Maintain Oxygen Saturation 88% or higher             Nutrition:  Target Goals: Understanding of nutrition guidelines, daily intake of sodium '1500mg'$ , cholesterol '200mg'$ , calories 30% from fat and 7% or less from saturated fats, daily to have 5 or more servings of fruits and vegetables.  Biometrics:  Pre Biometrics - 11/03/21 1108       Pre Biometrics   Grip Strength 24 kg  Nutrition Therapy Plan and Nutrition Goals:  Nutrition Therapy & Goals - 11/11/21 1544       Nutrition Therapy   Diet Heart Healthy/Carbohydrate Consistent diet    Drug/Food Interactions Statins/Certain Fruits      Personal Nutrition Goals   Nutrition Goal Patient to build a healthy plate daily with a variety of fruits, vegetables, whole grains, lean protein/plant protein, and nonfat dairy as part of heart healthy lifestyle    Personal Goal #2 Patient to limit to '1500mg'$  of sodium daily    Personal Goal #3 Patient to identify and limit daily intake of saturated fat, trans fat, sodium, and refined carbohydrates      Intervention Plan   Intervention Prescribe, educate and counsel regarding individualized specific dietary  modifications aiming towards targeted core components such as weight, hypertension, lipid management, diabetes, heart failure and other comorbidities.    Expected Outcomes Short Term Goal: Understand basic principles of dietary content, such as calories, fat, sodium, cholesterol and nutrients.;Long Term Goal: Adherence to prescribed nutrition plan.             Nutrition Assessments:  MEDIFICTS Score Key: ?70 Need to make dietary changes  40-70 Heart Healthy Diet ? 40 Therapeutic Level Cholesterol Diet   Picture Your Plate Scores: <86 Unhealthy dietary pattern with much room for improvement. 41-50 Dietary pattern unlikely to meet recommendations for good health and room for improvement. 51-60 More healthful dietary pattern, with some room for improvement.  >60 Healthy dietary pattern, although there may be some specific behaviors that could be improved.    Nutrition Goals Re-Evaluation:  Nutrition Goals Re-Evaluation     Douglas Hansen Name 11/11/21 1544             Goals   Current Weight 199 lb 15.3 oz (90.7 kg)       Comment Most recent A1c was 6.7                Nutrition Goals Discharge (Final Nutrition Goals Re-Evaluation):  Nutrition Goals Re-Evaluation - 11/11/21 1544       Goals   Current Weight 199 lb 15.3 oz (90.7 kg)    Comment Most recent A1c was 6.7             Psychosocial: Target Goals: Acknowledge presence or absence of significant depression and/or stress, maximize coping skills, provide positive support system. Participant is able to verbalize types and ability to use techniques and skills needed for reducing stress and depression.  Initial Review & Psychosocial Screening:  Initial Psych Review & Screening - 11/03/21 1122       Initial Review   Current issues with History of Depression;Current Depression   Pt finished PTSD support group and is on medication for his depression.     Family Dynamics   Good Support System? Yes    Comments  children      Barriers   Psychosocial barriers to participate in program The patient should benefit from training in stress management and relaxation.      Screening Interventions   Interventions Encouraged to exercise    Expected Outcomes Short Term goal: Identification and review with participant of any Quality of Life or Depression concerns found by scoring the questionnaire.;Long Term goal: The participant improves quality of Life and PHQ9 Scores as seen by post scores and/or verbalization of changes             Quality of Life Scores:  Scores of 19 and below usually indicate a poorer quality  of life in these areas.  A difference of  2-3 points is a clinically meaningful difference.  A difference of 2-3 points in the total score of the Quality of Life Index has been associated with significant improvement in overall quality of life, self-image, physical symptoms, and general health in studies assessing change in quality of life.  PHQ-9: Review Flowsheet       11/03/2021  Depression screen PHQ 2/9  Decreased Interest 0  Down, Depressed, Hopeless 1  PHQ - 2 Score 1  Altered sleeping 2  Tired, decreased energy 1  Change in appetite 0  Feeling bad or failure about yourself  0  Trouble concentrating 0  Moving slowly or fidgety/restless 0  Suicidal thoughts 0  PHQ-9 Score 4  Difficult doing work/chores Somewhat difficult   Interpretation of Total Score  Total Score Depression Severity:  1-4 = Minimal depression, 5-9 = Mild depression, 10-14 = Moderate depression, 15-19 = Moderately severe depression, 20-27 = Severe depression   Psychosocial Evaluation and Intervention:  Psychosocial Evaluation - 11/03/21 1125       Psychosocial Evaluation & Interventions   Interventions Stress management education;Relaxation education;Encouraged to exercise with the program and follow exercise prescription    Expected Outcomes For Douglas Hansen to participate in Pulmonary Rehab    Continue  Psychosocial Services  Follow up required by staff             Psychosocial Re-Evaluation:  Psychosocial Re-Evaluation     Douglas Hansen Name 11/11/21 0906             Psychosocial Re-Evaluation   Current issues with History of Depression;Current Depression       Comments No change in psychosocial assessment since orientation 11/03/2021.  He is controlled with current therapy and medications, finished PTSD support group.  He will begin exercising today, 11/11/2021 in pulmonary rehab.       Expected Outcomes For Douglas Hansen to continue to be without barriers or psychosocial concerns while participating in pulmonary rehab. Will follow every 30 days for psychosocial assessment/changes.       Interventions Stress management education;Encouraged to attend Pulmonary Rehabilitation for the exercise;Relaxation education       Continue Psychosocial Services  Follow up required by staff                Psychosocial Discharge (Final Psychosocial Re-Evaluation):  Psychosocial Re-Evaluation - 11/11/21 0906       Psychosocial Re-Evaluation   Current issues with History of Depression;Current Depression    Comments No change in psychosocial assessment since orientation 11/03/2021.  He is controlled with current therapy and medications, finished PTSD support group.  He will begin exercising today, 11/11/2021 in pulmonary rehab.    Expected Outcomes For Douglas Hansen to continue to be without barriers or psychosocial concerns while participating in pulmonary rehab. Will follow every 30 days for psychosocial assessment/changes.    Interventions Stress management education;Encouraged to attend Pulmonary Rehabilitation for the exercise;Relaxation education    Continue Psychosocial Services  Follow up required by staff             Education: Education Goals: Education classes will be provided on a weekly basis, covering required topics. Participant will state understanding/return demonstration of topics  presented.  Learning Barriers/Preferences:  Learning Barriers/Preferences - 11/03/21 1125       Learning Barriers/Preferences   Learning Barriers Sight   wears glasses   Learning Preferences Pictoral;Skilled Demonstration;Written Material             Education Topics:  Introduction to Pulmonary Rehab Group instruction provided by PowerPoint, verbal discussion, and written material to support subject matter. Instructor reviews what Pulmonary Rehab is, the purpose of the program, and how patients are referred.     Know Your Numbers Group instruction that is supported by a PowerPoint presentation. Instructor discusses importance of knowing and understanding resting, exercise, and post-exercise oxygen saturation, heart rate, and blood pressure. Oxygen saturation, heart rate, blood pressure, rating of perceived exertion, and dyspnea are reviewed along with a normal range for these values.    Exercise for the Pulmonary Patient Group instruction that is supported by a PowerPoint presentation. Instructor discusses benefits of exercise, core components of exercise, frequency, duration, and intensity of an exercise routine, importance of utilizing pulse oximetry during exercise, safety while exercising, and options of places to exercise outside of rehab.       MET Level  Group instruction provided by PowerPoint, verbal discussion, and written material to support subject matter. Instructor reviews what METs are and how to increase METs.    Pulmonary Medications Verbally interactive group education provided by instructor with focus on inhaled medications and proper administration.   Anatomy and Physiology of the Respiratory System Group instruction provided by PowerPoint, verbal discussion, and written material to support subject matter. Instructor reviews respiratory cycle and anatomical components of the respiratory system and their functions. Instructor also reviews differences in  obstructive and restrictive respiratory diseases with examples of each.    Oxygen Safety Group instruction provided by PowerPoint, verbal discussion, and written material to support subject matter. There is an overview of "What is Oxygen" and "Why do we need it".  Instructor also reviews how to create a safe environment for oxygen use, the importance of using oxygen as prescribed, and the risks of noncompliance. There is a brief discussion on traveling with oxygen and resources the patient may utilize.   Oxygen Use Group instruction provided by PowerPoint, verbal discussion, and written material to discuss how supplemental oxygen is prescribed and different types of oxygen supply systems. Resources for more information are provided.    Breathing Techniques Group instruction that is supported by demonstration and informational handouts. Instructor discusses the benefits of pursed lip and diaphragmatic breathing and detailed demonstration on how to perform both.     Risk Factor Reduction Group instruction that is supported by a PowerPoint presentation. Instructor discusses the definition of a risk factor, different risk factors for pulmonary disease, and how the heart and lungs work together.   MD Day A group question and answer session with a medical doctor that allows participants to ask questions that relate to their pulmonary disease state.   Nutrition for the Pulmonary Patient Group instruction provided by PowerPoint slides, verbal discussion, and written materials to support subject matter. The instructor gives an explanation and review of healthy diet recommendations, which includes a discussion on weight management, recommendations for fruit and vegetable consumption, as well as protein, fluid, caffeine, fiber, sodium, sugar, and alcohol. Tips for eating when patients are short of breath are discussed.    Other Education Group or individual verbal, written, or video instructions  that support the educational goals of the pulmonary rehab program.    Knowledge Questionnaire Score:  Knowledge Questionnaire Score - 11/03/21 1142       Knowledge Questionnaire Score   Pre Score 15/18             Core Components/Risk Factors/Patient Goals at Admission:  Personal Goals and Risk Factors at Admission - 11/03/21  1125       Core Components/Risk Factors/Patient Goals on Admission    Weight Management Weight Loss;Yes    Intervention Weight Management: Develop a combined nutrition and exercise program designed to reach desired caloric intake, while maintaining appropriate intake of nutrient and fiber, sodium and fats, and appropriate energy expenditure required for the weight goal.;Weight Management: Provide education and appropriate resources to help participant work on and attain dietary goals.;Weight Management/Obesity: Establish reasonable short term and long term weight goals.;Obesity: Provide education and appropriate resources to help participant work on and attain dietary goals.    Expected Outcomes Short Term: Continue to assess and modify interventions until short term weight is achieved;Long Term: Adherence to nutrition and physical activity/exercise program aimed toward attainment of established weight goal;Weight Maintenance: Understanding of the daily nutrition guidelines, which includes 25-35% calories from fat, 7% or less cal from saturated fats, less than $RemoveB'200mg'fAgospOQ$  cholesterol, less than 1.5gm of sodium, & 5 or more servings of fruits and vegetables daily;Weight Loss: Understanding of general recommendations for a balanced deficit meal plan, which promotes 1-2 lb weight loss per week and includes a negative energy balance of [phone] kcal/d;Understanding recommendations for meals to include 15-35% energy as protein, 25-35% energy from fat, 35-60% energy from carbohydrates, less than $RemoveB'200mg'lZfahccr$  of dietary cholesterol, 20-35 gm of total fiber daily;Understanding of distribution  of calorie intake throughout the day with the consumption of 4-5 meals/snacks    Improve shortness of breath with ADL's Yes    Intervention Provide education, individualized exercise plan and daily activity instruction to help decrease symptoms of SOB with activities of daily living.    Expected Outcomes Short Term: Improve cardiorespiratory fitness to achieve a reduction of symptoms when performing ADLs;Long Term: Be able to perform more ADLs without symptoms or delay the onset of symptoms             Core Components/Risk Factors/Patient Goals Review:   Goals and Risk Factor Review     Row Name 11/11/21 0912             Core Components/Risk Factors/Patient Goals Review   Personal Goals Review Weight Management/Obesity;Increase knowledge of respiratory medications and ability to use respiratory devices properly.;Improve shortness of breath with ADL's;Develop more efficient breathing techniques such as purse lipped breathing and diaphragmatic breathing and practicing self-pacing with activity.       Review Lionell will begin exercising in pulmonary rehab today, 11/11/2021.  At this time no goals have been met.       Expected Outcomes See admission goals.                Core Components/Risk Factors/Patient Goals at Discharge (Final Review):   Goals and Risk Factor Review - 11/11/21 0912       Core Components/Risk Factors/Patient Goals Review   Personal Goals Review Weight Management/Obesity;Increase knowledge of respiratory medications and ability to use respiratory devices properly.;Improve shortness of breath with ADL's;Develop more efficient breathing techniques such as purse lipped breathing and diaphragmatic breathing and practicing self-pacing with activity.    Review Lindsay will begin exercising in pulmonary rehab today, 11/11/2021.  At this time no goals have been met.    Expected Outcomes See admission goals.             ITP Comments: Pt is making expected progress  toward Pulmonary Rehab goals after completing 1 sessions. Recommend continued exercise, life style modification, education, and utilization of breathing techniques to increase stamina and strength, while also decreasing shortness of  breath with exertion.  Dr. Rodman Pickle is Medical Director for Pulmonary Rehab at [facility].

## 2021-11-13 ENCOUNTER — Encounter (HOSPITAL_COMMUNITY)
Admission: RE | Admit: 2021-11-13 | Discharge: 2021-11-13 | Disposition: A | Discharge status: Home / Self Care | Payer: Medicare Other | Source: Ambulatory Visit | Attending: Internal Medicine | Admitting: Internal Medicine

## 2021-11-13 DIAGNOSIS — J449 Chronic obstructive pulmonary disease, unspecified: Secondary | ICD-10-CM | POA: Diagnosis not present

## 2021-11-13 LAB — GLUCOSE, CAPILLARY
Glucose-Capillary: 117 mg/dL — ABNORMAL HIGH (ref 70–99)
Glucose-Capillary: 100 mg/dL — ABNORMAL HIGH (ref 70–99)

## 2021-11-13 NOTE — Progress Notes (Signed)
Daily Session Note  Patient Details  Name: Douglas Hansen MRN: [id number] Date of Birth: 10/14/1945 Referring Provider:   April Manson Pulmonary Rehab Walk Test from 11/03/2021 in [place]  Referring Provider Shearon Stalls       Encounter Date: 11/13/2021  Check In:  Session Check In - 11/13/21 1526       Check-In   Supervising physician immediately available to respond to emergencies [facility] immediately available    Physician(s) Dr. Cyndia Skeeters    Location [facility]    Staff Present Rosebud Poles, RN, Quentin Ore, MS, ACSM-CEP, Exercise Physiologist;Randi Yevonne Pax, ACSM-CEP, Exercise Physiologist;Lisa Ysidro Evert, RN    Virtual Visit No    Medication changes reported     No    Fall or balance concerns reported    No    Tobacco Cessation No Change    Warm-up and Cool-down Performed as group-led instruction    Resistance Training Performed Yes    VAD Patient? No    PAD/SET Patient? No      Pain Assessment   Currently in Pain? No/denies    Multiple Pain Sites No             Capillary Blood Glucose: Results for orders placed or performed during the hospital encounter of 11/13/21 (from the past 24 hour(s))  Glucose, capillary     Status: Abnormal   Collection Time: 11/13/21  2:17 PM  Result Value Ref Range   Glucose-Capillary 100 (H) 70 - 99 mg/dL      Social History   Tobacco Use  Smoking Status Former   Packs/day: 1.00   Years: 20.00   Total pack years: 20.00   Types: Cigarettes   Quit date: 03/09/1985   Years since quitting: 36.7  Smokeless Tobacco Never    Goals Met:  Proper associated with RPD/PD & O2 Sat Exercise tolerated well No report of concerns or symptoms today Strength training completed today  Goals Unmet:  Not Applicable  Comments: Service time is from 1320 to [place]     Dr. Rodman Pickle is Medical Director for Pulmonary Rehab at [facility].

## 2021-11-18 ENCOUNTER — Encounter (HOSPITAL_COMMUNITY)
Admission: RE | Admit: 2021-11-18 | Discharge: 2021-11-18 | Disposition: A | Discharge status: Home / Self Care | Payer: Medicare Other | Source: Ambulatory Visit | Attending: Internal Medicine | Admitting: Internal Medicine

## 2021-11-18 DIAGNOSIS — J449 Chronic obstructive pulmonary disease, unspecified: Secondary | ICD-10-CM | POA: Diagnosis not present

## 2021-11-18 NOTE — Progress Notes (Signed)
Daily Session Note  Patient Details  Name: Douglas Hansen MRN: [id number] Date of Birth: Oct 31, 1945 Referring Provider:   April Manson Pulmonary Rehab Walk Test from 11/03/2021 in [place]  Referring Provider Shearon Stalls       Encounter Date: 11/18/2021  Check In:  Session Check In - 11/18/21 1512       Check-In   Supervising physician immediately available to respond to emergencies [facility] immediately available    Physician(s) Dr. Erskine Emery    [facility]    Staff Present Rosebud Poles, RN, Quentin Ore, MS, ACSM-CEP, Exercise Physiologist;Randi Yevonne Pax, ACSM-CEP, Exercise Physiologist;Sarea Fyfe Starleen Blue, MS, Exercise Physiologist    Virtual Visit No    Medication changes reported     No    Fall or balance concerns reported    No    Tobacco Cessation No Change    Warm-up and Cool-down Performed as group-led instruction    Resistance Training Performed Yes    VAD Patient? No    PAD/SET Patient? No      Pain Assessment   Currently in Pain? No/denies    Multiple Pain Sites No             Capillary Blood Glucose: No results found for this or any previous visit (from the past 24 hour(s)).    Social History   Tobacco Use  Smoking Status Former   Packs/day: 1.00   Years: 20.00   Total pack years: 20.00   Types: Cigarettes   Quit date: 03/09/1985   Years since quitting: 36.7  Smokeless Tobacco Never    Goals Met:  Proper associated with RPD/PD & O2 Sat Exercise tolerated well No report of concerns or symptoms today Strength training completed today  Goals Unmet:  Not Applicable  Comments: Service time is from 1319 to 1443.    Dr. Rodman Pickle is Medical Director for Pulmonary Rehab at [facility].

## 2021-11-20 ENCOUNTER — Encounter (HOSPITAL_COMMUNITY)
Admission: RE | Admit: 2021-11-20 | Discharge: 2021-11-20 | Disposition: A | Discharge status: Home / Self Care | Payer: Medicare Other | Source: Ambulatory Visit | Attending: Internal Medicine | Admitting: Internal Medicine

## 2021-11-20 DIAGNOSIS — J449 Chronic obstructive pulmonary disease, unspecified: Secondary | ICD-10-CM | POA: Diagnosis not present

## 2021-11-20 NOTE — Progress Notes (Signed)
Daily Session Note  Patient Details  Name: Douglas Hansen MRN: [id number] Date of Birth: [birth year] Referring Provider:   April Manson Pulmonary Rehab Walk Test from 11/03/2021 in [place]  Referring Provider Shearon Stalls       Encounter Date: 11/20/2021  Check In:  Session Check In - 11/20/21 1422       Check-In   Supervising physician immediately available to respond to emergencies [facility] immediately available    Physician(s) Dr. Erskine Emery    [facility]    Staff Present Elmon Else, MS, ACSM-CEP, Exercise Physiologist;Randi Yevonne Pax, ACSM-CEP, Exercise Physiologist;Lisa Assunta Curtis, MS, Exercise Physiologist    Virtual Visit No    Medication changes reported     No    Fall or balance concerns reported    No    Tobacco Cessation No Change    Warm-up and Cool-down Performed as group-led instruction    Resistance Training Performed Yes    VAD Patient? No    PAD/SET Patient? No      Pain Assessment   Currently in Pain? No/denies    Pain Score 0-No pain    Multiple Pain Sites No             Capillary Blood Glucose: No results found for this or any previous visit (from the past 24 hour(s)).    Social History   Tobacco Use  Smoking Status Former   Packs/day: 1.00   Years: 20.00   Total pack years: 20.00   Types: Cigarettes   Quit date: 03/09/1985   Years since quitting: 36.7  Smokeless Tobacco Never    Goals Met:  Proper associated with RPD/PD & O2 Sat Exercise tolerated well No report of concerns or symptoms today Strength training completed today  Goals Unmet:  Not Applicable  Comments: Service time is from 1320 to 1442.    Dr. Rodman Pickle is Medical Director for Pulmonary Rehab at [facility].

## 2021-11-25 ENCOUNTER — Encounter (HOSPITAL_COMMUNITY): Payer: Medicare Other

## 2021-11-25 ENCOUNTER — Telehealth (HOSPITAL_COMMUNITY): Payer: Self-pay | Admitting: Family Medicine

## 2021-11-27 ENCOUNTER — Encounter (HOSPITAL_COMMUNITY): Payer: Self-pay

## 2021-11-27 ENCOUNTER — Encounter (HOSPITAL_COMMUNITY)
Admission: RE | Admit: 2021-11-27 | Discharge: 2021-11-27 | Disposition: A | Discharge status: Home / Self Care | Payer: Medicare Other | Source: Ambulatory Visit | Attending: Internal Medicine | Admitting: Internal Medicine

## 2021-11-27 DIAGNOSIS — J449 Chronic obstructive pulmonary disease, unspecified: Secondary | ICD-10-CM | POA: Diagnosis not present

## 2021-11-27 NOTE — Progress Notes (Signed)
Daily Session Note  Patient Details  Name: Douglas Hansen MRN: [id number] Date of Birth: 05-Apr-1945 Referring Provider:   April Manson Pulmonary Rehab Walk Test from 11/03/2021 in [place]  Referring Provider Shearon Stalls       Encounter Date: 11/27/2021  Check In:  Session Check In - 11/27/21 1426       Check-In   Physician(s) Erskine Emery    [facility]    Staff Present Elmon Else, MS, ACSM-CEP, Exercise Physiologist;Emarion Toral Yevonne Pax, ACSM-CEP, Exercise Physiologist;Bailey Pearline Cables, MS, Exercise Physiologist;Jetta Gilford Rile BS, ACSM-CEP, Exercise Physiologist;Carlette Wilber Oliphant, RN, BSN;Samantha [place], RD, LDN    Virtual Visit No    Medication changes reported     No    Fall or balance concerns reported    No    Tobacco Cessation No Change    Warm-up and Cool-down Performed as group-led instruction    Resistance Training Performed Yes    VAD Patient? No    PAD/SET Patient? No      Pain Assessment   Currently in Pain? No/denies    Pain Score 0-No pain    Multiple Pain Sites No             Capillary Blood Glucose: No results found for this or any previous visit (from the past 24 hour(s)).    Social History   Tobacco Use  Smoking Status Former   Packs/day: 1.00   Years: 20.00   Total pack years: 20.00   Types: Cigarettes   Quit date: 03/09/1985   Years since quitting: 36.7  Smokeless Tobacco Never    Goals Met:  Independence with exercise equipment Exercise tolerated well No report of concerns or symptoms today Strength training completed today  Goals Unmet:  Not Applicable  Comments: Service time is from 1308 to 1450.    Dr. Rodman Pickle is Medical Director for Pulmonary Rehab at [facility].

## 2021-12-02 ENCOUNTER — Encounter (HOSPITAL_COMMUNITY)
Admission: RE | Admit: 2021-12-02 | Discharge: 2021-12-02 | Disposition: A | Discharge status: Home / Self Care | Payer: Medicare Other | Source: Ambulatory Visit | Attending: Internal Medicine | Admitting: Internal Medicine

## 2021-12-02 DIAGNOSIS — J449 Chronic obstructive pulmonary disease, unspecified: Secondary | ICD-10-CM

## 2021-12-02 NOTE — Progress Notes (Signed)
Daily Session Note  Patient Details  Name: Douglas Hansen MRN: [id number] Date of Birth: 03-04-1946 Referring Provider:   April Manson Pulmonary Rehab Walk Test from 11/03/2021 in [place]  Referring Provider Shearon Stalls       Encounter Date: 12/02/2021  Check In:  Session Check In - 12/02/21 1523       Check-In   Supervising physician immediately available to respond to emergencies [facility] - Physician supervision    Physician(s) Dr. Timoteo Gaul    [facility]    Staff Present Elmon Else, MS, ACSM-CEP, Exercise Physiologist;Randi Yevonne Pax, ACSM-CEP, Exercise Physiologist;Carlette Wilber Oliphant, RN, BSN;Lisa Ysidro Evert, Felipe Drone, RN, MHA    Virtual Visit No    Medication changes reported     No    Fall or balance concerns reported    No    Tobacco Cessation No Change    Warm-up and Cool-down Performed as group-led instruction    Resistance Training Performed Yes    VAD Patient? No    PAD/SET Patient? No      Pain Assessment   Currently in Pain? No/denies    Multiple Pain Sites No             Capillary Blood Glucose: No results found for this or any previous visit (from the past 24 hour(s)).    Social History   Tobacco Use  Smoking Status Former   Packs/day: 1.00   Years: 20.00   Total pack years: 20.00   Types: Cigarettes   Quit date: 03/09/1985   Years since quitting: 36.7  Smokeless Tobacco Never    Goals Met:  Proper associated with RPD/PD & O2 Sat Exercise tolerated well No report of concerns or symptoms today Strength training completed today  Goals Unmet:  Not Applicable  Comments: Service time is from 1336 to 1450.    Dr. Rodman Pickle is Medical Director for Pulmonary Rehab at [facility].

## 2021-12-03 NOTE — Progress Notes (Signed)
Pulmonary Individual Treatment Plan  Patient Details  Name: Douglas Hansen MRN: [id number] Date of Birth: 1945-10-28 Referring Provider:   April Manson Pulmonary Rehab Walk Test from 11/03/2021 in [place]  Referring Provider Shearon Stalls       Initial Encounter Date:  Flowsheet Row Pulmonary Rehab Walk Test from 11/03/2021 in [place]  Date 11/03/21       Visit Diagnosis: Stage 3 severe COPD by GOLD classification ([place])  Patient's Home Medications on Admission:   Current Outpatient Medications:    albuterol (PROVENTIL) (2.5 MG/3ML) 0.083% nebulizer solution, Take 3 mLs (2.5 mg total) by nebulization every 4 (four) hours as needed for wheezing or shortness of breath (coughing fits)., Disp: 75 mL, Rfl: 1   albuterol (VENTOLIN HFA) 108 (90 Base) MCG/ACT inhaler, INHALE 2 PUFFS BY MOUTH EVERY 4 HOURS AS NEEDED FOR WHEEZING OR SHORTNESS OF BREATH (Patient taking differently: Inhale 2 puffs into the lungs every 4 (four) hours as needed for wheezing or shortness of breath.), Disp: 42.5 g, Rfl: 0   amLODipine (NORVASC) 10 MG tablet, Take 10 mg by mouth daily., Disp: , Rfl:    ascorbic acid (VITAMIN C) 500 MG tablet, Take 1,000 mg by mouth daily. , Disp: , Rfl:    aspirin EC 81 MG EC tablet, Take 1 tablet (81 mg total) by mouth daily., Disp: 90 tablet, Rfl: 3   azelastine (ASTELIN) 0.1 % nasal spray, Place 1-2 sprays into both nostrils 2 (two) times daily as needed (nasal drainage). Use in each nostril as directed (Patient taking differently: Place 1 spray into both nostrils 2 (two) times daily as needed for allergies or rhinitis (nasal drainage).), Disp: 30 mL, Rfl: 5   Budeson-Glycopyrrol-Formoterol (BREZTRI AEROSPHERE) 160-9-4.8 MCG/ACT AERO, Inhale 2 puffs into the lungs in the morning and at bedtime. with spacer and rinse mouth afterwards., Disp: 10.7 g, Rfl: 5   budesonide-formoterol (SYMBICORT) 80-4.5 MCG/ACT inhaler, Inhale 2  puffs into the lungs 2 (two) times daily., Disp: , Rfl:    calcipotriene (DOVONOX) 0.005 % cream, Apply 1 Application topically daily as needed (feet)., Disp: , Rfl:    carvedilol (COREG) 12.5 MG tablet, Take 6.25 mg by mouth 2 (two) times daily with a meal. , Disp: , Rfl:    Cellulose Carmellose Sodium POWD, Apply 1 application topically daily as needed (irritation). Apply to feet, Disp: , Rfl:    Cholecalciferol (VITAMIN D-3 PO), Take 1 capsule by mouth daily., Disp: , Rfl:    clobetasol ointment (TEMOVATE) 8.56 %, Apply 1 application  topically 2 (two) times daily as needed (rash)., Disp: , Rfl:    cyclobenzaprine (FLEXERIL) 5 MG tablet, Take 10 mg by mouth at bedtime as needed for muscle spasms., Disp: , Rfl:    doxazosin (CARDURA) 1 MG tablet, Take 1 mg by mouth daily., Disp: , Rfl:    EPINEPHrine 0.3 mg/0.3 mL IJ SOAJ injection, Inject 0.3 mLs (0.3 mg total) into the muscle as needed for anaphylaxis., Disp: 2 each, Rfl: 1   EYLEA 2 MG/0.05ML SOLN, Place into the left eye every 30 (thirty) days. INJECTION, Disp: , Rfl:    Fluocinolone Acetonide Body 0.01 % OIL, Apply 1 application  topically daily as needed (dry skin)., Disp: , Rfl:    fluticasone (FLONASE) 50 MCG/ACT nasal spray, Place 1 spray into both nostrils 2 (two) times daily as needed (nasal congestion)., Disp: 16 g, Rfl: 5   furosemide (LASIX) 20 MG tablet, Take 20 mg by  mouth daily., Disp: , Rfl:    ketoconazole (NIZORAL) 2 % shampoo, Apply 1 application  topically every Monday, Wednesday, and Friday., Disp: , Rfl:    metFORMIN (GLUCOPHAGE) 500 MG tablet, Take 250 mg by mouth 2 (two) times daily with a meal., Disp: , Rfl:    methocarbamol (ROBAXIN) 500 MG tablet, Take 1.5 tablets (750 mg total) by mouth every 6 (six) hours as needed for up to 30 doses for muscle spasms (Neck Stiffness)., Disp: 30 tablet, Rfl: 0   montelukast (SINGULAIR) 10 MG tablet, Take 1 tablet (10 mg total) by mouth at bedtime., Disp: 30 tablet, Rfl: 5   Multiple  Vitamins-Minerals (MULTIVITAMIN & MINERAL PO), Take 1 tablet by mouth daily. Reported on 06/17/2015, Disp: , Rfl:    omeprazole (PRILOSEC) 20 MG capsule, Take 20 mg by mouth daily., Disp: , Rfl:    OneTouch Delica Lancets 02V MISC, daily., Disp: , Rfl:    ONETOUCH ULTRA test strip, as needed. , Disp: , Rfl:    OXYGEN, Inhale 2 L into the lungs continuous., Disp: , Rfl:    potassium chloride (KLOR-CON) 10 MEQ tablet, Take 2 tablets (20 mEq total) by mouth daily for 7 days., Disp: 14 tablet, Rfl: 0   Respiratory Therapy Supplies (FLUTTER) DEVI, Use as directed, Disp: 1 each, Rfl: 0   rosuvastatin (CRESTOR) 20 MG tablet, Take 10 mg by mouth at bedtime., Disp: , Rfl:    sertraline (ZOLOFT) 100 MG tablet, Take 100 mg by mouth daily., Disp: , Rfl:    simethicone (MYLICON) 80 MG chewable tablet, Chew 80 mg by mouth daily., Disp: , Rfl:   Current Facility-Administered Medications:    Benralizumab SOSY 30 mg, 30 mg, Subcutaneous, Q8 Weeks, Kim, Yoon M, DO, 30 mg at 12/31/20 1024   tezepelumab-ekko (TEZSPIRE) 210 MG/1.91ML syringe 210 mg, 210 mg, Subcutaneous, Q28 days, Garnet Sierras, DO, 210 mg at 08/05/21 1345  Past Medical History: Past Medical History:  Diagnosis Date   Agent orange exposure    Arthritis    Asthma    Asthma with acute exacerbation 06/17/2015   COPD (chronic obstructive pulmonary disease) ([place])    Diabetes mellitus    Hyperlipemia    Hypertension    OSA on CPAP    Pneumonia    Pneumonia due to COVID-19 virus 11/22/2019   PTSD (post-traumatic stress disorder)     Tobacco Use: Social History   Tobacco Use  Smoking Status Former   Packs/day: 1.00   Years: 20.00   Total pack years: 20.00   Types: Cigarettes   Quit date: 03/09/1985   Years since quitting: 36.7  Smokeless Tobacco Never    Labs: Review Flowsheet       Latest Ref Rng & Units 02/06/2012 11/22/2019 01/02/2020  Labs for ITP Cardiac and Pulmonary Rehab  Trlycerides <150 mg/dL - 223  -  Hemoglobin A1c 4.8 -  5.6 % - - 6.7   TCO2 0 - 100 mmol/L 29  - -    Capillary Blood Glucose: Lab Results  Component Value Date   GLUCAP 100 (H) 11/13/2021   GLUCAP 117 (H) 11/13/2021   GLUCAP 110 (H) 11/11/2021   GLUCAP 127 (H) 11/11/2021   GLUCAP 127 (H) 11/03/2021     Pulmonary Assessment Scores:  Pulmonary Assessment Scores     Row Name 11/03/21 1130         ADL UCSD   ADL Phase Entry     SOB Score total 24  CAT Score   CAT Score 20       mMRC Score   mMRC Score 4             UCSD: Self-administered rating of dyspnea associated with activities of daily living (ADLs) 6-point scale (0 = "not at all" to 5 = "maximal or unable to do because of breathlessness")  Scoring Scores range from 0 to 120.  Minimally important difference is 5 units  CAT: CAT can identify the health impairment of COPD patients and is better correlated with disease progression.  CAT has a scoring range of zero to 40. The CAT score is classified into four groups of low (less than 10), medium (10 - 20), high (21-30) and very high (31-40) based on the impact level of disease on health status. A CAT score over 10 suggests significant symptoms.  A worsening CAT score could be explained by an exacerbation, poor medication adherence, poor inhaler technique, or progression of COPD or comorbid conditions.  CAT MCID is 2 points  mMRC: mMRC (Modified Medical Research Council) Dyspnea Scale is used to assess the degree of baseline functional disability in patients of respiratory disease due to dyspnea. No minimal important difference is established. A decrease in score of 1 point or greater is considered a positive change.   Pulmonary Function Assessment:  Pulmonary Function Assessment - 11/03/21 1129       Breath   Bilateral Breath Sounds Clear    Shortness of Breath Yes;Limiting activity             Exercise Target Goals: Exercise Program Goal: Individual exercise prescription set using results from  initial 6 min walk test and THRR while considering  patient's activity barriers and safety.   Exercise Prescription Goal: Initial exercise prescription builds to 30-45 minutes a day of aerobic activity, 2-3 days per week.  Home exercise guidelines will be given to patient during program as part of exercise prescription that the participant will acknowledge.  Activity Barriers & Risk Stratification:  Activity Barriers & Cardiac Risk Stratification - 11/03/21 1131       Activity Barriers & Cardiac Risk Stratification   Activity Barriers Arthritis;Shortness of Breath;Muscular Weakness;Deconditioning;History of Falls;Balance Concerns;Neck/Spine Problems             6 Minute Walk:  6 Minute Walk     Row Name 11/03/21 1253         6 Minute Walk   Phase Initial     Distance 744 feet     Walk Time 6 minutes     # of Rest Breaks 0     MPH 1.41     METS 1.2     RPE 12     Perceived Dyspnea  2     VO2 Peak 4.19     Symptoms No     Resting HR 80 bpm     Resting BP 124/62     Resting Oxygen Saturation  99 %     Exercise Oxygen Saturation  during 6 min walk 94 %     Max Ex. HR 90 bpm     Max Ex. BP 140/64     2 Minute Post BP 120/64       Interval HR   1 Minute HR 80     2 Minute HR 79     3 Minute HR 83     4 Minute HR 90     5 Minute HR 89     6  Minute HR 89     2 Minute Post HR 74     Interval Heart Rate? Yes       Interval Oxygen   Interval Oxygen? Yes     Baseline Oxygen Saturation % 99 %     1 Minute Oxygen Saturation % 100 %     1 Minute Liters of Oxygen 2 L     2 Minute Oxygen Saturation % 99 %     2 Minute Liters of Oxygen 2 L     3 Minute Oxygen Saturation % 98 %     3 Minute Liters of Oxygen 2 L     4 Minute Oxygen Saturation % 97 %     4 Minute Liters of Oxygen 2 L     5 Minute Oxygen Saturation % 94 %     5 Minute Liters of Oxygen 2 L     6 Minute Oxygen Saturation % 96 %     6 Minute Liters of Oxygen 2 L     2 Minute Post Oxygen Saturation % 100 %      2 Minute Post Liters of Oxygen 2 L              Oxygen Initial Assessment:  Oxygen Initial Assessment - 11/03/21 1129       Home Oxygen   Home Oxygen Device Portable Concentrator;Home Concentrator    Sleep Oxygen Prescription Continuous    Liters per minute 2    Home Exercise Oxygen Prescription Continuous    Liters per minute 2    Home Resting Oxygen Prescription Continuous    Liters per minute 2    Compliance with Home Oxygen Use Yes      Initial 6 min Walk   Oxygen Used Continuous    Liters per minute 2      Program Oxygen Prescription   Program Oxygen Prescription Continuous    Liters per minute 2      Intervention   Short Term Goals To learn and exhibit compliance with exercise, home and travel O2 prescription;To learn and understand importance of monitoring SPO2 with pulse oximeter and demonstrate accurate use of the pulse oximeter.;To learn and understand importance of maintaining oxygen saturations>88%;To learn and demonstrate proper pursed lip breathing techniques or other breathing techniques. ;To learn and demonstrate proper use of respiratory medications    Long  Term Goals Exhibits compliance with exercise, home  and travel O2 prescription;Verbalizes importance of monitoring SPO2 with pulse oximeter and return demonstration;Maintenance of O2 saturations>88%;Exhibits proper breathing techniques, such as pursed lip breathing or other method taught during program session;Compliance with respiratory medication;Demonstrates proper use of MDI's             Oxygen Re-Evaluation:  Oxygen Re-Evaluation     Row Name 11/05/21 0905 12/01/21 1427           Program Oxygen Prescription   Program Oxygen Prescription Continuous Continuous      Liters per minute 2 2      Comments Pt has not began to exercise in class yet --        Home Oxygen   Home Oxygen Device Portable Concentrator;Home Concentrator Portable Concentrator;Home Concentrator      Sleep Oxygen  Prescription Continuous Continuous      Liters per minute 2 2      Home Exercise Oxygen Prescription Continuous Continuous      Liters per minute 2 2      Home Resting Oxygen Prescription  Continuous Continuous      Liters per minute 2 2      Compliance with Home Oxygen Use Yes Yes        Goals/Expected Outcomes   Short Term Goals To learn and exhibit compliance with exercise, home and travel O2 prescription;To learn and understand importance of monitoring SPO2 with pulse oximeter and demonstrate accurate use of the pulse oximeter.;To learn and understand importance of maintaining oxygen saturations>88%;To learn and demonstrate proper pursed lip breathing techniques or other breathing techniques. ;To learn and demonstrate proper use of respiratory medications To learn and exhibit compliance with exercise, home and travel O2 prescription;To learn and understand importance of monitoring SPO2 with pulse oximeter and demonstrate accurate use of the pulse oximeter.;To learn and understand importance of maintaining oxygen saturations>88%;To learn and demonstrate proper pursed lip breathing techniques or other breathing techniques. ;To learn and demonstrate proper use of respiratory medications      Long  Term Goals Exhibits compliance with exercise, home  and travel O2 prescription;Verbalizes importance of monitoring SPO2 with pulse oximeter and return demonstration;Maintenance of O2 saturations>88%;Exhibits proper breathing techniques, such as pursed lip breathing or other method taught during program session;Compliance with respiratory medication;Demonstrates proper use of MDI's Exhibits compliance with exercise, home  and travel O2 prescription;Verbalizes importance of monitoring SPO2 with pulse oximeter and return demonstration;Maintenance of O2 saturations>88%;Exhibits proper breathing techniques, such as pursed lip breathing or other method taught during program session;Compliance with respiratory  medication;Demonstrates proper use of MDI's      Comments -- no change in oxygen needs.      Goals/Expected Outcomes Compliance and understanding of oxygen saturation monitoring and breathing techniques to decreasae shortness of breath. Compliance and understanding of oxygen saturation monitoring and breathing techniques to decreasae shortness of breath.               Oxygen Discharge (Final Oxygen Re-Evaluation):  Oxygen Re-Evaluation - 12/01/21 1427       Program Oxygen Prescription   Program Oxygen Prescription Continuous    Liters per minute 2      Home Oxygen   Home Oxygen Device Portable Concentrator;Home Concentrator    Sleep Oxygen Prescription Continuous    Liters per minute 2    Home Exercise Oxygen Prescription Continuous    Liters per minute 2    Home Resting Oxygen Prescription Continuous    Liters per minute 2    Compliance with Home Oxygen Use Yes      Goals/Expected Outcomes   Short Term Goals To learn and exhibit compliance with exercise, home and travel O2 prescription;To learn and understand importance of monitoring SPO2 with pulse oximeter and demonstrate accurate use of the pulse oximeter.;To learn and understand importance of maintaining oxygen saturations>88%;To learn and demonstrate proper pursed lip breathing techniques or other breathing techniques. ;To learn and demonstrate proper use of respiratory medications    Long  Term Goals Exhibits compliance with exercise, home  and travel O2 prescription;Verbalizes importance of monitoring SPO2 with pulse oximeter and return demonstration;Maintenance of O2 saturations>88%;Exhibits proper breathing techniques, such as pursed lip breathing or other method taught during program session;Compliance with respiratory medication;Demonstrates proper use of MDI's    Comments no change in oxygen needs.    Goals/Expected Outcomes Compliance and understanding of oxygen saturation monitoring and breathing techniques to decreasae  shortness of breath.             Initial Exercise Prescription:  Initial Exercise Prescription - 11/03/21 1400  Date of Initial Exercise RX and Referring Provider   Date 11/03/21    Referring Provider Celine Mans    Expected Discharge Date 01/13/22      NuStep   Level 1    SPM 70    Minutes 15      Arm Ergometer   Level 1    Watts 1    RPM 60    Minutes 15      Prescription Details   Frequency (times per week) 2    Duration Progress to 30 minutes of continuous aerobic without signs/symptoms of physical distress      Intensity   THRR 40-80% of Max Heartrate 57-114    Ratings of Perceived Exertion 11-13    Perceived Dyspnea 0-4      Progression   Progression Continue to progress workloads to maintain intensity without signs/symptoms of physical distress.      Resistance Training   Training Prescription Yes    Weight red    Reps 10-15             Perform Capillary Blood Glucose checks as needed.  Exercise Prescription Changes:   Exercise Prescription Changes     Row Name 11/11/21 1400 11/25/21 1500           Response to Exercise   Blood Pressure (Admit) 124/70 148/62      Blood Pressure (Exercise) 124/70 --      Blood Pressure (Exit) 108/60 122/70      Heart Rate (Admit) 66 bpm 79 bpm      Heart Rate (Exercise) 77 bpm 94 bpm      Heart Rate (Exit) 70 bpm 81 bpm      Oxygen Saturation (Admit) 98 % 96 %      Oxygen Saturation (Exercise) 98 % 94 %      Oxygen Saturation (Exit) 96 % 93 %      Rating of Perceived Exertion (Exercise) 11 13      Perceived Dyspnea (Exercise) 2 1      Duration Progress to 30 minutes of  aerobic without signs/symptoms of physical distress Continue with 30 min of aerobic exercise without signs/symptoms of physical distress.      Intensity THRR unchanged THRR unchanged        Progression   Progression Continue to progress workloads to maintain intensity without signs/symptoms of physical distress. Continue to progress  workloads to maintain intensity without signs/symptoms of physical distress.        Resistance Training   Training Prescription Yes Yes      Weight red bands red bands      Reps 10-15 10-15      Time 10 Minutes 10 Minutes        Oxygen   Oxygen Continuous Continuous      Liters 2 2        NuStep   Level 1 2      SPM 70 70      Minutes 15 15      METs 1.6 1.7        Arm Ergometer   Level 1 1      Watts 1 2      RPM 38 --      Minutes 15 15        Oxygen   Maintain Oxygen Saturation 88% or higher 88% or higher               Exercise Comments:   Exercise Comments  [place] Name 11/11/21 1454           Exercise Comments Pt completed first day of exercise. Douglas Hansen exercised for 15 min on the Nustep and arm ergometer. He averaged 1.6 METs at level 1 on the Nustep and 38 rpms at level 1 on the arm ergometer. He performed the warmup and cooldown standing with verbal cues. Discussed METs as pt showed understanding.                Exercise Goals and Review:   Exercise Goals     Row Name 11/03/21 1419 11/05/21 0903 12/01/21 1424         Exercise Goals   Increase Physical Activity Yes Yes Yes     Intervention Provide advice, education, support and counseling about physical activity/exercise needs.;Develop an individualized exercise prescription for aerobic and resistive training based on initial evaluation findings, risk stratification, comorbidities and participant's personal goals. Provide advice, education, support and counseling about physical activity/exercise needs.;Develop an individualized exercise prescription for aerobic and resistive training based on initial evaluation findings, risk stratification, comorbidities and participant's personal goals. Provide advice, education, support and counseling about physical activity/exercise needs.;Develop an individualized exercise prescription for aerobic and resistive training based on initial evaluation findings, risk  stratification, comorbidities and participant's personal goals.     Expected Outcomes Short Term: Attend rehab on a regular basis to increase amount of physical activity.;Long Term: Add in home exercise to make exercise part of routine and to increase amount of physical activity.;Long Term: Exercising regularly at least 3-5 days a week. Short Term: Attend rehab on a regular basis to increase amount of physical activity.;Long Term: Add in home exercise to make exercise part of routine and to increase amount of physical activity.;Long Term: Exercising regularly at least 3-5 days a week. Short Term: Attend rehab on a regular basis to increase amount of physical activity.;Long Term: Add in home exercise to make exercise part of routine and to increase amount of physical activity.;Long Term: Exercising regularly at least 3-5 days a week.     Increase Strength and Stamina Yes Yes Yes     Intervention Provide advice, education, support and counseling about physical activity/exercise needs.;Develop an individualized exercise prescription for aerobic and resistive training based on initial evaluation findings, risk stratification, comorbidities and participant's personal goals. Provide advice, education, support and counseling about physical activity/exercise needs.;Develop an individualized exercise prescription for aerobic and resistive training based on initial evaluation findings, risk stratification, comorbidities and participant's personal goals. Provide advice, education, support and counseling about physical activity/exercise needs.;Develop an individualized exercise prescription for aerobic and resistive training based on initial evaluation findings, risk stratification, comorbidities and participant's personal goals.     Expected Outcomes Short Term: Increase workloads from initial exercise prescription for resistance, speed, and METs.;Short Term: Perform resistance training exercises routinely during rehab and  add in resistance training at home;Long Term: Improve cardiorespiratory fitness, muscular endurance and strength as measured by increased METs and functional capacity (6MWT) Short Term: Increase workloads from initial exercise prescription for resistance, speed, and METs.;Short Term: Perform resistance training exercises routinely during rehab and add in resistance training at home;Long Term: Improve cardiorespiratory fitness, muscular endurance and strength as measured by increased METs and functional capacity (6MWT) Short Term: Increase workloads from initial exercise prescription for resistance, speed, and METs.;Short Term: Perform resistance training exercises routinely during rehab and add in resistance training at home;Long Term: Improve cardiorespiratory fitness, muscular endurance and strength as measured by increased METs and functional capacity (  6MWT)     Able to understand and use rate of perceived exertion (RPE) scale Yes Yes Yes     Intervention Provide education and explanation on how to use RPE scale Provide education and explanation on how to use RPE scale Provide education and explanation on how to use RPE scale     Expected Outcomes Short Term: Able to use RPE daily in rehab to express subjective intensity level;Long Term:  Able to use RPE to guide intensity level when exercising independently Short Term: Able to use RPE daily in rehab to express subjective intensity level;Long Term:  Able to use RPE to guide intensity level when exercising independently Short Term: Able to use RPE daily in rehab to express subjective intensity level;Long Term:  Able to use RPE to guide intensity level when exercising independently     Able to understand and use Dyspnea scale Yes Yes Yes     Intervention Provide education and explanation on how to use Dyspnea scale Provide education and explanation on how to use Dyspnea scale Provide education and explanation on how to use Dyspnea scale     Expected Outcomes  Short Term: Able to use Dyspnea scale daily in rehab to express subjective sense of shortness of breath during exertion;Long Term: Able to use Dyspnea scale to guide intensity level when exercising independently Short Term: Able to use Dyspnea scale daily in rehab to express subjective sense of shortness of breath during exertion;Long Term: Able to use Dyspnea scale to guide intensity level when exercising independently Short Term: Able to use Dyspnea scale daily in rehab to express subjective sense of shortness of breath during exertion;Long Term: Able to use Dyspnea scale to guide intensity level when exercising independently     Able to check pulse independently Yes Yes Yes     Intervention Provide education and demonstration on how to check pulse in carotid and radial arteries.;Review the importance of being able to check your own pulse for safety during independent exercise Provide education and demonstration on how to check pulse in carotid and radial arteries.;Review the importance of being able to check your own pulse for safety during independent exercise Provide education and demonstration on how to check pulse in carotid and radial arteries.;Review the importance of being able to check your own pulse for safety during independent exercise     Expected Outcomes Short Term: Able to explain why pulse checking is important during independent exercise;Long Term: Able to check pulse independently and accurately Short Term: Able to explain why pulse checking is important during independent exercise;Long Term: Able to check pulse independently and accurately Short Term: Able to explain why pulse checking is important during independent exercise;Long Term: Able to check pulse independently and accurately     Understanding of Exercise Prescription Yes Yes Yes     Intervention Provide education, explanation, and written materials on patient's individual exercise prescription Provide education, explanation, and  written materials on patient's individual exercise prescription Provide education, explanation, and written materials on patient's individual exercise prescription     Expected Outcomes Short Term: Able to explain program exercise prescription;Long Term: Able to explain home exercise prescription to exercise independently Short Term: Able to explain program exercise prescription;Long Term: Able to explain home exercise prescription to exercise independently Short Term: Able to explain program exercise prescription;Long Term: Able to explain home exercise prescription to exercise independently              Exercise Goals Re-Evaluation :  Exercise Goals Re-Evaluation  [place] Name 11/05/21 6226 12/01/21 1424           Exercise Goal Re-Evaluation   Exercise Goals Review Increase Physical Activity;Increase Strength and Stamina;Able to understand and use rate of perceived exertion (RPE) scale;Able to understand and use Dyspnea scale;Knowledge and understanding of Target Heart Rate Range (THRR);Understanding of Exercise Prescription Increase Physical Activity;Increase Strength and Stamina;Able to understand and use rate of perceived exertion (RPE) scale;Able to understand and use Dyspnea scale;Knowledge and understanding of Target Heart Rate Range (THRR);Understanding of Exercise Prescription      Comments Pt has not began exercise yet. Will monitor and progress as tolerated. Pt has completed 5 exercise sessions. He missed one session for illness. He is exercising on the nustep for 15 min, level 2, METs 1.7. He then is evercising on the arm ergometer for 15 min, level 1 at 1-2 watts. He is tolerating fairly well. He performs warm up and cool down standing with verbal and demonstrative cues.  Will monitor and progress as tolerated. Will discuss home exercise soon.      Expected Outcomes Through exercise at rehab and home, the patient will decrease shortness of breath with daily activities and feel  confident in carrying out an exercise regimen at home. Through exercise at rehab and home, the patient will decrease shortness of breath with daily activities and feel confident in carrying out an exercise regimen at home.               Discharge Exercise Prescription (Final Exercise Prescription Changes):  Exercise Prescription Changes - 11/25/21 1500       Response to Exercise   Blood Pressure (Admit) 148/62    Blood Pressure (Exit) 122/70    Heart Rate (Admit) 79 bpm    Heart Rate (Exercise) 94 bpm    Heart Rate (Exit) 81 bpm    Oxygen Saturation (Admit) 96 %    Oxygen Saturation (Exercise) 94 %    Oxygen Saturation (Exit) 93 %    Rating of Perceived Exertion (Exercise) 13    Perceived Dyspnea (Exercise) 1    Duration Continue with 30 min of aerobic exercise without signs/symptoms of physical distress.    Intensity THRR unchanged      Progression   Progression Continue to progress workloads to maintain intensity without signs/symptoms of physical distress.      Resistance Training   Training Prescription Yes    Weight red bands    Reps 10-15    Time 10 Minutes      Oxygen   Oxygen Continuous    Liters 2      NuStep   Level 2    SPM 70    Minutes 15    METs 1.7      Arm Ergometer   Level 1    Watts 2    Minutes 15      Oxygen   Maintain Oxygen Saturation 88% or higher             Nutrition:  Target Goals: Understanding of nutrition guidelines, daily intake of sodium '1500mg'$ , cholesterol '200mg'$ , calories 30% from fat and 7% or less from saturated fats, daily to have 5 or more servings of fruits and vegetables.  Biometrics:  Pre Biometrics - 11/03/21 1108       Pre Biometrics   Grip Strength 24 kg              Nutrition Therapy Plan and Nutrition Goals:  Nutrition Therapy & Goals - 12/02/21 1610  Nutrition Therapy   Diet Heart Healthy/Carbohydrate Consistent diet    Drug/Food Interactions Statins/Certain Fruits      Personal  Nutrition Goals   Nutrition Goal Patient to build a healthy plate daily with a variety of fruits, vegetables, whole grains, lean protein/plant protein, and nonfat dairy as part of heart healthy lifestyle    Personal Goal #2 Patient to limit to '1500mg'$  of sodium daily    Personal Goal #3 Patient to identify and limit daily intake of saturated fat, trans fat, sodium, and refined carbohydrates    Comments Goals in progress. Douglas Hansen has started to reduce his sugary beverage consumption and switched to water and diet soda. Discussed increasing non-starchy vegetables and reducing fried foods. Douglas Hansen reports that he does not know how to cook and does continue to eat out frequently. His children remain very supportive.      Intervention Plan   Intervention Prescribe, educate and counsel regarding individualized specific dietary modifications aiming towards targeted core components such as weight, hypertension, lipid management, diabetes, heart failure and other comorbidities.    Expected Outcomes Short Term Goal: Understand basic principles of dietary content, such as calories, fat, sodium, cholesterol and nutrients.;Long Term Goal: Adherence to prescribed nutrition plan.             Nutrition Assessments:  Nutrition Assessments - 11/13/21 1426       Rate Your Plate Scores   Pre Score 46            MEDIFICTS Score Key: ?70 Need to make dietary changes  40-70 Heart Healthy Diet ? 40 Therapeutic Level Cholesterol Diet  Flowsheet Row PULMONARY REHAB CHRONIC OBSTRUCTIVE PULMONARY DISEASE from 11/13/2021 in [place]  Picture Your Plate Total Score on Admission 46      Picture Your Plate Scores: <07 Unhealthy dietary pattern with much room for improvement. 41-50 Dietary pattern unlikely to meet recommendations for good health and room for improvement. 51-60 More healthful dietary pattern, with some room for improvement.  >60 Healthy dietary pattern, although  there may be some specific behaviors that could be improved.    Nutrition Goals Re-Evaluation:  Nutrition Goals Re-Evaluation     Douglas Hansen Name 11/11/21 1544 11/20/21 1513 12/02/21 1610         Goals   Current Weight 199 lb 15.3 oz (90.7 kg) 199 lb 1.2 oz (90.3 kg) 191 lb 9.3 oz (86.9 kg)     Comment Most recent A1c was 6.7 He reports history of hyperparathyroidism that is monitored by PCP. He is down ~8# since starting with our program.     Expected Outcome -- Tremane reports choosing fast food most meals ([employer], [employer]) and drinking soda and sweet tea daily. He lives at home with his daughter and three adult grandchildren. His other daughter does provide a hot meal 2x/week that includes non-starchy vegetables. Educated/discussed high sodium foods/intake and impact to heart and lungs. Goals in progress. Douglas Hansen has started to reduce his sugary beverage consumption and switched to water and diet soda to aid with blood sugar control. Discussed increasing non-starchy vegetables and reducing fried foods. Douglas Hansen reports that he does not know how to cook and does continue to eat out frequently. His children remain very supportive.              Nutrition Goals Discharge (Final Nutrition Goals Re-Evaluation):  Nutrition Goals Re-Evaluation - 12/02/21 1610       Goals   Current Weight 191 lb 9.3 oz (86.9 kg)  Comment He is down ~8# since starting with our program.    Expected Outcome Goals in progress. Douglas Hansen has started to reduce his sugary beverage consumption and switched to water and diet soda to aid with blood sugar control. Discussed increasing non-starchy vegetables and reducing fried foods. Douglas Hansen reports that he does not know how to cook and does continue to eat out frequently. His children remain very supportive.             Psychosocial: Target Goals: Acknowledge presence or absence of significant depression and/or stress, maximize coping skills, provide positive support  system. Participant is able to verbalize types and ability to use techniques and skills needed for reducing stress and depression.  Initial Review & Psychosocial Screening:  Initial Psych Review & Screening - 11/03/21 1122       Initial Review   Current issues with History of Depression;Current Depression   Pt finished PTSD support group and is on medication for his depression.     Family Dynamics   Good Support System? Yes    Comments children      Barriers   Psychosocial barriers to participate in program The patient should benefit from training in stress management and relaxation.      Screening Interventions   Interventions Encouraged to exercise    Expected Outcomes Short Term goal: Identification and review with participant of any Quality of Life or Depression concerns found by scoring the questionnaire.;Long Term goal: The participant improves quality of Life and PHQ9 Scores as seen by post scores and/or verbalization of changes             Quality of Life Scores:  Scores of 19 and below usually indicate a poorer quality of life in these areas.  A difference of  2-3 points is a clinically meaningful difference.  A difference of 2-3 points in the total score of the Quality of Life Index has been associated with significant improvement in overall quality of life, self-image, physical symptoms, and general health in studies assessing change in quality of life.  PHQ-9: Review Flowsheet       11/03/2021  Depression screen PHQ 2/9  Decreased Interest 0  Down, Depressed, Hopeless 1  PHQ - 2 Score 1  Altered sleeping 2  Tired, decreased energy 1  Change in appetite 0  Feeling bad or failure about yourself  0  Trouble concentrating 0  Moving slowly or fidgety/restless 0  Suicidal thoughts 0  PHQ-9 Score 4  Difficult doing work/chores Somewhat difficult   Interpretation of Total Score  Total Score Depression Severity:  1-4 = Minimal depression, 5-9 = Mild depression,  10-14 = Moderate depression, 15-19 = Moderately severe depression, 20-27 = Severe depression   Psychosocial Evaluation and Intervention:  Psychosocial Evaluation - 11/03/21 1125       Psychosocial Evaluation & Interventions   Interventions Stress management education;Relaxation education;Encouraged to exercise with the program and follow exercise prescription    Expected Outcomes For Douglas Hansen to participate in Pulmonary Rehab    Continue Psychosocial Services  Follow up required by staff             Psychosocial Re-Evaluation:  Psychosocial Re-Evaluation     Douglas Hansen Name 11/11/21 0906 12/03/21 1028           Psychosocial Re-Evaluation   Current issues with History of Depression;Current Depression Current Depression;History of Depression      Comments No change in psychosocial assessment since orientation 11/03/2021.  He is controlled with current therapy  and medications, finished PTSD support group.  He will begin exercising today, 11/11/2021 in pulmonary rehab. Douglas Hansen has been participating in the [facility] program without any psychosocial barriers. He states that he is enjoying the program and interactions with other class members. No signs of depression noted at this time. Will continue to monitor for any psychosocial needs.      Expected Outcomes For Douglas Hansen to continue to be without barriers or psychosocial concerns while participating in pulmonary rehab. Will follow every 30 days for psychosocial assessment/changes. For him to attend the program without any psychosocial issues or concerns.      Interventions Stress management education;Encouraged to attend Pulmonary Rehabilitation for the exercise;Relaxation education Relaxation education;Encouraged to attend Pulmonary Rehabilitation for the exercise      Continue Psychosocial Services  Follow up required by staff No Follow up required               Psychosocial Discharge (Final Psychosocial Re-Evaluation):  Psychosocial Re-Evaluation -  12/03/21 1028       Psychosocial Re-Evaluation   Current issues with Current Depression;History of Depression    Comments Douglas Hansen has been participating in the [facility] program without any psychosocial barriers. He states that he is enjoying the program and interactions with other class members. No signs of depression noted at this time. Will continue to monitor for any psychosocial needs.    Expected Outcomes For him to attend the program without any psychosocial issues or concerns.    Interventions Relaxation education;Encouraged to attend Pulmonary Rehabilitation for the exercise    Continue Psychosocial Services  No Follow up required             Education: Education Goals: Education classes will be provided on a weekly basis, covering required topics. Participant will state understanding/return demonstration of topics presented.  Learning Barriers/Preferences:  Learning Barriers/Preferences - 11/03/21 1125       Learning Barriers/Preferences   Learning Barriers Sight   wears glasses   Learning Preferences Pictoral;Skilled Demonstration;Written Material             Education Topics: Introduction to Pulmonary Rehab Group instruction provided by PowerPoint, verbal discussion, and written material to support subject matter. Instructor reviews what Pulmonary Rehab is, the purpose of the program, and how patients are referred.     Know Your Numbers Group instruction that is supported by a PowerPoint presentation. Instructor discusses importance of knowing and understanding resting, exercise, and post-exercise oxygen saturation, heart rate, and blood pressure. Oxygen saturation, heart rate, blood pressure, rating of perceived exertion, and dyspnea are reviewed along with a normal range for these values.    Exercise for the Pulmonary Patient Group instruction that is supported by a PowerPoint presentation. Instructor discusses benefits of exercise, core components of exercise,  frequency, duration, and intensity of an exercise routine, importance of utilizing pulse oximetry during exercise, safety while exercising, and options of places to exercise outside of rehab.       MET Level  Group instruction provided by PowerPoint, verbal discussion, and written material to support subject matter. Instructor reviews what METs are and how to increase METs.    Pulmonary Medications Verbally interactive group education provided by instructor with focus on inhaled medications and proper administration.   Anatomy and Physiology of the Respiratory System Group instruction provided by PowerPoint, verbal discussion, and written material to support subject matter. Instructor reviews respiratory cycle and anatomical components of the respiratory system and their functions. Instructor also reviews differences in obstructive and restrictive respiratory  diseases with examples of each.    Oxygen Safety Group instruction provided by PowerPoint, verbal discussion, and written material to support subject matter. There is an overview of "What is Oxygen" and "Why do we need it".  Instructor also reviews how to create a safe environment for oxygen use, the importance of using oxygen as prescribed, and the risks of noncompliance. There is a brief discussion on traveling with oxygen and resources the patient may utilize. Flowsheet Row PULMONARY REHAB CHRONIC OBSTRUCTIVE PULMONARY DISEASE from 11/27/2021 in [place]  Date 11/13/21  Educator Kelle Darting  Instruction Review Code 2- Demonstrated Understanding       Oxygen Use Group instruction provided by PowerPoint, verbal discussion, and written material to discuss how supplemental oxygen is prescribed and different types of oxygen supply systems. Resources for more information are provided.  Flowsheet Row PULMONARY REHAB CHRONIC OBSTRUCTIVE PULMONARY DISEASE from 11/27/2021 in [place]  Date 11/20/21  Educator Kelle Darting  Instruction Review Code 1- Verbalizes Understanding       Breathing Techniques Group instruction that is supported by demonstration and informational handouts. Instructor discusses the benefits of pursed lip and diaphragmatic breathing and detailed demonstration on how to perform both.  Flowsheet Row PULMONARY REHAB CHRONIC OBSTRUCTIVE PULMONARY DISEASE from 11/27/2021 in [place]  Date 11/27/21  Educator EP-Kristan  Instruction Review Code 1- Verbalizes Understanding        Risk Factor Reduction Group instruction that is supported by a PowerPoint presentation. Instructor discusses the definition of a risk factor, different risk factors for pulmonary disease, and how the heart and lungs work together.   MD Day A group question and answer session with a medical doctor that allows participants to ask questions that relate to their pulmonary disease state.   Nutrition for the Pulmonary Patient Group instruction provided by PowerPoint slides, verbal discussion, and written materials to support subject matter. The instructor gives an explanation and review of healthy diet recommendations, which includes a discussion on weight management, recommendations for fruit and vegetable consumption, as well as protein, fluid, caffeine, fiber, sodium, sugar, and alcohol. Tips for eating when patients are short of breath are discussed.    Other Education Group or individual verbal, written, or video instructions that support the educational goals of the pulmonary rehab program.    Knowledge Questionnaire Score:  Knowledge Questionnaire Score - 11/03/21 1142       Knowledge Questionnaire Score   Pre Score 15/18             Core Components/Risk Factors/Patient Goals at Admission:  Personal Goals and Risk Factors at Admission - 11/03/21 1125       Core Components/Risk Factors/Patient Goals on Admission    Weight  Management Weight Loss;Yes    Intervention Weight Management: Develop a combined nutrition and exercise program designed to reach desired caloric intake, while maintaining appropriate intake of nutrient and fiber, sodium and fats, and appropriate energy expenditure required for the weight goal.;Weight Management: Provide education and appropriate resources to help participant work on and attain dietary goals.;Weight Management/Obesity: Establish reasonable short term and long term weight goals.;Obesity: Provide education and appropriate resources to help participant work on and attain dietary goals.    Expected Outcomes Short Term: Continue to assess and modify interventions until short term weight is achieved;Long Term: Adherence to nutrition and physical activity/exercise program aimed toward attainment of established weight goal;Weight Maintenance: Understanding of the daily nutrition guidelines, which  includes 25-35% calories from fat, 7% or less cal from saturated fats, less than $RemoveB'200mg'RiwDklln$  cholesterol, less than 1.5gm of sodium, & 5 or more servings of fruits and vegetables daily;Weight Loss: Understanding of general recommendations for a balanced deficit meal plan, which promotes 1-2 lb weight loss per week and includes a negative energy balance of [phone] kcal/d;Understanding recommendations for meals to include 15-35% energy as protein, 25-35% energy from fat, 35-60% energy from carbohydrates, less than $RemoveB'200mg'TGYotZbi$  of dietary cholesterol, 20-35 gm of total fiber daily;Understanding of distribution of calorie intake throughout the day with the consumption of 4-5 meals/snacks    Improve shortness of breath with ADL's Yes    Intervention Provide education, individualized exercise plan and daily activity instruction to help decrease symptoms of SOB with activities of daily living.    Expected Outcomes Short Term: Improve cardiorespiratory fitness to achieve a reduction of symptoms when performing ADLs;Long Term: Be  able to perform more ADLs without symptoms or delay the onset of symptoms             Core Components/Risk Factors/Patient Goals Review:   Goals and Risk Factor Review     [place] Name 11/11/21 0912 12/03/21 1033           Core Components/Risk Factors/Patient Goals Review   Personal Goals Review Weight Management/Obesity;Increase knowledge of respiratory medications and ability to use respiratory devices properly.;Improve shortness of breath with ADL's;Develop more efficient breathing techniques such as purse lipped breathing and diaphragmatic breathing and practicing self-pacing with activity. Weight Management/Obesity;Improve shortness of breath with ADL's;Develop more efficient breathing techniques such as purse lipped breathing and diaphragmatic breathing and practicing self-pacing with activity.;Increase knowledge of respiratory medications and ability to use respiratory devices properly.      Review Douglas Hansen will begin exercising in pulmonary rehab today, 11/11/2021.  At this time no goals have been met. Douglas Hansen has had some weight loss since starting the program. We will continue to monitor his weights. He is exercising on the nustep and arm ergometer. He is making some slow progress in increasing his workloads and METS. He is exercising on his prescribed oxygen of  2 liters. He is reporting mild to mild with difficulty SOB and saturations have been 96-100%.He has recieved oxygen use and safety education in class.      Expected Outcomes See admission goals. See admission goals               Core Components/Risk Factors/Patient Goals at Discharge (Final Review):   Goals and Risk Factor Review - 12/03/21 1033       Core Components/Risk Factors/Patient Goals Review   Personal Goals Review Weight Management/Obesity;Improve shortness of breath with ADL's;Develop more efficient breathing techniques such as purse lipped breathing and diaphragmatic breathing and practicing self-pacing with  activity.;Increase knowledge of respiratory medications and ability to use respiratory devices properly.    Review Douglas Hansen has had some weight loss since starting the program. We will continue to monitor his weights. He is exercising on the nustep and arm ergometer. He is making some slow progress in increasing his workloads and METS. He is exercising on his prescribed oxygen of  2 liters. He is reporting mild to mild with difficulty SOB and saturations have been 96-100%.He has recieved oxygen use and safety education in class.    Expected Outcomes See admission goals             ITP Comments: Pt is making expected progress toward Pulmonary Rehab goals after completing 6 sessions. Recommend  continued exercise, life style modification, education, and utilization of breathing techniques to increase stamina and strength, while also decreasing shortness of breath with exertion.  Dr. Rodman Pickle is Medical Director for Pulmonary Rehab at [facility].

## 2021-12-04 ENCOUNTER — Encounter (HOSPITAL_COMMUNITY)
Admission: RE | Admit: 2021-12-04 | Discharge: 2021-12-04 | Disposition: A | Discharge status: Home / Self Care | Payer: Medicare Other | Source: Ambulatory Visit | Attending: Internal Medicine | Admitting: Internal Medicine

## 2021-12-04 DIAGNOSIS — J449 Chronic obstructive pulmonary disease, unspecified: Secondary | ICD-10-CM | POA: Diagnosis not present

## 2021-12-04 NOTE — Progress Notes (Signed)
Daily Session Note  Patient Details  Name: Lilburn Straw MRN: [id number] Date of Birth: 11-24-1945 Referring Provider:   April Manson Pulmonary Rehab Walk Test from 11/03/2021 in [place]  Referring Provider Shearon Stalls       Encounter Date: 12/04/2021  Check In:  Session Check In - 12/04/21 1437       Check-In   Supervising physician immediately available to respond to emergencies [facility] - Physician supervision    Physician(s) Dr. Timoteo Gaul    Location [facility]    Staff Present Elmon Else, MS, ACSM-CEP, Exercise Physiologist;Randi Yevonne Pax, ACSM-CEP, Exercise Physiologist;Carlette Wilber Oliphant, RN, Roque Cash, RN    Virtual Visit No    Medication changes reported     No    Fall or balance concerns reported    No    Tobacco Cessation No Change    Warm-up and Cool-down Performed as group-led instruction    Resistance Training Performed Yes    VAD Patient? No    PAD/SET Patient? No      Pain Assessment   Currently in Pain? No/denies    Pain Score 0-No pain    Multiple Pain Sites No             Capillary Blood Glucose: No results found for this or any previous visit (from the past 24 hour(s)).    Social History   Tobacco Use  Smoking Status Former   Packs/day: 1.00   Years: 20.00   Total pack years: 20.00   Types: Cigarettes   Quit date: 03/09/1985   Years since quitting: 36.7  Smokeless Tobacco Never    Goals Met:  Proper associated with RPD/PD & O2 Sat Exercise tolerated well No report of concerns or symptoms today Strength training completed today  Goals Unmet:  Not Applicable  Comments: Service time is from 1337 to [place]    Dr. Rodman Pickle is Medical Director for Pulmonary Rehab at [facility].

## 2021-12-09 ENCOUNTER — Encounter (HOSPITAL_COMMUNITY)
Admission: RE | Admit: 2021-12-09 | Discharge: 2021-12-09 | Disposition: A | Discharge status: Home / Self Care | Payer: Medicare Other | Source: Ambulatory Visit | Attending: Internal Medicine | Admitting: Internal Medicine

## 2021-12-09 VITALS — Wt 200.4 lb

## 2021-12-09 DIAGNOSIS — Z5189 Encounter for other specified aftercare: Secondary | ICD-10-CM | POA: Diagnosis not present

## 2021-12-09 DIAGNOSIS — J449 Chronic obstructive pulmonary disease, unspecified: Secondary | ICD-10-CM | POA: Diagnosis not present

## 2021-12-09 NOTE — Progress Notes (Signed)
Daily Session Note  Patient Details  Name: Sequoyah Counterman MRN: [id number] Date of Birth: 08-Dec-1945 Referring Provider:   April Manson Pulmonary Rehab Walk Test from 11/03/2021 in [place]  Referring Provider Shearon Stalls       Encounter Date: 12/09/2021  Check In:  Session Check In - 12/09/21 1430       Check-In   Supervising physician immediately available to respond to emergencies [facility] - Physician supervision    Physician(s) Shearon Stalls    [facility]    Staff Present Elmon Else, MS, ACSM-CEP, Exercise Physiologist;Randi Yevonne Pax, ACSM-CEP, Exercise Physiologist;Carlette Wilber Oliphant, RN, Roque Cash, RN    Virtual Visit No    Medication changes reported     No    Fall or balance concerns reported    No    Tobacco Cessation No Change    Warm-up and Cool-down Performed as group-led instruction    Resistance Training Performed Yes    VAD Patient? No    PAD/SET Patient? No      Pain Assessment   Currently in Pain? No/denies    Pain Score 0-No pain    Multiple Pain Sites No             Capillary Blood Glucose: No results found for this or any previous visit (from the past 24 hour(s)).   Exercise Prescription Changes - 12/09/21 1500       Response to Exercise   Blood Pressure (Admit) 160/70    Blood Pressure (Exercise) 152/80    Blood Pressure (Exit) 140/68    Heart Rate (Admit) 76 bpm    Heart Rate (Exercise) 85 bpm    Heart Rate (Exit) 79 bpm    Oxygen Saturation (Admit) 98 %    Oxygen Saturation (Exercise) 97 %    Oxygen Saturation (Exit) 96 %    Rating of Perceived Exertion (Exercise) 13    Perceived Dyspnea (Exercise) 1    Duration Continue with 30 min of aerobic exercise without signs/symptoms of physical distress.    Intensity THRR unchanged      Progression   Progression Continue to progress workloads to maintain intensity without signs/symptoms of physical distress.      Resistance Training    Training Prescription Yes    Weight Red bands    Reps 10-15    Time 10 Minutes      Oxygen   Oxygen Continuous    Liters 2      NuStep   Level 3    SPM 70    Minutes 15    METs 1.9      Arm Ergometer   Level 2    Watts 4    Minutes 15             Social History   Tobacco Use  Smoking Status Former   Packs/day: 1.00   Years: 20.00   Total pack years: 20.00   Types: Cigarettes   Quit date: 03/09/1985   Years since quitting: 36.7  Smokeless Tobacco Never    Goals Met:  Proper associated with RPD/PD & O2 Sat Exercise tolerated well No report of concerns or symptoms today Strength training completed today  Goals Unmet:  Not Applicable  Comments: Service time is from 1316 to 1440    Dr. Rodman Pickle is Medical Director for Pulmonary Rehab at [facility].

## 2021-12-11 ENCOUNTER — Encounter (HOSPITAL_COMMUNITY)
Admission: RE | Admit: 2021-12-11 | Discharge: 2021-12-11 | Disposition: A | Discharge status: Home / Self Care | Payer: Medicare Other | Source: Ambulatory Visit | Attending: Internal Medicine | Admitting: Internal Medicine

## 2021-12-11 DIAGNOSIS — J449 Chronic obstructive pulmonary disease, unspecified: Secondary | ICD-10-CM

## 2021-12-11 DIAGNOSIS — Z5189 Encounter for other specified aftercare: Secondary | ICD-10-CM | POA: Diagnosis not present

## 2021-12-11 NOTE — Progress Notes (Signed)
Daily Session Note  Patient Details  Name: Douglas Hansen MRN: [id number] Date of Birth: Aug 10, 1945 Referring Provider:   April Manson Pulmonary Rehab Walk Test from 11/03/2021 in [place]  Referring Provider Shearon Stalls       Encounter Date: 12/11/2021  Check In:  Session Check In - 12/11/21 1430       Check-In   Supervising physician immediately available to respond to emergencies [facility] - Physician supervision    Physician(s) Shearon Stalls    [facility]    Staff Present Elmon Else, MS, ACSM-CEP, Exercise Physiologist;Anil Havard Yevonne Pax, ACSM-CEP, Exercise Physiologist;Lisa Ysidro Evert, RN;David Villarreal, MS, ACSM-CEP, CCRP, Exercise Physiologist    Virtual Visit No    Medication changes reported     No    Fall or balance concerns reported    No    Tobacco Cessation No Change    Warm-up and Cool-down Performed as group-led instruction    Resistance Training Performed Yes    VAD Patient? No    PAD/SET Patient? No      Pain Assessment   Currently in Pain? No/denies    Pain Score 0-No pain    Multiple Pain Sites No             Capillary Blood Glucose: No results found for this or any previous visit (from the past 24 hour(s)).   Exercise Prescription Changes - 12/11/21 1500       Home Exercise Plan   Plans to continue exercise at Home (comment)   pedal bike   Frequency Add 2 additional days to program exercise sessions.    Initial Home Exercises Provided 12/11/21             Social History   Tobacco Use  Smoking Status Former   Packs/day: 1.00   Years: 20.00   Total pack years: 20.00   Types: Cigarettes   Quit date: 03/09/1985   Years since quitting: 36.7  Smokeless Tobacco Never    Goals Met:  Independence with exercise equipment Exercise tolerated well No report of concerns or symptoms today Strength training completed today  Goals Unmet:  Not Applicable  Comments: Service time is from 1333 to  1445.    Dr. Rodman Pickle is Medical Director for Pulmonary Rehab at [facility].

## 2021-12-11 NOTE — Progress Notes (Signed)
Home Exercise Prescription I have reviewed a Home Exercise Prescription with Douglas Hansen. Pt is currently exercising on a pedal bike 15-20 min 1 nonrehab day a week. Encouraged him to build up to 30 min, 1-3 nonrehab days. He is also practicing the strength training exercises he does in class 2 extra days. Pt receptive.The patient stated that their goals were to keep exercising to be healthy and work on diet. We reviewed exercise guidelines, target heart rate during exercise, RPE Scale, weather conditions, endpoints for exercise, warmup and cool down. The patient is encouraged to come to me with any questions. I will continue to follow up with the patient to assist them with progression and safety.    Lucine Bilski [place], [place], ACSM-CEP 12/11/2021 3:29 PM

## 2021-12-16 ENCOUNTER — Encounter (HOSPITAL_COMMUNITY)
Admission: RE | Admit: 2021-12-16 | Discharge: 2021-12-16 | Disposition: A | Discharge status: Home / Self Care | Payer: Medicare Other | Source: Ambulatory Visit | Attending: Internal Medicine | Admitting: Internal Medicine

## 2021-12-16 DIAGNOSIS — Z5189 Encounter for other specified aftercare: Secondary | ICD-10-CM | POA: Diagnosis not present

## 2021-12-16 DIAGNOSIS — J449 Chronic obstructive pulmonary disease, unspecified: Secondary | ICD-10-CM | POA: Diagnosis not present

## 2021-12-16 NOTE — Progress Notes (Signed)
Daily Session Note  Patient Details  Name: Starlin Steib MRN: [id number] Date of Birth: 12-06-1945 Referring Provider:   April Manson Pulmonary Rehab Walk Test from 11/03/2021 in [place]  Referring Provider Shearon Stalls       Encounter Date: 12/16/2021  Check In:  Session Check In - 12/16/21 1425       Check-In   Supervising physician immediately available to respond to emergencies [facility] - Physician supervision    Physician(s) Dr. Erskine Emery    [facility]    Staff Present Rodney Langton, Cathleen Fears, MS, ACSM-CEP, Exercise Physiologist;Randi Olen Cordial BS, ACSM-CEP, Exercise Physiologist;David Makemson, MS, ACSM-CEP, CCRP, Exercise Physiologist    Virtual Visit No    Medication changes reported     No    Fall or balance concerns reported    No    Tobacco Cessation No Change    Warm-up and Cool-down Performed as group-led instruction    Resistance Training Performed Yes    VAD Patient? No    PAD/SET Patient? No      Pain Assessment   Currently in Pain? No/denies    Multiple Pain Sites No             Capillary Blood Glucose: No results found for this or any previous visit (from the past 24 hour(s)).    Social History   Tobacco Use  Smoking Status Former   Packs/day: 1.00   Years: 20.00   Total pack years: 20.00   Types: Cigarettes   Quit date: 03/09/1985   Years since quitting: 36.7  Smokeless Tobacco Never    Goals Met:  Proper associated with RPD/PD & O2 Sat Exercise tolerated well No report of concerns or symptoms today Strength training completed today  Goals Unmet:  Not Applicable  Comments: Service time is from [place] to 1440    Dr. Rodman Pickle is Medical Director for Pulmonary Rehab at [facility].

## 2021-12-18 ENCOUNTER — Encounter (HOSPITAL_COMMUNITY)
Admission: RE | Admit: 2021-12-18 | Discharge: 2021-12-18 | Disposition: A | Discharge status: Home / Self Care | Payer: Medicare Other | Source: Ambulatory Visit | Attending: Internal Medicine | Admitting: Internal Medicine

## 2021-12-18 DIAGNOSIS — J449 Chronic obstructive pulmonary disease, unspecified: Secondary | ICD-10-CM

## 2021-12-18 DIAGNOSIS — Z5189 Encounter for other specified aftercare: Secondary | ICD-10-CM | POA: Diagnosis not present

## 2021-12-18 NOTE — Progress Notes (Signed)
Daily Session Note  Patient Details  Name: Douglas Hansen MRN: [id number] Date of Birth: [birth year] Referring Provider:   April Manson Pulmonary Rehab Walk Test from 11/03/2021 in [place]  Referring Provider Shearon Stalls       Encounter Date: 12/18/2021  Check In:  Session Check In - 12/18/21 1430       Check-In   Supervising physician immediately available to respond to emergencies [facility] - Physician supervision    Physician(s) Erskine Emery    [facility]    Staff Present Rodney Langton, Cathleen Fears, MS, ACSM-CEP, Exercise Physiologist;Randi Yevonne Pax, ACSM-CEP, Exercise Physiologist;Jetta Walker BS, ACSM-CEP, Exercise Physiologist    Virtual Visit No    Medication changes reported     No    Fall or balance concerns reported    No    Tobacco Cessation No Change    Warm-up and Cool-down Performed as group-led instruction    Resistance Training Performed Yes    VAD Patient? No    PAD/SET Patient? No      Pain Assessment   Currently in Pain? No/denies    Pain Score 0-No pain    Multiple Pain Sites No             Capillary Blood Glucose: No results found for this or any previous visit (from the past 24 hour(s)).    Social History   Tobacco Use  Smoking Status Former   Packs/day: 1.00   Years: 20.00   Total pack years: 20.00   Types: Cigarettes   Quit date: 03/09/1985   Years since quitting: 36.8  Smokeless Tobacco Never    Goals Met:  Proper associated with RPD/PD & O2 Sat Exercise tolerated well No report of concerns or symptoms today Strength training completed today  Goals Unmet:  Not Applicable  Comments: Service time is from 1320 to [place]    Dr. Rodman Pickle is Medical Director for Pulmonary Rehab at [facility].

## 2021-12-23 ENCOUNTER — Encounter (HOSPITAL_COMMUNITY)
Admission: RE | Admit: 2021-12-23 | Discharge: 2021-12-23 | Disposition: A | Discharge status: Home / Self Care | Payer: Medicare Other | Source: Ambulatory Visit | Attending: Internal Medicine | Admitting: Internal Medicine

## 2021-12-23 VITALS — Wt 195.5 lb

## 2021-12-23 DIAGNOSIS — Z5189 Encounter for other specified aftercare: Secondary | ICD-10-CM | POA: Diagnosis not present

## 2021-12-23 DIAGNOSIS — J449 Chronic obstructive pulmonary disease, unspecified: Secondary | ICD-10-CM | POA: Diagnosis not present

## 2021-12-23 NOTE — Progress Notes (Signed)
Daily Session Note  Patient Details  Name: Douglas Hansen MRN: [id number] Date of Birth: Dec 14, 1945 Referring Provider:   April Manson Pulmonary Rehab Walk Test from 11/03/2021 in [place]  Referring Provider Shearon Stalls       Encounter Date: 12/23/2021  Check In:  Session Check In - 12/23/21 1429       Check-In   Supervising physician immediately available to respond to emergencies [facility] - Physician supervision    Physician(s) Tacy Learn    Location [facility]    Staff Present Rodney Langton, Cathleen Fears, MS, ACSM-CEP, Exercise Physiologist;Randi Olen Cordial BS, ACSM-CEP, Exercise Physiologist;David Lilyan Punt, MS, ACSM-CEP, CCRP, Exercise Physiologist;Samantha [place], RD, LDN    Virtual Visit No    Medication changes reported     No    Fall or balance concerns reported    No    Tobacco Cessation No Change    Warm-up and Cool-down Performed as group-led instruction    Resistance Training Performed Yes    VAD Patient? No    PAD/SET Patient? No      Pain Assessment   Currently in Pain? No/denies    Pain Score 0-No pain    Multiple Pain Sites No             Capillary Blood Glucose: No results found for this or any previous visit (from the past 24 hour(s)).   Exercise Prescription Changes - 12/23/21 1500       Response to Exercise   Blood Pressure (Admit) 138/68    Blood Pressure (Exercise) 134/64    Blood Pressure (Exit) 130/74    Heart Rate (Admit) 74 bpm    Heart Rate (Exercise) 86 bpm    Heart Rate (Exit) 81 bpm    Oxygen Saturation (Admit) 96 %    Oxygen Saturation (Exercise) 97 %    Oxygen Saturation (Exit) 99 %    Rating of Perceived Exertion (Exercise) 15    Perceived Dyspnea (Exercise) 2    Duration Continue with 30 min of aerobic exercise without signs/symptoms of physical distress.    Intensity THRR unchanged      Progression   Progression Continue to progress workloads to maintain intensity without signs/symptoms  of physical distress.      Resistance Training   Training Prescription Yes    Weight Red bands    Reps 10-15    Time 10 Minutes      Oxygen   Oxygen Continuous    Liters 2      NuStep   Level 4    SPM 70    Minutes 15    METs 2.1      Arm Ergometer   Level 2.5    Watts 4    RPM 36    Minutes 15      Oxygen   Maintain Oxygen Saturation 88% or higher             Social History   Tobacco Use  Smoking Status Former   Packs/day: 1.00   Years: 20.00   Total pack years: 20.00   Types: Cigarettes   Quit date: 03/09/1985   Years since quitting: 36.8  Smokeless Tobacco Never    Goals Met:  Proper associated with RPD/PD & O2 Sat Exercise tolerated well No report of concerns or symptoms today Strength training completed today  Goals Unmet:  Not Applicable  Comments: Service time is from 1322 to 1448.    Dr. Rodman Pickle is Medical Director  for Pulmonary Rehab at [facility].

## 2021-12-25 ENCOUNTER — Encounter (HOSPITAL_COMMUNITY)
Admission: RE | Admit: 2021-12-25 | Discharge: 2021-12-25 | Disposition: A | Discharge status: Home / Self Care | Payer: Medicare Other | Source: Ambulatory Visit | Attending: Internal Medicine | Admitting: Internal Medicine

## 2021-12-25 DIAGNOSIS — J449 Chronic obstructive pulmonary disease, unspecified: Secondary | ICD-10-CM | POA: Diagnosis not present

## 2021-12-25 DIAGNOSIS — Z5189 Encounter for other specified aftercare: Secondary | ICD-10-CM | POA: Diagnosis not present

## 2021-12-26 NOTE — Progress Notes (Signed)
Daily Session Note  Patient Details  Name: Douglas Hansen MRN: [id number] Date of Birth: [birth year] Referring Provider:   April Manson Pulmonary Rehab Walk Test from 11/03/2021 in [place]  Referring Provider Shearon Stalls       Encounter Date: 12/25/2021  Check In:  Session Check In - 12/25/21 1431       Check-In   Supervising physician immediately available to respond to emergencies [facility] - Physician supervision    Physician(s) Tacy Learn    Location [facility]    Staff Present Rodney Langton, Cathleen Fears, MS, ACSM-CEP, Exercise Physiologist;Jeven Topper Olen Cordial BS, ACSM-CEP, Exercise Physiologist;David Lilyan Punt, MS, ACSM-CEP, CCRP, Exercise Physiologist;Samantha [place], RD, LDN    Virtual Visit No    Medication changes reported     No    Fall or balance concerns reported    No    Tobacco Cessation No Change    Warm-up and Cool-down Performed as group-led instruction    Resistance Training Performed Yes    VAD Patient? No    PAD/SET Patient? No      Pain Assessment   Currently in Pain? No/denies    Pain Score 0-No pain    Multiple Pain Sites No             Capillary Blood Glucose: No results found for this or any previous visit (from the past 24 hour(s)).    Social History   Tobacco Use  Smoking Status Former   Packs/day: 1.00   Years: 20.00   Total pack years: 20.00   Types: Cigarettes   Quit date: 03/09/1985   Years since quitting: 36.8  Smokeless Tobacco Never    Goals Met:  Independence with exercise equipment Exercise tolerated well No report of concerns or symptoms today  Goals Unmet:  Not Applicable  Comments: Pt needed to leave before resistance training today. Service time is from 1325 to 1426.    Dr. Rodman Pickle is Medical Director for Pulmonary Rehab at [facility].

## 2021-12-30 ENCOUNTER — Encounter (HOSPITAL_COMMUNITY)
Admission: RE | Admit: 2021-12-30 | Discharge: 2021-12-30 | Disposition: A | Discharge status: Home / Self Care | Payer: Medicare Other | Source: Ambulatory Visit | Attending: Internal Medicine | Admitting: Internal Medicine

## 2021-12-30 DIAGNOSIS — J449 Chronic obstructive pulmonary disease, unspecified: Secondary | ICD-10-CM

## 2021-12-30 DIAGNOSIS — Z5189 Encounter for other specified aftercare: Secondary | ICD-10-CM | POA: Diagnosis not present

## 2021-12-30 NOTE — Progress Notes (Signed)
Daily Session Note  Patient Details  Name: Douglas Hansen MRN: [id number] Date of Birth: 1945-10-04 Referring Provider:   April Manson Pulmonary Rehab Walk Test from 11/03/2021 in [place]  Referring Provider Shearon Stalls       Encounter Date: 12/30/2021  Check In:  Session Check In - 12/30/21 1433       Check-In   Supervising physician immediately available to respond to emergencies [facility] - Physician supervision    Physician(s) Gala Murdoch    [facility]    Staff Present Rodney Langton, Cathleen Fears, MS, ACSM-CEP, Exercise Physiologist;Randi Yevonne Pax, ACSM-CEP, Exercise Physiologist;Samantha [place], RD, LDN;Olinty Celesta Aver, MS, ACSM-CEP, Exercise Physiologist    Virtual Visit No    Medication changes reported     No    Fall or balance concerns reported    No    Tobacco Cessation No Change    Warm-up and Cool-down Performed as group-led instruction    Resistance Training Performed Yes    VAD Patient? No    PAD/SET Patient? No      Pain Assessment   Currently in Pain? No/denies    Pain Score 0-No pain    Multiple Pain Sites No             Capillary Blood Glucose: No results found for this or any previous visit (from the past 24 hour(s)).    Social History   Tobacco Use  Smoking Status Former   Packs/day: 1.00   Years: 20.00   Total pack years: 20.00   Types: Cigarettes   Quit date: 03/09/1985   Years since quitting: 36.8  Smokeless Tobacco Never    Goals Met:  Proper associated with RPD/PD & O2 Sat Exercise tolerated well No report of concerns or symptoms today Strength training completed today  Goals Unmet:  Not Applicable  Comments: Service time is from 1325 to 1440.    Dr. Rodman Pickle is Medical Director for Pulmonary Rehab at [facility].

## 2021-12-31 NOTE — Progress Notes (Signed)
Pulmonary Individual Treatment Plan  Patient Details  Name: Douglas Hansen MRN: [id number] Date of Birth: 1945-10-28 Referring Provider:   April Manson Pulmonary Rehab Walk Test from 11/03/2021 in [place]  Referring Provider Shearon Stalls       Initial Encounter Date:  Flowsheet Row Pulmonary Rehab Walk Test from 11/03/2021 in [place]  Date 11/03/21       Visit Diagnosis: Stage 3 severe COPD by GOLD classification ([place])  Patient's Home Medications on Admission:   Current Outpatient Medications:    albuterol (PROVENTIL) (2.5 MG/3ML) 0.083% nebulizer solution, Take 3 mLs (2.5 mg total) by nebulization every 4 (four) hours as needed for wheezing or shortness of breath (coughing fits)., Disp: 75 mL, Rfl: 1   albuterol (VENTOLIN HFA) 108 (90 Base) MCG/ACT inhaler, INHALE 2 PUFFS BY MOUTH EVERY 4 HOURS AS NEEDED FOR WHEEZING OR SHORTNESS OF BREATH (Patient taking differently: Inhale 2 puffs into the lungs every 4 (four) hours as needed for wheezing or shortness of breath.), Disp: 42.5 g, Rfl: 0   amLODipine (NORVASC) 10 MG tablet, Take 10 mg by mouth daily., Disp: , Rfl:    ascorbic acid (VITAMIN C) 500 MG tablet, Take 1,000 mg by mouth daily. , Disp: , Rfl:    aspirin EC 81 MG EC tablet, Take 1 tablet (81 mg total) by mouth daily., Disp: 90 tablet, Rfl: 3   azelastine (ASTELIN) 0.1 % nasal spray, Place 1-2 sprays into both nostrils 2 (two) times daily as needed (nasal drainage). Use in each nostril as directed (Patient taking differently: Place 1 spray into both nostrils 2 (two) times daily as needed for allergies or rhinitis (nasal drainage).), Disp: 30 mL, Rfl: 5   Budeson-Glycopyrrol-Formoterol (BREZTRI AEROSPHERE) 160-9-4.8 MCG/ACT AERO, Inhale 2 puffs into the lungs in the morning and at bedtime. with spacer and rinse mouth afterwards., Disp: 10.7 g, Rfl: 5   budesonide-formoterol (SYMBICORT) 80-4.5 MCG/ACT inhaler, Inhale 2  puffs into the lungs 2 (two) times daily., Disp: , Rfl:    calcipotriene (DOVONOX) 0.005 % cream, Apply 1 Application topically daily as needed (feet)., Disp: , Rfl:    carvedilol (COREG) 12.5 MG tablet, Take 6.25 mg by mouth 2 (two) times daily with a meal. , Disp: , Rfl:    Cellulose Carmellose Sodium POWD, Apply 1 application topically daily as needed (irritation). Apply to feet, Disp: , Rfl:    Cholecalciferol (VITAMIN D-3 PO), Take 1 capsule by mouth daily., Disp: , Rfl:    clobetasol ointment (TEMOVATE) 8.56 %, Apply 1 application  topically 2 (two) times daily as needed (rash)., Disp: , Rfl:    cyclobenzaprine (FLEXERIL) 5 MG tablet, Take 10 mg by mouth at bedtime as needed for muscle spasms., Disp: , Rfl:    doxazosin (CARDURA) 1 MG tablet, Take 1 mg by mouth daily., Disp: , Rfl:    EPINEPHrine 0.3 mg/0.3 mL IJ SOAJ injection, Inject 0.3 mLs (0.3 mg total) into the muscle as needed for anaphylaxis., Disp: 2 each, Rfl: 1   EYLEA 2 MG/0.05ML SOLN, Place into the left eye every 30 (thirty) days. INJECTION, Disp: , Rfl:    Fluocinolone Acetonide Body 0.01 % OIL, Apply 1 application  topically daily as needed (dry skin)., Disp: , Rfl:    fluticasone (FLONASE) 50 MCG/ACT nasal spray, Place 1 spray into both nostrils 2 (two) times daily as needed (nasal congestion)., Disp: 16 g, Rfl: 5   furosemide (LASIX) 20 MG tablet, Take 20 mg by  mouth daily., Disp: , Rfl:    ketoconazole (NIZORAL) 2 % shampoo, Apply 1 application  topically every Monday, Wednesday, and Friday., Disp: , Rfl:    metFORMIN (GLUCOPHAGE) 500 MG tablet, Take 250 mg by mouth 2 (two) times daily with a meal., Disp: , Rfl:    methocarbamol (ROBAXIN) 500 MG tablet, Take 1.5 tablets (750 mg total) by mouth every 6 (six) hours as needed for up to 30 doses for muscle spasms (Neck Stiffness)., Disp: 30 tablet, Rfl: 0   montelukast (SINGULAIR) 10 MG tablet, Take 1 tablet (10 mg total) by mouth at bedtime., Disp: 30 tablet, Rfl: 5   Multiple  Vitamins-Minerals (MULTIVITAMIN & MINERAL PO), Take 1 tablet by mouth daily. Reported on 06/17/2015, Disp: , Rfl:    omeprazole (PRILOSEC) 20 MG capsule, Take 20 mg by mouth daily., Disp: , Rfl:    OneTouch Delica Lancets 20N MISC, daily., Disp: , Rfl:    ONETOUCH ULTRA test strip, as needed. , Disp: , Rfl:    OXYGEN, Inhale 2 L into the lungs continuous., Disp: , Rfl:    potassium chloride (KLOR-CON) 10 MEQ tablet, Take 2 tablets (20 mEq total) by mouth daily for 7 days., Disp: 14 tablet, Rfl: 0   Respiratory Therapy Supplies (FLUTTER) DEVI, Use as directed, Disp: 1 each, Rfl: 0   rosuvastatin (CRESTOR) 20 MG tablet, Take 10 mg by mouth at bedtime., Disp: , Rfl:    sertraline (ZOLOFT) 100 MG tablet, Take 100 mg by mouth daily., Disp: , Rfl:    simethicone (MYLICON) 80 MG chewable tablet, Chew 80 mg by mouth daily., Disp: , Rfl:   Current Facility-Administered Medications:    Benralizumab SOSY 30 mg, 30 mg, Subcutaneous, Q8 Weeks, Kim, Yoon M, DO, 30 mg at 12/31/20 1024   tezepelumab-ekko (TEZSPIRE) 210 MG/1.91ML syringe 210 mg, 210 mg, Subcutaneous, Q28 days, Garnet Sierras, DO, 210 mg at 08/05/21 1345  Past Medical History: Past Medical History:  Diagnosis Date   Agent orange exposure    Arthritis    Asthma    Asthma with acute exacerbation 06/17/2015   COPD (chronic obstructive pulmonary disease) ([place])    Diabetes mellitus    Hyperlipemia    Hypertension    OSA on CPAP    Pneumonia    Pneumonia due to COVID-19 virus 11/22/2019   PTSD (post-traumatic stress disorder)     Tobacco Use: Social History   Tobacco Use  Smoking Status Former   Packs/day: 1.00   Years: 20.00   Total pack years: 20.00   Types: Cigarettes   Quit date: 03/09/1985   Years since quitting: 36.8  Smokeless Tobacco Never    Labs: Review Flowsheet       Latest Ref Rng & Units 02/06/2012 11/22/2019 01/02/2020  Labs for ITP Cardiac and Pulmonary Rehab  Trlycerides <150 mg/dL - 223  -  Hemoglobin A1c 4.8 -  5.6 % - - 6.7   TCO2 0 - 100 mmol/L 29  - -    Capillary Blood Glucose: Lab Results  Component Value Date   GLUCAP 100 (H) 11/13/2021   GLUCAP 117 (H) 11/13/2021   GLUCAP 110 (H) 11/11/2021   GLUCAP 127 (H) 11/11/2021   GLUCAP 127 (H) 11/03/2021     Pulmonary Assessment Scores:  Pulmonary Assessment Scores     Row Name 11/03/21 1130         ADL UCSD   ADL Phase Entry     SOB Score total 24  CAT Score   CAT Score 20       mMRC Score   mMRC Score 4             UCSD: Self-administered rating of dyspnea associated with activities of daily living (ADLs) 6-point scale (0 = "not at all" to 5 = "maximal or unable to do because of breathlessness")  Scoring Scores range from 0 to 120.  Minimally important difference is 5 units  CAT: CAT can identify the health impairment of COPD patients and is better correlated with disease progression.  CAT has a scoring range of zero to 40. The CAT score is classified into four groups of low (less than 10), medium (10 - 20), high (21-30) and very high (31-40) based on the impact level of disease on health status. A CAT score over 10 suggests significant symptoms.  A worsening CAT score could be explained by an exacerbation, poor medication adherence, poor inhaler technique, or progression of COPD or comorbid conditions.  CAT MCID is 2 points  mMRC: mMRC (Modified Medical Research Council) Dyspnea Scale is used to assess the degree of baseline functional disability in patients of respiratory disease due to dyspnea. No minimal important difference is established. A decrease in score of 1 point or greater is considered a positive change.   Pulmonary Function Assessment:  Pulmonary Function Assessment - 11/03/21 1129       Breath   Bilateral Breath Sounds Clear    Shortness of Breath Yes;Limiting activity             Exercise Target Goals: Exercise Program Goal: Individual exercise prescription set using results from  initial 6 min walk test and THRR while considering  patient's activity barriers and safety.   Exercise Prescription Goal: Initial exercise prescription builds to 30-45 minutes a day of aerobic activity, 2-3 days per week.  Home exercise guidelines will be given to patient during program as part of exercise prescription that the participant will acknowledge.  Activity Barriers & Risk Stratification:  Activity Barriers & Cardiac Risk Stratification - 11/03/21 1131       Activity Barriers & Cardiac Risk Stratification   Activity Barriers Arthritis;Shortness of Breath;Muscular Weakness;Deconditioning;History of Falls;Balance Concerns;Neck/Spine Problems             6 Minute Walk:  6 Minute Walk     Row Name 11/03/21 1253         6 Minute Walk   Phase Initial     Distance 744 feet     Walk Time 6 minutes     # of Rest Breaks 0     MPH 1.41     METS 1.2     RPE 12     Perceived Dyspnea  2     VO2 Peak 4.19     Symptoms No     Resting HR 80 bpm     Resting BP 124/62     Resting Oxygen Saturation  99 %     Exercise Oxygen Saturation  during 6 min walk 94 %     Max Ex. HR 90 bpm     Max Ex. BP 140/64     2 Minute Post BP 120/64       Interval HR   1 Minute HR 80     2 Minute HR 79     3 Minute HR 83     4 Minute HR 90     5 Minute HR 89     6  Minute HR 89     2 Minute Post HR 74     Interval Heart Rate? Yes       Interval Oxygen   Interval Oxygen? Yes     Baseline Oxygen Saturation % 99 %     1 Minute Oxygen Saturation % 100 %     1 Minute Liters of Oxygen 2 L     2 Minute Oxygen Saturation % 99 %     2 Minute Liters of Oxygen 2 L     3 Minute Oxygen Saturation % 98 %     3 Minute Liters of Oxygen 2 L     4 Minute Oxygen Saturation % 97 %     4 Minute Liters of Oxygen 2 L     5 Minute Oxygen Saturation % 94 %     5 Minute Liters of Oxygen 2 L     6 Minute Oxygen Saturation % 96 %     6 Minute Liters of Oxygen 2 L     2 Minute Post Oxygen Saturation % 100 %      2 Minute Post Liters of Oxygen 2 L              Oxygen Initial Assessment:  Oxygen Initial Assessment - 11/03/21 1129       Home Oxygen   Home Oxygen Device Portable Concentrator;Home Concentrator    Sleep Oxygen Prescription Continuous    Liters per minute 2    Home Exercise Oxygen Prescription Continuous    Liters per minute 2    Home Resting Oxygen Prescription Continuous    Liters per minute 2    Compliance with Home Oxygen Use Yes      Initial 6 min Walk   Oxygen Used Continuous    Liters per minute 2      Program Oxygen Prescription   Program Oxygen Prescription Continuous    Liters per minute 2      Intervention   Short Term Goals To learn and exhibit compliance with exercise, home and travel O2 prescription;To learn and understand importance of monitoring SPO2 with pulse oximeter and demonstrate accurate use of the pulse oximeter.;To learn and understand importance of maintaining oxygen saturations>88%;To learn and demonstrate proper pursed lip breathing techniques or other breathing techniques. ;To learn and demonstrate proper use of respiratory medications    Long  Term Goals Exhibits compliance with exercise, home  and travel O2 prescription;Verbalizes importance of monitoring SPO2 with pulse oximeter and return demonstration;Maintenance of O2 saturations>88%;Exhibits proper breathing techniques, such as pursed lip breathing or other method taught during program session;Compliance with respiratory medication;Demonstrates proper use of MDI's             Oxygen Re-Evaluation:  Oxygen Re-Evaluation     Row Name 11/05/21 0905 12/01/21 1427 12/26/21 0954         Program Oxygen Prescription   Program Oxygen Prescription Continuous Continuous Continuous     Liters per minute _0 Comments Pt has not began to exercise in class yet -- --       Home Oxygen   Home Oxygen Device Portable Concentrator;Home Concentrator Portable Concentrator;Home  Concentrator Portable Concentrator;Home Concentrator     Sleep Oxygen Prescription Continuous Continuous Continuous     Liters per minute _1 Home Exercise Oxygen Prescription Continuous Continuous Continuous     Liters per minute _2 Home Resting  Oxygen Prescription Continuous Continuous Continuous     Liters per minute _0 Compliance with Home Oxygen Use Yes Yes Yes       Goals/Expected Outcomes   Short Term Goals To learn and exhibit compliance with exercise, home and travel O2 prescription;To learn and understand importance of monitoring SPO2 with pulse oximeter and demonstrate accurate use of the pulse oximeter.;To learn and understand importance of maintaining oxygen saturations>88%;To learn and demonstrate proper pursed lip breathing techniques or other breathing techniques. ;To learn and demonstrate proper use of respiratory medications To learn and exhibit compliance with exercise, home and travel O2 prescription;To learn and understand importance of monitoring SPO2 with pulse oximeter and demonstrate accurate use of the pulse oximeter.;To learn and understand importance of maintaining oxygen saturations>88%;To learn and demonstrate proper pursed lip breathing techniques or other breathing techniques. ;To learn and demonstrate proper use of respiratory medications To learn and exhibit compliance with exercise, home and travel O2 prescription;To learn and understand importance of monitoring SPO2 with pulse oximeter and demonstrate accurate use of the pulse oximeter.;To learn and understand importance of maintaining oxygen saturations>88%;To learn and demonstrate proper pursed lip breathing techniques or other breathing techniques. ;To learn and demonstrate proper use of respiratory medications     Long  Term Goals Exhibits compliance with exercise, home  and travel O2 prescription;Verbalizes importance of monitoring SPO2 with pulse oximeter and return demonstration;Maintenance of  O2 saturations>88%;Exhibits proper breathing techniques, such as pursed lip breathing or other method taught during program session;Compliance with respiratory medication;Demonstrates proper use of MDI's Exhibits compliance with exercise, home  and travel O2 prescription;Verbalizes importance of monitoring SPO2 with pulse oximeter and return demonstration;Maintenance of O2 saturations>88%;Exhibits proper breathing techniques, such as pursed lip breathing or other method taught during program session;Compliance with respiratory medication;Demonstrates proper use of MDI's Exhibits compliance with exercise, home  and travel O2 prescription;Verbalizes importance of monitoring SPO2 with pulse oximeter and return demonstration;Maintenance of O2 saturations>88%;Exhibits proper breathing techniques, such as pursed lip breathing or other method taught during program session;Compliance with respiratory medication;Demonstrates proper use of MDI's     Comments -- no change in oxygen needs. no change in oxygen needs.     Goals/Expected Outcomes Compliance and understanding of oxygen saturation monitoring and breathing techniques to decreasae shortness of breath. Compliance and understanding of oxygen saturation monitoring and breathing techniques to decreasae shortness of breath. Compliance and understanding of oxygen saturation monitoring and breathing techniques to decreasae shortness of breath.              Oxygen Discharge (Final Oxygen Re-Evaluation):  Oxygen Re-Evaluation - 12/26/21 0954       Program Oxygen Prescription   Program Oxygen Prescription Continuous    Liters per minute 2      Home Oxygen   Home Oxygen Device Portable Concentrator;Home Concentrator    Sleep Oxygen Prescription Continuous    Liters per minute 2    Home Exercise Oxygen Prescription Continuous    Liters per minute 2    Home Resting Oxygen Prescription Continuous    Liters per minute 2    Compliance with Home Oxygen Use  Yes      Goals/Expected Outcomes   Short Term Goals To learn and exhibit compliance with exercise, home and travel O2 prescription;To learn and understand importance of monitoring SPO2 with pulse oximeter and demonstrate accurate use of the pulse oximeter.;To learn and understand importance of maintaining oxygen saturations>88%;To learn and demonstrate proper pursed lip breathing  techniques or other breathing techniques. ;To learn and demonstrate proper use of respiratory medications    Long  Term Goals Exhibits compliance with exercise, home  and travel O2 prescription;Verbalizes importance of monitoring SPO2 with pulse oximeter and return demonstration;Maintenance of O2 saturations>88%;Exhibits proper breathing techniques, such as pursed lip breathing or other method taught during program session;Compliance with respiratory medication;Demonstrates proper use of MDI's    Comments no change in oxygen needs.    Goals/Expected Outcomes Compliance and understanding of oxygen saturation monitoring and breathing techniques to decreasae shortness of breath.             Initial Exercise Prescription:  Initial Exercise Prescription - 11/03/21 1400       Date of Initial Exercise RX and Referring Provider   Date 11/03/21    Referring Provider Shearon Stalls    Expected Discharge Date 01/13/22      NuStep   Level 1    SPM 70    Minutes 15      Arm Ergometer   Level 1    Watts 1    RPM 60    Minutes 15      Prescription Details   Frequency (times per week) 2    Duration Progress to 30 minutes of continuous aerobic without signs/symptoms of physical distress      Intensity   THRR 40-80% of Max Heartrate 57-114    Ratings of Perceived Exertion 11-13    Perceived Dyspnea 0-4      Progression   Progression Continue to progress workloads to maintain intensity without signs/symptoms of physical distress.      Resistance Training   Training Prescription Yes    Weight red    Reps 10-15              Perform Capillary Blood Glucose checks as needed.  Exercise Prescription Changes:   Exercise Prescription Changes     Row Name 11/11/21 1400 11/25/21 1500 12/09/21 1500 12/11/21 1500 12/23/21 1500     Response to Exercise   Blood Pressure (Admit) 124/70 148/62 160/70 -- 138/68   Blood Pressure (Exercise) 124/70 -- 152/80 -- 134/64   Blood Pressure (Exit) 108/60 122/70 140/68 -- 130/74   Heart Rate (Admit) 66 bpm 79 bpm 76 bpm -- 74 bpm   Heart Rate (Exercise) 77 bpm 94 bpm 85 bpm -- 86 bpm   Heart Rate (Exit) 70 bpm 81 bpm 79 bpm -- 81 bpm   Oxygen Saturation (Admit) 98 % 96 % 98 % -- 96 %   Oxygen Saturation (Exercise) 98 % 94 % 97 % -- 97 %   Oxygen Saturation (Exit) 96 % 93 % 96 % -- 99 %   Rating of Perceived Exertion (Exercise) _0 -- 15   Perceived Dyspnea (Exercise) _1 -- 2   Duration Progress to 30 minutes of  aerobic without signs/symptoms of physical distress Continue with 30 min of aerobic exercise without signs/symptoms of physical distress. Continue with 30 min of aerobic exercise without signs/symptoms of physical distress. -- Continue with 30 min of aerobic exercise without signs/symptoms of physical distress.   Intensity THRR unchanged THRR unchanged THRR unchanged -- THRR unchanged     Progression   Progression Continue to progress workloads to maintain intensity without signs/symptoms of physical distress. Continue to progress workloads to maintain intensity without signs/symptoms of physical distress. Continue to progress workloads to maintain intensity without signs/symptoms of physical distress. -- Continue to progress workloads to maintain intensity without  signs/symptoms of physical distress.     Resistance Training   Training Prescription Yes Yes Yes -- Yes   Weight red bands red bands Red bands -- Red bands   Reps 10-15 10-15 10-15 -- 10-15   Time 10 Minutes 10 Minutes 10 Minutes -- 10 Minutes     Oxygen   Oxygen Continuous Continuous  Continuous -- Continuous   Liters _0 -- 2     NuStep   Level _1 -- 4   SPM 70 70 70 -- 70   Minutes _2 -- 15   METs 1.6 1.7 1.9 -- 2.1     Arm Ergometer   Level _3 -- 2.5   Watts _4 -- 4   RPM 38 -- -- -- 36   Minutes _5 -- 15     Home Exercise Plan   Plans to continue exercise at -- -- -- Home (comment)  pedal bike --   Frequency -- -- -- Add 2 additional days to program exercise sessions. --   Initial Home Exercises Provided -- -- -- 12/11/21 --     Oxygen   Maintain Oxygen Saturation 88% or higher 88% or higher -- -- 88% or higher            Exercise Comments:   Exercise Comments     Row Name 11/11/21 1454 12/11/21 1526         Exercise Comments Pt completed first day of exercise. Benett exercised for 15 min on the Nustep and arm ergometer. He averaged 1.6 METs at level 1 on the Nustep and 38 rpms at level 1 on the arm ergometer. He performed the warmup and cooldown standing with verbal cues. Discussed METs as pt showed understanding. Discussed home exercise plan. Pt is currently exercising on a pedal bike 15-20 min 1 nonrehab day a week. Encouraged him to build up to 30 min, 1-3 nonrehab days. He is also practicing the strength training exercises he does in class 2 extra days. Pt receptive.               Exercise Goals and Review:   Exercise Goals     Row Name 11/03/21 1419 11/05/21 0903 12/01/21 1424 12/26/21 0952       Exercise Goals   Increase Physical Activity Yes Yes Yes Yes    Intervention Provide advice, education, support and counseling about physical activity/exercise needs.;Develop an individualized exercise prescription for aerobic and resistive training based on initial evaluation findings, risk stratification, comorbidities and participant's personal goals. Provide advice, education, support and counseling about physical activity/exercise needs.;Develop an individualized exercise prescription for aerobic and resistive  training based on initial evaluation findings, risk stratification, comorbidities and participant's personal goals. Provide advice, education, support and counseling about physical activity/exercise needs.;Develop an individualized exercise prescription for aerobic and resistive training based on initial evaluation findings, risk stratification, comorbidities and participant's personal goals. Provide advice, education, support and counseling about physical activity/exercise needs.;Develop an individualized exercise prescription for aerobic and resistive training based on initial evaluation findings, risk stratification, comorbidities and participant's personal goals.    Expected Outcomes Short Term: Attend rehab on a regular basis to increase amount of physical activity.;Long Term: Add in home exercise to make exercise part of routine and to increase amount of physical activity.;Long Term: Exercising regularly at least 3-5 days a week. Short Term: Attend rehab on a regular basis to increase amount of physical activity.;Long Term: Add in  home exercise to make exercise part of routine and to increase amount of physical activity.;Long Term: Exercising regularly at least 3-5 days a week. Short Term: Attend rehab on a regular basis to increase amount of physical activity.;Long Term: Add in home exercise to make exercise part of routine and to increase amount of physical activity.;Long Term: Exercising regularly at least 3-5 days a week. Short Term: Attend rehab on a regular basis to increase amount of physical activity.;Long Term: Add in home exercise to make exercise part of routine and to increase amount of physical activity.;Long Term: Exercising regularly at least 3-5 days a week.    Increase Strength and Stamina Yes Yes Yes Yes    Intervention Provide advice, education, support and counseling about physical activity/exercise needs.;Develop an individualized exercise prescription for aerobic and resistive training  based on initial evaluation findings, risk stratification, comorbidities and participant's personal goals. Provide advice, education, support and counseling about physical activity/exercise needs.;Develop an individualized exercise prescription for aerobic and resistive training based on initial evaluation findings, risk stratification, comorbidities and participant's personal goals. Provide advice, education, support and counseling about physical activity/exercise needs.;Develop an individualized exercise prescription for aerobic and resistive training based on initial evaluation findings, risk stratification, comorbidities and participant's personal goals. Provide advice, education, support and counseling about physical activity/exercise needs.;Develop an individualized exercise prescription for aerobic and resistive training based on initial evaluation findings, risk stratification, comorbidities and participant's personal goals.    Expected Outcomes Short Term: Increase workloads from initial exercise prescription for resistance, speed, and METs.;Short Term: Perform resistance training exercises routinely during rehab and add in resistance training at home;Long Term: Improve cardiorespiratory fitness, muscular endurance and strength as measured by increased METs and functional capacity (6MWT) Short Term: Increase workloads from initial exercise prescription for resistance, speed, and METs.;Short Term: Perform resistance training exercises routinely during rehab and add in resistance training at home;Long Term: Improve cardiorespiratory fitness, muscular endurance and strength as measured by increased METs and functional capacity (6MWT) Short Term: Increase workloads from initial exercise prescription for resistance, speed, and METs.;Short Term: Perform resistance training exercises routinely during rehab and add in resistance training at home;Long Term: Improve cardiorespiratory fitness, muscular endurance and  strength as measured by increased METs and functional capacity (6MWT) Short Term: Increase workloads from initial exercise prescription for resistance, speed, and METs.;Short Term: Perform resistance training exercises routinely during rehab and add in resistance training at home;Long Term: Improve cardiorespiratory fitness, muscular endurance and strength as measured by increased METs and functional capacity (6MWT)    Able to understand and use rate of perceived exertion (RPE) scale Yes Yes Yes Yes    Intervention Provide education and explanation on how to use RPE scale Provide education and explanation on how to use RPE scale Provide education and explanation on how to use RPE scale Provide education and explanation on how to use RPE scale    Expected Outcomes Short Term: Able to use RPE daily in rehab to express subjective intensity level;Long Term:  Able to use RPE to guide intensity level when exercising independently Short Term: Able to use RPE daily in rehab to express subjective intensity level;Long Term:  Able to use RPE to guide intensity level when exercising independently Short Term: Able to use RPE daily in rehab to express subjective intensity level;Long Term:  Able to use RPE to guide intensity level when exercising independently Short Term: Able to use RPE daily in rehab to express subjective intensity level;Long Term:  Able to use RPE  to guide intensity level when exercising independently    Able to understand and use Dyspnea scale Yes Yes Yes Yes    Intervention Provide education and explanation on how to use Dyspnea scale Provide education and explanation on how to use Dyspnea scale Provide education and explanation on how to use Dyspnea scale Provide education and explanation on how to use Dyspnea scale    Expected Outcomes Short Term: Able to use Dyspnea scale daily in rehab to express subjective sense of shortness of breath during exertion;Long Term: Able to use Dyspnea scale to guide  intensity level when exercising independently Short Term: Able to use Dyspnea scale daily in rehab to express subjective sense of shortness of breath during exertion;Long Term: Able to use Dyspnea scale to guide intensity level when exercising independently Short Term: Able to use Dyspnea scale daily in rehab to express subjective sense of shortness of breath during exertion;Long Term: Able to use Dyspnea scale to guide intensity level when exercising independently Short Term: Able to use Dyspnea scale daily in rehab to express subjective sense of shortness of breath during exertion;Long Term: Able to use Dyspnea scale to guide intensity level when exercising independently    Able to check pulse independently Yes Yes Yes Yes    Intervention Provide education and demonstration on how to check pulse in carotid and radial arteries.;Review the importance of being able to check your own pulse for safety during independent exercise Provide education and demonstration on how to check pulse in carotid and radial arteries.;Review the importance of being able to check your own pulse for safety during independent exercise Provide education and demonstration on how to check pulse in carotid and radial arteries.;Review the importance of being able to check your own pulse for safety during independent exercise Provide education and demonstration on how to check pulse in carotid and radial arteries.;Review the importance of being able to check your own pulse for safety during independent exercise    Expected Outcomes Short Term: Able to explain why pulse checking is important during independent exercise;Long Term: Able to check pulse independently and accurately Short Term: Able to explain why pulse checking is important during independent exercise;Long Term: Able to check pulse independently and accurately Short Term: Able to explain why pulse checking is important during independent exercise;Long Term: Able to check pulse  independently and accurately Short Term: Able to explain why pulse checking is important during independent exercise;Long Term: Able to check pulse independently and accurately    Understanding of Exercise Prescription Yes Yes Yes Yes    Intervention Provide education, explanation, and written materials on patient's individual exercise prescription Provide education, explanation, and written materials on patient's individual exercise prescription Provide education, explanation, and written materials on patient's individual exercise prescription Provide education, explanation, and written materials on patient's individual exercise prescription    Expected Outcomes Short Term: Able to explain program exercise prescription;Long Term: Able to explain home exercise prescription to exercise independently Short Term: Able to explain program exercise prescription;Long Term: Able to explain home exercise prescription to exercise independently Short Term: Able to explain program exercise prescription;Long Term: Able to explain home exercise prescription to exercise independently Short Term: Able to explain program exercise prescription;Long Term: Able to explain home exercise prescription to exercise independently             Exercise Goals Re-Evaluation :  Exercise Goals Re-Evaluation     Row Name 11/05/21 [phone] 12/01/21 1424 12/26/21 7341  Exercise Goal Re-Evaluation   Exercise Goals Review Increase Physical Activity;Increase Strength and Stamina;Able to understand and use rate of perceived exertion (RPE) scale;Able to understand and use Dyspnea scale;Knowledge and understanding of Target Heart Rate Range (THRR);Understanding of Exercise Prescription Increase Physical Activity;Increase Strength and Stamina;Able to understand and use rate of perceived exertion (RPE) scale;Able to understand and use Dyspnea scale;Knowledge and understanding of Target Heart Rate Range (THRR);Understanding of Exercise  Prescription Increase Physical Activity;Increase Strength and Stamina;Able to understand and use rate of perceived exertion (RPE) scale;Able to understand and use Dyspnea scale;Knowledge and understanding of Target Heart Rate Range (THRR);Understanding of Exercise Prescription     Comments Pt has not began exercise yet. Will monitor and progress as tolerated. Pt has completed 5 exercise sessions. He missed one session for illness. He is exercising on the nustep for 15 min, level 2, METs 1.7. He then is evercising on the arm ergometer for 15 min, level 1 at 1-2 watts. He is tolerating fairly well. He performs warm up and cool down standing with verbal and demonstrative cues.  Will monitor and progress as tolerated. Will discuss home exercise soon. Pt has completed 13 exercise sessions. He missed one session for illness. He is exercising on the nustep for 15 min, level 4, METs 2.1. He then is evercising on the arm ergometer for 15 min, level 3 at 6 watts. He is tolerating fairly well, making slow progressions. He performs warm up and cool down standing with verbal and demonstrative cues.  Will monitor and progress as tolerated.     Expected Outcomes Through exercise at rehab and home, the patient will decrease shortness of breath with daily activities and feel confident in carrying out an exercise regimen at home. Through exercise at rehab and home, the patient will decrease shortness of breath with daily activities and feel confident in carrying out an exercise regimen at home. Through exercise at rehab and home, the patient will decrease shortness of breath with daily activities and feel confident in carrying out an exercise regimen at home.              Discharge Exercise Prescription (Final Exercise Prescription Changes):  Exercise Prescription Changes - 12/23/21 1500       Response to Exercise   Blood Pressure (Admit) 138/68    Blood Pressure (Exercise) 134/64    Blood Pressure (Exit) 130/74     Heart Rate (Admit) 74 bpm    Heart Rate (Exercise) 86 bpm    Heart Rate (Exit) 81 bpm    Oxygen Saturation (Admit) 96 %    Oxygen Saturation (Exercise) 97 %    Oxygen Saturation (Exit) 99 %    Rating of Perceived Exertion (Exercise) 15    Perceived Dyspnea (Exercise) 2    Duration Continue with 30 min of aerobic exercise without signs/symptoms of physical distress.    Intensity THRR unchanged      Progression   Progression Continue to progress workloads to maintain intensity without signs/symptoms of physical distress.      Resistance Training   Training Prescription Yes    Weight Red bands    Reps 10-15    Time 10 Minutes      Oxygen   Oxygen Continuous    Liters 2      NuStep   Level 4    SPM 70    Minutes 15    METs 2.1      Arm Ergometer   Level 2.5    Pascal Lux  4    RPM 36    Minutes 15      Oxygen   Maintain Oxygen Saturation 88% or higher             Nutrition:  Target Goals: Understanding of nutrition guidelines, daily intake of sodium <1562m, cholesterol <2063m calories 30% from fat and 7% or less from saturated fats, daily to have 5 or more servings of fruits and vegetables.  Biometrics:  Pre Biometrics - 11/03/21 1108       Pre Biometrics   Grip Strength 24 kg              Nutrition Therapy Plan and Nutrition Goals:  Nutrition Therapy & Goals - 12/30/21 1458       Nutrition Therapy   Diet Heart Healthy/Carbohydrate Consistent diet    Drug/Food Interactions Statins/Certain Fruits      Personal Nutrition Goals   Nutrition Goal Patient to build a healthy plate daily with a variety of fruits, vegetables, whole grains, lean protein/plant protein, and nonfat dairy as part of heart healthy lifestyle    Personal Goal #2 Patient to limit to <150075mf sodium daily    Personal Goal #3 Patient to identify and limit daily intake of saturated fat, trans fat, sodium, and refined carbohydrates    Comments Goals in progress. AndWoods started to  reduce his sugary beverage consumption and switched to more water and diet soda. Discussed increasing non-starchy vegetables and reducing fried foods. AndJassonports that he does not know how to cook and does continue to eat out frequently. His children remain very supportive. He reports hx of PTSD limits some of his social interactions; [facility] is down ~1.3# since starting with our program.      Intervention Plan   Intervention Prescribe, educate and counsel regarding individualized specific dietary modifications aiming towards targeted core components such as weight, hypertension, lipid management, diabetes, heart failure and other comorbidities.    Expected Outcomes Short Term Goal: Understand basic principles of dietary content, such as calories, fat, sodium, cholesterol and nutrients.;Long Term Goal: Adherence to prescribed nutrition plan.             Nutrition Assessments:  Nutrition Assessments - 11/13/21 1426       Rate Your Plate Scores   Pre Score 46            MEDIFICTS Score Key: ?70 Need to make dietary changes  40-70 Heart Healthy Diet ? 40 Therapeutic Level Cholesterol Diet  Flowsheet Row PULMONARY REHAB CHRONIC OBSTRUCTIVE PULMONARY DISEASE [facility] [place] [facility] Total Score on Ad[facility] 46      Picture Your Plate Scores: <40<23healthy dietary pattern with much room for improvement. 41-50 Dietary pattern unlikely to meet recommendations for good health and room for improvement. 51-60 More healthful dietary pattern, with some room for improvement.  >60 Healthy dietary pattern, although there may be some specific behaviors that could be improved.    Nutrition Goals Re-Evaluation:  Nutrition Goals Re-Evaluation     RowCalienteme 11/11/21 1544 11/20/21 1513 12/02/21 1610 12/30/21 1458       Goals   Current Weight 199 lb 15.3 oz (90.7 kg) 199 lb 1.2 oz (90.3 kg)  191 lb 9.3 oz (86.9 kg) 198 lb 10.2 oz (90.1 kg)    Comment Most recent A1c was 6.7 He reports history of hyperparathyroidism that is monitored by PCP. He  is down ~8# since starting with our program. No new labs at this time.    Expected Outcome -- Codee reports choosing fast food most meals ([place], [employer]) and drinking soda and sweet tea daily. He lives at home with his daughter and three adult grandchildren. His other daughter does provide a hot meal 2x/week that includes non-starchy vegetables. Educated/discussed high sodium foods/intake and impact to heart and lungs. Goals in progress. Pacer has started to reduce his sugary beverage consumption and switched to water and diet soda to aid with blood sugar control. Discussed increasing non-starchy vegetables and reducing fried foods. Keedan reports that he does not know how to cook and does continue to eat out frequently. His children remain very supportive. Goals in progress. Corvin has started to reduce his sugary beverage consumption and switched to more water and diet soda. Discussed increasing non-starchy vegetables and reducing fried foods. Jden reports that he does not know how to cook and does continue to eat out frequently. His children remain very supportive. He reports hx of PTSD limits some of his social interactions; he does continue follow-up as needed with the [place]. He is down ~1.3# since starting with our program though weight has fluctuated throughout the last month.             Nutrition Goals Discharge (Final Nutrition Goals Re-Evaluation):  Nutrition Goals Re-Evaluation - 12/30/21 1458       Goals   Current Weight 198 lb 10.2 oz (90.1 kg)    Comment No new labs at this time.    Expected Outcome Goals in progress. Christine has started to reduce his sugary beverage consumption and switched to more water and diet soda. Discussed increasing non-starchy vegetables and reducing fried foods. Gentry reports that he does not know how  to cook and does continue to eat out frequently. His children remain very supportive. He reports hx of PTSD limits some of his social interactions; he does continue follow-up as needed with the [place]. He is down ~1.3# since starting with our program though weight has fluctuated throughout the last month.             Psychosocial: Target Goals: Acknowledge presence or absence of significant depression and/or stress, maximize coping skills, provide positive support system. Participant is able to verbalize types and ability to use techniques and skills needed for reducing stress and depression.  Initial Review & Psychosocial Screening:  Initial Psych Review & Screening - 11/03/21 1122       Initial Review   Current issues with History of Depression;Current Depression   Pt finished PTSD support group and is on medication for his depression.     Family Dynamics   Good Support System? Yes    Comments children      Barriers   Psychosocial barriers to participate in program The patient should benefit from training in stress management and relaxation.      Screening Interventions   Interventions Encouraged to exercise    Expected Outcomes Short Term goal: Identification and review with participant of any Quality of Life or Depression concerns found by scoring the questionnaire.;Long Term goal: The participant improves quality of Life and PHQ9 Scores as seen by post scores and/or verbalization of changes             Quality of Life Scores:  Scores of 19 and below usually indicate a poorer quality of life in these areas.  A difference of  2-3 points is a clinically meaningful difference.  A difference of 2-3 points in the total score of the Quality of Life Index has been associated with significant improvement in overall quality of life, self-image, physical symptoms, and general health in studies assessing change in quality of life.  PHQ-9: Review Flowsheet       11/03/2021  Depression  screen PHQ 2/9  Decreased Interest 0  Down, Depressed, Hopeless 1  PHQ - 2 Score 1  Altered sleeping 2  Tired, decreased energy 1  Change in appetite 0  Feeling bad or failure about yourself  0  Trouble concentrating 0  Moving slowly or fidgety/restless 0  Suicidal thoughts 0  PHQ-9 Score 4  Difficult doing work/chores Somewhat difficult   Interpretation of Total Score  Total Score Depression Severity:  1-4 = Minimal depression, 5-9 = Mild depression, 10-14 = Moderate depression, 15-19 = Moderately severe depression, 20-27 = Severe depression   Psychosocial Evaluation and Intervention:  Psychosocial Evaluation - 11/03/21 1125       Psychosocial Evaluation & Interventions   Interventions Stress management education;Relaxation education;Encouraged to exercise with the program and follow exercise prescription    Expected Outcomes For Cara to participate in Pulmonary Rehab    Continue Psychosocial Services  Follow up required by staff             Psychosocial Re-Evaluation:  Psychosocial Re-Evaluation     [place] Name 11/11/21 0906 12/03/21 1028 12/30/21 0919 12/30/21 2229       Psychosocial Re-Evaluation   Current issues with History of Depression;Current Depression Current Depression;History of Depression Current Depression;History of Depression Current Depression;History of Depression    Comments No change in psychosocial assessment since orientation 11/03/2021.  He is controlled with current therapy and medications, finished PTSD support group.  He will begin exercising today, 11/11/2021 in pulmonary rehab. Logon has been participating in the [facility] program without any psychosocial barriers. He states that he is enjoying the program and interactions with other class members. No signs of depression noted at this time. Will continue to monitor for any psychosocial needs. Detric has been participating in the [place] program without any psychosocial barriers. He is on medication for his  depression. He states that he has been enjoying the exercise sessions and interaction with others in hs class. Jauan has been participating in the [facility] without any psychosocial barriers. He states that he has been enjoying the exercise session and interaction with others in his class. He has been participating in the relaxtion during our cool down sessions.    Expected Outcomes For Normon to continue to be without barriers or psychosocial concerns while participating in pulmonary rehab. Will follow every 30 days for psychosocial assessment/changes. For him to attend the program without any psychosocial issues or concerns. -- For him to    Interventions Stress management education;Encouraged to attend Pulmonary Rehabilitation for the exercise;Relaxation education Relaxation education;Encouraged to attend Pulmonary Rehabilitation for the exercise Encouraged to attend Pulmonary Rehabilitation for the exercise Relaxation education;Encouraged to attend Pulmonary Rehabilitation for the exercise    Continue Psychosocial Services  Follow up required by staff No Follow up required -- No Follow up required             Psychosocial Discharge (Final Psychosocial Re-Evaluation):  Psychosocial Re-Evaluation - 12/30/21 7989       Psychosocial Re-Evaluation   Current issues with Current Depression;History of Depression    Comments Haig has been participating in the [facility] without any psychosocial barriers. He states that he has been enjoying the  exercise session and interaction with others in his class. He has been participating in the relaxtion during our cool down sessions.    Expected Outcomes For him to    Interventions Relaxation education;Encouraged to attend Pulmonary Rehabilitation for the exercise    Continue Psychosocial Services  No Follow up required             Education: Education Goals: Education classes will be provided on a weekly basis, covering required topics. Participant  will state understanding/return demonstration of topics presented.  Learning Barriers/Preferences:  Learning Barriers/Preferences - 11/03/21 1125       Learning Barriers/Preferences   Learning Barriers Sight   wears glasses   Learning Preferences Pictoral;Skilled Demonstration;Written Material             Education Topics: Introduction to Pulmonary Rehab Group instruction provided by PowerPoint, verbal discussion, and written material to support subject matter. Instructor reviews what Pulmonary Rehab is, the purpose of the program, and how patients are referred.     Know Your Numbers Group instruction that is supported by a PowerPoint presentation. Instructor discusses importance of knowing and understanding resting, exercise, and post-exercise oxygen saturation, heart rate, and blood pressure. Oxygen saturation, heart rate, blood pressure, rating of perceived exertion, and dyspnea are reviewed along with a normal range for these values.  Flowsheet Row PULMONARY REHAB CHRONIC OBSTRUCTIVE PULMONARY DISEASE from 12/25/2021 in [facility]  Date 12/25/21  Educator EP  Instruction Review Code 1- Verbalizes Understanding       Exercise for the Pulmonary Patient Group instruction that is supported by a PowerPoint presentation. Instructor discusses benefits of exercise, core components of exercise, frequency, duration, and intensity of an exercise routine, importance of utilizing pulse oximetry during exercise, safety while exercising, and options of places to exercise outside of rehab.       MET Level  Group instruction provided by PowerPoint, verbal discussion, and written material to support subject matter. Instructor reviews what METs are and how to increase METs.    Pulmonary Medications Verbally interactive group education provided by instructor with focus on inhaled medications and proper administration.   Anatomy and Physiology of the  Respiratory System Group instruction provided by PowerPoint, verbal discussion, and written material to support subject matter. Instructor reviews respiratory cycle and anatomical components of the respiratory system and their functions. Instructor also reviews differences in obstructive and restrictive respiratory diseases with examples of each.  Flowsheet Row PULMONARY REHAB CHRONIC OBSTRUCTIVE PULMONARY DISEASE from 12/25/2021 in [place]  Date 12/18/21  Educator EP  Instruction Review Code 1- Verbalizes Understanding       Oxygen Safety Group instruction provided by PowerPoint, verbal discussion, and written material to support subject matter. There is an overview of "What is Oxygen" and "Why do we need it".  Instructor also reviews how to create a safe environment for oxygen use, the importance of using oxygen as prescribed, and the risks of noncompliance. There is a brief discussion on traveling with oxygen and resources the patient may utilize. Flowsheet Row PULMONARY REHAB CHRONIC OBSTRUCTIVE PULMONARY DISEASE from 12/25/2021 in [place]  Date 11/13/21  Educator Kelle Darting  Instruction Review Code 2- Demonstrated Understanding       Oxygen Use Group instruction provided by PowerPoint, verbal discussion, and written material to discuss how supplemental oxygen is prescribed and different types of oxygen supply systems. Resources for more information are provided.  Flowsheet Row PULMONARY REHAB CHRONIC OBSTRUCTIVE  PULMONARY DISEASE from 12/25/2021 in [place]  Date 11/20/21  Educator Kelle Darting  Instruction Review Code 1- Verbalizes Understanding       Breathing Techniques Group instruction that is supported by demonstration and informational handouts. Instructor discusses the benefits of pursed lip and diaphragmatic breathing and detailed demonstration on how to perform both.   Flowsheet Row PULMONARY REHAB CHRONIC OBSTRUCTIVE PULMONARY DISEASE from 12/25/2021 in [place] [facility]  Date 11/27/21  Educator EP-Kristan  Instruction Review Code 1- Verbalizes Understanding        Risk Factor Reduction Group instruction that is supported by a PowerPoint presentation. Instructor discusses the definition of a risk factor, different risk factors for pulmonary disease, and how the heart and lungs work together. Flowsheet Row PULMONARY REHAB CHRONIC OBSTRUCTIVE PULMONARY DISEASE from 12/25/2021 in [place]  Date 12/04/21  Educator EP  Instruction Review Code 1- Verbalizes Understanding       MD Day A group question and answer session with a medical doctor that allows participants to ask questions that relate to their pulmonary disease state.   Nutrition for the Pulmonary Patient Group instruction provided by PowerPoint slides, verbal discussion, and written materials to support subject matter. The instructor gives an explanation and review of healthy diet recommendations, which includes a discussion on weight management, recommendations for fruit and vegetable consumption, as well as protein, fluid, caffeine, fiber, sodium, sugar, and alcohol. Tips for eating when patients are short of breath are discussed.    Other Education Group or individual verbal, written, or video instructions that support the educational goals of the pulmonary rehab program.    Knowledge Questionnaire Score:  Knowledge Questionnaire Score - 11/03/21 1142       Knowledge Questionnaire Score   Pre Score 15/18             Core Components/Risk Factors/Patient Goals at Admission:  Personal Goals and Risk Factors at Admission - 11/03/21 1125       Core Components/Risk Factors/Patient Goals on Admission    Weight Management Weight Loss;Yes    Intervention Weight Management: Develop a combined nutrition and exercise program  designed to reach desired caloric intake, while maintaining appropriate intake of nutrient and fiber, sodium and fats, and appropriate energy expenditure required for the weight goal.;Weight Management: Provide education and appropriate resources to help participant work on and attain dietary goals.;Weight Management/Obesity: Establish reasonable short term and long term weight goals.;Obesity: Provide education and appropriate resources to help participant work on and attain dietary goals.    Expected Outcomes Short Term: Continue to assess and modify interventions until short term weight is achieved;Long Term: Adherence to nutrition and physical activity/exercise program aimed toward attainment of established weight goal;Weight Maintenance: Understanding of the daily nutrition guidelines, which includes 25-35% calories from fat, 7% or less cal from saturated fats, less than 232m cholesterol, less than 1.5gm of sodium, & 5 or more servings of fruits and vegetables daily;Weight Loss: Understanding of general recommendations for a balanced deficit meal plan, which promotes 1-2 lb weight loss per week and includes a negative energy balance of [phone] kcal/d;Understanding recommendations for meals to include 15-35% energy as protein, 25-35% energy from fat, 35-60% energy from carbohydrates, less than 2083mof dietary cholesterol, 20-35 gm of total fiber daily;Understanding of distribution of calorie intake throughout the day with the consumption of 4-5 meals/snacks    Improve shortness of breath with ADL's Yes    Intervention Provide education, individualized  exercise plan and daily activity instruction to help decrease symptoms of SOB with activities of daily living.    Expected Outcomes Short Term: Improve cardiorespiratory fitness to achieve a reduction of symptoms when performing ADLs;Long Term: Be able to perform more ADLs without symptoms or delay the onset of symptoms             Core  Components/Risk Factors/Patient Goals Review:   Goals and Risk Factor Review     Row Name 11/11/21 0912 12/03/21 1033 12/30/21 0932         Core Components/Risk Factors/Patient Goals Review   Personal Goals Review Weight Management/Obesity;Increase knowledge of respiratory medications and ability to use respiratory devices properly.;Improve shortness of breath with ADL's;Develop more efficient breathing techniques such as purse lipped breathing and diaphragmatic breathing and practicing self-pacing with activity. Weight Management/Obesity;Improve shortness of breath with ADL's;Develop more efficient breathing techniques such as purse lipped breathing and diaphragmatic breathing and practicing self-pacing with activity.;Increase knowledge of respiratory medications and ability to use respiratory devices properly. Weight Management/Obesity;Improve shortness of breath with ADL's;Develop more efficient breathing techniques such as purse lipped breathing and diaphragmatic breathing and practicing self-pacing with activity.;Increase knowledge of respiratory medications and ability to use respiratory devices properly.     Review Geno will begin exercising in pulmonary rehab today, 11/11/2021.  At this time no goals have been met. Shermar has had some weight loss since starting the program. We will continue to monitor his weights. He is exercising on the nustep and arm ergometer. He is making some slow progress in increasing his workloads and METS. He is exercising on his prescribed oxygen of  2 liters. He is reporting mild to mild with difficulty SOB and saturations have been 96-100%.He has recieved oxygen use and safety education in class. Jaxen weight during his time at [facility] has decreased. He has had consulation with the dietician and is motivated to continue his weight lose journey.He is exercisng on the nustep and are ergometer. He has had increasing workloads and METS on both.HE reports to mild with difficulty  SOB with exercise. His oxygen saturations have been 95-99% on his 2 liters of oxygen with exercise.e has recieved education on oxygen use,safety,anatomy of the lungs and risk factors for lung disease.     Expected Outcomes See admission goals. See admission goals See admission goals              Core Components/Risk Factors/Patient Goals at Discharge (Final Review):   Goals and Risk Factor Review - 12/30/21 0932       Core Components/Risk Factors/Patient Goals Review   Personal Goals Review Weight Management/Obesity;Improve shortness of breath with ADL's;Develop more efficient breathing techniques such as purse lipped breathing and diaphragmatic breathing and practicing self-pacing with activity.;Increase knowledge of respiratory medications and ability to use respiratory devices properly.    Review Franko weight during his time at [facility] has decreased. He has had consulation with the dietician and is motivated to continue his weight lose journey.He is exercisng on the nustep and are ergometer. He has had increasing workloads and METS on both.HE reports to mild with difficulty SOB with exercise. His oxygen saturations have been 95-99% on his 2 liters of oxygen with exercise.e has recieved education on oxygen use,safety,anatomy of the lungs and risk factors for lung disease.    Expected Outcomes See admission goals             ITP Comments: ITP REVIEW Pt is making expected progress toward pulmonary rehab goals after  completing 14 sessions. Recommend continued exercise, life style modification, education, and utilization of breathing techniques to increase stamina and strength and decrease shortness of breath with exertion.   Comments: Dr. Rodman Pickle is Medical Director for Pulmonary Rehab at [facility].

## 2022-01-01 ENCOUNTER — Encounter (HOSPITAL_COMMUNITY)
Admission: RE | Admit: 2022-01-01 | Discharge: 2022-01-01 | Disposition: A | Discharge status: Home / Self Care | Payer: Medicare Other | Source: Ambulatory Visit | Attending: Internal Medicine | Admitting: Internal Medicine

## 2022-01-01 VITALS — Wt 197.5 lb

## 2022-01-01 DIAGNOSIS — J449 Chronic obstructive pulmonary disease, unspecified: Secondary | ICD-10-CM | POA: Diagnosis not present

## 2022-01-01 DIAGNOSIS — Z5189 Encounter for other specified aftercare: Secondary | ICD-10-CM | POA: Diagnosis not present

## 2022-01-01 NOTE — Progress Notes (Signed)
Daily Session Note  Patient Details  Name: Douglas Hansen MRN: [id number] Date of Birth: 01/05/1946 Referring Provider:   April Manson Pulmonary Rehab Walk Test from 11/03/2021 in [place]  Referring Provider Shearon Stalls       Encounter Date: 01/01/2022  Check In:  Session Check In - 01/01/22 1432       Check-In   Supervising physician immediately available to respond to emergencies [facility] - Physician supervision    Physician(s) Gala Murdoch    Location [facility]    Staff Present Rodney Langton, Cathleen Fears, MS, ACSM-CEP, Exercise Physiologist;Randi Olen Cordial BS, ACSM-CEP, Exercise Physiologist;David Lilyan Punt, MS, ACSM-CEP, CCRP, Exercise Physiologist;Jetta Walker BS, ACSM-CEP, Exercise Physiologist    Virtual Visit No    Medication changes reported     No    Fall or balance concerns reported    No    Tobacco Cessation No Change    Warm-up and Cool-down Performed as group-led instruction    Resistance Training Performed Yes    VAD Patient? No    PAD/SET Patient? No      Pain Assessment   Currently in Pain? No/denies    Pain Score 0-No pain    Multiple Pain Sites No             Capillary Blood Glucose: No results found for this or any previous visit (from the past 24 hour(s)).   Exercise Prescription Changes - 01/01/22 1500       Response to Exercise   Blood Pressure (Admit) 150/80    Blood Pressure (Exercise) 162/86    Blood Pressure (Exit) 138/82    Heart Rate (Admit) 82 bpm    Heart Rate (Exercise) 97 bpm    Heart Rate (Exit) 81 bpm    Oxygen Saturation (Admit) 95 %    Oxygen Saturation (Exercise) 98 %    Oxygen Saturation (Exit) 99 %    Rating of Perceived Exertion (Exercise) 13    Perceived Dyspnea (Exercise) 3    Duration Continue with 30 min of aerobic exercise without signs/symptoms of physical distress.    Intensity THRR unchanged      Progression   Progression Continue to progress workloads to maintain  intensity without signs/symptoms of physical distress.      Resistance Training   Training Prescription Yes    Weight Red bands    Reps 10-15    Time 10 Minutes      Oxygen   Oxygen Continuous    Liters 2      NuStep   Level 4    SPM 70    Minutes 15    METs 2.1      Arm Ergometer   Level 3    Watts 6    Minutes 15      Oxygen   Maintain Oxygen Saturation 88% or higher             Social History   Tobacco Use  Smoking Status Former   Packs/day: 1.00   Years: 20.00   Total pack years: 20.00   Types: Cigarettes   Quit date: 03/09/1985   Years since quitting: 36.8  Smokeless Tobacco Never    Goals Met:  Proper associated with RPD/PD & O2 Sat Exercise tolerated well No report of concerns or symptoms today Strength training completed today  Goals Unmet:  Not Applicable  Comments: Service time is from 1325 to 1450.    Dr. Rodman Pickle is Medical Director for Pulmonary Rehab  at [facility].

## 2022-01-06 ENCOUNTER — Encounter (HOSPITAL_COMMUNITY): Payer: Medicare Other

## 2022-01-08 ENCOUNTER — Encounter (HOSPITAL_COMMUNITY): Payer: Medicare Other

## 2022-01-08 ENCOUNTER — Telehealth (HOSPITAL_COMMUNITY): Payer: Self-pay | Admitting: Family Medicine

## 2022-01-13 ENCOUNTER — Encounter (HOSPITAL_COMMUNITY)
Admission: RE | Admit: 2022-01-13 | Discharge: 2022-01-13 | Disposition: A | Discharge status: Home / Self Care | Payer: Medicare Other | Source: Ambulatory Visit | Attending: Internal Medicine | Admitting: Internal Medicine

## 2022-01-13 VITALS — Wt 197.1 lb

## 2022-01-13 DIAGNOSIS — J449 Chronic obstructive pulmonary disease, unspecified: Secondary | ICD-10-CM

## 2022-01-13 NOTE — Progress Notes (Signed)
Daily Session Note  Patient Details  Name: Douglas Hansen MRN: [id number] Date of Birth: 01-25-1946 Referring Provider:   April Manson Pulmonary Rehab Walk Test from 11/03/2021 in [place]  Referring Provider Shearon Stalls       Encounter Date: 01/13/2022  Check In:  Session Check In - 01/13/22 1504       Check-In   Supervising physician immediately available to respond to emergencies [facility] [place] - Physician supervision    Physician(s) Dr. Erskine Emery    Location [facility]    Staff Present Elmon Else, MS, ACSM-CEP, Exercise Physiologist;David Lilyan Punt, MS, ACSM-CEP, CCRP, Exercise Physiologist;Lisa Ysidro Evert, RN    Virtual Visit No    Medication changes reported     No    Fall or balance concerns reported    No    Tobacco Cessation No Change    Warm-up and Cool-down Performed as group-led instruction    Resistance Training Performed Yes    VAD Patient? No    PAD/SET Patient? No      Pain Assessment   Currently in Pain? No/denies    Multiple Pain Sites No             Capillary Blood Glucose: No results found for this or any previous visit (from the past 24 hour(s)).   Exercise Prescription Changes - 01/13/22 1600       Response to Exercise   Blood Pressure (Admit) 132/64    Blood Pressure (Exercise) 158/78    Blood Pressure (Exit) 140/72    Heart Rate (Admit) 85 bpm    Heart Rate (Exercise) 110 bpm    Heart Rate (Exit) 97 bpm    Oxygen Saturation (Admit) 96 %    Oxygen Saturation (Exercise) 98 %    Oxygen Saturation (Exit) 97 %    Rating of Perceived Exertion (Exercise) 13    Perceived Dyspnea (Exercise) 2    Duration Continue with 30 min of aerobic exercise without signs/symptoms of physical distress.    Intensity THRR unchanged      Progression   Progression Continue to progress workloads to maintain intensity without signs/symptoms of physical distress.      Resistance Training   Training Prescription Yes    Weight  Red bands    Reps 10-15    Time 10 Minutes      Oxygen   Oxygen Continuous    Liters 2      NuStep   Level 4    SPM 80    Minutes 15    METs 2.3      Arm Ergometer   Level 3    Watts 5    RPM 34    Minutes 15      Oxygen   Maintain Oxygen Saturation 88% or higher             Social History   Tobacco Use  Smoking Status Former   Packs/day: 1.00   Years: 20.00   Total pack years: 20.00   Types: Cigarettes   Quit date: 03/09/1985   Years since quitting: 36.8  Smokeless Tobacco Never    Goals Met:  Proper associated with RPD/PD & O2 Sat Exercise tolerated well No report of concerns or symptoms today Strength training completed today  Goals Unmet:  Not Applicable  Comments: Service time is from 1315 to 1435.   Dr. Rodman Pickle is Medical Director for Pulmonary Rehab at [facility].

## 2022-01-15 ENCOUNTER — Encounter (HOSPITAL_COMMUNITY): Admission: RE | Admit: 2022-01-15 | Payer: Medicare Other | Source: Ambulatory Visit

## 2022-01-19 ENCOUNTER — Telehealth (HOSPITAL_COMMUNITY): Payer: Self-pay | Admitting: *Deleted

## 2022-01-19 NOTE — Telephone Encounter (Signed)
Pt was unable to complete his 6 min walk test due to an unforeseen appointment last week. Called pt to see if he would like to come in to do test. Pt declines. Will discharge pt from [place] BS, ACSM-CEP 01/19/2022 10:15 AM

## 2022-01-20 NOTE — Progress Notes (Signed)
Discharge Progress Report  Patient Details  Name: Douglas Hansen MRN: [id number] Date of Birth: 1946/02/09 Referring Provider:   April Manson Pulmonary Rehab Walk Test from 11/03/2021 in [facility] [place]  Referring Provider Desai        Number of Visits: 16  Reason for Discharge:  Patient has met program and personal goals. Yadir called and stated that he did not want to come in for his final 6 minute walk. test  Smoking History:  Social History   Tobacco Use  Smoking Status Former   Packs/day: 1.00   Years: 20.00   Total pack years: 20.00   Types: Cigarettes   Quit date: 03/09/1985   Years since quitting: 36.8  Smokeless Tobacco Never    Diagnosis:  Stage 3 severe COPD by GOLD classification ([place])  ADL [facility]:  Pulmonary Assessment Scores     Row Name 11/03/21 1130 01/19/22 1421       ADL [facility]   ADL Phase Entry Exit    SOB Score total 24 76      CAT Score   CAT Score 20 30      mMRC Score   mMRC Score 4 --             Initial Exercise Prescription:  Initial Exercise Prescription - 11/03/21 1400       Date of Initial Exercise RX and Referring Provider   Date 11/03/21    Referring Provider Shearon Stalls    Expected Discharge Date 01/13/22      NuStep   Level 1    SPM 70    Minutes 15      Arm Ergometer   Level 1    Watts 1    RPM 60    Minutes 15      Prescription Details   Frequency (times per week) 2    Duration Progress to 30 minutes of continuous aerobic without signs/symptoms of physical distress      Intensity   THRR 40-80% of Max Heartrate 57-114    Ratings of Perceived Exertion 11-13    Perceived Dyspnea 0-4      Progression   Progression Continue to progress workloads to maintain intensity without signs/symptoms of physical distress.      Resistance Training   Training Prescription Yes    Weight red    Reps 10-15             Discharge Exercise Prescription (Final  Exercise Prescription Changes):  Exercise Prescription Changes - 01/13/22 1600       Response to Exercise   Blood Pressure (Admit) 132/64    Blood Pressure (Exercise) 158/78    Blood Pressure (Exit) 140/72    Heart Rate (Admit) 85 bpm    Heart Rate (Exercise) 110 bpm    Heart Rate (Exit) 97 bpm    Oxygen Saturation (Admit) 96 %    Oxygen Saturation (Exercise) 98 %    Oxygen Saturation (Exit) 97 %    Rating of Perceived Exertion (Exercise) 13    Perceived Dyspnea (Exercise) 2    Duration Continue with 30 min of aerobic exercise without signs/symptoms of physical distress.    Intensity THRR unchanged      Progression   Progression Continue to progress workloads to maintain intensity without signs/symptoms of physical distress.      Resistance Training   Training Prescription Yes    Weight Red bands    Reps 10-15  Time 10 Minutes      Oxygen   Oxygen Continuous    Liters 2      NuStep   Level 4    SPM 80    Minutes 15    METs 2.3      Arm Ergometer   Level 3    Watts 5    RPM 34    Minutes 15      Oxygen   Maintain Oxygen Saturation 88% or higher             Functional Capacity:  6 Minute Walk     Row Name 11/03/21 1253         6 Minute Walk   Phase Initial     Distance 744 feet     Walk Time 6 minutes     # of Rest Breaks 0     MPH 1.41     METS 1.2     RPE 12     Perceived Dyspnea  2     VO2 Peak 4.19     Symptoms No     Resting HR 80 bpm     Resting BP 124/62     Resting Oxygen Saturation  99 %     Exercise Oxygen Saturation  during 6 min walk 94 %     Max Ex. HR 90 bpm     Max Ex. BP 140/64     2 Minute Post BP 120/64       Interval HR   1 Minute HR 80     2 Minute HR 79     3 Minute HR 83     4 Minute HR 90     5 Minute HR 89     6 Minute HR 89     2 Minute Post HR 74     Interval Heart Rate? Yes       Interval Oxygen   Interval Oxygen? Yes     Baseline Oxygen Saturation % 99 %     1 Minute Oxygen Saturation % 100 %      1 Minute Liters of Oxygen 2 L     2 Minute Oxygen Saturation % 99 %     2 Minute Liters of Oxygen 2 L     3 Minute Oxygen Saturation % 98 %     3 Minute Liters of Oxygen 2 L     4 Minute Oxygen Saturation % 97 %     4 Minute Liters of Oxygen 2 L     5 Minute Oxygen Saturation % 94 %     5 Minute Liters of Oxygen 2 L     6 Minute Oxygen Saturation % 96 %     6 Minute Liters of Oxygen 2 L     2 Minute Post Oxygen Saturation % 100 %     2 Minute Post Liters of Oxygen 2 L              Psychological, QOL, Others - Outcomes: PHQ 2/9:    01/19/2022    3:14 PM 11/03/2021   11:19 AM  Depression screen PHQ 2/9  Decreased Interest 3 0  Down, Depressed, Hopeless 3 1  PHQ - 2 Score 6 1  Altered sleeping 3 2  Tired, decreased energy 3 1  Change in appetite 1 0  Feeling bad or failure about yourself  3 0  Trouble concentrating 3 0  Moving slowly or fidgety/restless 1 0  Suicidal thoughts 0 0  PHQ-9 Score 20 4  Difficult doing work/chores Somewhat difficult Somewhat difficult    Quality of Life:   Personal Goals: Goals established at orientation with interventions provided to work toward goal.  Personal Goals and Risk Factors at Admission - 11/03/21 1125       Core Components/Risk Factors/Patient Goals on Admission    Weight Management Weight Loss;Yes    Intervention Weight Management: Develop a combined nutrition and exercise program designed to reach desired caloric intake, while maintaining appropriate intake of nutrient and fiber, sodium and fats, and appropriate energy expenditure required for the weight goal.;Weight Management: Provide education and appropriate resources to help participant work on and attain dietary goals.;Weight Management/Obesity: Establish reasonable short term and long term weight goals.;Obesity: Provide education and appropriate resources to help participant work on and attain dietary goals.    Expected Outcomes Short Term: Continue to assess and modify  interventions until short term weight is achieved;Long Term: Adherence to nutrition and physical activity/exercise program aimed toward attainment of established weight goal;Weight Maintenance: Understanding of the daily nutrition guidelines, which includes 25-35% calories from fat, 7% or less cal from saturated fats, less than 273m cholesterol, less than 1.5gm of sodium, & 5 or more servings of fruits and vegetables daily;Weight Loss: Understanding of general recommendations for a balanced deficit meal plan, which promotes 1-2 lb weight loss per week and includes a negative energy balance of [phone] kcal/d;Understanding recommendations for meals to include 15-35% energy as protein, 25-35% energy from fat, 35-60% energy from carbohydrates, less than 2074mof dietary cholesterol, 20-35 gm of total fiber daily;Understanding of distribution of calorie intake throughout the day with the consumption of 4-5 meals/snacks    Improve shortness of breath with ADL's Yes    Intervention Provide education, individualized exercise plan and daily activity instruction to help decrease symptoms of SOB with activities of daily living.    Expected Outcomes Short Term: Improve cardiorespiratory fitness to achieve a reduction of symptoms when performing ADLs;Long Term: Be able to perform more ADLs without symptoms or delay the onset of symptoms              Personal Goals Discharge:  Goals and Risk Factor Review     Row Name 11/11/21 0912 12/03/21 1033 12/30/21 0932         Core Components/Risk Factors/Patient Goals Review   Personal Goals Review Weight Management/Obesity;Increase knowledge of respiratory medications and ability to use respiratory devices properly.;Improve shortness of breath with ADL's;Develop more efficient breathing techniques such as purse lipped breathing and diaphragmatic breathing and practicing self-pacing with activity. Weight Management/Obesity;Improve shortness of breath with  ADL's;Develop more efficient breathing techniques such as purse lipped breathing and diaphragmatic breathing and practicing self-pacing with activity.;Increase knowledge of respiratory medications and ability to use respiratory devices properly. Weight Management/Obesity;Improve shortness of breath with ADL's;Develop more efficient breathing techniques such as purse lipped breathing and diaphragmatic breathing and practicing self-pacing with activity.;Increase knowledge of respiratory medications and ability to use respiratory devices properly.     Review AnLadainianill begin exercising in pulmonary rehab today, 11/11/2021.  At this time no goals have been met. AnWaldemaras had some weight loss since starting the program. We will continue to monitor his weights. He is exercising on the nustep and arm ergometer. He is making some slow progress in increasing his workloads and METS. He is exercising on his prescribed oxygen of  2 liters. He is reporting mild to mild with difficulty SOB and saturations  have been 96-100%.He has recieved oxygen use and safety education in class. Jahmier weight during his time at [facility] has decreased. He has had consulation with the dietician and is motivated to continue his weight lose journey.He is exercisng on the nustep and are ergometer. He has had increasing workloads and METS on both.HE reports to mild with difficulty SOB with exercise. His oxygen saturations have been 95-99% on his 2 liters of oxygen with exercise.e has recieved education on oxygen use,safety,anatomy of the lungs and risk factors for lung disease.     Expected Outcomes See admission goals. See admission goals See admission goals              Exercise Goals and Review:  Exercise Goals     Row Name 11/03/21 1419 11/05/21 0903 12/01/21 1424 12/26/21 0952       Exercise Goals   Increase Physical Activity Yes Yes Yes Yes    Intervention Provide advice, education, support and counseling about physical  activity/exercise needs.;Develop an individualized exercise prescription for aerobic and resistive training based on initial evaluation findings, risk stratification, comorbidities and participant's personal goals. Provide advice, education, support and counseling about physical activity/exercise needs.;Develop an individualized exercise prescription for aerobic and resistive training based on initial evaluation findings, risk stratification, comorbidities and participant's personal goals. Provide advice, education, support and counseling about physical activity/exercise needs.;Develop an individualized exercise prescription for aerobic and resistive training based on initial evaluation findings, risk stratification, comorbidities and participant's personal goals. Provide advice, education, support and counseling about physical activity/exercise needs.;Develop an individualized exercise prescription for aerobic and resistive training based on initial evaluation findings, risk stratification, comorbidities and participant's personal goals.    Expected Outcomes Short Term: Attend rehab on a regular basis to increase amount of physical activity.;Long Term: Add in home exercise to make exercise part of routine and to increase amount of physical activity.;Long Term: Exercising regularly at least 3-5 days a week. Short Term: Attend rehab on a regular basis to increase amount of physical activity.;Long Term: Add in home exercise to make exercise part of routine and to increase amount of physical activity.;Long Term: Exercising regularly at least 3-5 days a week. Short Term: Attend rehab on a regular basis to increase amount of physical activity.;Long Term: Add in home exercise to make exercise part of routine and to increase amount of physical activity.;Long Term: Exercising regularly at least 3-5 days a week. Short Term: Attend rehab on a regular basis to increase amount of physical activity.;Long Term: Add in home  exercise to make exercise part of routine and to increase amount of physical activity.;Long Term: Exercising regularly at least 3-5 days a week.    Increase Strength and Stamina Yes Yes Yes Yes    Intervention Provide advice, education, support and counseling about physical activity/exercise needs.;Develop an individualized exercise prescription for aerobic and resistive training based on initial evaluation findings, risk stratification, comorbidities and participant's personal goals. Provide advice, education, support and counseling about physical activity/exercise needs.;Develop an individualized exercise prescription for aerobic and resistive training based on initial evaluation findings, risk stratification, comorbidities and participant's personal goals. Provide advice, education, support and counseling about physical activity/exercise needs.;Develop an individualized exercise prescription for aerobic and resistive training based on initial evaluation findings, risk stratification, comorbidities and participant's personal goals. Provide advice, education, support and counseling about physical activity/exercise needs.;Develop an individualized exercise prescription for aerobic and resistive training based on initial evaluation findings, risk stratification, comorbidities and participant's personal goals.    Expected Outcomes  Short Term: Increase workloads from initial exercise prescription for resistance, speed, and METs.;Short Term: Perform resistance training exercises routinely during rehab and add in resistance training at home;Long Term: Improve cardiorespiratory fitness, muscular endurance and strength as measured by increased METs and functional capacity (6MWT) Short Term: Increase workloads from initial exercise prescription for resistance, speed, and METs.;Short Term: Perform resistance training exercises routinely during rehab and add in resistance training at home;Long Term: Improve  cardiorespiratory fitness, muscular endurance and strength as measured by increased METs and functional capacity (6MWT) Short Term: Increase workloads from initial exercise prescription for resistance, speed, and METs.;Short Term: Perform resistance training exercises routinely during rehab and add in resistance training at home;Long Term: Improve cardiorespiratory fitness, muscular endurance and strength as measured by increased METs and functional capacity (6MWT) Short Term: Increase workloads from initial exercise prescription for resistance, speed, and METs.;Short Term: Perform resistance training exercises routinely during rehab and add in resistance training at home;Long Term: Improve cardiorespiratory fitness, muscular endurance and strength as measured by increased METs and functional capacity (6MWT)    Able to understand and use rate of perceived exertion (RPE) scale Yes Yes Yes Yes    Intervention Provide education and explanation on how to use RPE scale Provide education and explanation on how to use RPE scale Provide education and explanation on how to use RPE scale Provide education and explanation on how to use RPE scale    Expected Outcomes Short Term: Able to use RPE daily in rehab to express subjective intensity level;Long Term:  Able to use RPE to guide intensity level when exercising independently Short Term: Able to use RPE daily in rehab to express subjective intensity level;Long Term:  Able to use RPE to guide intensity level when exercising independently Short Term: Able to use RPE daily in rehab to express subjective intensity level;Long Term:  Able to use RPE to guide intensity level when exercising independently Short Term: Able to use RPE daily in rehab to express subjective intensity level;Long Term:  Able to use RPE to guide intensity level when exercising independently    Able to understand and use Dyspnea scale Yes Yes Yes Yes    Intervention Provide education and explanation on  how to use Dyspnea scale Provide education and explanation on how to use Dyspnea scale Provide education and explanation on how to use Dyspnea scale Provide education and explanation on how to use Dyspnea scale    Expected Outcomes Short Term: Able to use Dyspnea scale daily in rehab to express subjective sense of shortness of breath during exertion;Long Term: Able to use Dyspnea scale to guide intensity level when exercising independently Short Term: Able to use Dyspnea scale daily in rehab to express subjective sense of shortness of breath during exertion;Long Term: Able to use Dyspnea scale to guide intensity level when exercising independently Short Term: Able to use Dyspnea scale daily in rehab to express subjective sense of shortness of breath during exertion;Long Term: Able to use Dyspnea scale to guide intensity level when exercising independently Short Term: Able to use Dyspnea scale daily in rehab to express subjective sense of shortness of breath during exertion;Long Term: Able to use Dyspnea scale to guide intensity level when exercising independently    Able to check pulse independently Yes Yes Yes Yes    Intervention Provide education and demonstration on how to check pulse in carotid and radial arteries.;Review the importance of being able to check your own pulse for safety during independent exercise Provide education and  demonstration on how to check pulse in carotid and radial arteries.;Review the importance of being able to check your own pulse for safety during independent exercise Provide education and demonstration on how to check pulse in carotid and radial arteries.;Review the importance of being able to check your own pulse for safety during independent exercise Provide education and demonstration on how to check pulse in carotid and radial arteries.;Review the importance of being able to check your own pulse for safety during independent exercise    Expected Outcomes Short Term: Able to  explain why pulse checking is important during independent exercise;Long Term: Able to check pulse independently and accurately Short Term: Able to explain why pulse checking is important during independent exercise;Long Term: Able to check pulse independently and accurately Short Term: Able to explain why pulse checking is important during independent exercise;Long Term: Able to check pulse independently and accurately Short Term: Able to explain why pulse checking is important during independent exercise;Long Term: Able to check pulse independently and accurately    Understanding of Exercise Prescription Yes Yes Yes Yes    Intervention Provide education, explanation, and written materials on patient's individual exercise prescription Provide education, explanation, and written materials on patient's individual exercise prescription Provide education, explanation, and written materials on patient's individual exercise prescription Provide education, explanation, and written materials on patient's individual exercise prescription    Expected Outcomes Short Term: Able to explain program exercise prescription;Long Term: Able to explain home exercise prescription to exercise independently Short Term: Able to explain program exercise prescription;Long Term: Able to explain home exercise prescription to exercise independently Short Term: Able to explain program exercise prescription;Long Term: Able to explain home exercise prescription to exercise independently Short Term: Able to explain program exercise prescription;Long Term: Able to explain home exercise prescription to exercise independently             Exercise Goals Re-Evaluation:  Exercise Goals Re-Evaluation     Row Name 11/05/21 0904 12/01/21 1424 12/26/21 0952         Exercise Goal Re-Evaluation   Exercise Goals Review Increase Physical Activity;Increase Strength and Stamina;Able to understand and use rate of perceived exertion (RPE)  scale;Able to understand and use Dyspnea scale;Knowledge and understanding of Target Heart Rate Range (THRR);Understanding of Exercise Prescription Increase Physical Activity;Increase Strength and Stamina;Able to understand and use rate of perceived exertion (RPE) scale;Able to understand and use Dyspnea scale;Knowledge and understanding of Target Heart Rate Range (THRR);Understanding of Exercise Prescription Increase Physical Activity;Increase Strength and Stamina;Able to understand and use rate of perceived exertion (RPE) scale;Able to understand and use Dyspnea scale;Knowledge and understanding of Target Heart Rate Range (THRR);Understanding of Exercise Prescription     Comments Pt has not began exercise yet. Will monitor and progress as tolerated. Pt has completed 5 exercise sessions. He missed one session for illness. He is exercising on the nustep for 15 min, level 2, METs 1.7. He then is evercising on the arm ergometer for 15 min, level 1 at 1-2 watts. He is tolerating fairly well. He performs warm up and cool down standing with verbal and demonstrative cues.  Will monitor and progress as tolerated. Will discuss home exercise soon. Pt has completed 13 exercise sessions. He missed one session for illness. He is exercising on the nustep for 15 min, level 4, METs 2.1. He then is evercising on the arm ergometer for 15 min, level 3 at 6 watts. He is tolerating fairly well, making slow progressions. He performs warm up and cool  down standing with verbal and demonstrative cues.  Will monitor and progress as tolerated.     Expected Outcomes Through exercise at rehab and home, the patient will decrease shortness of breath with daily activities and feel confident in carrying out an exercise regimen at home. Through exercise at rehab and home, the patient will decrease shortness of breath with daily activities and feel confident in carrying out an exercise regimen at home. Through exercise at rehab and home, the  patient will decrease shortness of breath with daily activities and feel confident in carrying out an exercise regimen at home.              Nutrition & Weight - Outcomes:  Pre Biometrics - 11/03/21 1108       Pre Biometrics   Grip Strength 24 kg              Nutrition:  Nutrition Therapy & Goals - 12/30/21 1458       Nutrition Therapy   Diet Heart Healthy/Carbohydrate Consistent diet    Drug/Food Interactions Statins/Certain Fruits      Personal Nutrition Goals   Nutrition Goal Patient to build a healthy plate daily with a variety of fruits, vegetables, whole grains, lean protein/plant protein, and nonfat dairy as part of heart healthy lifestyle    Personal Goal #2 Patient to limit to <1563m of sodium daily    Personal Goal #3 Patient to identify and limit daily intake of saturated fat, trans fat, sodium, and refined carbohydrates    Comments Goals in progress. AKarterhas started to reduce his sugary beverage consumption and switched to more water and diet soda. Discussed increasing non-starchy vegetables and reducing fried foods. AAhmanreports that he does not know how to cook and does continue to eat out frequently. His children remain very supportive. He reports hx of PTSD limits some of his social interactions; [place] He is down ~1.3# since starting with our program.      Intervention Plan   Intervention Prescribe, educate and counsel regarding individualized specific dietary modifications aiming towards targeted core components such as weight, hypertension, lipid management, diabetes, heart failure and other comorbidities.    Expected Outcomes Short Term Goal: Understand basic principles of dietary content, such as calories, fat, sodium, cholesterol and nutrients.;Long Term Goal: Adherence to prescribed nutrition plan.             Nutrition Discharge:  Nutrition Assessments - 11/13/21 1426       Rate Your Plate  Scores   Pre Score 46             Education Questionnaire Score:  Knowledge Questionnaire Score - 01/19/22 1422       Knowledge Questionnaire Score   Post Score 18/18             Goals reviewed with patient; copy given to patient.

## 2022-03-08 ENCOUNTER — Ambulatory Visit
Admission: EM | Admit: 2022-03-08 | Discharge: 2022-03-08 | Disposition: A | Discharge status: Home / Self Care | Payer: Medicare Other | Attending: Internal Medicine | Admitting: Internal Medicine

## 2022-03-08 ENCOUNTER — Other Ambulatory Visit: Payer: Self-pay

## 2022-03-08 ENCOUNTER — Encounter: Payer: Self-pay | Admitting: Emergency Medicine

## 2022-03-08 DIAGNOSIS — E785 Hyperlipidemia, unspecified: Secondary | ICD-10-CM | POA: Diagnosis not present

## 2022-03-08 DIAGNOSIS — Z8616 Personal history of COVID-19: Secondary | ICD-10-CM | POA: Diagnosis not present

## 2022-03-08 DIAGNOSIS — E118 Type 2 diabetes mellitus with unspecified complications: Secondary | ICD-10-CM | POA: Diagnosis not present

## 2022-03-08 DIAGNOSIS — E559 Vitamin D deficiency, unspecified: Secondary | ICD-10-CM | POA: Diagnosis not present

## 2022-03-08 DIAGNOSIS — J449 Chronic obstructive pulmonary disease, unspecified: Secondary | ICD-10-CM | POA: Diagnosis not present

## 2022-03-08 DIAGNOSIS — J069 Acute upper respiratory infection, unspecified: Secondary | ICD-10-CM | POA: Diagnosis not present

## 2022-03-08 DIAGNOSIS — I10 Essential (primary) hypertension: Secondary | ICD-10-CM | POA: Diagnosis not present

## 2022-03-08 DIAGNOSIS — Z1152 Encounter for screening for COVID-19: Secondary | ICD-10-CM | POA: Insufficient documentation

## 2022-03-08 DIAGNOSIS — Z87891 Personal history of nicotine dependence: Secondary | ICD-10-CM | POA: Diagnosis not present

## 2022-03-08 DIAGNOSIS — R059 Cough, unspecified: Secondary | ICD-10-CM | POA: Diagnosis present

## 2022-03-08 LAB — POCT INFLUENZA A/B
Influenza A, POC: NEGATIVE
Influenza B, POC: NEGATIVE

## 2022-03-08 MED ORDER — BENZONATATE 100 MG PO CAPS: 100.0000 mg | ORAL_CAPSULE | Freq: Three times a day (TID) | ORAL | 0 refills | Status: DC | PRN

## 2022-03-08 NOTE — Discharge Instructions (Addendum)
Rapid flu was negative.  COVID test is pending.  Will call if it is positive.  It appears that you have a viral illness causing your symptoms.  Please monitor your oxygen very closely at home and go to the hospital if it is consistently in the low 90s or below or if shortness of breath increases.  It appears that you already have been prescribed Flonase so this should be helpful as well.

## 2022-03-08 NOTE — ED Provider Notes (Addendum)
Douglas Hansen    CSN: [id number] Arrival date & time: 03/08/22  0850      History   Chief Complaint Chief Complaint  Patient presents with   Cough    HPI Tirrell Buchberger is a 76 y.o. male.   Patient presents with cough and nasal congestion that has been present for 2 days.  Patient denies any known fevers or sick contacts.  Does have a history of significant asthma and COPD so he wears 2 L of oxygen consistently.  Reports that his oxygen is typically around 95% with home O2 at home.  He has been checking his oxygen while being sick and reports that he has been getting 95 to 97%.  He reports that he has shortness of breath at baseline and "thinks that he has been slightly more short of breath" since being sick but is not sure.  Denies chest pain, sore throat, ear pain, nausea, vomiting, diarrhea, abdominal pain.  He has not taken any medications to help alleviate symptoms.  He does have an albuterol inhaler that he has been using as needed with improvement in symptoms.   Cough   Past Medical History:  Diagnosis Date   Agent orange exposure    Arthritis    Asthma    Asthma with acute exacerbation 06/17/2015   COPD (chronic obstructive pulmonary disease) ([place])    Diabetes mellitus    Hyperlipemia    Hypertension    OSA on CPAP    Pneumonia    Pneumonia due to COVID-19 virus 11/22/2019   PTSD (post-traumatic stress disorder)     Patient Active Problem List   Diagnosis Date Noted   COPD with acute exacerbation ([place]) 07/14/2021   Hyperkalemia 07/14/2021   Acute on chronic respiratory failure with hypoxia ([place]) 07/13/2021   Acute respiratory failure with hypoxia ([place]) 07/12/2021   Chest pain 01/02/2020   Hypoxia 11/22/2019   Dyslipidemia 06/03/2016   Other fatigue 06/03/2016   Claudication ([place]) 06/03/2016   Gastroesophageal reflux disease without esophagitis 06/17/2015   Acute pulmonary edema ([place]) 04/17/2015   Chronic obstructive pulmonary disease, unspecified  copd, unspecified chronic bronchitis type 01/07/2015   Moderate persistent asthma 01/07/2015   Seasonal and perennial allergic rhinitis 01/07/2015   Asthma-COPD overlap syndrome     Class: Chronic   Pneumonia due to other gram-negative bacteria 01/30/2011   Cough 06/09/2010   COPD (chronic obstructive pulmonary disease) ([place]) 06/09/2010   OSA on CPAP    Diabetes mellitus ([place])    Hyperlipemia    Essential hypertension     Past Surgical History:  Procedure Laterality Date   CARDIAC CATHETERIZATION     right thumb surgery     TONSILLECTOMY         Home Medications    Prior to Admission medications   Medication Sig Start Date End Date Taking? Authorizing Provider  benzonatate (TESSALON) 100 MG capsule Take 1 capsule (100 mg total) by mouth every 8 (eight) hours as needed for cough. 03/08/22  Yes Timmie Calix, Hildred Alamin E, FNP  albuterol (PROVENTIL) (2.5 MG/3ML) 0.083% nebulizer solution Take 3 mLs (2.5 mg total) by nebulization every 4 (four) hours as needed for wheezing or shortness of breath (coughing fits). 04/14/21   Garnet Sierras, DO  albuterol (VENTOLIN HFA) 108 (90 Base) MCG/ACT inhaler INHALE 2 PUFFS BY MOUTH EVERY 4 HOURS AS NEEDED FOR WHEEZING OR SHORTNESS OF BREATH Patient taking differently: Inhale 2 puffs into the lungs every 4 (four) hours as needed for wheezing or shortness  of breath. 08/22/19   Garnet Sierras, DO  amLODipine (NORVASC) 10 MG tablet Take 10 mg by mouth daily. 04/06/19   [provider]  ascorbic acid (VITAMIN C) 500 MG tablet Take 1,000 mg by mouth daily.  04/06/19   [provider]  aspirin EC 81 MG EC tablet Take 1 tablet (81 mg total) by mouth daily. 07/17/21   [facility], Tami Lin, PA  azelastine (ASTELIN) 0.1 % nasal spray Place 1-2 sprays into both nostrils 2 (two) times daily as needed (nasal drainage). Use in each nostril as directed Patient taking differently: Place 1 spray into both nostrils 2 (two) times daily as needed for allergies or rhinitis  (nasal drainage). 04/14/21   Garnet Sierras, DO  Budeson-Glycopyrrol-Formoterol (BREZTRI AEROSPHERE) 160-9-4.8 MCG/ACT AERO Inhale 2 puffs into the lungs in the morning and at bedtime. with spacer and rinse mouth afterwards. 04/14/21   Garnet Sierras, DO  budesonide-formoterol (SYMBICORT) 80-4.5 MCG/ACT inhaler Inhale 2 puffs into the lungs 2 (two) times daily.    [provider]  calcipotriene (DOVONOX) 0.005 % cream Apply 1 Application topically daily as needed (feet). 04/08/20   [provider]  carvedilol (COREG) 12.5 MG tablet Take 6.25 mg by mouth 2 (two) times daily with a meal.  11/09/19   [provider]  Cellulose Carmellose Sodium POWD Apply 1 application topically daily as needed (irritation). Apply to feet    [provider]  Cholecalciferol (VITAMIN D-3 PO) Take 1 capsule by mouth daily.    [provider]  clobetasol ointment (TEMOVATE) 8.41 % Apply 1 application  topically 2 (two) times daily as needed (rash). 11/13/19   [provider]  cyclobenzaprine (FLEXERIL) 5 MG tablet Take 10 mg by mouth at bedtime as needed for muscle spasms. 08/08/21   [provider]  doxazosin (CARDURA) 1 MG tablet Take 1 mg by mouth daily.    [provider]  EPINEPHrine 0.3 mg/0.3 mL IJ SOAJ injection Inject 0.3 mLs (0.3 mg total) into the muscle as needed for anaphylaxis. 11/16/19   Kennith Gain, MD  EYLEA 2 MG/0.05ML SOLN Place into the left eye every 30 (thirty) days. INJECTION 04/17/19   [provider]  Fluocinolone Acetonide Body 0.01 % OIL Apply 1 application  topically daily as needed (dry skin). 11/09/19   [provider]  fluticasone (FLONASE) 50 MCG/ACT nasal spray Place 1 spray into both nostrils 2 (two) times daily as needed (nasal congestion). 04/14/21   Garnet Sierras, DO  furosemide (LASIX) 20 MG tablet Take 20 mg by mouth daily. 02/02/20   [provider]  ketoconazole (NIZORAL) 2 % shampoo Apply 1  application  topically every Monday, Wednesday, and Friday.    [provider]  metFORMIN (GLUCOPHAGE) 500 MG tablet Take 250 mg by mouth 2 (two) times daily with a meal.    [provider]  methocarbamol (ROBAXIN) 500 MG tablet Take 1.5 tablets (750 mg total) by mouth every 6 (six) hours as needed for up to 30 doses for muscle spasms (Neck Stiffness). 07/14/21   Antonieta Pert, MD  montelukast (SINGULAIR) 10 MG tablet Take 1 tablet (10 mg total) by mouth at bedtime. 08/28/20   Garnet Sierras, DO  Multiple Vitamins-Minerals (MULTIVITAMIN & MINERAL PO) Take 1 tablet by mouth daily. Reported on 06/17/2015    [provider]  omeprazole (PRILOSEC) 20 MG capsule Take 20 mg by mouth daily.    [provider]  OneTouch Delica Lancets 66A  MISC daily. 09/01/19   [provider]  [facility] ULTRA test strip as needed.  09/01/19   [provider]  OXYGEN Inhale 2 L into the lungs continuous.    [provider]  potassium chloride (KLOR-CON) 10 MEQ tablet Take 2 tablets (20 mEq total) by mouth daily for 7 days. 08/29/21 09/05/21  Fatima Blank, MD  Respiratory Therapy Supplies (FLUTTER) DEVI Use as directed 04/20/16   Bobbitt, [place] Muta, MD  rosuvastatin (CRESTOR) 20 MG tablet Take 10 mg by mouth at bedtime. 08/11/21   [provider]  sertraline (ZOLOFT) 100 MG tablet Take 100 mg by mouth daily. 04/05/19   [provider]  simethicone (MYLICON) 80 MG chewable tablet Chew 80 mg by mouth daily.    [provider]    Family History Family History  Problem Relation Age of Onset   Emphysema Father    Asthma Father    Heart disease Father    Heart attack Father    Stroke Mother    Heart attack Brother    Diabetes Brother    Heart attack Brother    Diabetes Brother    Colon cancer Neg Hx     Social History Social History   Tobacco Use   Smoking status: Former    Packs/day: 1.00    Years: 20.00    Total pack years:  20.00    Types: Cigarettes    Quit date: 03/09/1985    Years since quitting: 37.0   Smokeless tobacco: Never  Vaping Use   Vaping Use: Never used  Substance Use Topics   Alcohol use: No   Drug use: No     Allergies   Codeine, Lipitor [atorvastatin], and Pravachol [pravastatin sodium]   Review of Systems Review of Systems Per HPI  Physical Exam Triage Vital Signs ED Triage Vitals  Enc Vitals Group     BP 03/08/22 0931 (!) 149/78     Pulse Rate 03/08/22 0931 99     Resp 03/08/22 0931 16     Temp 03/08/22 0931 99.2 F (37.3 C)     Temp Source 03/08/22 0931 Oral     SpO2 03/08/22 0931 93 %     Weight --      Height --      Head Circumference --      Peak Flow --      Pain Score 03/08/22 0932 3     Pain Loc --      Pain Edu? --      Excl. in [place]? --    No data found.  Updated Vital Signs BP (!) 149/78 (BP Location: Left Arm)   Pulse 99   Temp 99.2 F (37.3 C) (Oral)   Resp 16   SpO2 97%   Visual Acuity Right Eye Distance:   Left Eye Distance:   Bilateral Distance:    Right Eye Near:   Left Eye Near:    Bilateral Near:     Physical Exam Constitutional:      General: He is not in acute distress.    Appearance: Normal appearance. He is not toxic-appearing or diaphoretic.  HENT:     Head: Normocephalic and atraumatic.     Right Ear: Tympanic membrane and ear canal normal.     Left Ear: Tympanic membrane and ear canal normal.     Nose: Congestion present.     Mouth/Throat:     Mouth: Mucous membranes are moist.     Pharynx: No  posterior oropharyngeal erythema.  Eyes:     Extraocular Movements: Extraocular movements intact.     Conjunctiva/sclera: Conjunctivae normal.     Pupils: Pupils are equal, round, and reactive to light.  Cardiovascular:     Rate and Rhythm: Normal rate and regular rhythm.     Pulses: Normal pulses.     Heart sounds: Normal heart sounds.  Pulmonary:     Effort: Pulmonary effort is normal. No respiratory distress.     Breath  sounds: Normal breath sounds. No stridor. No wheezing, rhonchi or rales.  Abdominal:     General: Abdomen is flat. Bowel sounds are normal.     Palpations: Abdomen is soft.  Musculoskeletal:        General: Normal range of motion.     Cervical back: Normal range of motion.  Skin:    General: Skin is warm and dry.  Neurological:     General: No focal deficit present.     Mental Status: He is alert and oriented to person, place, and time. Mental status is at baseline.  Psychiatric:        Mood and Affect: Mood normal.        Behavior: Behavior normal.      UC Treatments / Results  Labs (all labs ordered are listed, but only abnormal results are displayed) Labs Reviewed  SARS CORONAVIRUS 2 (TAT 6-24 HRS)  POCT INFLUENZA A/B    EKG   Radiology No results found.  Procedures Procedures (including critical Hansen time)  Medications Ordered in UC Medications - No data to display  Initial Impression / Assessment and Plan / UC Course  I have reviewed the triage vital signs and the nursing notes.  Pertinent labs & imaging results that were available during my Hansen of the patient were reviewed by me and considered in my medical decision making (see chart for details).     Patient presents with symptoms likely from a viral upper respiratory infection. Differential includes bacterial pneumonia, sinusitis, allergic rhinitis, COVID-19, flu, RSV. Do not suspect underlying cardiopulmonary process. Symptoms seem unlikely related to ACS, CHF or COPD exacerbations, pneumonia, pneumothorax. Patient is nontoxic appearing and not in need of emergent medical intervention.  Rapid flu was negative.  COVID test is pending.  Do not have the ability to send flu PCR or complete RSV testing in clinic at this time.  Patient appears physically well on exam with no adventitious lung sounds, no tachypnea, no shortness of breath, and oxygen is stable on 2 L of oxygen.  Therefore, do not think that chest  imaging or emergent evaluation is necessary.  Recommended symptom control with medications and supportive Hansen.  Patient already prescribed Flonase and will prescribe benzonatate to take as needed for cough.  Encouraged albuterol use as needed.  Return if symptoms fail to improve.  Patient advised to monitor his oxygen very closely at home and to go straight to the hospital if symptoms persist or worsen or if oxygen is low.  Patient states understanding and is agreeable.  If COVID test is positive and patient is in the 5-day treatment window, GFR is over 60 so he would qualify for Paxlovid.  Although, he would need to stop rosuvastatin to take this medication.  Paxlovid can also increase some of his heart/blood pressure medication effectiveness so better option for COVID antiviral may be molnupiravir if patient is interested in antiviral therapy.  Discharged with PCP followup.  Final Clinical Impressions(s) / UC Diagnoses   Final diagnoses:  Viral upper respiratory tract infection with cough     Discharge Instructions      Rapid flu was negative.  COVID test is pending.  Will call if it is positive.  It appears that you have a viral illness causing your symptoms.  Please monitor your oxygen very closely at home and go to the hospital if it is consistently in the low 90s or below or if shortness of breath increases.  It appears that you already have been prescribed Flonase so this should be helpful as well.    ED Prescriptions     Medication Sig Dispense Auth. Provider   benzonatate (TESSALON) 100 MG capsule Take 1 capsule (100 mg total) by mouth every 8 (eight) hours as needed for cough. 21 capsule [place], Michele Rockers, [place]      PDMP not reviewed this encounter.   Teodora Medici, [place] 03/08/22 1027    Teodora Medici, [place] 03/08/22 1031

## 2022-03-08 NOTE — ED Triage Notes (Signed)
Pt here for cough and congestion x 2 days; pt on home O2 at 2 L

## 2022-03-09 LAB — SARS CORONAVIRUS 2 (TAT 6-24 HRS): SARS Coronavirus 2: NEGATIVE

## 2022-04-29 DIAGNOSIS — I10 Essential (primary) hypertension: Secondary | ICD-10-CM | POA: Diagnosis not present

## 2022-04-29 DIAGNOSIS — Z0000 Encounter for general adult medical examination without abnormal findings: Secondary | ICD-10-CM | POA: Diagnosis not present

## 2022-04-29 DIAGNOSIS — F329 Major depressive disorder, single episode, unspecified: Secondary | ICD-10-CM | POA: Diagnosis not present

## 2022-04-29 DIAGNOSIS — Z9981 Dependence on supplemental oxygen: Secondary | ICD-10-CM | POA: Diagnosis not present

## 2022-04-29 DIAGNOSIS — J449 Chronic obstructive pulmonary disease, unspecified: Secondary | ICD-10-CM | POA: Diagnosis not present

## 2022-04-29 DIAGNOSIS — E1151 Type 2 diabetes mellitus with diabetic peripheral angiopathy without gangrene: Secondary | ICD-10-CM | POA: Diagnosis not present

## 2022-04-29 DIAGNOSIS — I739 Peripheral vascular disease, unspecified: Secondary | ICD-10-CM | POA: Diagnosis not present

## 2022-04-29 DIAGNOSIS — E785 Hyperlipidemia, unspecified: Secondary | ICD-10-CM | POA: Diagnosis not present

## 2022-04-29 DIAGNOSIS — I5032 Chronic diastolic (congestive) heart failure: Secondary | ICD-10-CM | POA: Diagnosis not present

## 2022-04-29 DIAGNOSIS — M47819 Spondylosis without myelopathy or radiculopathy, site unspecified: Secondary | ICD-10-CM | POA: Diagnosis not present

## 2022-04-29 DIAGNOSIS — G4733 Obstructive sleep apnea (adult) (pediatric): Secondary | ICD-10-CM | POA: Diagnosis not present

## 2022-05-07 DIAGNOSIS — E785 Hyperlipidemia, unspecified: Secondary | ICD-10-CM | POA: Diagnosis not present

## 2022-05-07 DIAGNOSIS — I10 Essential (primary) hypertension: Secondary | ICD-10-CM | POA: Diagnosis not present

## 2022-05-07 DIAGNOSIS — E559 Vitamin D deficiency, unspecified: Secondary | ICD-10-CM | POA: Diagnosis not present

## 2022-05-07 DIAGNOSIS — E118 Type 2 diabetes mellitus with unspecified complications: Secondary | ICD-10-CM | POA: Diagnosis not present

## 2022-08-17 ENCOUNTER — Ambulatory Visit (INDEPENDENT_AMBULATORY_CARE_PROVIDER_SITE_OTHER): Payer: Medicare Other

## 2022-08-17 ENCOUNTER — Encounter (HOSPITAL_COMMUNITY): Payer: Self-pay

## 2022-08-17 ENCOUNTER — Ambulatory Visit (HOSPITAL_COMMUNITY)
Admission: EM | Admit: 2022-08-17 | Discharge: 2022-08-17 | Disposition: A | Discharge status: Home / Self Care | Payer: Medicare Other | Attending: Family Medicine | Admitting: Family Medicine

## 2022-08-17 DIAGNOSIS — S79912A Unspecified injury of left hip, initial encounter: Secondary | ICD-10-CM

## 2022-08-17 DIAGNOSIS — M25552 Pain in left hip: Secondary | ICD-10-CM | POA: Diagnosis not present

## 2022-08-17 MED ORDER — KETOROLAC TROMETHAMINE 30 MG/ML IJ SOLN
INTRAMUSCULAR | Status: AC
  Filled 2022-08-17: qty 1

## 2022-08-17 MED ORDER — KETOROLAC TROMETHAMINE 30 MG/ML IJ SOLN
15.0000 mg | Freq: Once | INTRAMUSCULAR | Status: AC
  Administered 2022-08-17: 15 mg via INTRAMUSCULAR

## 2022-08-17 MED ORDER — CYCLOBENZAPRINE HCL 5 MG PO TABS: 5.0000 mg | ORAL_TABLET | Freq: Every evening | ORAL | 0 refills | Status: AC | PRN

## 2022-08-17 MED ORDER — PREDNISONE 20 MG PO TABS: 20.0000 mg | ORAL_TABLET | Freq: Every day | ORAL | 0 refills | Status: AC

## 2022-08-17 MED ORDER — DEXAMETHASONE SODIUM PHOSPHATE 10 MG/ML IJ SOLN
10.0000 mg | Freq: Once | INTRAMUSCULAR | Status: AC
  Administered 2022-08-17: 10 mg via INTRAMUSCULAR

## 2022-08-17 MED ORDER — DEXAMETHASONE SODIUM PHOSPHATE 10 MG/ML IJ SOLN
INTRAMUSCULAR | Status: AC
  Filled 2022-08-17: qty 1

## 2022-08-17 NOTE — ED Triage Notes (Signed)
Pt is here for a fall several days ago. Pt reports L-hip pain and tailbone pain.Denies any LOC.

## 2022-08-17 NOTE — ED Provider Notes (Signed)
[facility]    CSN: [id number] Arrival date & time: 08/17/22  1441      History   Chief Complaint Chief Complaint  Patient presents with   Hip Pain   Fall    HPI Douglas Hansen is a 77 y.o. male.   HPI Patient presents for evaluation of left hip pain after a fall.Patient reports tripping when attempting to get out of bed last week and loss balance and fell on the floor. He landed on the carpet and family member was home to assist with getting up. He reports pain after fall however he has been able to continue to ambulate. He reports over the last few days   Past Medical History:  Diagnosis Date   Agent orange exposure    Arthritis    Asthma    Asthma with acute exacerbation 06/17/2015   COPD (chronic obstructive pulmonary disease) (HCC)    Diabetes mellitus    Hyperlipemia    Hypertension    OSA on CPAP    Pneumonia    Pneumonia due to COVID-19 virus 11/22/2019   PTSD (post-traumatic stress disorder)     Patient Active Problem List   Diagnosis Date Noted   COPD with acute exacerbation (HCC) 07/14/2021   Hyperkalemia 07/14/2021   Acute on chronic respiratory failure with hypoxia (HCC) 07/13/2021   Acute respiratory failure with hypoxia (HCC) 07/12/2021   Chest pain 01/02/2020   Hypoxia 11/22/2019   Dyslipidemia 06/03/2016   Other fatigue 06/03/2016   Claudication (HCC) 06/03/2016   Gastroesophageal reflux disease without esophagitis 06/17/2015   Acute pulmonary edema (HCC) 04/17/2015   Chronic obstructive pulmonary disease, unspecified copd, unspecified chronic bronchitis type 01/07/2015   Moderate persistent asthma 01/07/2015   Seasonal and perennial allergic rhinitis 01/07/2015   Asthma-COPD overlap syndrome     Class: Chronic   Pneumonia due to other gram-negative bacteria 01/30/2011   Cough 06/09/2010   COPD (chronic obstructive pulmonary disease) (HCC) 06/09/2010   OSA on CPAP    Diabetes mellitus (HCC)    Hyperlipemia    Essential  hypertension     Past Surgical History:  Procedure Laterality Date   CARDIAC CATHETERIZATION     right thumb surgery     TONSILLECTOMY         Home Medications    Prior to Admission medications   Medication Sig Start Date End Date Taking? Authorizing Provider  albuterol (PROVENTIL) (2.5 MG/3ML) 0.083% nebulizer solution Take 3 mLs (2.5 mg total) by nebulization every 4 (four) hours as needed for wheezing or shortness of breath (coughing fits). 04/14/21  Yes Ellamae Sia, DO  albuterol (VENTOLIN HFA) 108 (90 Base) MCG/ACT inhaler INHALE 2 PUFFS BY MOUTH EVERY 4 HOURS AS NEEDED FOR WHEEZING OR SHORTNESS OF BREATH Patient taking differently: Inhale 2 puffs into the lungs every 4 (four) hours as needed for wheezing or shortness of breath. 08/22/19  Yes Ellamae Sia, DO  amLODipine (NORVASC) 10 MG tablet Take 10 mg by mouth daily. 04/06/19  Yes [provider]  ascorbic acid (VITAMIN C) 500 MG tablet Take 1,000 mg by mouth daily.  04/06/19  Yes [provider]  aspirin EC 81 MG EC tablet Take 1 tablet (81 mg total) by mouth daily. 07/17/21  Yes Duke, Roe Rutherford, PA  azelastine (ASTELIN) 0.1 % nasal spray Place 1-2 sprays into both nostrils 2 (two) times daily as needed (nasal drainage). Use in each nostril as directed Patient taking differently: Place 1 spray into both nostrils  2 (two) times daily as needed for allergies or rhinitis (nasal drainage). 04/14/21  Yes Ellamae Sia, DO  Budeson-Glycopyrrol-Formoterol (BREZTRI AEROSPHERE) 160-9-4.8 MCG/ACT AERO Inhale 2 puffs into the lungs in the morning and at bedtime. with spacer and rinse mouth afterwards. 04/14/21  Yes Ellamae Sia, DO  carvedilol (COREG) 12.5 MG tablet Take 6.25 mg by mouth 2 (two) times daily with a meal.  11/09/19  Yes [provider]  Cellulose Carmellose Sodium POWD Apply 1 application topically daily as needed (irritation). Apply to feet   Yes [provider]  Cholecalciferol (VITAMIN D-3 PO) Take 1  capsule by mouth daily.   Yes [provider]  cyclobenzaprine (FLEXERIL) 5 MG tablet Take 1 tablet (5 mg total) by mouth at bedtime as needed for muscle spasms. 08/17/22  Yes Bing Neighbors, NP  doxazosin (CARDURA) 1 MG tablet Take 1 mg by mouth daily.   Yes [provider]  EYLEA 2 MG/0.05ML SOLN Place into the left eye every 30 (thirty) days. INJECTION 04/17/19  Yes [provider]  fluticasone (FLONASE) 50 MCG/ACT nasal spray Place 1 spray into both nostrils 2 (two) times daily as needed (nasal congestion). 04/14/21  Yes Ellamae Sia, DO  furosemide (LASIX) 20 MG tablet Take 20 mg by mouth daily. 02/02/20  Yes [provider]  metFORMIN (GLUCOPHAGE) 500 MG tablet Take 250 mg by mouth 2 (two) times daily with a meal.   Yes [provider]  montelukast (SINGULAIR) 10 MG tablet Take 1 tablet (10 mg total) by mouth at bedtime. 08/28/20  Yes Ellamae Sia, DO  Multiple Vitamins-Minerals (MULTIVITAMIN & MINERAL PO) Take 1 tablet by mouth daily. Reported on 06/17/2015   Yes [provider]  omeprazole (PRILOSEC) 20 MG capsule Take 20 mg by mouth daily.   Yes [provider]  OneTouch Delica Lancets 33G MISC daily. 09/01/19  Yes [provider]  [facility] ULTRA test strip as needed.  09/01/19  Yes [provider]  OXYGEN Inhale 2 L into the lungs continuous.   Yes [provider]  predniSONE (DELTASONE) 20 MG tablet Take 1 tablet (20 mg total) by mouth daily with breakfast for 5 days. 08/17/22 08/22/22 Yes Bing Neighbors, NP  Respiratory Therapy Supplies (FLUTTER) DEVI Use as directed 04/20/16  Yes Bobbitt, Heywood Iles, MD  rosuvastatin (CRESTOR) 20 MG tablet Take 10 mg by mouth at bedtime. 08/11/21  Yes [provider]  sertraline (ZOLOFT) 100 MG tablet Take 100 mg by mouth daily. 04/05/19  Yes [provider]  simethicone (MYLICON) 80 MG chewable tablet Chew 80 mg by mouth daily.   Yes [provider]  benzonatate (TESSALON) 100 MG capsule Take 1 capsule (100 mg total) by mouth every 8 (eight) hours as needed for cough. 03/08/22   Gustavus Bryant, FNP  budesonide-formoterol (SYMBICORT) 80-4.5 MCG/ACT inhaler Inhale 2 puffs into the lungs 2 (two) times daily.    [provider]  calcipotriene (DOVONOX) 0.005 % cream Apply 1 Application topically daily as needed (feet). 04/08/20   [provider]  clobetasol ointment (TEMOVATE) 0.05 % Apply 1 application  topically 2 (two) times daily as needed (rash). 11/13/19   [provider]  EPINEPHrine 0.3 mg/0.3 mL IJ SOAJ injection Inject 0.3 mLs (0.3 mg total) into the muscle as needed for anaphylaxis. 11/16/19   Marcelyn Bruins, MD  Fluocinolone Acetonide Body 0.01 % OIL Apply 1 application  topically daily as needed (dry skin). 11/09/19  [provider]  ketoconazole (NIZORAL) 2 % shampoo Apply 1 application  topically every Monday, Wednesday, and Friday.    [provider]  methocarbamol (ROBAXIN) 500 MG tablet Take 1.5 tablets (750 mg total) by mouth every 6 (six) hours as needed for up to 30 doses for muscle spasms (Neck Stiffness). 07/14/21   Lanae Boast, MD  potassium chloride (KLOR-CON) 10 MEQ tablet Take 2 tablets (20 mEq total) by mouth daily for 7 days. 08/29/21 09/05/21  Nira Conn, MD    Family History Family History  Problem Relation Age of Onset   Emphysema Father    Asthma Father    Heart disease Father    Heart attack Father    Stroke Mother    Heart attack Brother    Diabetes Brother    Heart attack Brother    Diabetes Brother    Colon cancer Neg Hx     Social History Social History   Tobacco Use   Smoking status: Former    Packs/day: 1.00    Years: 20.00    Additional pack years: 0.00    Total pack years: 20.00    Types: Cigarettes    Quit date: 03/09/1985    Years since quitting: 37.4   Smokeless tobacco: Never  Vaping Use   Vaping Use: Never  used  Substance Use Topics   Alcohol use: No   Drug use: No     Allergies   Codeine, Lipitor [atorvastatin], and Pravachol [pravastatin sodium]   Review of Systems Review of Systems Pertinent negatives listed in HPI   Physical Exam Triage Vital Signs ED Triage Vitals  Enc Vitals Group     BP 08/17/22 1552 (!) 101/59     Pulse Rate 08/17/22 1552 72     Resp 08/17/22 1552 18     Temp 08/17/22 1552 98.6 F (37 C)     Temp Source 08/17/22 1552 Oral     SpO2 08/17/22 1603 91 %     Weight --      Height --      Head Circumference --      Peak Flow --      Pain Score 08/17/22 1602 9     Pain Loc --      Pain Edu? --      Excl. in GC? --    No data found.  Updated Vital Signs BP (!) 101/59 (BP Location: Left Arm)   Pulse 72   Temp 98.6 F (37 C) (Oral)   Resp 18   SpO2 91%   Visual Acuity Right Eye Distance:   Left Eye Distance:   Bilateral Distance:    Right Eye Near:   Left Eye Near:    Bilateral Near:     Physical Exam Constitutional:      Appearance: Normal appearance.  HENT:     Head: Normocephalic and atraumatic.  Eyes:     Extraocular Movements: Extraocular movements intact.     Conjunctiva/sclera: Conjunctivae normal.     Pupils: Pupils are equal, round, and reactive to light.  Cardiovascular:     Rate and Rhythm: Normal rate and regular rhythm.  Pulmonary:     Effort: Pulmonary effort is normal.     Breath sounds: Normal breath sounds.  Musculoskeletal:        General: Normal range of motion.     Cervical back: Normal range of motion.     Right hip: Normal.     Left hip: Tenderness and  bony tenderness present. Decreased strength.  Skin:    General: Skin is warm.  Neurological:     General: No focal deficit present.     Mental Status: He is alert.     Gait: Gait abnormal.    UC Treatments / Results  Labs (all labs ordered are listed, but only abnormal results are displayed) Labs Reviewed - No data to display  EKG   Radiology DG  Hip Unilat With Pelvis 2-3 Views Left  Result Date: 08/17/2022 CLINICAL DATA:  Trauma, fall, pain EXAM: DG HIP (WITH OR WITHOUT PELVIS) 2-3V LEFT COMPARISON:  None Available. FINDINGS: No recent fracture or dislocation is seen. Joint spaces in both hips appear symmetrical. IMPRESSION: No fracture or dislocation is seen left hip. Electronically Signed   By: Ernie Avena M.D.   On: 08/17/2022 18:02    Procedures Procedures (including critical care time)  Medications Ordered in UC Medications  ketorolac (TORADOL) 30 MG/ML injection 15 mg (15 mg Intramuscular Given 08/17/22 1758)  dexamethasone (DECADRON) injection 10 mg (10 mg Intramuscular Given 08/17/22 1759)    Initial Impression / Assessment and Plan / UC Course  I have reviewed the triage vital signs and the nursing notes.  Pertinent labs & imaging results that were available during my care of the patient were reviewed by me and considered in my medical decision making (see chart for details).    Left Hip Injury, imaging of left hip, negative for fracture or dislocation. Prednisone 20 mg daily x 5 days and Cyclobenzaprine 5 mg at bedtimes Decadron and Toradol IM injection given here in clinic. If symptoms worsen or do not improve, follow-up with [facility]. Final Clinical Impressions(s) / UC Diagnoses   Final diagnoses:  Hip injury, left, initial encounter     Discharge Instructions      Your hip pain and mobility should improve within the next 5 days after completing treatment.  If you are still having pain with walking and with sitting I would like for you to follow-up with an orthopedic specialist at [facility].  The information to the offices listed on the discharge paperwork. Start prednisone tomorrow morning take with food take 5 mg for the next 5 days with food.  Take cyclobenzaprine at bedtime as needed for pain.     ED Prescriptions     Medication Sig Dispense Auth. Provider   predniSONE (DELTASONE) 20 MG  tablet Take 1 tablet (20 mg total) by mouth daily with breakfast for 5 days. 5 tablet Bing Neighbors, NP   cyclobenzaprine (FLEXERIL) 5 MG tablet Take 1 tablet (5 mg total) by mouth at bedtime as needed for muscle spasms. 14 tablet Bing Neighbors, NP      PDMP not reviewed this encounter.   Bing Neighbors, NP 08/18/22 0002

## 2022-08-17 NOTE — Discharge Instructions (Addendum)
Your hip pain and mobility should improve within the next 5 days after completing treatment.  If you are still having pain with walking and with sitting I would like for you to follow-up with an orthopedic specialist at [facility].  The information to the offices listed on the discharge paperwork. Start prednisone tomorrow morning take with food take 5 mg for the next 5 days with food.  Take cyclobenzaprine at bedtime as needed for pain.

## 2022-08-28 ENCOUNTER — Ambulatory Visit: Admission: EM | Admit: 2022-08-28 | Discharge: 2022-08-28 | Payer: Medicare Other

## 2022-08-28 DIAGNOSIS — M545 Low back pain, unspecified: Secondary | ICD-10-CM | POA: Diagnosis not present

## 2022-08-28 DIAGNOSIS — M25552 Pain in left hip: Secondary | ICD-10-CM

## 2022-08-28 NOTE — ED Provider Notes (Signed)
[facility]    CSN: [id number] Arrival date & time: 08/28/22  1116      History   Chief Complaint No chief complaint on file.   HPI Douglas Hansen is a 77 y.o. male. He fell and hurt his L hip back in May. It is still hurting him although he thinks it might be a little bit less painful. WAs seen at Urgent care 6/10, x-ray negative, instructed to f/u with [facility] if not improving. Pt has not f/u with ortho. Can walk but it hurts, better when he is not walking. Tylenol isn't working to relieve his pain anymore.   HPI  Past Medical History:  Diagnosis Date   Agent orange exposure    Arthritis    Asthma    Asthma with acute exacerbation 06/17/2015   COPD (chronic obstructive pulmonary disease) (HCC)    Diabetes mellitus    Hyperlipemia    Hypertension    OSA on CPAP    Pneumonia    Pneumonia due to COVID-19 virus 11/22/2019   PTSD (post-traumatic stress disorder)     Patient Active Problem List   Diagnosis Date Noted   COPD with acute exacerbation (HCC) 07/14/2021   Hyperkalemia 07/14/2021   Acute on chronic respiratory failure with hypoxia (HCC) 07/13/2021   Acute respiratory failure with hypoxia (HCC) 07/12/2021   Chest pain 01/02/2020   Hypoxia 11/22/2019   Dyslipidemia 06/03/2016   Other fatigue 06/03/2016   Claudication (HCC) 06/03/2016   Gastroesophageal reflux disease without esophagitis 06/17/2015   Acute pulmonary edema (HCC) 04/17/2015   Chronic obstructive pulmonary disease, unspecified copd, unspecified chronic bronchitis type 01/07/2015   Moderate persistent asthma 01/07/2015   Seasonal and perennial allergic rhinitis 01/07/2015   Asthma-COPD overlap syndrome     Class: Chronic   Pneumonia due to other gram-negative bacteria 01/30/2011   Cough 06/09/2010   COPD (chronic obstructive pulmonary disease) (HCC) 06/09/2010   OSA on CPAP    Diabetes mellitus (HCC)    Hyperlipemia    Essential hypertension     Past Surgical History:   Procedure Laterality Date   CARDIAC CATHETERIZATION     right thumb surgery     TONSILLECTOMY         Home Medications    Prior to Admission medications   Medication Sig Start Date End Date Taking? Authorizing Provider  albuterol (PROVENTIL) (2.5 MG/3ML) 0.083% nebulizer solution Take 3 mLs (2.5 mg total) by nebulization every 4 (four) hours as needed for wheezing or shortness of breath (coughing fits). 04/14/21   Ellamae Sia, DO  albuterol (VENTOLIN HFA) 108 (90 Base) MCG/ACT inhaler INHALE 2 PUFFS BY MOUTH EVERY 4 HOURS AS NEEDED FOR WHEEZING OR SHORTNESS OF BREATH Patient taking differently: Inhale 2 puffs into the lungs every 4 (four) hours as needed for wheezing or shortness of breath. 08/22/19   Ellamae Sia, DO  amLODipine (NORVASC) 10 MG tablet Take 10 mg by mouth daily. 04/06/19   [provider]  ascorbic acid (VITAMIN C) 500 MG tablet Take 1,000 mg by mouth daily.  04/06/19   [provider]  aspirin EC 81 MG EC tablet Take 1 tablet (81 mg total) by mouth daily. 07/17/21   Duke, Roe Rutherford, PA  azelastine (ASTELIN) 0.1 % nasal spray Place 1-2 sprays into both nostrils 2 (two) times daily as needed (nasal drainage). Use in each nostril as directed Patient taking differently: Place 1 spray into both nostrils 2 (two) times daily as needed for allergies  or rhinitis (nasal drainage). 04/14/21   Ellamae Sia, DO  benzonatate (TESSALON) 100 MG capsule Take 1 capsule (100 mg total) by mouth every 8 (eight) hours as needed for cough. 03/08/22   Gustavus Bryant, FNP  Budeson-Glycopyrrol-Formoterol (BREZTRI AEROSPHERE) 160-9-4.8 MCG/ACT AERO Inhale 2 puffs into the lungs in the morning and at bedtime. with spacer and rinse mouth afterwards. 04/14/21   Ellamae Sia, DO  budesonide-formoterol (SYMBICORT) 80-4.5 MCG/ACT inhaler Inhale 2 puffs into the lungs 2 (two) times daily.    [provider]  calcipotriene (DOVONOX) 0.005 % cream Apply 1 Application topically daily as  needed (feet). 04/08/20   [provider]  carvedilol (COREG) 12.5 MG tablet Take 6.25 mg by mouth 2 (two) times daily with a meal.  11/09/19   [provider]  Cellulose Carmellose Sodium POWD Apply 1 application topically daily as needed (irritation). Apply to feet    [provider]  Cholecalciferol (VITAMIN D-3 PO) Take 1 capsule by mouth daily.    [provider]  clobetasol ointment (TEMOVATE) 0.05 % Apply 1 application  topically 2 (two) times daily as needed (rash). 11/13/19   [provider]  cyclobenzaprine (FLEXERIL) 5 MG tablet Take 1 tablet (5 mg total) by mouth at bedtime as needed for muscle spasms. 08/17/22   Bing Neighbors, NP  doxazosin (CARDURA) 1 MG tablet Take 1 mg by mouth daily.    [provider]  EPINEPHrine 0.3 mg/0.3 mL IJ SOAJ injection Inject 0.3 mLs (0.3 mg total) into the muscle as needed for anaphylaxis. 11/16/19   Marcelyn Bruins, MD  EYLEA 2 MG/0.05ML SOLN Place into the left eye every 30 (thirty) days. INJECTION 04/17/19   [provider]  Fluocinolone Acetonide Body 0.01 % OIL Apply 1 application  topically daily as needed (dry skin). 11/09/19   [provider]  fluticasone (FLONASE) 50 MCG/ACT nasal spray Place 1 spray into both nostrils 2 (two) times daily as needed (nasal congestion). 04/14/21   Ellamae Sia, DO  furosemide (LASIX) 20 MG tablet Take 20 mg by mouth daily. 02/02/20   [provider]  ketoconazole (NIZORAL) 2 % shampoo Apply 1 application  topically every Monday, Wednesday, and Friday.    [provider]  metFORMIN (GLUCOPHAGE) 500 MG tablet Take 250 mg by mouth 2 (two) times daily with a meal.    [provider]  methocarbamol (ROBAXIN) 500 MG tablet Take 1.5 tablets (750 mg total) by mouth every 6 (six) hours as needed for up to 30 doses for muscle spasms (Neck Stiffness). 07/14/21   Lanae Boast, MD  montelukast (SINGULAIR) 10 MG tablet Take 1 tablet (10  mg total) by mouth at bedtime. 08/28/20   Ellamae Sia, DO  Multiple Vitamins-Minerals (MULTIVITAMIN & MINERAL PO) Take 1 tablet by mouth daily. Reported on 06/17/2015    [provider]  omeprazole (PRILOSEC) 20 MG capsule Take 20 mg by mouth daily.    [provider]  OneTouch Delica Lancets 33G MISC daily. 09/01/19   [provider]  [facility] ULTRA test strip as needed.  09/01/19   [provider]  OXYGEN Inhale 2 L into the lungs continuous.    [provider]  potassium chloride (KLOR-CON) 10 MEQ tablet Take 2 tablets (20 mEq total) by mouth daily for 7 days. 08/29/21 09/05/21  Nira Conn, MD  Respiratory Therapy Supplies (FLUTTER) DEVI Use as directed 04/20/16   Bobbitt, Heywood Iles, MD  rosuvastatin (CRESTOR)  20 MG tablet Take 10 mg by mouth at bedtime. 08/11/21   [provider]  sertraline (ZOLOFT) 100 MG tablet Take 100 mg by mouth daily. 04/05/19   [provider]  simethicone (MYLICON) 80 MG chewable tablet Chew 80 mg by mouth daily.    [provider]    Family History Family History  Problem Relation Age of Onset   Emphysema Father    Asthma Father    Heart disease Father    Heart attack Father    Stroke Mother    Heart attack Brother    Diabetes Brother    Heart attack Brother    Diabetes Brother    Colon cancer Neg Hx     Social History Social History   Tobacco Use   Smoking status: Former    Packs/day: 1.00    Years: 20.00    Additional pack years: 0.00    Total pack years: 20.00    Types: Cigarettes    Quit date: 03/09/1985    Years since quitting: 37.4   Smokeless tobacco: Never  Vaping Use   Vaping Use: Never used  Substance Use Topics   Alcohol use: No   Drug use: No     Allergies   Codeine, Lipitor [atorvastatin], and Pravachol [pravastatin sodium]   Review of Systems Review of Systems   Physical Exam Triage Vital Signs ED Triage Vitals  Enc Vitals Group     BP  08/28/22 1130 109/67     Pulse Rate 08/28/22 1130 68     Resp 08/28/22 1130 (!) 22     Temp 08/28/22 1130 97.7 F (36.5 C)     Temp Source 08/28/22 1130 Oral     SpO2 --      Weight --      Height --      Head Circumference --      Peak Flow --      Pain Score 08/28/22 1131 8     Pain Loc --      Pain Edu? --      Excl. in GC? --    No data found.  Updated Vital Signs BP 109/67 (BP Location: Left Arm)   Pulse 68   Temp 97.7 F (36.5 C) (Oral)   Resp (!) 22   Visual Acuity Right Eye Distance:   Left Eye Distance:   Bilateral Distance:    Right Eye Near:   Left Eye Near:    Bilateral Near:     Physical Exam Constitutional:      Appearance: Normal appearance.  Pulmonary:     Effort: Pulmonary effort is normal.  Neurological:     Mental Status: He is alert.      UC Treatments / Results  Labs (all labs ordered are listed, but only abnormal results are displayed) Labs Reviewed - No data to display  EKG   Radiology No results found.  Procedures Procedures (including critical care time)  Medications Ordered in UC Medications - No data to display  Initial Impression / Assessment and Plan / UC Course  I have reviewed the triage vital signs and the nursing notes.  Pertinent labs & imaging results that were available during my care of the patient were reviewed by me and considered in my medical decision making (see chart for details).    Discussed with pt since he is still having pain, he really does need ortho f/u. [facility] has urgent care hours - recommended he go today.  Final Clinical Impressions(s) / UC Diagnoses   Final diagnoses:  Pain of left hip     Discharge Instructions      Please go to [facility] today. They have an urgent care open until 8pm, and you can just walk in.    ED Prescriptions   None    PDMP not reviewed this encounter.   Cathlyn Parsons, NP 08/28/22 [phone]

## 2022-08-28 NOTE — Discharge Instructions (Signed)
Please go to [facility] today. They have an urgent care open until 8pm, and you can just walk in.

## 2022-08-28 NOTE — ED Triage Notes (Signed)
Pt reports he fell x 4 weeks. States his left side hip pain is still severe and the meds that were prescribed did not help. It is hard to lay down.

## 2022-10-16 IMAGING — CR DG CHEST 2V
2 series · 2 of 2 positions shown · non-contrast
Comparison: July 14, 2021

CLINICAL DATA: Questionable sepsis.

EXAM:
CHEST - 2 VIEW

[chest lat]
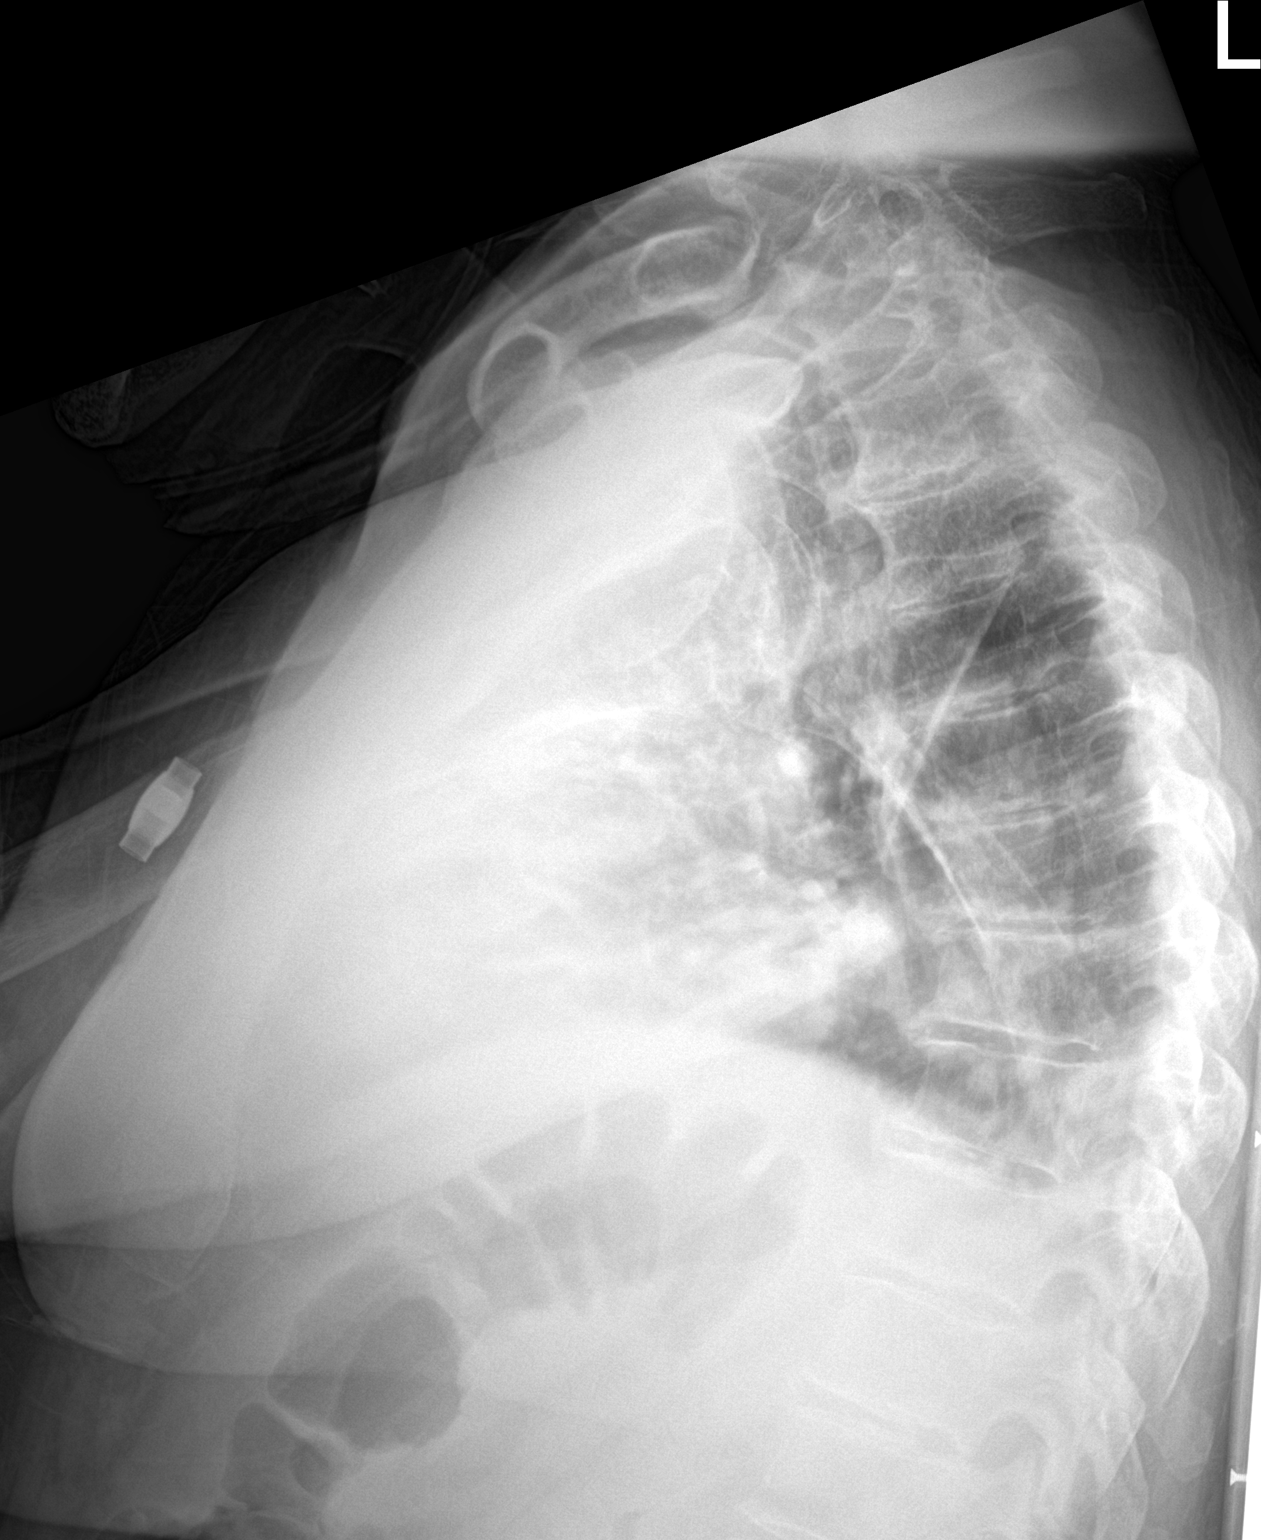

[chest ap]
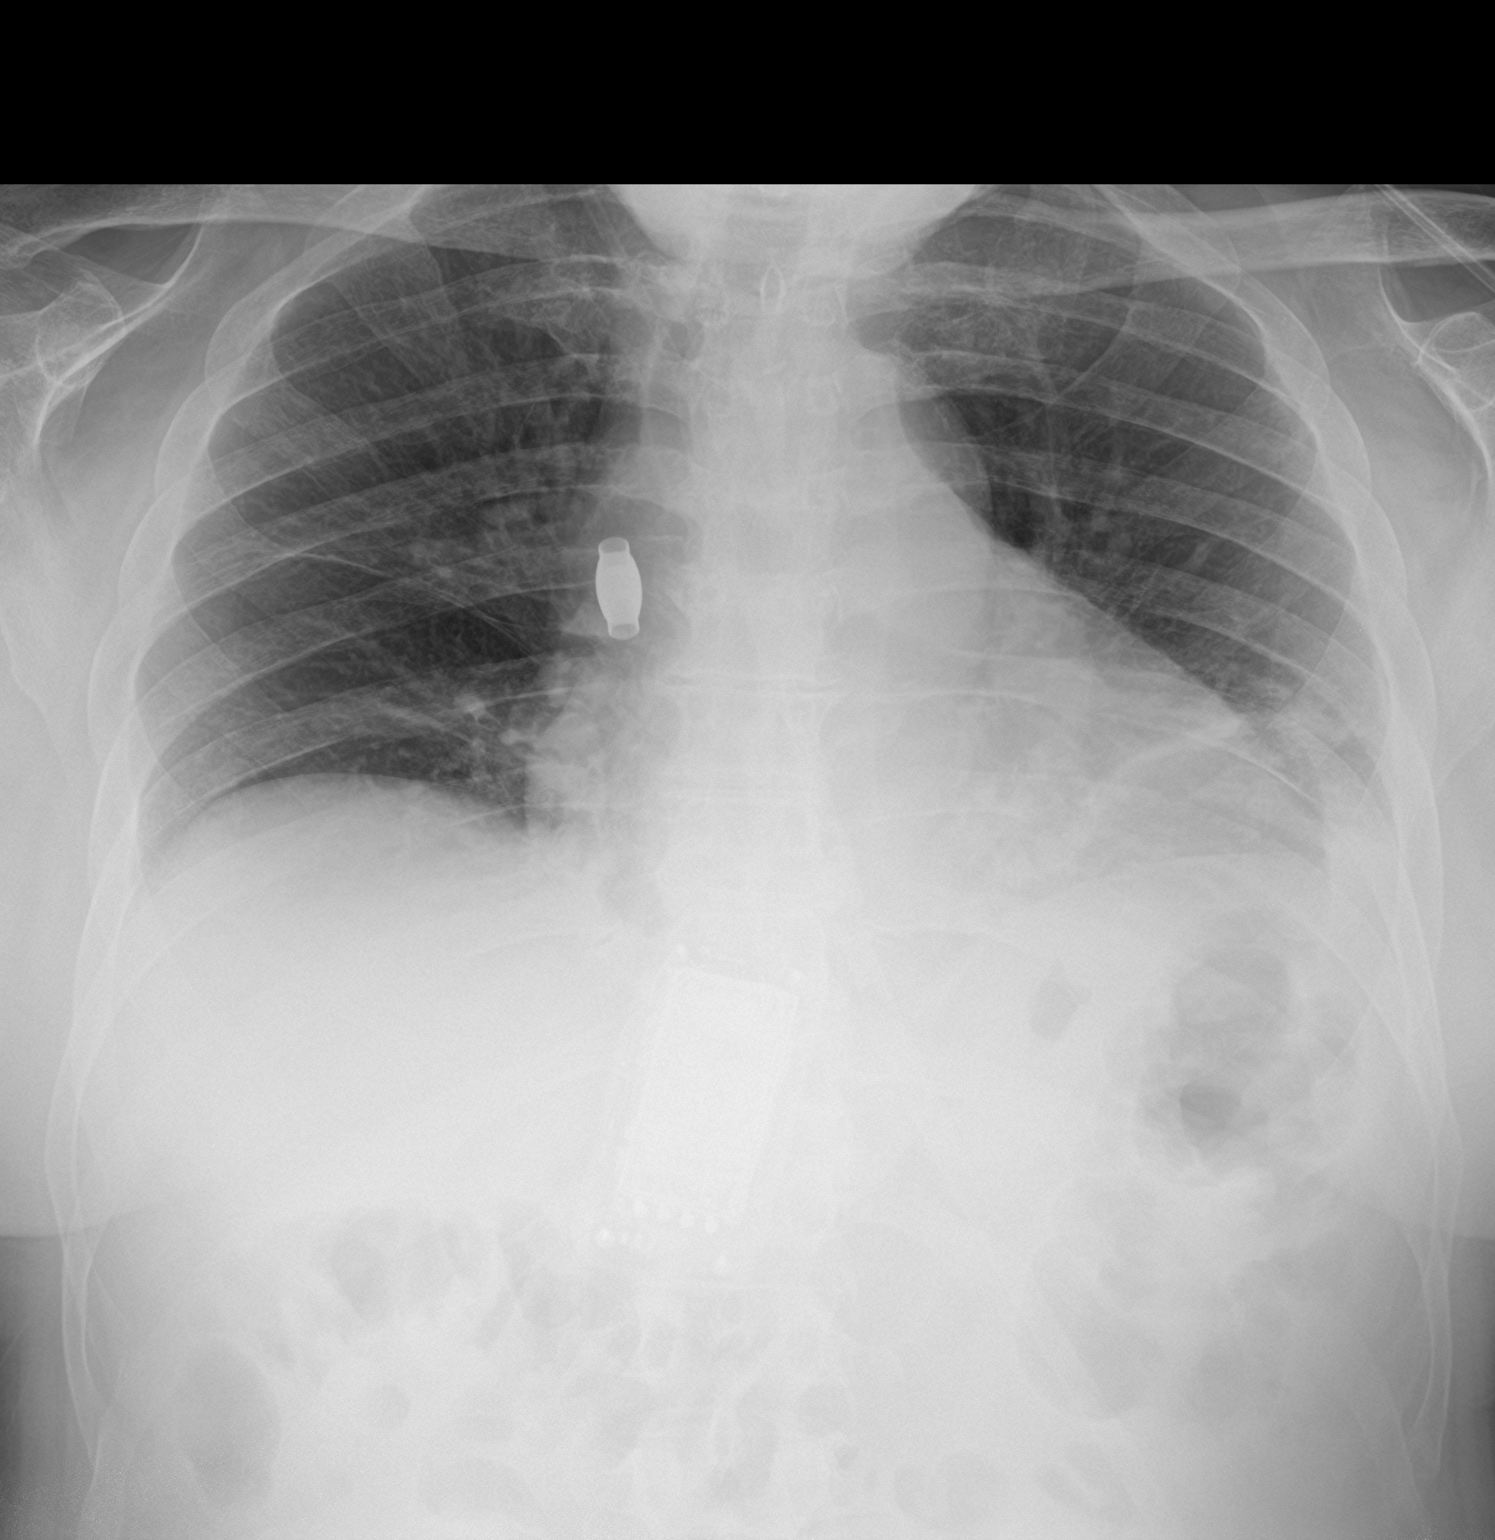

[2 of 2 positions shown; findings below may reference images not displayed]

FINDINGS: The lateral views limited in evaluation secondary to positioning of
the patient's upper extremities. The heart size and mediastinal
contours are within normal limits. Low lung volumes are noted. Mild,
stable atelectasis is seen along the medial aspect of the left lung
base. There is no evidence of a pleural effusion or pneumothorax.
Degenerative changes are seen throughout the thoracic spine.
IMPRESSION: Low lung volumes with mild, stable left basilar atelectasis.

## 2023-10-14 ENCOUNTER — Encounter (HOSPITAL_COMMUNITY): Payer: Self-pay

## 2023-10-14 ENCOUNTER — Other Ambulatory Visit: Payer: Self-pay

## 2023-10-14 ENCOUNTER — Inpatient Hospital Stay (HOSPITAL_COMMUNITY)

## 2023-10-14 ENCOUNTER — Observation Stay (HOSPITAL_COMMUNITY)
Admission: EM | Admit: 2023-10-14 | Discharge: 2023-10-15 | Disposition: A | Discharge status: Home / Self Care | Attending: Internal Medicine | Admitting: Internal Medicine

## 2023-10-14 DIAGNOSIS — D124 Benign neoplasm of descending colon: Secondary | ICD-10-CM | POA: Diagnosis not present

## 2023-10-14 DIAGNOSIS — I2510 Atherosclerotic heart disease of native coronary artery without angina pectoris: Secondary | ICD-10-CM | POA: Insufficient documentation

## 2023-10-14 DIAGNOSIS — D539 Nutritional anemia, unspecified: Secondary | ICD-10-CM | POA: Diagnosis not present

## 2023-10-14 DIAGNOSIS — K2970 Gastritis, unspecified, without bleeding: Secondary | ICD-10-CM | POA: Diagnosis not present

## 2023-10-14 DIAGNOSIS — D649 Anemia, unspecified: Principal | ICD-10-CM

## 2023-10-14 DIAGNOSIS — I10 Essential (primary) hypertension: Secondary | ICD-10-CM | POA: Diagnosis not present

## 2023-10-14 DIAGNOSIS — K625 Hemorrhage of anus and rectum: Secondary | ICD-10-CM | POA: Diagnosis not present

## 2023-10-14 DIAGNOSIS — Z87891 Personal history of nicotine dependence: Secondary | ICD-10-CM | POA: Insufficient documentation

## 2023-10-14 DIAGNOSIS — G4733 Obstructive sleep apnea (adult) (pediatric): Secondary | ICD-10-CM | POA: Diagnosis not present

## 2023-10-14 DIAGNOSIS — K279 Peptic ulcer, site unspecified, unspecified as acute or chronic, without hemorrhage or perforation: Secondary | ICD-10-CM | POA: Diagnosis not present

## 2023-10-14 DIAGNOSIS — E119 Type 2 diabetes mellitus without complications: Secondary | ICD-10-CM | POA: Insufficient documentation

## 2023-10-14 DIAGNOSIS — K219 Gastro-esophageal reflux disease without esophagitis: Secondary | ICD-10-CM | POA: Diagnosis not present

## 2023-10-14 DIAGNOSIS — K869 Disease of pancreas, unspecified: Secondary | ICD-10-CM | POA: Insufficient documentation

## 2023-10-14 DIAGNOSIS — J449 Chronic obstructive pulmonary disease, unspecified: Secondary | ICD-10-CM | POA: Insufficient documentation

## 2023-10-14 DIAGNOSIS — Z7982 Long term (current) use of aspirin: Secondary | ICD-10-CM | POA: Insufficient documentation

## 2023-10-14 DIAGNOSIS — K922 Gastrointestinal hemorrhage, unspecified: Secondary | ICD-10-CM

## 2023-10-14 LAB — IRON AND TIBC
Iron: 69 ug/dL (ref 45–182)
Saturation Ratios: 26 % (ref 17.9–39.5)
TIBC: 269 ug/dL (ref 250–450)
UIBC: 200 ug/dL

## 2023-10-14 LAB — CBC WITH DIFFERENTIAL/PLATELET
Abs Immature Granulocytes: 0.01 K/uL (ref 0.00–0.07)
Basophils Absolute: 0 K/uL (ref 0.0–0.1)
Basophils Relative: 1 %
Eosinophils Absolute: 0.1 K/uL (ref 0.0–0.5)
Eosinophils Relative: 2 %
HCT: 26.9 % — ABNORMAL LOW (ref 39.0–52.0)
Hemoglobin: 8.5 g/dL — ABNORMAL LOW (ref 13.0–17.0)
Immature Granulocytes: 1 %
Lymphocytes Relative: 37 %
Lymphs Abs: 0.8 K/uL (ref 0.7–4.0)
MCH: 33.2 pg (ref 26.0–34.0)
MCHC: 31.6 g/dL (ref 30.0–36.0)
MCV: 105.1 fL — ABNORMAL HIGH (ref 80.0–100.0)
Monocytes Absolute: 0.2 K/uL (ref 0.1–1.0)
Monocytes Relative: 11 %
Neutro Abs: 1 K/uL — ABNORMAL LOW (ref 1.7–7.7)
Neutrophils Relative %: 48 %
Platelets: 151 K/uL (ref 150–400)
RBC: 2.56 MIL/uL — ABNORMAL LOW (ref 4.22–5.81)
RDW: 13.9 % (ref 11.5–15.5)
WBC: 2.1 K/uL — ABNORMAL LOW (ref 4.0–10.5)
nRBC: 0 % (ref 0.0–0.2)

## 2023-10-14 LAB — COMPREHENSIVE METABOLIC PANEL WITH GFR
ALT: 19 U/L (ref 0–44)
AST: 19 U/L (ref 15–41)
Albumin: 3.5 g/dL (ref 3.5–5.0)
Alkaline Phosphatase: 39 U/L (ref 38–126)
Anion gap: 3 — ABNORMAL LOW (ref 5–15)
BUN: 22 mg/dL (ref 8–23)
CO2: 32 mmol/L (ref 22–32)
Calcium: 9.6 mg/dL (ref 8.9–10.3)
Chloride: 105 mmol/L (ref 98–111)
Creatinine, Ser: 1.24 mg/dL (ref 0.61–1.24)
GFR, Estimated: 59 mL/min — ABNORMAL LOW (ref >60)
Glucose, Bld: 96 mg/dL (ref 70–99)
Potassium: 4.3 mmol/L (ref 3.5–5.1)
Sodium: 140 mmol/L (ref 135–145)
Total Bilirubin: 0.4 mg/dL (ref 0.0–1.2)
Total Protein: 6.1 g/dL — ABNORMAL LOW (ref 6.5–8.1)

## 2023-10-14 LAB — VITAMIN B12: Vitamin B-12: 300 pg/mL (ref 180–914)

## 2023-10-14 LAB — HEMOGLOBIN AND HEMATOCRIT, BLOOD
HCT: 28.1 % — ABNORMAL LOW (ref 39.0–52.0)
HCT: 27.5 % — ABNORMAL LOW (ref 39.0–52.0)
Hemoglobin: 9 g/dL — ABNORMAL LOW (ref 13.0–17.0)
Hemoglobin: 8.8 g/dL — ABNORMAL LOW (ref 13.0–17.0)

## 2023-10-14 LAB — TSH: TSH: 1.294 u[IU]/mL (ref 0.350–4.500)

## 2023-10-14 LAB — ABO/RH: ABO/RH(D): O POS

## 2023-10-14 LAB — POC OCCULT BLOOD, ED: Fecal Occult Bld: NEGATIVE

## 2023-10-14 LAB — RETICULOCYTES
Immature Retic Fract: 7.6 % (ref 2.3–15.9)
RBC.: 2.74 MIL/uL — ABNORMAL LOW (ref 4.22–5.81)
Retic Count, Absolute: 32.3 K/uL (ref 19.0–186.0)
Retic Ct Pct: 1.2 % (ref 0.4–3.1)

## 2023-10-14 LAB — TYPE AND SCREEN
ABO/RH(D): O POS
Antibody Screen: NEGATIVE

## 2023-10-14 LAB — GLUCOSE, CAPILLARY
Glucose-Capillary: 87 mg/dL (ref 70–99)
Glucose-Capillary: 91 mg/dL (ref 70–99)

## 2023-10-14 LAB — PROTIME-INR
INR: 1 (ref 0.8–1.2)
Prothrombin Time: 13.8 s (ref 11.4–15.2)

## 2023-10-14 LAB — T4, FREE: Free T4: 0.92 ng/dL (ref 0.61–1.12)

## 2023-10-14 LAB — FOLATE: Folate: 17.5 ng/mL (ref >5.9)

## 2023-10-14 LAB — FERRITIN: Ferritin: 144 ng/mL (ref 24–336)

## 2023-10-14 MED ORDER — ALBUTEROL SULFATE (2.5 MG/3ML) 0.083% IN NEBU: 2.5000 mg | INHALATION_SOLUTION | RESPIRATORY_TRACT | Status: DC | PRN

## 2023-10-14 MED ORDER — ONDANSETRON HCL 4 MG/2ML IJ SOLN: 4.0000 mg | Freq: Four times a day (QID) | INTRAMUSCULAR | Status: DC | PRN

## 2023-10-14 MED ORDER — SERTRALINE HCL 100 MG PO TABS: 100.0000 mg | ORAL_TABLET | Freq: Every day | ORAL | Status: DC

## 2023-10-14 MED ORDER — BENZONATATE 100 MG PO CAPS: 100.0000 mg | ORAL_CAPSULE | Freq: Three times a day (TID) | ORAL | Status: DC | PRN

## 2023-10-14 MED ORDER — AMLODIPINE BESYLATE 10 MG PO TABS
10.0000 mg | ORAL_TABLET | Freq: Every day | ORAL | Status: DC
  Administered 2023-10-14: 10 mg via ORAL
  Filled 2023-10-14: qty 2

## 2023-10-14 MED ORDER — MONTELUKAST SODIUM 10 MG PO TABS
10.0000 mg | ORAL_TABLET | Freq: Every day | ORAL | Status: DC
  Administered 2023-10-14: 10 mg via ORAL
  Filled 2023-10-14: qty 1

## 2023-10-14 MED ORDER — NA SULFATE-K SULFATE-MG SULF 17.5-3.13-1.6 GM/177ML PO SOLN
0.5000 | Freq: Once | ORAL | Status: AC
  Administered 2023-10-14: 177 mL via ORAL

## 2023-10-14 MED ORDER — FLUTICASONE FUROATE-VILANTEROL 100-25 MCG/ACT IN AEPB
1.0000 | INHALATION_SPRAY | Freq: Every day | RESPIRATORY_TRACT | Status: DC
  Filled 2023-10-14: qty 28

## 2023-10-14 MED ORDER — CYANOCOBALAMIN 1000 MCG/ML IJ SOLN
1000.0000 ug | Freq: Once | INTRAMUSCULAR | Status: DC
  Filled 2023-10-14: qty 1

## 2023-10-14 MED ORDER — ACETAMINOPHEN 650 MG RE SUPP
650.0000 mg | Freq: Four times a day (QID) | RECTAL | Status: DC | PRN
Start: 2023-10-14 — End: 2023-10-15

## 2023-10-14 MED ORDER — LACTATED RINGERS IV SOLN: INTRAVENOUS | Status: AC

## 2023-10-14 MED ORDER — BUDESON-GLYCOPYRROL-FORMOTEROL 160-9-4.8 MCG/ACT IN AERO: 2.0000 | INHALATION_SPRAY | Freq: Two times a day (BID) | RESPIRATORY_TRACT | Status: DC

## 2023-10-14 MED ORDER — SODIUM CHLORIDE 0.9 % IV SOLN: INTRAVENOUS | Status: AC

## 2023-10-14 MED ORDER — CARVEDILOL 6.25 MG PO TABS
6.2500 mg | ORAL_TABLET | Freq: Two times a day (BID) | ORAL | Status: DC
  Administered 2023-10-14 – 2023-10-15 (×2): 6.25 mg via ORAL
  Filled 2023-10-14: qty 1
  Filled 2023-10-14: qty 2

## 2023-10-14 MED ORDER — HYDRALAZINE HCL 20 MG/ML IJ SOLN
5.0000 mg | INTRAMUSCULAR | Status: DC | PRN
  Administered 2023-10-14: 5 mg via INTRAVENOUS
  Filled 2023-10-14: qty 1

## 2023-10-14 MED ORDER — SIMETHICONE 80 MG PO CHEW
240.0000 mg | CHEWABLE_TABLET | Freq: Once | ORAL | Status: AC
  Administered 2023-10-14: 240 mg via ORAL
  Filled 2023-10-14: qty 3

## 2023-10-14 MED ORDER — ROSUVASTATIN CALCIUM 20 MG PO TABS
20.0000 mg | ORAL_TABLET | Freq: Every day | ORAL | Status: DC
Start: 2023-10-14 — End: 2023-10-15
  Administered 2023-10-14: 20 mg via ORAL
  Filled 2023-10-14: qty 1

## 2023-10-14 MED ORDER — NA SULFATE-K SULFATE-MG SULF 17.5-3.13-1.6 GM/177ML PO SOLN
0.5000 | Freq: Once | ORAL | Status: AC
  Administered 2023-10-14: 177 mL via ORAL
  Filled 2023-10-14: qty 1

## 2023-10-14 MED ORDER — ONDANSETRON HCL 4 MG PO TABS: 4.0000 mg | ORAL_TABLET | Freq: Four times a day (QID) | ORAL | Status: DC | PRN

## 2023-10-14 MED ORDER — ACETAMINOPHEN 325 MG PO TABS: 650.0000 mg | ORAL_TABLET | Freq: Four times a day (QID) | ORAL | Status: DC | PRN

## 2023-10-14 MED ORDER — PANTOPRAZOLE SODIUM 40 MG IV SOLR
40.0000 mg | Freq: Two times a day (BID) | INTRAVENOUS | Status: DC
  Administered 2023-10-14 (×2): 40 mg via INTRAVENOUS
  Filled 2023-10-14 (×2): qty 10

## 2023-10-14 MED ORDER — ROSUVASTATIN CALCIUM 5 MG PO TABS: 10.0000 mg | ORAL_TABLET | Freq: Every day | ORAL | Status: DC

## 2023-10-14 NOTE — H&P (Signed)
 History and Physical    Patient: Douglas Hansen FMW:[id number] DOB: 1945-11-20 DOA: 10/14/2023 DOS: the patient was seen and examined on 10/14/2023 . PCP: Gerome Brunet, DO  Patient coming from: Home Chief complaint: Chief Complaint  Patient presents with   Hypotension   Abnormal Lab / BRBPR / melanotic stool.   HPI:  Douglas Hansen is a 78 y.o. male with past medical history  of  asthma,COPDx 6-7 yrs,OSA on CPAP, diabetes, hyperlipidemia, hypertension, GERD, claudication, PTSD presenting with intermittent bright red blood per rectum and melanotic stools wife at bedside.  Patient is a limited historian does talk about his arthritis and the vertigo he gets but denies dizziness.  No other symptoms of chest pain palpitation.  Patient did however report left-sided abdominal discomfort or a sensation as if there is a knot which is not there currently.   ED Course:  Vital signs in the ED were notable for the following:  Vitals:   10/14/23 1300 10/14/23 1345 10/14/23 1429 10/14/23 1756  BP: (!) 164/96 (!) 162/68 (!) 171/67 (!) 167/62  Pulse: 63 70 62 64  Temp:   97.8 F (36.6 C)   Resp: 17 18 17    Height:      Weight:      SpO2: 100% 100% 100%   TempSrc:   Oral   BMI (Calculated):       >>ED evaluation thus far shows: CMP is within normal limits. Anemia panel is within normal limits. Vitamin B12 ordered is low normal at 300 and will supplement. Abnormal CBC with a white count of 2.1 hemoglobin of 8.5 normal platelets at 151 MCV of 105.1. Occult today is negative.  >>While in the ED patient had: Anemia panel. Review of Systems  Gastrointestinal:  Positive for blood in stool and melena.  Neurological:  Positive for dizziness.   Past Medical History:  Diagnosis Date   Agent orange exposure    Arthritis    Asthma    Asthma with acute exacerbation 06/17/2015   COPD (chronic obstructive pulmonary disease) (HCC)    Diabetes mellitus    Hyperlipemia    Hypertension    OSA on CPAP     Pneumonia    Pneumonia due to COVID-19 virus 11/22/2019   PTSD (post-traumatic stress disorder)    Past Surgical History:  Procedure Laterality Date   CARDIAC CATHETERIZATION     right thumb surgery     TONSILLECTOMY      reports that he quit smoking about 38 years ago. His smoking use included cigarettes. He started smoking about 58 years ago. He has a 20 pack-year smoking history. He has never used smokeless tobacco. He reports that he does not drink alcohol and does not use drugs. Allergies  Allergen Reactions   Codeine Nausea Only and Other (See Comments)    GI upset   Lipitor [Atorvastatin] Other (See Comments)    Made my stomach burn   Pravachol [Pravastatin Sodium] Other (See Comments)    Caused joint pain   Family History  Problem Relation Age of Onset   Emphysema Father    Asthma Father    Heart disease Father    Heart attack Father    Stroke Mother    Heart attack Brother    Diabetes Brother    Heart attack Brother    Diabetes Brother    Colon cancer Neg Hx    Prior to Admission medications   Medication Sig Start Date End Date Taking? Authorizing Provider  albuterol  (PROVENTIL ) (2.5  MG/3ML) 0.083% nebulizer solution Take 3 mLs (2.5 mg total) by nebulization every 4 (four) hours as needed for wheezing or shortness of breath (coughing fits). 04/14/21   Luke Orlan HERO, DO  albuterol  (VENTOLIN  HFA) 108 (90 Base) MCG/ACT inhaler INHALE 2 PUFFS BY MOUTH EVERY 4 HOURS AS NEEDED FOR WHEEZING OR SHORTNESS OF BREATH Patient taking differently: Inhale 2 puffs into the lungs every 4 (four) hours as needed for wheezing or shortness of breath. 08/22/19   Luke Orlan HERO, DO  amLODipine  (NORVASC ) 10 MG tablet Take 10 mg by mouth daily. 04/06/19   [provider]  ascorbic acid  (VITAMIN C ) 500 MG tablet Take 1,000 mg by mouth daily.  04/06/19   [provider]  aspirin  EC 81 MG EC tablet Take 1 tablet (81 mg total) by mouth daily. 07/17/21   Duke, Jon Garre, PA   azelastine  (ASTELIN ) 0.1 % nasal spray Place 1-2 sprays into both nostrils 2 (two) times daily as needed (nasal drainage). Use in each nostril as directed Patient taking differently: Place 1 spray into both nostrils 2 (two) times daily as needed for allergies or rhinitis (nasal drainage). 04/14/21   Luke Orlan HERO, DO  benzonatate  (TESSALON ) 100 MG capsule Take 1 capsule (100 mg total) by mouth every 8 (eight) hours as needed for cough. 03/08/22   Hazen Darryle BRAVO, FNP  Budeson-Glycopyrrol-Formoterol  (BREZTRI  AEROSPHERE) 160-9-4.8 MCG/ACT AERO Inhale 2 puffs into the lungs in the morning and at bedtime. with spacer and rinse mouth afterwards. 04/14/21   Luke Orlan HERO, DO  budesonide -formoterol  (SYMBICORT ) 80-4.5 MCG/ACT inhaler Inhale 2 puffs into the lungs 2 (two) times daily.    [provider]  calcipotriene (DOVONOX) 0.005 % cream Apply 1 Application topically daily as needed (feet). 04/08/20   [provider]  carvedilol  (COREG ) 12.5 MG tablet Take 6.25 mg by mouth 2 (two) times daily with a meal.  11/09/19   [provider]  Cellulose Carmellose Sodium POWD Apply 1 application topically daily as needed (irritation). Apply to feet    [provider]  Cholecalciferol  (VITAMIN D -3 PO) Take 1 capsule by mouth daily.    [provider]  clobetasol  ointment (TEMOVATE ) 0.05 % Apply 1 application  topically 2 (two) times daily as needed (rash). 11/13/19   [provider]  cyclobenzaprine  (FLEXERIL ) 5 MG tablet Take 1 tablet (5 mg total) by mouth at bedtime as needed for muscle spasms. 08/17/22   Arloa Suzen RAMAN, NP  doxazosin (CARDURA) 1 MG tablet Take 1 mg by mouth daily.    [provider]  EPINEPHrine  0.3 mg/0.3 mL IJ SOAJ injection Inject 0.3 mLs (0.3 mg total) into the muscle as needed for anaphylaxis. 11/16/19   Jeneal Danita Macintosh, MD  EYLEA 2 MG/0.05ML SOLN Place into the left eye every 30 (thirty) days. INJECTION 04/17/19   [provider]  Fluocinolone  Acetonide Body 0.01 % OIL Apply 1 application  topically daily as needed (dry skin). 11/09/19   [provider]  fluticasone  (FLONASE ) 50 MCG/ACT nasal spray Place 1 spray into both nostrils 2 (two) times daily as needed (nasal congestion). 04/14/21   Luke Orlan HERO, DO  furosemide  (LASIX ) 20 MG tablet Take 20 mg by mouth daily. 02/02/20   [provider]  ketoconazole (NIZORAL) 2 % shampoo Apply 1 application  topically every Monday, Wednesday, and Friday.    [provider]  metFORMIN  (GLUCOPHAGE ) 500 MG tablet Take 250 mg by mouth 2 (two) times daily with a meal.  [provider]  methocarbamol  (ROBAXIN ) 500 MG tablet Take 1.5 tablets (750 mg total) by mouth every 6 (six) hours as needed for up to 30 doses for muscle spasms (Neck Stiffness). 07/14/21   Christobal Guadalajara, MD  montelukast  (SINGULAIR ) 10 MG tablet Take 1 tablet (10 mg total) by mouth at bedtime. 08/28/20   Luke Orlan HERO, DO  Multiple Vitamins-Minerals (MULTIVITAMIN & MINERAL PO) Take 1 tablet by mouth daily. Reported on 06/17/2015    [provider]  omeprazole (PRILOSEC) 20 MG capsule Take 20 mg by mouth daily.    [provider]  OneTouch Delica Lancets 33G MISC daily. 09/01/19   [provider]  [facility] ULTRA test strip as needed.  09/01/19   [provider]  OXYGEN  Inhale 2 L into the lungs continuous.    [provider]  potassium chloride  (KLOR-CON ) 10 MEQ tablet Take 2 tablets (20 mEq total) by mouth daily for 7 days. 08/29/21 09/05/21  Trine Raynell Moder, MD  Respiratory Therapy Supplies (FLUTTER) DEVI Use as directed 04/20/16   Bobbitt, Elgin Pepper, MD  rosuvastatin  (CRESTOR ) 20 MG tablet Take 10 mg by mouth at bedtime. 08/11/21   [provider]  sertraline  (ZOLOFT ) 100 MG tablet Take 100 mg by mouth daily. 04/05/19   [provider]  simethicone  (MYLICON) 80 MG chewable tablet Chew 80 mg by mouth daily.    [provider]                                                                                 Vitals:   10/14/23 1300 10/14/23 1345 10/14/23 1429 10/14/23 1756  BP: (!) 164/96 (!) 162/68 (!) 171/67 (!) 167/62  Pulse: 63 70 62 64  Resp: 17 18 17    Temp:   97.8 F (36.6 C)   TempSrc:   Oral   SpO2: 100% 100% 100%   Weight:      Height:       Physical Exam Vitals reviewed.  Constitutional:      General: He is not in acute distress.    Appearance: He is not ill-appearing.  HENT:     Head: Normocephalic and atraumatic.  Eyes:     Extraocular Movements: Extraocular movements intact.  Cardiovascular:     Rate and Rhythm: Normal rate and regular rhythm.     Heart sounds: Normal heart sounds.  Pulmonary:     Breath sounds: Normal breath sounds.  Abdominal:     General: There is no distension.     Palpations: Abdomen is soft.     Tenderness: There is no abdominal tenderness.  Neurological:     General: No focal deficit present.     Mental Status: He is alert and oriented to person, place, and time.     Labs on Admission: I have personally reviewed following labs and imaging studies CBC: Recent Labs  Lab 10/14/23 0944 10/14/23 1628  WBC 2.1*  --   NEUTROABS 1.0*  --   HGB 8.5* 9.0*  HCT 26.9* 28.1*  MCV 105.1*  --   PLT 151  --    Basic Metabolic Panel: Recent Labs  Lab 10/14/23 0944  NA 140  K 4.3  CL 105  CO2 32  GLUCOSE 96  BUN 22  CREATININE 1.24  CALCIUM  9.6   GFR: Estimated Creatinine Clearance: 47.4 mL/min (by C-G formula based on SCr of 1.24 mg/dL). Liver Function Tests: Recent Labs  Lab 10/14/23 0944  AST 19  ALT 19  ALKPHOS 39  BILITOT 0.4  PROT 6.1*  ALBUMIN  3.5   No results for input(s): LIPASE, AMYLASE in the last 168 hours. No results for input(s): AMMONIA in the last 168 hours. Recent Labs    10/14/23 0944  BUN 22  CREATININE 1.24    Estimated Creatinine Clearance: 47.4 mL/min (by C-G formula based on SCr of 1.24  mg/dL).   Recent Labs    10/14/23 0944  BUN 22  CREATININE 1.24  CO2 32   Cardiac Enzymes: No results for input(s): CKTOTAL, CKMB, CKMBINDEX, TROPONINI in the last 168 hours. BNP (last 3 results) No results for input(s): PROBNP in the last 8760 hours. HbA1C: No results for input(s): HGBA1C in the last 72 hours. CBG: Recent Labs  Lab 10/14/23 1441  GLUCAP 87   Lipid Profile: No results for input(s): CHOL, HDL, LDLCALC, TRIG, CHOLHDL, LDLDIRECT in the last 72 hours. Thyroid  Function Tests: No results for input(s): TSH, T4TOTAL, FREET4, T3FREE, THYROIDAB in the last 72 hours. Anemia Panel: Recent Labs    10/14/23 1518 10/14/23 1628  VITAMINB12 300  --   FOLATE 17.5  --   FERRITIN 144  --   TIBC 269  --   IRON 69  --   RETICCTPCT  --  1.2   Urine analysis:    Component Value Date/Time   COLORURINE AMBER (A) 08/29/2021 0613   APPEARANCEUR HAZY (A) 08/29/2021 0613   LABSPEC 1.020 08/29/2021 0613   PHURINE 5.0 08/29/2021 0613   GLUCOSEU NEGATIVE 08/29/2021 0613   HGBUR NEGATIVE 08/29/2021 0613   BILIRUBINUR NEGATIVE 08/29/2021 0613   KETONESUR NEGATIVE 08/29/2021 0613   PROTEINUR 30 (A) 08/29/2021 0613   NITRITE NEGATIVE 08/29/2021 0613   LEUKOCYTESUR NEGATIVE 08/29/2021 9386   Radiological Exams on Admission: No results found. Data Reviewed: Relevant notes from primary care and specialist visits, past discharge summaries as available in EHR, including Care Everywhere . Prior diagnostic testing as pertinent to current admission diagnoses, Updated medications and problem lists for reconciliation .ED course, including vitals, labs, imaging, treatment and response to treatment,Triage notes, nursing and pharmacy notes and ED provider's notes.Notable results as noted in HPI.Discussed case with EDMD/ ED APP/ or Specialty MD on call and as needed.  Assessment & Plan  >> Abdominal pain/bright red blood per rectum: -Will obtain a CT  imaging of the abdomen and pelvis with contrast to identify any structural abnormalities. -H&H is currently stable although abnormal we will follow every 6 hours-type and screen transfuse as deemed appropriate. -GI consulted and are on board and we appreciate management and consult. - IV PPI, n.p.o. after midnight, aspiration precaution. - Hold patient's aspirin  81.  >> Macrocytosis/ABLA:    Latest Ref Rng & Units 10/14/2023    4:28 PM 10/14/2023    9:44 AM 08/28/2021    8:00 PM  CBC  WBC 4.0 - 10.5 K/uL  2.1  12.4   Hemoglobin 13.0 - 17.0 g/dL 9.0  8.5  86.8   Hematocrit 39.0 - 52.0 % 28.1  26.9  39.2   Platelets 150 - 400 K/uL  151  212   Type/Screen/transfuse as deemed appropriate. IV PPI. Anemia panel is within normal limits patient does have  microcytosis and will check TFTs B12 and folate.  >> GERD/peptic ulcer disease: IV PPI and aspiration precaution.  >> Diabetes mellitus type 2: Glycemic protocol and a carb consistent diet postprocedure until then he may have clear liquid diet and then n.p.o. after midnight.  Accu-Cheks every 4 hourly.  >> Essential hypertension: Vitals:   10/14/23 0913 10/14/23 1221 10/14/23 1300 10/14/23 1345  BP: (!) 176/70 (!) 161/68 (!) 164/96 (!) 162/68   10/14/23 1429 10/14/23 1756  BP: (!) 171/67 (!) 167/62  Home medications include amlodipine  10 mg, Coreg  6.25 mg, Lasix  20 mg, Will continue patient on amlodipine .  Add as needed hydralazine .  >> Asthma - COPD overlap syndrome: Continue patient's budesonide  and as needed albuterol .  >> OSA on CPAP: CPAP per home settings.  DVT prophylaxis:  SCDs Consults:  [facility]  Advance Care Planning:    Code Status: Full Code   Family Communication:  Wife Disposition Plan:  Home Severity of Illness: The appropriate patient status for this patient is INPATIENT. Inpatient status is judged to be reasonable and necessary in order to provide the required intensity of service to ensure the patient's  safety. The patient's presenting symptoms, physical exam findings, and initial radiographic and laboratory data in the context of their chronic comorbidities is felt to place them at high risk for further clinical deterioration. Furthermore, it is not anticipated that the patient will be medically stable for discharge from the hospital within 2 midnights of admission.   * I certify that at the point of admission it is my clinical judgment that the patient will require inpatient hospital care spanning beyond 2 midnights from the point of admission due to high intensity of service, high risk for further deterioration and high frequency of surveillance required.*  Unresulted Labs (From admission, onward)     Start     Ordered   10/14/23 1754  T4, free  Add-on,   AD        10/14/23 1753   10/14/23 1746  TSH  Add-on,   AD        10/14/23 1746   10/14/23 1353  Hemoglobin and hematocrit, blood  STAT Now Then every 6 Hours,   R      10/14/23 1352            Meds ordered this encounter  Medications   albuterol  (PROVENTIL ) (2.5 MG/3ML) 0.083% nebulizer solution 2.5 mg   amLODipine  (NORVASC ) tablet 10 mg   benzonatate  (TESSALON ) capsule 100 mg   budesonide -glycopyrrolate-formoterol  (BREZTRI ) 160-9-4.8 MCG/ACT inhaler 2 puff   carvedilol  (COREG ) tablet 6.25 mg   montelukast  (SINGULAIR ) tablet 10 mg   sertraline  (ZOLOFT ) tablet 100 mg   DISCONTD: rosuvastatin  (CRESTOR ) tablet 10 mg   lactated ringers  infusion   OR Linked Order Group    acetaminophen  (TYLENOL ) tablet 650 mg    acetaminophen  (TYLENOL ) suppository 650 mg   OR Linked Order Group    ondansetron  (ZOFRAN ) tablet 4 mg    ondansetron  (ZOFRAN ) injection 4 mg   hydrALAZINE  (APRESOLINE ) injection 5 mg   pantoprazole  (PROTONIX ) injection 40 mg   rosuvastatin  (CRESTOR ) tablet 20 mg   0.9 %  sodium chloride  infusion   FOLLOWED BY Linked Order Group    Na Sulfate-K Sulfate-Mg Sulfate concentrate (SUPREP) kit 177 mL    Na Sulfate-K  Sulfate-Mg Sulfate concentrate (SUPREP) kit 177 mL   FOLLOWED BY Linked Order Group    simethicone  (MYLICON) chewable tablet 240 mg    simethicone  (MYLICON) chewable tablet 240  mg   cyanocobalamin  (VITAMIN B12) injection 1,000 mcg     Orders Placed This Encounter  Procedures   Procedural/ Surgical Case Request: EGD (ESOPHAGOGASTRODUODENOSCOPY), COLONOSCOPY   CT ABDOMEN PELVIS W CONTRAST   Comprehensive metabolic panel   CBC with Differential   Protime-INR   Vitamin B12   Folate   Iron and TIBC   Ferritin   Reticulocytes   Hemoglobin and hematocrit, blood   Glucose, capillary   TSH   T4, free   Diet clear liquid Room service appropriate? Yes; Fluid consistency: Thin   Diet NPO time specified Except for: Sips with Meds   Initiate Carrier Fluid Protocol   SCDs   Cardiac Monitoring Continuous x 24 hours Indications for use: Other; other indications for use: Monitor for ischemia   Vital signs   Notify physician (specify)   Refer to Sidebar Report Mobility Protocol for Adult Inpatient   Initiate Adult Central Line Maintenance and Catheter Protocol for patients with central line (CVC, PICC, Port, Hemodialysis, Trialysis)   If patient diabetic or glucose greater than 140 notify physician for Sliding Scale Insulin  Orders   Intake and Output   Initiate CHG Protocol   Do not place and if present remove PureWick   Initiate Oral Care Protocol   Initiate Carrier Fluid Protocol   RN may order General Admission PRN Orders utilizing General Admission PRN medications (through manage orders) for the following patient needs: allergy symptoms (Claritin ), cold sores (Carmex), cough (Robitussin DM), eye irritation (Liquifilm Tears), hemorrhoids (Tucks), indigestion (Maalox), minor skin irritation (Hydrocortisone Cream), muscle pain Lucienne Gay), nose irritation (saline nasal spray) and sore throat (Chloraseptic spray).   Ambulate with assistance   Notify physician (specify) Please be aware that the  patient may become nauseous and require antiemetics to be given to complete the prep. If none are ordered, please contact the MD   Refer to Sidebar Report Pre-Colonoscopy Nurse Instructions   Print patient instructions   Informed Consent Details: Physician/Practitioner Attestation; Transcribe to consent form and obtain patient signature   Full code   Consult to gastroenterology   Consult to hospitalist   Pulse oximetry check with vital signs   Oxygen  therapy Mode or (Route): Nasal cannula; Liters Per Minute: 2; Keep O2 saturation between: greater than 92 %   CPAP   POC occult blood, ED   EKG 12-Lead   EKG 12-Lead   Type and screen [facility]   ABO/Rh   Insert peripheral IV   Admit to Inpatient (patient's expected length of stay will be greater than 2 midnights or inpatient only procedure)   Aspiration precautions   Fall precautions    Author: Mario LULLA Blanch, MD 12 pm- 8 pm. [facility]. 10/14/2023 5:59 PM Please note for any communication after hours contact [facility] Assigned provider on call on Amion.

## 2023-10-14 NOTE — Consult Note (Addendum)
 Referring Provider: Dr. Mario Blanch Primary Care Physician:  Gerome Brunet, DO Primary Gastroenterologist:  [facility], previously Dr. Teressa   Reason for Consultation:  Anemia   HPI: Douglas Hansen is a 78 y.o. male with a past medical history of arthritis, hypertension, hyperlipidemia, nonobstructive CAD per coronary CT 12/2019, diabetes mellitus type 2, OSA, asthma, COPD on home oxygen  2L [place] and GERD.   Patient underwent routine laboratory studies by his PCP 8/5. He was contacted with the results 8/6 and was informed his Hg level was 8.7, dropped 4 points over the past year and he was instructed to go to the ED for further evaluation. Labs in the ED showed a WBC count of 2.1.  Hemoglobin 8.5 (Hg 13.1 two years ago).  Hematocrit 26.9.  MCV 105.1.  Sodium 140.  Potassium 4.3.  BUN 22.  Creatinine 1.24.  Total bili 0.4.  Alk phos 39.  AST 19.  ALT 19.  INR 1.0.  FOBT negative.  Iron panel, B12 and folate levels pending.  GI consult was requested for further evaluation regarding anemia recent bloody stools.  Overall, he reported feeling quite well over the past few months without any noticeable change in his general health status. He has chronic COPD with DOE and is on home oxygen . No worsening fatigue. No chest pain. No nausea or vomiting. He had a coughing episode a week ago and gagged, felt like he might throw up but did not.  He has occasional heartburn for which he takes Omeprazole 20 mg once weekly as needed. No dysphagia. He typically does not experience any abdominal pain, however, one week ago he felt a knot to his LUQ area which was more prominent when passing a BM which lasted for few days then abated 1 or 2 days ago.  He passed a loose almost black stool x 1 episode 4 to 5 days ago and endorsed seeing a small amount of bright red blood on the toilet tissue for 2 to 3 days which stopped a few days ago without recurrence. No associated anorectal pain. He sometimes strains to pass a  BM. He takes ASA 81 mg daily. No other NSAID use. Not on anticoagulation. No alcohol use.  No prior history of anemia.  He underwent an EGD 04/2007 due to having dysphagia which was unremarkable, the esophagus was dilated. His most recent colonoscopy was 12/2014 and 1 polyp was removed from the descending colon, path report showed submucosal lipoma and not a true colon polyp. No known family history of esophageal, gastric or colon cancer.  Daughter with iron deficiency anemia.  ECHO 01/03/2020: 1. Left ventricular ejection fraction, by estimation, is 65 to 70%. The left ventricle has normal function. The left ventricle has no regional wall motion abnormalities. There is moderate to severe left ventricular hypertrophy. Left ventricular diastolic parameters are indeterminate. 2. Right ventricular systolic function is normal. The right ventricular size is normal. 3. The mitral valve is normal in structure. Trivial mitral valve regurgitation. No evidence of mitral stenosis. 4. The aortic valve is tricuspid. There is mild calcification of the aortic valve. Aortic valve regurgitation is not visualized. Mild aortic valve sclerosis is present, with no evidence of aortic valve stenosis. 5. The inferior vena cava is normal in size with <50% respiratory variability, suggesting right atrial pressure of 8 mmHg.  GI PROCEDURES:  Colonoscopy 12/11/2014: Sessile polyp was found in the descending colon; polypectomy was performed with a cold snare Surgical [P], descending, polyp - SUBMUCOSAL LIPOMA. - NO  ADENOMATOUS EPITHELIUM OR MALIGNANCY IDENTIFIED  EGD for dysphagia 04/28/2007: Unremarkable examination with dilatation to 17 [place].  Colonoscopy 05/16/2004: Well examination to the cecum  Past Medical History:  Diagnosis Date   Agent orange exposure    Arthritis    Asthma    Asthma with acute exacerbation 06/17/2015   COPD (chronic obstructive pulmonary disease) (HCC)    Diabetes mellitus    Hyperlipemia     Hypertension    OSA on CPAP    Pneumonia    Pneumonia due to COVID-19 virus 11/22/2019   PTSD (post-traumatic stress disorder)     Past Surgical History:  Procedure Laterality Date   CARDIAC CATHETERIZATION     right thumb surgery     TONSILLECTOMY      Prior to Admission medications   Medication Sig Start Date End Date Taking? Authorizing Provider  albuterol  (PROVENTIL ) (2.5 MG/3ML) 0.083% nebulizer solution Take 3 mLs (2.5 mg total) by nebulization every 4 (four) hours as needed for wheezing or shortness of breath (coughing fits). 04/14/21   Luke Orlan HERO, DO  albuterol  (VENTOLIN  HFA) 108 (90 Base) MCG/ACT inhaler INHALE 2 PUFFS BY MOUTH EVERY 4 HOURS AS NEEDED FOR WHEEZING OR SHORTNESS OF BREATH Patient taking differently: Inhale 2 puffs into the lungs every 4 (four) hours as needed for wheezing or shortness of breath. 08/22/19   Luke Orlan HERO, DO  amLODipine  (NORVASC ) 10 MG tablet Take 10 mg by mouth daily. 04/06/19   [provider]  ascorbic acid  (VITAMIN C ) 500 MG tablet Take 1,000 mg by mouth daily.  04/06/19   [provider]  aspirin  EC 81 MG EC tablet Take 1 tablet (81 mg total) by mouth daily. 07/17/21   Duke, Jon Garre, PA  azelastine  (ASTELIN ) 0.1 % nasal spray Place 1-2 sprays into both nostrils 2 (two) times daily as needed (nasal drainage). Use in each nostril as directed Patient taking differently: Place 1 spray into both nostrils 2 (two) times daily as needed for allergies or rhinitis (nasal drainage). 04/14/21   Luke Orlan HERO, DO  benzonatate  (TESSALON ) 100 MG capsule Take 1 capsule (100 mg total) by mouth every 8 (eight) hours as needed for cough. 03/08/22   Hazen Darryle BRAVO, FNP  Budeson-Glycopyrrol-Formoterol  (BREZTRI  AEROSPHERE) 160-9-4.8 MCG/ACT AERO Inhale 2 puffs into the lungs in the morning and at bedtime. with spacer and rinse mouth afterwards. 04/14/21   Luke Orlan HERO, DO  budesonide -formoterol  (SYMBICORT ) 80-4.5 MCG/ACT inhaler Inhale 2 puffs into the lungs  2 (two) times daily.    [provider]  calcipotriene (DOVONOX) 0.005 % cream Apply 1 Application topically daily as needed (feet). 04/08/20   [provider]  carvedilol  (COREG ) 12.5 MG tablet Take 6.25 mg by mouth 2 (two) times daily with a meal.  11/09/19   [provider]  Cellulose Carmellose Sodium POWD Apply 1 application topically daily as needed (irritation). Apply to feet    [provider]  Cholecalciferol  (VITAMIN D -3 PO) Take 1 capsule by mouth daily.    [provider]  clobetasol  ointment (TEMOVATE ) 0.05 % Apply 1 application  topically 2 (two) times daily as needed (rash). 11/13/19   [provider]  cyclobenzaprine  (FLEXERIL ) 5 MG tablet Take 1 tablet (5 mg total) by mouth at bedtime as needed for muscle spasms. 08/17/22   Arloa Suzen RAMAN, NP  doxazosin (CARDURA) 1 MG tablet Take 1 mg by mouth daily.    [provider]  EPINEPHrine  0.3 mg/0.3 mL IJ SOAJ injection  Inject 0.3 mLs (0.3 mg total) into the muscle as needed for anaphylaxis. 11/16/19   Jeneal Danita Macintosh, MD  EYLEA 2 MG/0.05ML SOLN Place into the left eye every 30 (thirty) days. INJECTION 04/17/19   [provider]  Fluocinolone  Acetonide Body 0.01 % OIL Apply 1 application  topically daily as needed (dry skin). 11/09/19   [provider]  fluticasone  (FLONASE ) 50 MCG/ACT nasal spray Place 1 spray into both nostrils 2 (two) times daily as needed (nasal congestion). 04/14/21   Luke Orlan HERO, DO  furosemide  (LASIX ) 20 MG tablet Take 20 mg by mouth daily. 02/02/20   [provider]  ketoconazole (NIZORAL) 2 % shampoo Apply 1 application  topically every Monday, Wednesday, and Friday.    [provider]  metFORMIN  (GLUCOPHAGE ) 500 MG tablet Take 250 mg by mouth 2 (two) times daily with a meal.    [provider]  methocarbamol  (ROBAXIN ) 500 MG tablet Take 1.5 tablets (750 mg total) by mouth every 6 (six) hours as needed for up  to 30 doses for muscle spasms (Neck Stiffness). 07/14/21   Christobal Guadalajara, MD  montelukast  (SINGULAIR ) 10 MG tablet Take 1 tablet (10 mg total) by mouth at bedtime. 08/28/20   Luke Orlan HERO, DO  Multiple Vitamins-Minerals (MULTIVITAMIN & MINERAL PO) Take 1 tablet by mouth daily. Reported on 06/17/2015    [provider]  omeprazole (PRILOSEC) 20 MG capsule Take 20 mg by mouth daily.    [provider]  OneTouch Delica Lancets 33G MISC daily. 09/01/19   [provider]  [facility] ULTRA test strip as needed.  09/01/19   [provider]  OXYGEN  Inhale 2 L into the lungs continuous.    [provider]  potassium chloride  (KLOR-CON ) 10 MEQ tablet Take 2 tablets (20 mEq total) by mouth daily for 7 days. 08/29/21 09/05/21  Trine Raynell Moder, MD  Respiratory Therapy Supplies (FLUTTER) DEVI Use as directed 04/20/16   Bobbitt, Elgin Pepper, MD  rosuvastatin  (CRESTOR ) 20 MG tablet Take 10 mg by mouth at bedtime. 08/11/21   [provider]  sertraline  (ZOLOFT ) 100 MG tablet Take 100 mg by mouth daily. 04/05/19   [provider]  simethicone  (MYLICON) 80 MG chewable tablet Chew 80 mg by mouth daily.    [provider]    Current Facility-Administered Medications  Medication Dose Route Frequency Provider Last Rate Last Admin   acetaminophen  (TYLENOL ) tablet 650 mg  650 mg Oral Q6H PRN Patel, Ekta V, MD       Or   acetaminophen  (TYLENOL ) suppository 650 mg  650 mg Rectal Q6H PRN Patel, Ekta V, MD       albuterol  (PROVENTIL ) (2.5 MG/3ML) 0.083% nebulizer solution 2.5 mg  2.5 mg Nebulization Q4H PRN Patel, Ekta V, MD       amLODipine  (NORVASC ) tablet 10 mg  10 mg Oral Daily Tobie Mario GAILS, MD       Benralizumab  SOSY 30 mg  30 mg Subcutaneous Q8 Weeks Kim, Yoon M, DO   30 mg at 12/31/20 1024   benzonatate  (TESSALON ) capsule 100 mg  100 mg Oral Q8H PRN Patel, Ekta V, MD       budesonide -glycopyrrolate-formoterol  (BREZTRI ) 160-9-4.8 MCG/ACT inhaler 2 puff   2 puff Inhalation Q12H Tobie Mario GAILS, MD       carvedilol  (COREG ) tablet 6.25 mg  6.25 mg Oral BID WC Patel, Ekta V, MD       hydrALAZINE  (APRESOLINE ) injection 5 mg  5 mg  Intravenous Q4H PRN Tobie Mario GAILS, MD       lactated ringers  infusion   Intravenous Continuous Tobie Mario GAILS, MD       montelukast  (SINGULAIR ) tablet 10 mg  10 mg Oral QHS Patel, Ekta V, MD       ondansetron  (ZOFRAN ) tablet 4 mg  4 mg Oral Q6H PRN Patel, Ekta V, MD       Or   ondansetron  (ZOFRAN ) injection 4 mg  4 mg Intravenous Q6H PRN Patel, Ekta V, MD       pantoprazole  (PROTONIX ) injection 40 mg  40 mg Intravenous Q12H Tobie Mario GAILS, MD       rosuvastatin  (CRESTOR ) tablet 10 mg  10 mg Oral QHS Patel, Ekta V, MD       [START ON 10/15/2023] sertraline  (ZOLOFT ) tablet 100 mg  100 mg Oral Daily Tobie Mario GAILS, MD       tezepelumab -ekko (TEZSPIRE ) 210 MG/1. syringe 210 mg  210 mg Subcutaneous Q28 days Luke Orlan HERO, DO   210 mg at 08/05/21 1345   Current Outpatient Medications  Medication Sig Dispense Refill   albuterol  (PROVENTIL ) (2.5 MG/3ML) 0.083% nebulizer solution Take 3 mLs (2.5 mg total) by nebulization every 4 (four) hours as needed for wheezing or shortness of breath (coughing fits). 75 mL 1   albuterol  (VENTOLIN  HFA) 108 (90 Base) MCG/ACT inhaler INHALE 2 PUFFS BY MOUTH EVERY 4 HOURS AS NEEDED FOR WHEEZING OR SHORTNESS OF BREATH (Patient taking differently: Inhale 2 puffs into the lungs every 4 (four) hours as needed for wheezing or shortness of breath.) 42.5 g 0   amLODipine  (NORVASC ) 10 MG tablet Take 10 mg by mouth daily.     ascorbic acid  (VITAMIN C ) 500 MG tablet Take 1,000 mg by mouth daily.      aspirin  EC 81 MG EC tablet Take 1 tablet (81 mg total) by mouth daily. 90 tablet 3   azelastine  (ASTELIN ) 0.1 % nasal spray Place 1-2 sprays into both nostrils 2 (two) times daily as needed (nasal drainage). Use in each nostril as directed (Patient taking differently: Place 1 spray into both nostrils 2 (two) times daily  as needed for allergies or rhinitis (nasal drainage).) 30 mL 5   benzonatate  (TESSALON ) 100 MG capsule Take 1 capsule (100 mg total) by mouth every 8 (eight) hours as needed for cough. 21 capsule 0   Budeson-Glycopyrrol-Formoterol  (BREZTRI  AEROSPHERE) 160-9-4.8 MCG/ACT AERO Inhale 2 puffs into the lungs in the morning and at bedtime. with spacer and rinse mouth afterwards. 10.7 g 5   budesonide -formoterol  (SYMBICORT ) 80-4.5 MCG/ACT inhaler Inhale 2 puffs into the lungs 2 (two) times daily.     calcipotriene (DOVONOX) 0.005 % cream Apply 1 Application topically daily as needed (feet).     carvedilol  (COREG ) 12.5 MG tablet Take 6.25 mg by mouth 2 (two) times daily with a meal.      Cellulose Carmellose Sodium POWD Apply 1 application topically daily as needed (irritation). Apply to feet     Cholecalciferol  (VITAMIN D -3 PO) Take 1 capsule by mouth daily.     clobetasol  ointment (TEMOVATE ) 0.05 % Apply 1 application  topically 2 (two) times daily as needed (rash).     cyclobenzaprine  (FLEXERIL ) 5 MG tablet Take 1 tablet (5 mg total) by mouth at bedtime as needed for muscle spasms. 14 tablet 0   doxazosin (CARDURA) 1 MG tablet Take 1 mg by mouth daily.     EPINEPHrine  0.3 mg/0.3 mL IJ SOAJ injection Inject 0.3  mLs (0.3 mg total) into the muscle as needed for anaphylaxis. 2 each 1   EYLEA 2 MG/0.05ML SOLN Place into the left eye every 30 (thirty) days. INJECTION     Fluocinolone  Acetonide Body 0.01 % OIL Apply 1 application  topically daily as needed (dry skin).     fluticasone  (FLONASE ) 50 MCG/ACT nasal spray Place 1 spray into both nostrils 2 (two) times daily as needed (nasal congestion). 16 g 5   furosemide  (LASIX ) 20 MG tablet Take 20 mg by mouth daily.     ketoconazole (NIZORAL) 2 % shampoo Apply 1 application  topically every Monday, Wednesday, and Friday.     metFORMIN  (GLUCOPHAGE ) 500 MG tablet Take 250 mg by mouth 2 (two) times daily with a meal.     methocarbamol  (ROBAXIN ) 500 MG tablet Take  1.5 tablets (750 mg total) by mouth every 6 (six) hours as needed for up to 30 doses for muscle spasms (Neck Stiffness). 30 tablet 0   montelukast  (SINGULAIR ) 10 MG tablet Take 1 tablet (10 mg total) by mouth at bedtime. 30 tablet 5   Multiple Vitamins-Minerals (MULTIVITAMIN & MINERAL PO) Take 1 tablet by mouth daily. Reported on 06/17/2015     omeprazole (PRILOSEC) 20 MG capsule Take 20 mg by mouth daily.     OneTouch Delica Lancets 33G MISC daily.     ONETOUCH ULTRA test strip as needed.      OXYGEN  Inhale 2 L into the lungs continuous.     potassium chloride  (KLOR-CON ) 10 MEQ tablet Take 2 tablets (20 mEq total) by mouth daily for 7 days. 14 tablet 0   Respiratory Therapy Supplies (FLUTTER) DEVI Use as directed 1 each 0   rosuvastatin  (CRESTOR ) 20 MG tablet Take 10 mg by mouth at bedtime.     sertraline  (ZOLOFT ) 100 MG tablet Take 100 mg by mouth daily.     simethicone  (MYLICON) 80 MG chewable tablet Chew 80 mg by mouth daily.      Allergies as of 10/14/2023 - Review Complete 10/14/2023  Allergen Reaction Noted   Codeine Nausea Only and Other (See Comments) 09/13/2009   Lipitor [atorvastatin] Other (See Comments) 10/27/2014   Pravachol [pravastatin sodium] Other (See Comments) 10/27/2014    Family History  Problem Relation Age of Onset   Emphysema Father    Asthma Father    Heart disease Father    Heart attack Father    Stroke Mother    Heart attack Brother    Diabetes Brother    Heart attack Brother    Diabetes Brother    Colon cancer Neg Hx     Social History   Socioeconomic History   Marital status: Widowed    Spouse name: Not on file   Number of children: 5   Years of education: Not on file   Highest education level: Not on file  Occupational History   Occupation: Retired  Tobacco Use   Smoking status: Former    Current packs/day: 0.00    Average packs/day: 1 pack/day for 20.0 years (20.0 ttl pk-yrs)    Types: Cigarettes    Start date: 03/09/1965    Quit date:  03/09/1985    Years since quitting: 38.6   Smokeless tobacco: Never  Vaping Use   Vaping status: Never Used  Substance and Sexual Activity   Alcohol use: No   Drug use: No   Sexual activity: Not on file  Other Topics Concern   Not on file  Social History Narrative   **  Merged History Encounter **       Epworth Sleepiness Scale = 6 (as of 04/17/2015)   Social Drivers of Corporate investment banker Strain: Not on file  Food Insecurity: Not on file  Transportation Needs: Not on file  Physical Activity: Not on file  Stress: Not on file  Social Connections: Not on file  Intimate Partner Violence: Not on file   Review of Systems: Gen: Denies fever, sweats or chills. No weight loss.  CV: Denies chest pain, palpitations or edema. Resp:See HPI. GI: See HPI. GU : Denies urinary burning, blood in urine, increased urinary frequency or incontinence. MS: Denies joint pain, muscles aches or weakness. Derm: Denies rash, itchiness, skin lesions or unhealing ulcers. Psych: Denies depression, anxiety, memory loss or confusion. Heme: Denies easy bruising, bleeding. Neuro:  Denies headaches, dizziness or paresthesias. Endo: + DM type II.  Physical Exam: Vital signs in last 24 hours: Temp:  [97.8 F (36.6 C)-98.8 F (37.1 C)] 97.8 F (36.6 C) (08/07 1221) Pulse Rate:  [65-71] 65 (08/07 1221) Resp:  [14-18] 18 (08/07 1221) BP: (161-176)/(68-70) 161/68 (08/07 1221) SpO2:  [100 %] 100 % (08/07 1221) Weight:  [80.7 kg] 80.7 kg (08/07 0912)   General: Alert 78 year old male in no acute distress. Head: Normocephalic and atraumatic. Eyes: No scleral icterus. Conjunctiva pink. Ears: Normal auditory acuity. Nose: No deformity, discharge or lesions. Mouth: Dentition intact. No ulcers or lesions.  Neck:  Supple. No lymphadenopathy or thyromegaly.  Lungs: Breath sounds clear throughout. No wheezes, rhonchi or crackles.  On oxygen  2 L nasal cannula. Heart: Rate and rhythm, no murmurs. Abdomen:  Protuberant, soft.  Nontender.  Positive bowel sounds all 4 quadrants.  No palpable mass.  No hepatosplenomegaly. Rectal: Patient declined rectal exam,  ED physician performed rectal exam which showed external hemorrhoids, no overt blood in the rectum, FOBT negative. Musculoskeletal: Symmetrical without gross deformities.  Pulses:  Normal pulses noted. Extremities:  Without clubbing or edema. Neurologic:  Alert and oriented x 4. No focal deficits.  Skin:  Intact without significant lesions or rashes. Psych:  Alert and cooperative. Normal mood and affect.  Intake/Output from previous day: No intake/output data recorded. Intake/Output this shift: No intake/output data recorded.  Lab Results: Recent Labs    10/14/23 0944  WBC 2.1*  HGB 8.5*  HCT 26.9*  PLT 151   BMET Recent Labs    10/14/23 0944  NA 140  K 4.3  CL 105  CO2 32  GLUCOSE 96  BUN 22  CREATININE 1.24  CALCIUM  9.6   LFT Recent Labs    10/14/23 0944  PROT 6.1*  ALBUMIN  3.5  AST 19  ALT 19  ALKPHOS 39  BILITOT 0.4   PT/INR Recent Labs    10/14/23 0944  LABPROT 13.8  INR 1.0   Hepatitis Panel No results for input(s): HEPBSAG, HCVAB, HEPAIGM, HEPBIGM in the last 72 hours.    Studies/Results: No results found.  IMPRESSION/PLAN:  78 year-old male admitted with acute macrocytic anemia. Iron and B12 levels pending. Admission Hg 8.5 ( Hg 13.1 two years ago).  Within the past week, patient passed 1 loose almost black stool x 1 and had bright red blood per the rectum x 2 to 3 days.  No significant abdominal pain, noted having a recent  knot to the LUQ area which lasted for a few days then abated.  Hemodynamically stable. - Await iron, ferritin, B12 and folate levels - Clear liquid diet  - EGD and colonoscopy  benefits and risks discussed including risk with sedation, risk of bleeding, perforation and infection. Timing to be determined. Await further recommendations per Dr. Albertus.  -  Pantoprazole  40 mg p.o. twice daily - CBC in a.m. - Transfuse for hemoglobin level < 8 or as needed if symptomatic  - Consider CTAP with oral and IV contrast to further evaluate recent not sensation to the LUQ and to rule out any intra-abdominal/pelvic pathology to explain his anemia and leukopenia  Leukopenia - CBC in am  Remote History of GERD, dysphagia - See plan above   Nonobstructive CAD per coronary CT 12/2019. No angina.   COPD on home oxygen  2L [place]  DM type II   Elida CHRISTELLA Shawl  10/14/2023, 2:24 PM

## 2023-10-14 NOTE — Progress Notes (Signed)
 Pt placed on tele box [phone]

## 2023-10-14 NOTE — Progress Notes (Signed)
 Phlebotomy attempted to get blood. Was unsuccessful. Stated they would try again later

## 2023-10-14 NOTE — ED Triage Notes (Signed)
 Pt states PCP called pt that hbg was 4. Pt states he has been having red and black stools that started 3 weeks ago. Pt is on 2L of O2 at all times. Denies CP and shob. C/O HA no dizziness.

## 2023-10-14 NOTE — Progress Notes (Signed)
 EKG completed

## 2023-10-14 NOTE — ED Provider Notes (Signed)
 [place] EMERGENCY DEPARTMENT AT [facility] Provider Note   CSN: [id number] Arrival date & time: 10/14/23  9097     Patient presents with: Hypotension and Abnormal Lab   Douglas Hansen is a 78 y.o. male.  {Add pertinent medical, surgical, social history, OB history to HPI:32947}  Abnormal Lab    Patient has a history of hypertension diabetes hyperlipidemia COPD, asthma.  Patient states he has noticed some intermittent blood in his stool over the last few weeks.  Patient states he has not noticed any of the last several days.  Patient went to the [place] for evaluation.  He had laboratory tests drawn.  Patient states his hemoglobin was lower than it had been.  He was instructed to come to the ED for further evaluation.  Patient denies any trouble with abdominal pain.  He is not having any vomiting.  No chest pain no shortness of breath.  Prior to Admission medications   Medication Sig Start Date End Date Taking? Authorizing Provider  albuterol  (PROVENTIL ) (2.5 MG/3ML) 0.083% nebulizer solution Take 3 mLs (2.5 mg total) by nebulization every 4 (four) hours as needed for wheezing or shortness of breath (coughing fits). 04/14/21   Luke Orlan HERO, DO  albuterol  (VENTOLIN  HFA) 108 (90 Base) MCG/ACT inhaler INHALE 2 PUFFS BY MOUTH EVERY 4 HOURS AS NEEDED FOR WHEEZING OR SHORTNESS OF BREATH Patient taking differently: Inhale 2 puffs into the lungs every 4 (four) hours as needed for wheezing or shortness of breath. 08/22/19   Luke Orlan HERO, DO  amLODipine  (NORVASC ) 10 MG tablet Take 10 mg by mouth daily. 04/06/19   [provider]  ascorbic acid  (VITAMIN C ) 500 MG tablet Take 1,000 mg by mouth daily.  04/06/19   [provider]  aspirin  EC 81 MG EC tablet Take 1 tablet (81 mg total) by mouth daily. 07/17/21   [facility], Taydon Nasworthy Garre, PA  azelastine  (ASTELIN ) 0.1 % nasal spray Place 1-2 sprays into both nostrils 2 (two) times daily as needed (nasal drainage). Use in each nostril as  directed Patient taking differently: Place 1 spray into both nostrils 2 (two) times daily as needed for allergies or rhinitis (nasal drainage). 04/14/21   Luke Orlan HERO, DO  benzonatate  (TESSALON ) 100 MG capsule Take 1 capsule (100 mg total) by mouth every 8 (eight) hours as needed for cough. 03/08/22   Hazen Darryle BRAVO, FNP  Budeson-Glycopyrrol-Formoterol  (BREZTRI  AEROSPHERE) 160-9-4.8 MCG/ACT AERO Inhale 2 puffs into the lungs in the morning and at bedtime. with spacer and rinse mouth afterwards. 04/14/21   Luke Orlan HERO, DO  budesonide -formoterol  (SYMBICORT ) 80-4.5 MCG/ACT inhaler Inhale 2 puffs into the lungs 2 (two) times daily.    [provider]  calcipotriene (DOVONOX) 0.005 % cream Apply 1 Application topically daily as needed (feet). 04/08/20   [provider]  carvedilol  (COREG ) 12.5 MG tablet Take 6.25 mg by mouth 2 (two) times daily with a meal.  11/09/19   [provider]  Cellulose Carmellose Sodium POWD Apply 1 application topically daily as needed (irritation). Apply to feet    [provider]  Cholecalciferol  (VITAMIN D -3 PO) Take 1 capsule by mouth daily.    [provider]  clobetasol  ointment (TEMOVATE ) 0.05 % Apply 1 application  topically 2 (two) times daily as needed (rash). 11/13/19   [provider]  cyclobenzaprine  (FLEXERIL ) 5 MG tablet Take 1 tablet (5 mg total) by mouth at bedtime as needed for muscle spasms. 08/17/22   Arloa Suzen RAMAN,  NP  doxazosin (CARDURA) 1 MG tablet Take 1 mg by mouth daily.    [provider]  EPINEPHrine  0.3 mg/0.3 mL IJ SOAJ injection Inject 0.3 mLs (0.3 mg total) into the muscle as needed for anaphylaxis. 11/16/19   Jeneal Danita Macintosh, MD  EYLEA 2 MG/0.05ML SOLN Place into the left eye every 30 (thirty) days. INJECTION 04/17/19   [provider]  Fluocinolone  Acetonide Body 0.01 % OIL Apply 1 application  topically daily as needed (dry skin). 11/09/19   [provider]   fluticasone  (FLONASE ) 50 MCG/ACT nasal spray Place 1 spray into both nostrils 2 (two) times daily as needed (nasal congestion). 04/14/21   Luke Orlan HERO, DO  furosemide  (LASIX ) 20 MG tablet Take 20 mg by mouth daily. 02/02/20   [provider]  ketoconazole (NIZORAL) 2 % shampoo Apply 1 application  topically every Monday, Wednesday, and Friday.    [provider]  metFORMIN  (GLUCOPHAGE ) 500 MG tablet Take 250 mg by mouth 2 (two) times daily with a meal.    [provider]  methocarbamol  (ROBAXIN ) 500 MG tablet Take 1.5 tablets (750 mg total) by mouth every 6 (six) hours as needed for up to 30 doses for muscle spasms (Neck Stiffness). 07/14/21   Christobal Guadalajara, MD  montelukast  (SINGULAIR ) 10 MG tablet Take 1 tablet (10 mg total) by mouth at bedtime. 08/28/20   Luke Orlan HERO, DO  Multiple Vitamins-Minerals (MULTIVITAMIN & MINERAL PO) Take 1 tablet by mouth daily. Reported on 06/17/2015    [provider]  omeprazole (PRILOSEC) 20 MG capsule Take 20 mg by mouth daily.    [provider]  OneTouch Delica Lancets 33G MISC daily. 09/01/19   [provider]  [facility] ULTRA test strip as needed.  09/01/19   [provider]  OXYGEN  Inhale 2 L into the lungs continuous.    [provider]  potassium chloride  (KLOR-CON ) 10 MEQ tablet Take 2 tablets (20 mEq total) by mouth daily for 7 days. 08/29/21 09/05/21  Trine Raynell Moder, MD  Respiratory Therapy Supplies (FLUTTER) DEVI Use as directed 04/20/16   Bobbitt, Elgin Pepper, MD  rosuvastatin  (CRESTOR ) 20 MG tablet Take 10 mg by mouth at bedtime. 08/11/21   [provider]  sertraline  (ZOLOFT ) 100 MG tablet Take 100 mg by mouth daily. 04/05/19   [provider]  simethicone  (MYLICON) 80 MG chewable tablet Chew 80 mg by mouth daily.    [provider]    Allergies: Codeine, Lipitor [atorvastatin], and Pravachol [pravastatin sodium]    Review of Systems  Updated Vital  Signs BP (!) 176/70   Pulse 71   Temp 98.8 F (37.1 C) (Oral)   Resp 14   Ht 1.676 m (5' 6)   Wt 80.7 kg   SpO2 100%   BMI 28.73 kg/m   Physical Exam Vitals and nursing note reviewed.  Constitutional:      General: He is not in acute distress.    Appearance: He is well-developed.  HENT:     Head: Normocephalic and atraumatic.     Right Ear: External ear normal.     Left Ear: External ear normal.  Eyes:     General: No scleral icterus.       Right eye: No discharge.        Left eye: No discharge.     Conjunctiva/sclera: Conjunctivae normal.  Neck:     Trachea: No tracheal deviation.  Cardiovascular:     Rate and Rhythm:  Normal rate and regular rhythm.  Pulmonary:     Effort: Pulmonary effort is normal. No respiratory distress.     Breath sounds: Normal breath sounds. No stridor. No wheezing or rales.  Abdominal:     General: Bowel sounds are normal. There is no distension.     Palpations: Abdomen is soft.     Tenderness: There is no abdominal tenderness. There is no guarding or rebound.  Genitourinary:    Comments: External hemorrhoids noted, no active bleeding, no gross blood noted on rectal exam Musculoskeletal:        General: No tenderness or deformity.     Cervical back: Neck supple.  Skin:    General: Skin is warm and dry.     Findings: No rash.  Neurological:     General: No focal deficit present.     Mental Status: He is alert.     Cranial Nerves: No cranial nerve deficit, dysarthria or facial asymmetry.     Sensory: No sensory deficit.     Motor: No abnormal muscle tone or seizure activity.     Coordination: Coordination normal.  Psychiatric:        Mood and Affect: Mood normal.     (all labs ordered are listed, but only abnormal results are displayed) Labs Reviewed  COMPREHENSIVE METABOLIC PANEL WITH GFR - Abnormal; Notable for the following components:      Result Value   Total Protein 6.1 (*)    GFR, Estimated 59 (*)    Anion gap 3 (*)     All other components within normal limits  CBC WITH DIFFERENTIAL/PLATELET - Abnormal; Notable for the following components:   WBC 2.1 (*)    RBC 2.56 (*)    Hemoglobin 8.5 (*)    HCT 26.9 (*)    MCV 105.1 (*)    Neutro Abs 1.0 (*)    All other components within normal limits  PROTIME-INR  POC OCCULT BLOOD, ED  TYPE AND SCREEN  ABO/RH    EKG: None  Radiology: No results found.  {Document cardiac monitor, telemetry assessment procedure when appropriate:32947} Procedures   Medications Ordered in the ED - No data to display  Clinical Course as of 10/14/23 1114  Thu Oct 14, 2023  1033 CBC with Differential(!) Hemoglobin decreased at 8.5.  Hemoccult negative [JK]  1105 Comprehensive metabolic panel(!) Metabolic panel normal [JK]    Clinical Course User Index [JK] Randol Simmonds, MD   {Click here for ABCD2, HEART and other calculators REFRESH Note before signing:1}                              Medical Decision Making Problems Addressed: Anemia, unspecified type: acute illness or injury that poses a threat to life or bodily functions Gastrointestinal hemorrhage, unspecified gastrointestinal hemorrhage type: acute illness or injury that poses a threat to life or bodily functions  Amount and/or Complexity of Data Reviewed Labs: ordered. Decision-making details documented in ED Course.   Patient presented to the ED for evaluation of anemia and recent rectal bleeding.  No signs of active bleeding in the ED right now.  Hemoccult negative.  Patient's hemoglobin however is significantly lower than previous values.  I am concerned patient's anemia is related to GI bleeding.  With his age comorbidities significant drop in hemoglobin I will consult the medical service for admission.  Will also consult with gastroenterology.  {Document critical care time when appropriate  Document review  of labs and clinical decision tools ie CHADS2VASC2, etc  Document your independent review of  radiology images and any outside records  Document your discussion with family members, caretakers and with consultants  Document social determinants of health affecting pt's care  Document your decision making why or why not admission, treatments were needed:32947:::1}   Final diagnoses:  Anemia, unspecified type  Gastrointestinal hemorrhage, unspecified gastrointestinal hemorrhage type    ED Discharge Orders     None

## 2023-10-14 NOTE — Hospital Course (Signed)
 Follow up on h/h and EKG

## 2023-10-14 NOTE — H&P (View-Only) (Signed)
 Referring Provider: Dr. Mario Blanch Primary Care Physician:  Gerome Brunet, DO Primary Gastroenterologist:  [facility], previously Dr. Teressa   Reason for Consultation:  Anemia   HPI: Douglas Hansen is a 78 y.o. male with a past medical history of arthritis, hypertension, hyperlipidemia, nonobstructive CAD per coronary CT 12/2019, diabetes mellitus type 2, OSA, asthma, COPD on home oxygen  2L [place] and GERD.   Patient underwent routine laboratory studies by his PCP 8/5. He was contacted with the results 8/6 and was informed his Hg level was 8.7, dropped 4 points over the past year and he was instructed to go to the ED for further evaluation. Labs in the ED showed a WBC count of 2.1.  Hemoglobin 8.5 (Hg 13.1 two years ago).  Hematocrit 26.9.  MCV 105.1.  Sodium 140.  Potassium 4.3.  BUN 22.  Creatinine 1.24.  Total bili 0.4.  Alk phos 39.  AST 19.  ALT 19.  INR 1.0.  FOBT negative.  Iron panel, B12 and folate levels pending.  GI consult was requested for further evaluation regarding anemia recent bloody stools.  Overall, he reported feeling quite well over the past few months without any noticeable change in his general health status. He has chronic COPD with DOE and is on home oxygen . No worsening fatigue. No chest pain. No nausea or vomiting. He had a coughing episode a week ago and gagged, felt like he might throw up but did not.  He has occasional heartburn for which he takes Omeprazole 20 mg once weekly as needed. No dysphagia. He typically does not experience any abdominal pain, however, one week ago he felt a knot to his LUQ area which was more prominent when passing a BM which lasted for few days then abated 1 or 2 days ago.  He passed a loose almost black stool x 1 episode 4 to 5 days ago and endorsed seeing a small amount of bright red blood on the toilet tissue for 2 to 3 days which stopped a few days ago without recurrence. No associated anorectal pain. He sometimes strains to pass a  BM. He takes ASA 81 mg daily. No other NSAID use. Not on anticoagulation. No alcohol use.  No prior history of anemia.  He underwent an EGD 04/2007 due to having dysphagia which was unremarkable, the esophagus was dilated. His most recent colonoscopy was 12/2014 and 1 polyp was removed from the descending colon, path report showed submucosal lipoma and not a true colon polyp. No known family history of esophageal, gastric or colon cancer.  Daughter with iron deficiency anemia.  ECHO 01/03/2020: 1. Left ventricular ejection fraction, by estimation, is 65 to 70%. The left ventricle has normal function. The left ventricle has no regional wall motion abnormalities. There is moderate to severe left ventricular hypertrophy. Left ventricular diastolic parameters are indeterminate. 2. Right ventricular systolic function is normal. The right ventricular size is normal. 3. The mitral valve is normal in structure. Trivial mitral valve regurgitation. No evidence of mitral stenosis. 4. The aortic valve is tricuspid. There is mild calcification of the aortic valve. Aortic valve regurgitation is not visualized. Mild aortic valve sclerosis is present, with no evidence of aortic valve stenosis. 5. The inferior vena cava is normal in size with <50% respiratory variability, suggesting right atrial pressure of 8 mmHg.  GI PROCEDURES:  Colonoscopy 12/11/2014: Sessile polyp was found in the descending colon; polypectomy was performed with a cold snare Surgical [P], descending, polyp - SUBMUCOSAL LIPOMA. - NO  ADENOMATOUS EPITHELIUM OR MALIGNANCY IDENTIFIED  EGD for dysphagia 04/28/2007: Unremarkable examination with dilatation to 17 [address].  Colonoscopy 05/16/2004: Well examination to the cecum  Past Medical History:  Diagnosis Date   Agent orange exposure    Arthritis    Asthma    Asthma with acute exacerbation 06/17/2015   COPD (chronic obstructive pulmonary disease) (HCC)    Diabetes mellitus    Hyperlipemia     Hypertension    OSA on CPAP    Pneumonia    Pneumonia due to COVID-19 virus 11/22/2019   PTSD (post-traumatic stress disorder)     Past Surgical History:  Procedure Laterality Date   CARDIAC CATHETERIZATION     right thumb surgery     TONSILLECTOMY      Prior to Admission medications   Medication Sig Start Date End Date Taking? Authorizing Provider  albuterol  (PROVENTIL ) (2.5 MG/3ML) 0.083% nebulizer solution Take 3 mLs (2.5 mg total) by nebulization every 4 (four) hours as needed for wheezing or shortness of breath (coughing fits). 04/14/21   Luke Orlan HERO, DO  albuterol  (VENTOLIN  HFA) 108 (90 Base) MCG/ACT inhaler INHALE 2 PUFFS BY MOUTH EVERY 4 HOURS AS NEEDED FOR WHEEZING OR SHORTNESS OF BREATH Patient taking differently: Inhale 2 puffs into the lungs every 4 (four) hours as needed for wheezing or shortness of breath. 08/22/19   Luke Orlan HERO, DO  amLODipine  (NORVASC ) 10 MG tablet Take 10 mg by mouth daily. 04/06/19   [provider]  ascorbic acid  (VITAMIN C ) 500 MG tablet Take 1,000 mg by mouth daily.  04/06/19   [provider]  aspirin  EC 81 MG EC tablet Take 1 tablet (81 mg total) by mouth daily. 07/17/21   Duke, Jon Garre, PA  azelastine  (ASTELIN ) 0.1 % nasal spray Place 1-2 sprays into both nostrils 2 (two) times daily as needed (nasal drainage). Use in each nostril as directed Patient taking differently: Place 1 spray into both nostrils 2 (two) times daily as needed for allergies or rhinitis (nasal drainage). 04/14/21   Luke Orlan HERO, DO  benzonatate  (TESSALON ) 100 MG capsule Take 1 capsule (100 mg total) by mouth every 8 (eight) hours as needed for cough. 03/08/22   Hazen Darryle BRAVO, FNP  Budeson-Glycopyrrol-Formoterol  (BREZTRI  AEROSPHERE) 160-9-4.8 MCG/ACT AERO Inhale 2 puffs into the lungs in the morning and at bedtime. with spacer and rinse mouth afterwards. 04/14/21   Luke Orlan HERO, DO  budesonide -formoterol  (SYMBICORT ) 80-4.5 MCG/ACT inhaler Inhale 2 puffs into the lungs  2 (two) times daily.    [provider]  calcipotriene (DOVONOX) 0.005 % cream Apply 1 Application topically daily as needed (feet). 04/08/20   [provider]  carvedilol  (COREG ) 12.5 MG tablet Take 6.25 mg by mouth 2 (two) times daily with a meal.  11/09/19   [provider]  Cellulose Carmellose Sodium POWD Apply 1 application topically daily as needed (irritation). Apply to feet    [provider]  Cholecalciferol  (VITAMIN D -3 PO) Take 1 capsule by mouth daily.    [provider]  clobetasol  ointment (TEMOVATE ) 0.05 % Apply 1 application  topically 2 (two) times daily as needed (rash). 11/13/19   [provider]  cyclobenzaprine  (FLEXERIL ) 5 MG tablet Take 1 tablet (5 mg total) by mouth at bedtime as needed for muscle spasms. 08/17/22   Arloa Suzen RAMAN, NP  doxazosin (CARDURA) 1 MG tablet Take 1 mg by mouth daily.    [provider]  EPINEPHrine  0.3 mg/0.3 mL IJ SOAJ injection  Inject 0.3 mLs (0.3 mg total) into the muscle as needed for anaphylaxis. 11/16/19   Jeneal Danita Macintosh, MD  EYLEA 2 MG/0.05ML SOLN Place into the left eye every 30 (thirty) days. INJECTION 04/17/19   [provider]  Fluocinolone  Acetonide Body 0.01 % OIL Apply 1 application  topically daily as needed (dry skin). 11/09/19   [provider]  fluticasone  (FLONASE ) 50 MCG/ACT nasal spray Place 1 spray into both nostrils 2 (two) times daily as needed (nasal congestion). 04/14/21   Luke Orlan HERO, DO  furosemide  (LASIX ) 20 MG tablet Take 20 mg by mouth daily. 02/02/20   [provider]  ketoconazole (NIZORAL) 2 % shampoo Apply 1 application  topically every Monday, Wednesday, and Friday.    [provider]  metFORMIN  (GLUCOPHAGE ) 500 MG tablet Take 250 mg by mouth 2 (two) times daily with a meal.    [provider]  methocarbamol  (ROBAXIN ) 500 MG tablet Take 1.5 tablets (750 mg total) by mouth every 6 (six) hours as needed for up  to 30 doses for muscle spasms (Neck Stiffness). 07/14/21   Christobal Guadalajara, MD  montelukast  (SINGULAIR ) 10 MG tablet Take 1 tablet (10 mg total) by mouth at bedtime. 08/28/20   Luke Orlan HERO, DO  Multiple Vitamins-Minerals (MULTIVITAMIN & MINERAL PO) Take 1 tablet by mouth daily. Reported on 06/17/2015    [provider]  omeprazole (PRILOSEC) 20 MG capsule Take 20 mg by mouth daily.    [provider]  OneTouch Delica Lancets 33G MISC daily. 09/01/19   [provider]  [facility] ULTRA test strip as needed.  09/01/19   [provider]  OXYGEN  Inhale 2 L into the lungs continuous.    [provider]  potassium chloride  (KLOR-CON ) 10 MEQ tablet Take 2 tablets (20 mEq total) by mouth daily for 7 days. 08/29/21 09/05/21  Trine Raynell Moder, MD  Respiratory Therapy Supplies (FLUTTER) DEVI Use as directed 04/20/16   Bobbitt, Elgin Pepper, MD  rosuvastatin  (CRESTOR ) 20 MG tablet Take 10 mg by mouth at bedtime. 08/11/21   [provider]  sertraline  (ZOLOFT ) 100 MG tablet Take 100 mg by mouth daily. 04/05/19   [provider]  simethicone  (MYLICON) 80 MG chewable tablet Chew 80 mg by mouth daily.    [provider]    Current Facility-Administered Medications  Medication Dose Route Frequency Provider Last Rate Last Admin   acetaminophen  (TYLENOL ) tablet 650 mg  650 mg Oral Q6H PRN Patel, Ekta V, MD       Or   acetaminophen  (TYLENOL ) suppository 650 mg  650 mg Rectal Q6H PRN Patel, Ekta V, MD       albuterol  (PROVENTIL ) (2.5 MG/3ML) 0.083% nebulizer solution 2.5 mg  2.5 mg Nebulization Q4H PRN Patel, Ekta V, MD       amLODipine  (NORVASC ) tablet 10 mg  10 mg Oral Daily Tobie Mario GAILS, MD       Benralizumab  SOSY 30 mg  30 mg Subcutaneous Q8 Weeks Kim, Yoon M, DO   30 mg at 12/31/20 1024   benzonatate  (TESSALON ) capsule 100 mg  100 mg Oral Q8H PRN Patel, Ekta V, MD       budesonide -glycopyrrolate-formoterol  (BREZTRI ) 160-9-4.8 MCG/ACT inhaler 2 puff   2 puff Inhalation Q12H Tobie Mario GAILS, MD       carvedilol  (COREG ) tablet 6.25 mg  6.25 mg Oral BID WC Patel, Ekta V, MD       hydrALAZINE  (APRESOLINE ) injection 5 mg  5 mg  Intravenous Q4H PRN Tobie Mario GAILS, MD       lactated ringers  infusion   Intravenous Continuous Tobie Mario GAILS, MD       montelukast  (SINGULAIR ) tablet 10 mg  10 mg Oral QHS Tobie Mario GAILS, MD       ondansetron  (ZOFRAN ) tablet 4 mg  4 mg Oral Q6H PRN Patel, Ekta V, MD       Or   ondansetron  (ZOFRAN ) injection 4 mg  4 mg Intravenous Q6H PRN Patel, Ekta V, MD       pantoprazole  (PROTONIX ) injection 40 mg  40 mg Intravenous Q12H Tobie Mario GAILS, MD       rosuvastatin  (CRESTOR ) tablet 10 mg  10 mg Oral QHS Patel, Ekta V, MD       [START ON 10/15/2023] sertraline  (ZOLOFT ) tablet 100 mg  100 mg Oral Daily Tobie Mario GAILS, MD       tezepelumab -ekko (TEZSPIRE ) 210 MG/1. syringe 210 mg  210 mg Subcutaneous Q28 days Luke Orlan HERO, DO   210 mg at 08/05/21 1345   Current Outpatient Medications  Medication Sig Dispense Refill   albuterol  (PROVENTIL ) (2.5 MG/3ML) 0.083% nebulizer solution Take 3 mLs (2.5 mg total) by nebulization every 4 (four) hours as needed for wheezing or shortness of breath (coughing fits). 75 mL 1   albuterol  (VENTOLIN  HFA) 108 (90 Base) MCG/ACT inhaler INHALE 2 PUFFS BY MOUTH EVERY 4 HOURS AS NEEDED FOR WHEEZING OR SHORTNESS OF BREATH (Patient taking differently: Inhale 2 puffs into the lungs every 4 (four) hours as needed for wheezing or shortness of breath.) 42.5 g 0   amLODipine  (NORVASC ) 10 MG tablet Take 10 mg by mouth daily.     ascorbic acid  (VITAMIN C ) 500 MG tablet Take 1,000 mg by mouth daily.      aspirin  EC 81 MG EC tablet Take 1 tablet (81 mg total) by mouth daily. 90 tablet 3   azelastine  (ASTELIN ) 0.1 % nasal spray Place 1-2 sprays into both nostrils 2 (two) times daily as needed (nasal drainage). Use in each nostril as directed (Patient taking differently: Place 1 spray into both nostrils 2 (two) times daily  as needed for allergies or rhinitis (nasal drainage).) 30 mL 5   benzonatate  (TESSALON ) 100 MG capsule Take 1 capsule (100 mg total) by mouth every 8 (eight) hours as needed for cough. 21 capsule 0   Budeson-Glycopyrrol-Formoterol  (BREZTRI  AEROSPHERE) 160-9-4.8 MCG/ACT AERO Inhale 2 puffs into the lungs in the morning and at bedtime. with spacer and rinse mouth afterwards. 10.7 g 5   budesonide -formoterol  (SYMBICORT ) 80-4.5 MCG/ACT inhaler Inhale 2 puffs into the lungs 2 (two) times daily.     calcipotriene (DOVONOX) 0.005 % cream Apply 1 Application topically daily as needed (feet).     carvedilol  (COREG ) 12.5 MG tablet Take 6.25 mg by mouth 2 (two) times daily with a meal.      Cellulose Carmellose Sodium POWD Apply 1 application topically daily as needed (irritation). Apply to feet     Cholecalciferol  (VITAMIN D -3 PO) Take 1 capsule by mouth daily.     clobetasol  ointment (TEMOVATE ) 0.05 % Apply 1 application  topically 2 (two) times daily as needed (rash).     cyclobenzaprine  (FLEXERIL ) 5 MG tablet Take 1 tablet (5 mg total) by mouth at bedtime as needed for muscle spasms. 14 tablet 0   doxazosin (CARDURA) 1 MG tablet Take 1 mg by mouth daily.     EPINEPHrine  0.3 mg/0.3 mL IJ SOAJ injection Inject 0.3  mLs (0.3 mg total) into the muscle as needed for anaphylaxis. 2 each 1   EYLEA 2 MG/0.05ML SOLN Place into the left eye every 30 (thirty) days. INJECTION     Fluocinolone  Acetonide Body 0.01 % OIL Apply 1 application  topically daily as needed (dry skin).     fluticasone  (FLONASE ) 50 MCG/ACT nasal spray Place 1 spray into both nostrils 2 (two) times daily as needed (nasal congestion). 16 g 5   furosemide  (LASIX ) 20 MG tablet Take 20 mg by mouth daily.     ketoconazole (NIZORAL) 2 % shampoo Apply 1 application  topically every Monday, Wednesday, and Friday.     metFORMIN  (GLUCOPHAGE ) 500 MG tablet Take 250 mg by mouth 2 (two) times daily with a meal.     methocarbamol  (ROBAXIN ) 500 MG tablet Take  1.5 tablets (750 mg total) by mouth every 6 (six) hours as needed for up to 30 doses for muscle spasms (Neck Stiffness). 30 tablet 0   montelukast  (SINGULAIR ) 10 MG tablet Take 1 tablet (10 mg total) by mouth at bedtime. 30 tablet 5   Multiple Vitamins-Minerals (MULTIVITAMIN & MINERAL PO) Take 1 tablet by mouth daily. Reported on 06/17/2015     omeprazole (PRILOSEC) 20 MG capsule Take 20 mg by mouth daily.     OneTouch Delica Lancets 33G MISC daily.     ONETOUCH ULTRA test strip as needed.      OXYGEN  Inhale 2 L into the lungs continuous.     potassium chloride  (KLOR-CON ) 10 MEQ tablet Take 2 tablets (20 mEq total) by mouth daily for 7 days. 14 tablet 0   Respiratory Therapy Supplies (FLUTTER) DEVI Use as directed 1 each 0   rosuvastatin  (CRESTOR ) 20 MG tablet Take 10 mg by mouth at bedtime.     sertraline  (ZOLOFT ) 100 MG tablet Take 100 mg by mouth daily.     simethicone  (MYLICON) 80 MG chewable tablet Chew 80 mg by mouth daily.      Allergies as of 10/14/2023 - Review Complete 10/14/2023  Allergen Reaction Noted   Codeine Nausea Only and Other (See Comments) 09/13/2009   Lipitor [atorvastatin] Other (See Comments) 10/27/2014   Pravachol [pravastatin sodium] Other (See Comments) 10/27/2014    Family History  Problem Relation Age of Onset   Emphysema Father    Asthma Father    Heart disease Father    Heart attack Father    Stroke Mother    Heart attack Brother    Diabetes Brother    Heart attack Brother    Diabetes Brother    Colon cancer Neg Hx     Social History   Socioeconomic History   Marital status: Widowed    Spouse name: Not on file   Number of children: 5   Years of education: Not on file   Highest education level: Not on file  Occupational History   Occupation: Retired  Tobacco Use   Smoking status: Former    Current packs/day: 0.00    Average packs/day: 1 pack/day for 20.0 years (20.0 ttl pk-yrs)    Types: Cigarettes    Start date: 03/09/1965    Quit date:  03/09/1985    Years since quitting: 38.6   Smokeless tobacco: Never  Vaping Use   Vaping status: Never Used  Substance and Sexual Activity   Alcohol use: No   Drug use: No   Sexual activity: Not on file  Other Topics Concern   Not on file  Social History Narrative   **  Merged History Encounter **       Epworth Sleepiness Scale = 6 (as of 04/17/2015)   Social Drivers of Corporate investment banker Strain: Not on file  Food Insecurity: Not on file  Transportation Needs: Not on file  Physical Activity: Not on file  Stress: Not on file  Social Connections: Not on file  Intimate Partner Violence: Not on file   Review of Systems: Gen: Denies fever, sweats or chills. No weight loss.  CV: Denies chest pain, palpitations or edema. Resp:See HPI. GI: See HPI. GU : Denies urinary burning, blood in urine, increased urinary frequency or incontinence. MS: Denies joint pain, muscles aches or weakness. Derm: Denies rash, itchiness, skin lesions or unhealing ulcers. Psych: Denies depression, anxiety, memory loss or confusion. Heme: Denies easy bruising, bleeding. Neuro:  Denies headaches, dizziness or paresthesias. Endo: + DM type II.  Physical Exam: Vital signs in last 24 hours: Temp:  [97.8 F (36.6 C)-98.8 F (37.1 C)] 97.8 F (36.6 C) (08/07 1221) Pulse Rate:  [65-71] 65 (08/07 1221) Resp:  [14-18] 18 (08/07 1221) BP: (161-176)/(68-70) 161/68 (08/07 1221) SpO2:  [100 %] 100 % (08/07 1221) Weight:  [80.7 kg] 80.7 kg (08/07 0912)   General: Alert 78 year old male in no acute distress. Head: Normocephalic and atraumatic. Eyes: No scleral icterus. Conjunctiva pink. Ears: Normal auditory acuity. Nose: No deformity, discharge or lesions. Mouth: Dentition intact. No ulcers or lesions.  Neck:  Supple. No lymphadenopathy or thyromegaly.  Lungs: Breath sounds clear throughout. No wheezes, rhonchi or crackles.  On oxygen  2 L nasal cannula. Heart: Rate and rhythm, no murmurs. Abdomen:  Protuberant, soft.  Nontender.  Positive bowel sounds all 4 quadrants.  No palpable mass.  No hepatosplenomegaly. Rectal: Patient declined rectal exam,  ED physician performed rectal exam which showed external hemorrhoids, no overt blood in the rectum, FOBT negative. Musculoskeletal: Symmetrical without gross deformities.  Pulses:  Normal pulses noted. Extremities:  Without clubbing or edema. Neurologic:  Alert and oriented x 4. No focal deficits.  Skin:  Intact without significant lesions or rashes. Psych:  Alert and cooperative. Normal mood and affect.  Intake/Output from previous day: No intake/output data recorded. Intake/Output this shift: No intake/output data recorded.  Lab Results: Recent Labs    10/14/23 0944  WBC 2.1*  HGB 8.5*  HCT 26.9*  PLT 151   BMET Recent Labs    10/14/23 0944  NA 140  K 4.3  CL 105  CO2 32  GLUCOSE 96  BUN 22  CREATININE 1.24  CALCIUM  9.6   LFT Recent Labs    10/14/23 0944  PROT 6.1*  ALBUMIN  3.5  AST 19  ALT 19  ALKPHOS 39  BILITOT 0.4   PT/INR Recent Labs    10/14/23 0944  LABPROT 13.8  INR 1.0   Hepatitis Panel No results for input(s): HEPBSAG, HCVAB, HEPAIGM, HEPBIGM in the last 72 hours.    Studies/Results: No results found.  IMPRESSION/PLAN:  78 year-old male admitted with acute macrocytic anemia. Iron and B12 levels pending. Admission Hg 8.5 ( Hg 13.1 two years ago).  Within the past week, patient passed 1 loose almost black stool x 1 and had bright red blood per the rectum x 2 to 3 days.  No significant abdominal pain, noted having a recent  knot to the LUQ area which lasted for a few days then abated.  Hemodynamically stable. - Await iron, ferritin, B12 and folate levels - Clear liquid diet  - EGD and colonoscopy  benefits and risks discussed including risk with sedation, risk of bleeding, perforation and infection. Timing to be determined. Await further recommendations per Dr. Albertus.  -  Pantoprazole  40 mg p.o. twice daily - CBC in a.m. - Transfuse for hemoglobin level < 8 or as needed if symptomatic  - Consider CTAP with oral and IV contrast to further evaluate recent not sensation to the LUQ and to rule out any intra-abdominal/pelvic pathology to explain his anemia and leukopenia  Leukopenia - CBC in am  Remote History of GERD, dysphagia - See plan above   Nonobstructive CAD per coronary CT 12/2019. No angina.   COPD on home oxygen  2L [place]  DM type II   Elida CHRISTELLA Shawl  10/14/2023, 2:24 PM

## 2023-10-14 NOTE — ED Notes (Signed)
 Pt walked to bathroom on 2L w no complaints of dizziness or needing to take a break

## 2023-10-14 NOTE — Progress Notes (Signed)
 Pt arrived to 6 north room 24 alert and oriented x4. Pain level 0/10. Pt ambulated from stretcher to bathroom with stand by assist using cane. Bed in lowest position. Call light in reach. Wife at bedside. All needs met at this time. Alain SANTA nurse assisted with skin assessment. Skin intact, just dry skin.

## 2023-10-15 ENCOUNTER — Encounter (HOSPITAL_COMMUNITY)
Admission: EM | Disposition: A | Discharge status: Home / Self Care | Payer: Self-pay | Source: Home / Self Care | Attending: Emergency Medicine

## 2023-10-15 ENCOUNTER — Inpatient Hospital Stay (HOSPITAL_COMMUNITY): Admitting: Registered Nurse

## 2023-10-15 ENCOUNTER — Inpatient Hospital Stay (HOSPITAL_BASED_OUTPATIENT_CLINIC_OR_DEPARTMENT_OTHER): Admitting: Registered Nurse

## 2023-10-15 ENCOUNTER — Inpatient Hospital Stay (HOSPITAL_COMMUNITY)

## 2023-10-15 ENCOUNTER — Encounter (HOSPITAL_COMMUNITY): Payer: Self-pay | Admitting: Internal Medicine

## 2023-10-15 DIAGNOSIS — K648 Other hemorrhoids: Secondary | ICD-10-CM

## 2023-10-15 DIAGNOSIS — J449 Chronic obstructive pulmonary disease, unspecified: Secondary | ICD-10-CM | POA: Diagnosis not present

## 2023-10-15 DIAGNOSIS — K644 Residual hemorrhoidal skin tags: Secondary | ICD-10-CM | POA: Diagnosis not present

## 2023-10-15 DIAGNOSIS — K3189 Other diseases of stomach and duodenum: Secondary | ICD-10-CM | POA: Diagnosis not present

## 2023-10-15 DIAGNOSIS — K5730 Diverticulosis of large intestine without perforation or abscess without bleeding: Secondary | ICD-10-CM | POA: Diagnosis not present

## 2023-10-15 DIAGNOSIS — K2970 Gastritis, unspecified, without bleeding: Secondary | ICD-10-CM

## 2023-10-15 DIAGNOSIS — I10 Essential (primary) hypertension: Secondary | ICD-10-CM | POA: Diagnosis not present

## 2023-10-15 DIAGNOSIS — K2971 Gastritis, unspecified, with bleeding: Secondary | ICD-10-CM | POA: Diagnosis not present

## 2023-10-15 DIAGNOSIS — N281 Cyst of kidney, acquired: Secondary | ICD-10-CM | POA: Diagnosis not present

## 2023-10-15 DIAGNOSIS — K625 Hemorrhage of anus and rectum: Secondary | ICD-10-CM | POA: Diagnosis not present

## 2023-10-15 DIAGNOSIS — D124 Benign neoplasm of descending colon: Secondary | ICD-10-CM

## 2023-10-15 DIAGNOSIS — D539 Nutritional anemia, unspecified: Secondary | ICD-10-CM

## 2023-10-15 DIAGNOSIS — R109 Unspecified abdominal pain: Secondary | ICD-10-CM | POA: Diagnosis not present

## 2023-10-15 DIAGNOSIS — K5790 Diverticulosis of intestine, part unspecified, without perforation or abscess without bleeding: Secondary | ICD-10-CM | POA: Diagnosis not present

## 2023-10-15 HISTORY — PX: ESOPHAGOGASTRODUODENOSCOPY: SHX5428

## 2023-10-15 HISTORY — PX: COLONOSCOPY: SHX5424

## 2023-10-15 LAB — BASIC METABOLIC PANEL WITH GFR
Anion gap: 9 (ref 5–15)
BUN: 18 mg/dL (ref 8–23)
CO2: 31 mmol/L (ref 22–32)
Calcium: 10.2 mg/dL (ref 8.9–10.3)
Chloride: 103 mmol/L (ref 98–111)
Creatinine, Ser: 0.95 mg/dL (ref 0.61–1.24)
GFR, Estimated: >60 mL/min (ref >60)
Glucose, Bld: 103 mg/dL — ABNORMAL HIGH (ref 70–99)
Potassium: 4.2 mmol/L (ref 3.5–5.1)
Sodium: 143 mmol/L (ref 135–145)

## 2023-10-15 LAB — HEMOGLOBIN AND HEMATOCRIT, BLOOD
HCT: 27.3 % — ABNORMAL LOW (ref 39.0–52.0)
Hemoglobin: 8.8 g/dL — ABNORMAL LOW (ref 13.0–17.0)

## 2023-10-15 LAB — GLUCOSE, CAPILLARY
Glucose-Capillary: 99 mg/dL (ref 70–99)
Glucose-Capillary: 98 mg/dL (ref 70–99)

## 2023-10-15 SURGERY — EGD (ESOPHAGOGASTRODUODENOSCOPY)
Anesthesia: Monitor Anesthesia Care

## 2023-10-15 MED ORDER — PROPOFOL 10 MG/ML IV BOLUS
INTRAVENOUS | Status: DC | PRN
  Administered 2023-10-15: 30 mg via INTRAVENOUS
  Administered 2023-10-15: 100 ug/kg/min via INTRAVENOUS

## 2023-10-15 MED ORDER — IOHEXOL 350 MG/ML SOLN
75.0000 mL | Freq: Once | INTRAVENOUS | Status: AC | PRN
  Administered 2023-10-15: 75 mL via INTRAVENOUS

## 2023-10-15 MED ORDER — LIDOCAINE 2% (20 MG/ML) 5 ML SYRINGE
INTRAMUSCULAR | Status: DC | PRN
  Administered 2023-10-15: 100 mg via INTRAVENOUS

## 2023-10-15 NOTE — Op Note (Signed)
 [facility] Patient Name: Douglas Hansen Procedure Date : 10/15/2023 MRN: [id number] Attending MD: Gordy CHRISTELLA Starch , MD, [id number] Date of Birth: Jul 28, 1945 CSN: [id number] Age: 78 Admit Type: Inpatient Procedure:                Upper GI endoscopy Indications:              Unexplained macrocytic anemia, rectal bleeding Providers:                Gordy CHRISTELLA. Starch, MD, Darleene Bare, RN, Felice Sar,                            Technician Referring MD:             [facility] Medicines:                Monitored Anesthesia Care Complications:            No immediate complications. Estimated Blood Loss:     Estimated blood loss was minimal. Procedure:                Pre-Anesthesia Assessment:                           - Prior to the procedure, a History and Physical                            was performed, and patient medications and                            allergies were reviewed. The patient's tolerance of                            previous anesthesia was also reviewed. The risks                            and benefits of the procedure and the sedation                            options and risks were discussed with the patient.                            All questions were answered, and informed consent                            was obtained. Prior Anticoagulants: The patient has                            taken no anticoagulant or antiplatelet agents. ASA                            Grade Assessment: III - A patient with severe                            systemic disease. After reviewing the risks and  benefits, the patient was deemed in satisfactory                            condition to undergo the procedure.                           After obtaining informed consent, the endoscope was                            passed under direct vision. Throughout the                            procedure, the patient's blood pressure, pulse, and                             oxygen  saturations were monitored continuously. The                            [id number]-[id number] ([id number]) Olympus endoscope was introduced                            through the mouth, and advanced to the second part                            of duodenum. The upper GI endoscopy was                            accomplished without difficulty. The patient                            tolerated the procedure well. Scope In: Scope Out: Findings:      The examined esophagus was normal.      Localized mildly erythematous mucosa without bleeding was found in the       prepyloric region of the stomach. Biopsies were taken with a cold       forceps for Helicobacter pylori testing.      The exam of the stomach was otherwise normal.      The examined duodenum was normal. Impression:               - Normal esophagus.                           - Erythematous mucosa in the prepyloric region of                            the stomach. Biopsied to exclude H. Pylori                            infection.                           - Normal examined duodenum. Moderate Sedation:      N/A Recommendation:           - Patient has a contact number available for  emergencies. The signs and symptoms of potential                            delayed complications were discussed with the                            patient. Return to normal activities tomorrow.                            Written discharge instructions were provided to the                            patient.                           - Resume previous diet.                           - Continue present medications.                           - Await pathology results.                           - See the other procedure note for documentation of                            additional recommendations. Procedure Code(s):        --- Professional ---                           [id number], Esophagogastroduodenoscopy, flexible,                             transoral; with biopsy, single or multiple Diagnosis Code(s):        --- Professional ---                           K31.89, Other diseases of stomach and duodenum CPT copyright 2022 American [employer] Association. All rights reserved. The codes documented in this report are preliminary and upon coder review may  be revised to meet current compliance requirements. Gordy CHRISTELLA Starch, MD 10/15/2023 10:44:37 AM This report has been signed electronically. Number of Addenda: 0

## 2023-10-15 NOTE — Discharge Instructions (Signed)
 Your CT scan did show also a 12 x 13 mm cyst on your pancreas.  It is recommended to have a MRI of your abdomen sometime in the next 2 to 4 weeks to further clarify this cyst.  The radiologist recommended having this done outpatient basis.  Your PCP can order this or the hematologist that you are seeing for your anemia can also order this but just make sure you follow-up on it.

## 2023-10-15 NOTE — Op Note (Signed)
 [facility] Patient Name: Douglas Hansen Procedure Date : 10/15/2023 MRN: [id number] Attending MD: Gordy CHRISTELLA Starch , MD, [id number] Date of Birth: 07/28/1945 CSN: [id number] Age: 78 Admit Type: Inpatient Procedure:                Colonoscopy Indications:              Rectal bleeding, newly discovered macrocytic anemia                            with normal B12/folate, iron also normal; last                            colonoscopy 2016 Providers:                Gordy CHRISTELLA. Starch, MD, Darleene Bare, RN, Felice Sar,                            Technician Referring MD:             [facility] Medicines:                Monitored Anesthesia Care Complications:            No immediate complications. Estimated Blood Loss:     Estimated blood loss: none. Procedure:                Pre-Anesthesia Assessment:                           - Prior to the procedure, a History and Physical                            was performed, and patient medications and                            allergies were reviewed. The patient's tolerance of                            previous anesthesia was also reviewed. The risks                            and benefits of the procedure and the sedation                            options and risks were discussed with the patient.                            All questions were answered, and informed consent                            was obtained. Prior Anticoagulants: The patient has                            taken no anticoagulant or antiplatelet agents. ASA  Grade Assessment: III - A patient with severe                            systemic disease. After reviewing the risks and                            benefits, the patient was deemed in satisfactory                            condition to undergo the procedure.                           After obtaining informed consent, the colonoscope                            was passed  under direct vision. Throughout the                            procedure, the patient's blood pressure, pulse, and                            oxygen  saturations were monitored continuously. The                            [id number]-HQ190L ([id number]) Olympus colonoscope was                            introduced through the anus and advanced to the                            cecum, identified by appendiceal orifice and                            ileocecal valve. The colonoscopy was performed                            without difficulty. The patient tolerated the                            procedure well. The quality of the bowel                            preparation was good. The ileocecal valve,                            appendiceal orifice, and rectum were photographed. Scope In: 10:49:56 AM Scope Out: 11:03:00 AM Scope Withdrawal Time: 0 hours 9 minutes 48 seconds  Total Procedure Duration: 0 hours 13 minutes 4 seconds  Findings:      Skin tags were found on perianal exam.      A 4 mm polyp was found in the descending colon. The polyp was sessile.       The polyp was removed with a cold snare. Resection and retrieval were       complete.      Multiple medium-mouthed  and small-mouthed diverticula were found in the       sigmoid colon.      Internal hemorrhoids were found during retroflexion. The hemorrhoids       were medium-sized. Impression:               - Perianal skin tags found on perianal exam.                           - One 4 mm polyp in the descending colon, removed                            with a cold snare. Resected and retrieved.                           - Moderate diverticulosis in the sigmoid colon.                           - Internal hemorrhoids. This is the most likely                            source of rectal bleeding (but not anemia). Moderate Sedation:      N/A Recommendation:           - Return patient to hospital ward for possible                             discharge same day.                           - Resume regular diet.                           - Continue present medications.                           - Await pathology results.                           - No repeat colonoscopy due to age.                           - Outpatient referral to hematology for macrocytic                            anemia with leukopenia and normal B12/folate.                           - GI will sign off, call if questions. Procedure Code(s):        --- Professional ---                           [phone], Colonoscopy, flexible; with removal of                            tumor(s), polyp(s), or other lesion(s) by snare  technique Diagnosis Code(s):        --- Professional ---                           D12.4, Benign neoplasm of descending colon                           K64.8, Other hemorrhoids                           K64.4, Residual hemorrhoidal skin tags                           K62.5, Hemorrhage of anus and rectum                           K57.30, Diverticulosis of large intestine without                            perforation or abscess without bleeding CPT copyright 2022 American Medical Association. All rights reserved. The codes documented in this report are preliminary and upon coder review may  be revised to meet current compliance requirements. Gordy CHRISTELLA Starch, MD 10/15/2023 11:09:49 AM This report has been signed electronically. Number of Addenda: 0

## 2023-10-15 NOTE — Care Management CC44 (Signed)
 Condition Code 44 Documentation Completed  Patient Details  Name: Kristan Votta MRN: [id number] Date of Birth: 12-05-1945   Condition Code 44 given:  Yes Patient signature on Condition Code 44 notice:  Yes Documentation of 2 MD's agreement:  Yes Code 44 added to claim:  Yes    Debarah Saunas, RN 10/15/2023, 5:47 PM

## 2023-10-15 NOTE — Progress Notes (Signed)
 AVS completed for discharge packet; unable to print due to Code 44 flagged on patients chart.

## 2023-10-15 NOTE — Anesthesia Preprocedure Evaluation (Signed)
 Anesthesia Evaluation  Patient identified by MRN, date of birth, ID band Patient awake    Reviewed: Allergy & Precautions, NPO status , Patient's Chart, lab work & pertinent test results, reviewed documented beta blocker date and time   History of Anesthesia Complications Negative for: history of anesthetic complications  Airway Mallampati: II  TM Distance: >3 FB Neck ROM: Full    Dental  (+) Missing,    Pulmonary asthma , sleep apnea , COPD (2L [place]),  oxygen  dependent, former smoker   Pulmonary exam normal        Cardiovascular hypertension, Pt. on home beta blockers and Pt. on medications Normal cardiovascular exam     Neuro/Psych   Anxiety        GI/Hepatic ,GERD  Medicated,,  Endo/Other  diabetes, Type 2, Oral Hypoglycemic Agents    Renal/GU      Musculoskeletal  (+) Arthritis ,    Abdominal   Peds  Hematology  (+) Blood dyscrasia (Hgb 8.8), anemia   Anesthesia Other Findings   Reproductive/Obstetrics                              Anesthesia Physical Anesthesia Plan  ASA: 3  Anesthesia Plan: MAC   Post-op Pain Management: Minimal or no pain anticipated   Induction:   PONV Risk Score and Plan: Treatment may vary due to age or medical condition and Propofol  infusion  Airway Management Planned: Natural Airway and Nasal Cannula  Additional Equipment: None  Intra-op Plan:   Post-operative Plan:   Informed Consent: I have reviewed the patients History and Physical, chart, labs and discussed the procedure including the risks, benefits and alternatives for the proposed anesthesia with the patient or authorized representative who has indicated his/her understanding and acceptance.       Plan Discussed with: CRNA  Anesthesia Plan Comments:          Anesthesia Quick Evaluation

## 2023-10-15 NOTE — Progress Notes (Signed)
 Discharge instructions given to pt. Pt verbalized understanding of all teaching and had no further questions. Pt discharged to home with son with all belongings and paperwork

## 2023-10-15 NOTE — Progress Notes (Signed)
Pt transported to endo for procedure.

## 2023-10-15 NOTE — Interval H&P Note (Signed)
 History and Physical Interval Note: For EGD and colonoscopy today to evaluate macrocytic anemia, recent rectal bleeding The nature of the procedure, as well as the risks, benefits, and alternatives were carefully and thoroughly reviewed with the patient. Ample time for discussion and questions allowed. The patient understood, was satisfied, and agreed to proceed.   10/15/2023 9:29 AM  Douglas Hansen  has presented today for surgery, with the diagnosis of Rectal bleeding, macrocytic anemia.  The various methods of treatment have been discussed with the patient and family. After consideration of risks, benefits and other options for treatment, the patient has consented to  Procedure(s): EGD (ESOPHAGOGASTRODUODENOSCOPY) (N/A) COLONOSCOPY (N/A) as a surgical intervention.  The patient's history has been reviewed, patient examined, no change in status, stable for surgery.  I have reviewed the patient's chart and labs.  Questions were answered to the patient's satisfaction.     Gordy HERO Christapher Gillian

## 2023-10-15 NOTE — Transfer of Care (Signed)
 Immediate Anesthesia Transfer of Care Note  Patient: Douglas Hansen  Procedure(s) Performed: EGD (ESOPHAGOGASTRODUODENOSCOPY) COLONOSCOPY  Patient Location: PACU  Anesthesia Type:MAC  Level of Consciousness: awake, drowsy, and patient cooperative  Airway & Oxygen  Therapy: Patient connected to nasal cannula oxygen   Post-op Assessment: Report given to RN and Post -op Vital signs reviewed and stable  Post vital signs: Reviewed and stable  Last Vitals:  Vitals Value Taken Time  BP 128/68   Temp 36.1 C 10/15/23 11:09  Pulse 73 10/15/23 11:11  Resp 18 10/15/23 11:11  SpO2 100 % 10/15/23 11:11  Vitals shown include unfiled device data.  Last Pain:  Vitals:   10/15/23 1109  TempSrc: Temporal  PainSc: 0-No pain         Complications: There were no known notable events for this encounter.

## 2023-10-15 NOTE — Care Management Obs Status (Signed)
 MEDICARE OBSERVATION STATUS NOTIFICATION   Patient Details  Name: Douglas Hansen MRN: [id number] Date of Birth: 07-Feb-1946   Medicare Observation Status Notification Given:  Yes    Debarah Saunas, RN 10/15/2023, 5:47 PM

## 2023-10-15 NOTE — Anesthesia Postprocedure Evaluation (Signed)
 Anesthesia Post Note  Patient: Douglas Hansen  Procedure(s) Performed: EGD (ESOPHAGOGASTRODUODENOSCOPY) COLONOSCOPY     Patient location during evaluation: PACU Anesthesia Type: MAC Level of consciousness: awake and alert Pain management: pain level controlled Vital Signs Assessment: post-procedure vital signs reviewed and stable Respiratory status: spontaneous breathing, nonlabored ventilation and respiratory function stable Cardiovascular status: blood pressure returned to baseline Postop Assessment: no apparent nausea or vomiting Anesthetic complications: no   There were no known notable events for this encounter.  Last Vitals:  Vitals:   10/15/23 1130 10/15/23 1140  BP: (!) 163/69 (!) 176/70  Pulse: 65 (!) 54  Resp: 16 18  Temp:    SpO2: 100% 100%    Last Pain:  Vitals:   10/15/23 1140  TempSrc:   PainSc: 0-No pain                 Vertell Row

## 2023-10-15 NOTE — Discharge Summary (Signed)
 Physician Discharge Summary   Patient: Douglas Hansen MRN: [id number] DOB: [birth year]  Admit date:     10/14/2023  Discharge date: 10/15/23  Discharge Physician: Reyes VEAR Gaw   PCP: Gerome Brunet, DO   Recommendations at discharge:   Patient's hemoglobin remained 8.8 while in house with no further bleeding. He was referred to hematology ambulatory referral placed prior to discharge to ascertain reason for macrocytic anemia. Patient had EGD biopsies pending at time of discharge, please follow-up on these results. Patient CT abdomen did show a 13 x 12 mm low-density lesion in the head the pancreas.  Recommended repeat MRI abdomen done outpatient --ensure that patient has this repeated outpatient.  Discharge Diagnoses: Principal Problem:   BRBPR (bright red blood per rectum) Active Problems:   Macrocytic anemia   Gastritis without bleeding   Benign neoplasm of descending colon  Resolved Problems:   * No resolved hospital problems. [facility] Course: Douglas Hansen is a 78 y.o. male with past medical history  of  asthma,COPDx 6-7 yrs,OSA on CPAP, diabetes, hyperlipidemia, hypertension, GERD, claudication, PTSD presenting with intermittent bright red blood per rectum and melena.  Patient is a limited historian does talk about his arthritis and the vertigo he gets but denies dizziness.  No other symptoms of chest pain palpitations.  Found to have anemia of 8.5 on admission.  Unclear what his baseline is, last hemoglobin was 13.1 in June 2023.  He remained stable with hemoglobin of 8.8 throughout admission including day of discharge.  Patient await EGD and colonoscopy to assess for any bleeding.  EGD essentially negative although there was some evidence of gastritis and therefore biopsies were taken.  These were pending at time of discharge.  Colonoscopy showed internal hemorrhoids and polyp which was removed.  GI deemed internal hemorrhoids most likely cause of his intermittent  bleeding.  Because of how well he looks clinically, the fact his hemoglobin remained stable, and no further evidence of bleeding he was able to be discharged after his endoscopies on 10/15/2023.  Ambulatory referral placed prior to discharge for hematology to assess for causes of macrocytic anemia in light of normal B12/folate.  Assessment and Plan: Abdominal pain/bright red blood per rectum: - CT of abdomen pelvis negative. - EGD negative. - Colonoscopy revealed internal hemorrhoids as likely cause of his bleed. - Hemoglobin remained stable while in house and therefore patient was discharged home with gastroenterology blessing.   >> Macrocytosis/ABLA: -Unclear cause of patient's macrocytic anemia in light of normal B12/folate.  Patient denies alcohol use. -Heme referral placed as above.   >> GERD/peptic ulcer disease: IV PPI and aspiration precaution.   >> Diabetes mellitus type 2: Glycemic protocol and a carb consistent diet postprocedure until then he may have clear liquid diet and then n.p.o. after midnight.  Accu-Cheks every 4 hourly. -CBGs good while in house.   >> Essential hypertension: - Blood pressure crept upwards while he was admitted. - He was discharged home on his home regimen.  Home medications include amlodipine  10 mg, Coreg  6.25 mg, Lasix  20 mg,   >> Asthma - COPD overlap syndrome: Continue patient's budesonide  and as needed albuterol .   >> OSA on CPAP: CPAP per home settings.  >> Pancreatic lesion: - Recommended to have MRI outpatient after he recovers from his acute symptoms - 13 x 12 mm low-density lesion in the head of the pancreas needing further imaging.  Instructions given to patient to follow-up on this as well as in his discharge instructions above.  Consultants: Gastroenterology Procedures performed: EGD/colonoscopy Disposition: Home Diet recommendation:  Discharge Diet Orders (From admission, onward)     Start     Ordered   10/15/23  0000  Diet - low sodium heart healthy        10/15/23 1602           Low-sodium heart healthy DISCHARGE MEDICATION: Allergies as of 10/15/2023       Reactions   Victoza [liraglutide] Nausea And Vomiting   Codeine Nausea Only, Other (See Comments)   GI upset   Lipitor [atorvastatin] Other (See Comments)   Made my stomach burn   Pravachol [pravastatin Sodium] Other (See Comments)   Caused joint pain        Medication List     TAKE these medications    albuterol  108 (90 Base) MCG/ACT inhaler Commonly known as: VENTOLIN  HFA INHALE 2 PUFFS BY MOUTH EVERY 4 HOURS AS NEEDED FOR WHEEZING OR SHORTNESS OF BREATH What changed: See the new instructions.   albuterol  (2.5 MG/3ML) 0.083% nebulizer solution Commonly known as: PROVENTIL  Take 3 mLs (2.5 mg total) by nebulization every 4 (four) hours as needed for wheezing or shortness of breath (coughing fits). What changed: Another medication with the same name was changed. Make sure you understand how and when to take each.   amLODipine  10 MG tablet Commonly known as: NORVASC  Take 10 mg by mouth daily.   ascorbic acid  500 MG tablet Commonly known as: VITAMIN C  Take 1,000 mg by mouth daily.   aspirin  EC 81 MG tablet Take 1 tablet (81 mg total) by mouth daily.   azelastine  0.1 % nasal spray Commonly known as: ASTELIN  Place 1-2 sprays into both nostrils 2 (two) times daily as needed (nasal drainage). Use in each nostril as directed What changed:  how much to take reasons to take this additional instructions   benzonatate  100 MG capsule Commonly known as: TESSALON  Take 1 capsule (100 mg total) by mouth every 8 (eight) hours as needed for cough.   Breztri  Aerosphere 160-9-4.8 MCG/ACT Aero inhaler Generic drug: budesonide -glycopyrrolate-formoterol  Inhale 2 puffs into the lungs in the morning and at bedtime. with spacer and rinse mouth afterwards.   budesonide -formoterol  80-4.5 MCG/ACT inhaler Commonly known as:  SYMBICORT  Inhale 2 puffs into the lungs 2 (two) times daily.   calcipotriene 0.005 % cream Commonly known as: DOVONOX Apply 1 Application topically daily as needed (feet).   carvedilol  12.5 MG tablet Commonly known as: COREG  Take 6.25 mg by mouth 2 (two) times daily with a meal.   Cellulose Carmellose Sodium Powd Apply 1 application topically daily as needed (irritation). Apply to feet   clobetasol  ointment 0.05 % Commonly known as: TEMOVATE  Apply 1 application  topically 2 (two) times daily as needed (rash).   cyclobenzaprine  5 MG tablet Commonly known as: FLEXERIL  Take 1 tablet (5 mg total) by mouth at bedtime as needed for muscle spasms.   doxazosin 1 MG tablet Commonly known as: CARDURA Take 1 mg by mouth daily.   EPINEPHrine  0.3 mg/0.3 mL Soaj injection Commonly known as: EPI-PEN Inject 0.3 mLs (0.3 mg total) into the muscle as needed for anaphylaxis.   Eylea 2 MG/0.05ML Soln Generic drug: Aflibercept Place into the left eye every 30 (thirty) days. INJECTION   Fluocinolone  Acetonide Body 0.01 % Oil Apply 1 application  topically daily as needed (dry skin).   fluticasone  50 MCG/ACT nasal spray Commonly known as: FLONASE  Place 1 spray into both nostrils 2 (two) times daily as needed (nasal  congestion).   Flutter Devi Use as directed   furosemide  20 MG tablet Commonly known as: LASIX  Take 20 mg by mouth daily.   ketoconazole 2 % shampoo Commonly known as: NIZORAL Apply 1 application  topically every Monday, Wednesday, and Friday.   metFORMIN  500 MG tablet Commonly known as: GLUCOPHAGE  Take 250 mg by mouth 2 (two) times daily with a meal.   methocarbamol  500 MG tablet Commonly known as: ROBAXIN  Take 1.5 tablets (750 mg total) by mouth every 6 (six) hours as needed for up to 30 doses for muscle spasms (Neck Stiffness).   montelukast  10 MG tablet Commonly known as: SINGULAIR  Take 1 tablet (10 mg total) by mouth at bedtime.   MULTIVITAMIN & MINERAL  PO Take 1 tablet by mouth daily. Reported on 06/17/2015   omeprazole 20 MG capsule Commonly known as: PRILOSEC Take 20 mg by mouth daily.   OneTouch Delica Lancets 33G Misc daily.   OneTouch Ultra test strip Generic drug: glucose blood as needed.   OXYGEN  Inhale 2 L into the lungs continuous.   potassium chloride  10 MEQ tablet Commonly known as: KLOR-CON  Take 2 tablets (20 mEq total) by mouth daily for 7 days.   rosuvastatin  40 MG tablet Commonly known as: CRESTOR  Take 20 mg by mouth daily.   sertraline  100 MG tablet Commonly known as: ZOLOFT  Take 100 mg by mouth daily.   simethicone  80 MG chewable tablet Commonly known as: MYLICON Chew 80 mg by mouth daily.        Discharge Exam: Filed Weights   10/14/23 0912  Weight: 80.7 kg   Gen:  Alert, cooperative patient who appears stated age in no acute distress.  Vital signs reviewed. Heart:  RRR Lungs: CTAB Abd:  S/ND/NT Ext:  No LE edema Neuro: ANO x 4 and moving all limbs symmetrically  Condition at discharge: Good  The results of significant diagnostics from this hospitalization (including imaging, microbiology, ancillary and laboratory) are listed below for reference.   Imaging Studies: CT ABDOMEN PELVIS W CONTRAST Result Date: 10/15/2023 CLINICAL DATA:  Hypotension. Abdominal pain. Bright red blood per rectum. EXAM: CT ABDOMEN AND PELVIS WITH CONTRAST TECHNIQUE: Multidetector CT imaging of the abdomen and pelvis was performed using the standard protocol following bolus administration of intravenous contrast. RADIATION DOSE REDUCTION: This exam was performed according to the departmental dose-optimization program which includes automated exposure control, adjustment of the mA and/or kV according to patient size and/or use of iterative reconstruction technique. CONTRAST:  75mL OMNIPAQUE  IOHEXOL  350 MG/ML SOLN COMPARISON:  None Available. FINDINGS: Lower chest: Dependent atelectasis bilaterally. Tiny pericardial  effusion although heart has been incompletely visualized. Hepatobiliary: No suspicious focal abnormality within the liver parenchyma. There is no evidence for gallstones, gallbladder wall thickening, or pericholecystic fluid. No intrahepatic or extrahepatic biliary dilation. Pancreas: 13 x 12 mm low-density lesion is identified in the head of the pancreas without main duct dilatation. Spleen: No splenomegaly. No suspicious focal mass lesion. Adrenals/Urinary Tract: No adrenal nodule or mass. 2.9 cm simple cyst noted interpolar right kidney. 2.2 cm simple cyst noted upper pole left kidney. Tiny well-defined homogeneous low-density lesions in both kidneys are too small to characterize but are statistically most likely benign and probably cysts. No followup imaging is recommended. No evidence for hydroureter. The urinary bladder appears normal for the degree of distention. Stomach/Bowel: Stomach is unremarkable. No gastric wall thickening. No evidence of outlet obstruction. Duodenum is normally positioned as is the ligament of Treitz. No small bowel wall thickening. No  small bowel dilatation. The terminal ileum is normal. The appendix is normal. No gross colonic mass. No colonic wall thickening. Mild left-sided diverticulosis noted without diverticulitis. There is fluid in the rectum, a CT finding that can be associated with clinical diarrhea. Vascular/Lymphatic: There is moderate atherosclerotic calcification of the abdominal aorta without aneurysm. There is no gastrohepatic or hepatoduodenal ligament lymphadenopathy. No retroperitoneal or mesenteric lymphadenopathy. No pelvic sidewall lymphadenopathy. Reproductive: The prostate gland and seminal vesicles are unremarkable. Other: No intraperitoneal free fluid. Musculoskeletal: No worrisome lytic or sclerotic osseous abnormality. IMPRESSION: 1. Fluid in the rectum, a CT finding that can be associated with clinical diarrhea. No colonic wall thickening or pericolonic  edema/inflammation to suggest colitis. 2. 13 x 12 mm low-density lesion in the head of the pancreas without main duct dilatation. This is probably a cystic lesion and may be a pseudocyst or side branch IPMN. MRI of the abdomen with and without contrast recommended to further evaluate. MRI should be deferred to an outpatient basis after the patient recovers from acute symptoms and can best participate with positioning and reproducible breath holding. 3. Mild left-sided diverticulosis without diverticulitis. 4.  Aortic Atherosclerosis (ICD10-I70.0). Electronically Signed   By: Camellia Candle M.D.   On: 10/15/2023 05:27    Microbiology: Results for orders placed or performed during the hospital encounter of 03/08/22  SARS CORONAVIRUS 2 (TAT 6-24 HRS) Anterior Nasal Swab     Status: None   Collection Time: 03/08/22 10:13 AM   Specimen: Anterior Nasal Swab  Result Value Ref Range Status   SARS Coronavirus 2 NEGATIVE NEGATIVE Final    Comment: (NOTE) SARS-CoV-2 target nucleic acids are NOT DETECTED.  The SARS-CoV-2 RNA is generally detectable in upper and lower respiratory specimens during the acute phase of infection. Negative results do not preclude SARS-CoV-2 infection, do not rule out co-infections with other pathogens, and should not be used as the sole basis for treatment or other patient management decisions. Negative results must be combined with clinical observations, patient history, and epidemiological information. The expected result is Negative.  Fact Sheet for Patients: HairSlick.no  Fact Sheet for Healthcare Providers: quierodirigir.com  This test is not yet approved or cleared by the [place]  FDA and  has been authorized for detection and/or diagnosis of SARS-CoV-2 by FDA under an Emergency Use Authorization (EUA). This EUA will remain  in effect (meaning this test can be used) for the duration of the COVID-19  declaration under Se ction 564(b)(1) of the Act, 21 U.S.C. section 360bbb-3(b)(1), unless the authorization is terminated or revoked sooner.  Performed at [facility], [address] [place], [place] [address]     Labs: CBC: Recent Labs  Lab 10/14/23 [phone] 10/14/23 1628 10/14/23 1946 10/15/23 0548  WBC 2.1*  --   --   --   NEUTROABS 1.0*  --   --   --   HGB 8.5* 9.0* 8.8* 8.8*  HCT 26.9* 28.1* 27.5* 27.3*  MCV 105.1*  --   --   --   PLT 151  --   --   --    Basic Metabolic Panel: Recent Labs  Lab 10/14/23 0944 10/15/23 0548  NA 140 143  K 4.3 4.2  CL 105 103  CO2 32 31  GLUCOSE 96 103*  BUN 22 18  CREATININE 1.24 0.95  CALCIUM  9.6 10.2   Liver Function Tests: Recent Labs  Lab 10/14/23 0944  AST 19  ALT 19  ALKPHOS 39  BILITOT 0.4  PROT 6.1*  ALBUMIN  3.5   CBG: Recent Labs  Lab 10/14/23 1441 10/14/23 2019 10/15/23 0731 10/15/23 1411  GLUCAP 87 91 99 98    Discharge time spent: Less than 30 minutes.  Signed: Reyes VEAR Gaw, MD [facility] 10/15/2023

## 2023-10-15 NOTE — Plan of Care (Signed)
  Problem: Education: Goal: Knowledge of General Education information will improve Description: Including pain rating scale, medication(s)/side effects and non-pharmacologic comfort measures Outcome: Progressing   Problem: Elimination: Goal: Will not experience complications related to bowel motility Outcome: Progressing   Problem: Pain Managment: Goal: General experience of comfort will improve and/or be controlled Outcome: Progressing   Problem: Safety: Goal: Ability to remain free from injury will improve Outcome: Progressing   Problem: Skin Integrity: Goal: Risk for impaired skin integrity will decrease Outcome: Progressing

## 2023-10-18 ENCOUNTER — Encounter (HOSPITAL_COMMUNITY): Payer: Self-pay | Admitting: Internal Medicine

## 2023-10-18 LAB — SURGICAL PATHOLOGY

## 2023-10-20 ENCOUNTER — Ambulatory Visit: Payer: Self-pay | Admitting: Internal Medicine

## 2023-11-01 ENCOUNTER — Telehealth: Payer: Self-pay

## 2023-11-01 DIAGNOSIS — D649 Anemia, unspecified: Secondary | ICD-10-CM | POA: Diagnosis not present

## 2023-11-01 DIAGNOSIS — E1151 Type 2 diabetes mellitus with diabetic peripheral angiopathy without gangrene: Secondary | ICD-10-CM | POA: Diagnosis not present

## 2023-11-01 DIAGNOSIS — K649 Unspecified hemorrhoids: Secondary | ICD-10-CM | POA: Diagnosis not present

## 2023-11-01 DIAGNOSIS — I739 Peripheral vascular disease, unspecified: Secondary | ICD-10-CM | POA: Diagnosis not present

## 2023-11-01 DIAGNOSIS — J449 Chronic obstructive pulmonary disease, unspecified: Secondary | ICD-10-CM | POA: Diagnosis not present

## 2023-11-01 DIAGNOSIS — Z9981 Dependence on supplemental oxygen: Secondary | ICD-10-CM | POA: Diagnosis not present

## 2023-11-01 DIAGNOSIS — Z79899 Other long term (current) drug therapy: Secondary | ICD-10-CM | POA: Diagnosis not present

## 2023-11-01 DIAGNOSIS — Z09 Encounter for follow-up examination after completed treatment for conditions other than malignant neoplasm: Secondary | ICD-10-CM | POA: Diagnosis not present

## 2023-11-01 DIAGNOSIS — K219 Gastro-esophageal reflux disease without esophagitis: Secondary | ICD-10-CM | POA: Diagnosis not present

## 2023-11-01 DIAGNOSIS — K869 Disease of pancreas, unspecified: Secondary | ICD-10-CM | POA: Diagnosis not present

## 2023-11-01 DIAGNOSIS — D72819 Decreased white blood cell count, unspecified: Secondary | ICD-10-CM | POA: Diagnosis not present

## 2023-11-01 DIAGNOSIS — I5032 Chronic diastolic (congestive) heart failure: Secondary | ICD-10-CM | POA: Diagnosis not present

## 2023-11-01 NOTE — Telephone Encounter (Signed)
 Douglas Hansen called in to confirm his appointment as scheduled. I informed Douglas Hansen that he is scheduled for tomorrow.

## 2023-11-01 NOTE — Progress Notes (Unsigned)
**Douglas Hansen De-Identified via Obfuscation**  Douglas Douglas Hansen  Patient Care Team: Gerome Brunet, DO as PCP - General (Family Medicine) Mona Vinie BROCKS, MD as PCP - Cardiology (Cardiology) Asa Aloysius LABOR, MD (Inactive) as Referring Physician (Allergy) Duke, Jon Garre, PA as Physician Assistant (Cardiology)  ASSESSMENT & PLAN:  78 y.o. male with history of asthma, COPD, OSA on CPAP, diabetes, hypertension, hyperlipidemia, GERD, arthritis referred to [facility] for macrocytic anemia.  CBC was higher than past records.  B12 was low normal.  Folate was normal.  Also has new neutropenia.  Patient denies any new medication, infection recently.  Clinically without concerning symptoms that has changed.  No peripheral bruising, petechia or lymphadenopathy.  We discussed differential diagnosis today.  If B12 is low, elevated MMA, will replace B12 first.  Will check additional blood test including thyroid  function, CMP, copper  as well.  If no clear findings, then bone marrow biopsy will be recommended. Assessment & Plan Macrocytic anemia Lab ordered as below If no clear findings, and persistent macrocytic anemia with leukopenia and neutropenia, will obtain bone marrow biopsy Neutropenia, unspecified type (HCC) Labs as below If no clear findings and persistent neutropenia with macrocytic anemia, will obtain bone marrow biopsy  Patient is okay if we needed to order bone marrow biopsy.  Tentative set up follow-up in about 6 weeks.  Orders Placed This Encounter  Procedures   CBC with Differential (Cancer Center Only)    Standing Status:   Future    Number of Occurrences:   1    Expiration Date:   11/01/2024   CMP (Cancer Center only)    Standing Status:   Future    Number of Occurrences:   1    Expiration Date:   11/01/2024   Lactate dehydrogenase    Standing Status:   Future    Number of Occurrences:   1    Expiration Date:   11/01/2024   Methylmalonic acid, serum    Standing Status:    Future    Number of Occurrences:   1    Expiration Date:   12/03/2023   Copper , serum    Standing Status:   Future    Number of Occurrences:   1    Expiration Date:   11/01/2024   Vitamin B12    Standing Status:   Future    Number of Occurrences:   1    Expiration Date:   11/01/2024   T4, free    Standing Status:   Future    Number of Occurrences:   1    Expiration Date:   12/03/2023   TSH    Standing Status:   Future    Number of Occurrences:   1    Expiration Date:   12/03/2023    All questions were answered. The patient knows to call the clinic with any problems, questions or concerns.  Douglas BROCKS [name], MD 8/26/202511:51 AM   CHIEF COMPLAINTS/PURPOSE OF CONSULTATION:  Anemia  HISTORY OF PRESENTING ILLNESS:  Douglas Douglas Hansen 78 y.o. male is here because of anemia.  Per record 23 patient has normal hemoglobin, MCV was 93.  On 08/28/2021, hemoglobin 13 MCV 93 WBC 12.4 ANC 10.1 absolute monocyte 1.1.  10/14/2023 WBC 2.1 hemoglobin 8.5 MCV 105, platelet 151.  ANC 1.0.  Normal AST ALT, bilirubin.  Creatinine was normal.  Clinically, patient denies any drenching night sweats, unexpected weight loss, decreased appetite, mass or lymph node swelling.  There is no abdominal pain or  distention.  Report of history of sweating at night for many years that is unchanged.  He denies any new medication, or recent infection.  Report he is eating pretty well and think he has a balanced diet.  He takes a multivitamin every day.  MEDICAL HISTORY:  Past Medical History:  Diagnosis Date   Agent orange exposure    Arthritis    Asthma    Asthma with acute exacerbation 06/17/2015   COPD (chronic obstructive pulmonary disease) (HCC)    Diabetes mellitus    Hyperlipemia    Hypertension    OSA on CPAP    Pneumonia    Pneumonia due to COVID-19 virus 11/22/2019   PTSD (post-traumatic stress disorder)     SURGICAL HISTORY: Past Surgical History:  Procedure Laterality Date   CARDIAC CATHETERIZATION      COLONOSCOPY N/A 10/15/2023   Procedure: COLONOSCOPY;  Surgeon: Albertus Gordy HERO, MD;  Location: [facility] ENDOSCOPY;  Service: Gastroenterology;  Laterality: N/A;   ESOPHAGOGASTRODUODENOSCOPY N/A 10/15/2023   Procedure: EGD (ESOPHAGOGASTRODUODENOSCOPY);  Surgeon: Albertus Gordy HERO, MD;  Location: [facility] ENDOSCOPY;  Service: Gastroenterology;  Laterality: N/A;   right thumb surgery     TONSILLECTOMY      SOCIAL HISTORY: Social History   Socioeconomic History   Marital status: Widowed    Spouse name: Not on file   Number of children: 5   Years of education: Not on file   Highest education level: Not on file  Occupational History   Occupation: Retired  Tobacco Use   Smoking status: Former    Current packs/day: 0.00    Average packs/day: 1 pack/day for 20.0 years (20.0 ttl pk-yrs)    Types: Cigarettes    Start date: 03/09/1965    Quit date: 03/09/1985    Years since quitting: 38.6   Smokeless tobacco: Never  Vaping Use   Vaping status: Never Used  Substance and Sexual Activity   Alcohol use: No   Drug use: No   Sexual activity: Not on file  Other Topics Concern   Not on file  Social History Narrative   ** Merged History Encounter **       Epworth Sleepiness Scale = 6 (as of 04/17/2015)   Social Drivers of Health   Financial Resource Strain: Not on file  Food Insecurity: No Food Insecurity (11/02/2023)   Hunger Vital Sign    Worried About Running Out of Food in the Last Year: Never true    Ran Out of Food in the Last Year: Never true  Transportation Needs: No Transportation Needs (11/02/2023)   PRAPARE - Administrator, Civil Service (Medical): No    Lack of Transportation (Non-Medical): No  Physical Activity: Not on file  Stress: Not on file  Social Connections: Unknown (10/14/2023)   Social Connection and Isolation Panel    Frequency of Communication with Friends and Family: Once a week    Frequency of Social Gatherings with Friends and Family: Once a week    Attends Religious  Services: 1 to 4 times per year    Active Member of [employer] or Organizations: No    Attends Banker Meetings: 1 to 4 times per year    Marital Status: Patient declined  Intimate Partner Violence: Not At Risk (11/02/2023)   Humiliation, Afraid, Rape, and Kick questionnaire    Fear of Current or Ex-Partner: No    Emotionally Abused: No    Physically Abused: No    Sexually Abused: No  FAMILY HISTORY: Family History  Problem Relation Age of Onset   Emphysema Father    Asthma Father    Heart disease Father    Heart attack Father    Stroke Mother    Heart attack Brother    Diabetes Brother    Heart attack Brother    Diabetes Brother    Colon cancer Neg Hx     ALLERGIES:  is allergic to victoza [liraglutide], codeine, lipitor [atorvastatin], and pravachol [pravastatin sodium].  MEDICATIONS:  Current Outpatient Medications  Medication Sig Dispense Refill   amLODipine  (NORVASC ) 10 MG tablet Take 10 mg by mouth daily.     ascorbic acid  (VITAMIN C ) 500 MG tablet Take 1,000 mg by mouth daily.      budesonide -formoterol  (SYMBICORT ) 80-4.5 MCG/ACT inhaler Inhale 2 puffs into the lungs 2 (two) times daily.     calcipotriene (DOVONOX) 0.005 % cream Apply 1 Application topically daily as needed (feet).     carvedilol  (COREG ) 12.5 MG tablet Take 6.25 mg by mouth 2 (two) times daily with a meal.      Cellulose Carmellose Sodium POWD Apply 1 application topically daily as needed (irritation). Apply to feet     clobetasol  ointment (TEMOVATE ) 0.05 % Apply 1 application  topically 2 (two) times daily as needed (rash).     cyclobenzaprine  (FLEXERIL ) 5 MG tablet Take 1 tablet (5 mg total) by mouth at bedtime as needed for muscle spasms. 14 tablet 0   doxazosin (CARDURA) 1 MG tablet Take 1 mg by mouth daily.     EPINEPHrine  0.3 mg/0.3 mL IJ SOAJ injection Inject 0.3 mLs (0.3 mg total) into the muscle as needed for anaphylaxis. 2 each 1   Fluocinolone  Acetonide Body 0.01 % OIL Apply 1  application  topically daily as needed (dry skin).     fluticasone  (FLONASE ) 50 MCG/ACT nasal spray Place 1 spray into both nostrils 2 (two) times daily as needed (nasal congestion). 16 g 5   furosemide  (LASIX ) 20 MG tablet Take 20 mg by mouth daily.     ketoconazole (NIZORAL) 2 % shampoo Apply 1 application  topically every Monday, Wednesday, and Friday.     methocarbamol  (ROBAXIN ) 500 MG tablet Take 1.5 tablets (750 mg total) by mouth every 6 (six) hours as needed for up to 30 doses for muscle spasms (Neck Stiffness). 30 tablet 0   montelukast  (SINGULAIR ) 10 MG tablet Take 1 tablet (10 mg total) by mouth at bedtime. 30 tablet 5   Multiple Vitamins-Minerals (MULTIVITAMIN & MINERAL PO) Take 1 tablet by mouth daily. Reported on 06/17/2015     omeprazole (PRILOSEC) 20 MG capsule Take 20 mg by mouth daily.     OXYGEN  Inhale 2 L into the lungs continuous.     Respiratory Therapy Supplies (FLUTTER) DEVI Use as directed 1 each 0   sertraline  (ZOLOFT ) 100 MG tablet Take 100 mg by mouth daily.     simethicone  (MYLICON) 80 MG chewable tablet Chew 80 mg by mouth daily.     azelastine  (ASTELIN ) 0.1 % nasal spray Place 1-2 sprays into both nostrils 2 (two) times daily as needed (nasal drainage). Use in each nostril as directed (Patient taking differently: Place 1 spray into both nostrils 2 (two) times daily as needed for allergies or rhinitis (nasal drainage).) 30 mL 5   EYLEA 2 MG/0.05ML SOLN Place into the left eye every 30 (thirty) days. INJECTION     metFORMIN  (GLUCOPHAGE ) 500 MG tablet Take 250 mg by mouth 2 (two) times daily  with a meal.     OneTouch Delica Lancets 33G MISC daily.     ONETOUCH ULTRA test strip as needed.      potassium chloride  (KLOR-CON ) 10 MEQ tablet Take 2 tablets (20 mEq total) by mouth daily for 7 days. 14 tablet 0   rosuvastatin  (CRESTOR ) 40 MG tablet Take 20 mg by mouth daily.     Current Facility-Administered Medications  Medication Dose Route Frequency Provider Last Rate Last  Admin   Benralizumab  SOSY 30 mg  30 mg Subcutaneous Q8 Weeks Luke Needle M, DO   30 mg at 12/31/20 1024   tezepelumab -ekko (TEZSPIRE ) 210 MG/1. syringe 210 mg  210 mg Subcutaneous Q28 days Luke Needle HERO, DO   210 mg at 08/05/21 1345    REVIEW OF SYSTEMS:   All relevant systems were reviewed with the patient and are negative.  PHYSICAL EXAMINATION: ECOG PERFORMANCE STATUS: 1 - Symptomatic but completely ambulatory  Vitals:   11/02/23 1012  BP: (!) 149/71  Pulse: 67  Resp: 18  Temp: (!) 97.3 F (36.3 C)  SpO2: 100%   Filed Weights   11/02/23 1012  Weight: 180 lb 4.8 oz (81.8 kg)    GENERAL: alert, no distress and comfortable SKIN: skin color normal EYES: normal, sclera clear OROPHARYNX: no exudate, no erythema, and tongue normal  NECK: supple LYMPH:  no palpable cervical, axillary lymphadenopathy LUNGS: clear to auscultation with normal breathing effort HEART: regular rate & rhythm  ABDOMEN: abdomen soft, non-tender and nondistended  RADIOGRAPHIC STUDIES: I have personally reviewed the radiological images as listed and agreed with the findings in the report. CT ABDOMEN PELVIS W CONTRAST Result Date: 10/15/2023 CLINICAL DATA:  Hypotension. Abdominal pain. Bright red blood per rectum. EXAM: CT ABDOMEN AND PELVIS WITH CONTRAST TECHNIQUE: Multidetector CT imaging of the abdomen and pelvis was performed using the standard protocol following bolus administration of intravenous contrast. RADIATION DOSE REDUCTION: This exam was performed according to the departmental dose-optimization program which includes automated exposure control, adjustment of the mA and/or kV according to patient size and/or use of iterative reconstruction technique. CONTRAST:  75mL OMNIPAQUE  IOHEXOL  350 MG/ML SOLN COMPARISON:  None Available. FINDINGS: Lower chest: Dependent atelectasis bilaterally. Tiny pericardial effusion although heart has been incompletely visualized. Hepatobiliary: No suspicious focal  abnormality within the liver parenchyma. There is no evidence for gallstones, gallbladder wall thickening, or pericholecystic fluid. No intrahepatic or extrahepatic biliary dilation. Pancreas: 13 x 12 mm low-density lesion is identified in the head of the pancreas without main duct dilatation. Spleen: No splenomegaly. No suspicious focal mass lesion. Adrenals/Urinary Tract: No adrenal nodule or mass. 2.9 cm simple cyst noted interpolar right kidney. 2.2 cm simple cyst noted upper pole left kidney. Tiny well-defined homogeneous low-density lesions in both kidneys are too small to characterize but are statistically most likely benign and probably cysts. No followup imaging is recommended. No evidence for hydroureter. The urinary bladder appears normal for the degree of distention. Stomach/Bowel: Stomach is unremarkable. No gastric wall thickening. No evidence of outlet obstruction. Duodenum is normally positioned as is the ligament of Treitz. No small bowel wall thickening. No small bowel dilatation. The terminal ileum is normal. The appendix is normal. No gross colonic mass. No colonic wall thickening. Mild left-sided diverticulosis noted without diverticulitis. There is fluid in the rectum, a CT finding that can be associated with clinical diarrhea. Vascular/Lymphatic: There is moderate atherosclerotic calcification of the abdominal aorta without aneurysm. There is no gastrohepatic or hepatoduodenal ligament lymphadenopathy. No retroperitoneal or mesenteric  lymphadenopathy. No pelvic sidewall lymphadenopathy. Reproductive: The prostate gland and seminal vesicles are unremarkable. Other: No intraperitoneal free fluid. Musculoskeletal: No worrisome lytic or sclerotic osseous abnormality. IMPRESSION: 1. Fluid in the rectum, a CT finding that can be associated with clinical diarrhea. No colonic wall thickening or pericolonic edema/inflammation to suggest colitis. 2. 13 x 12 mm low-density lesion in the head of the  pancreas without main duct dilatation. This is probably a cystic lesion and may be a pseudocyst or side branch IPMN. MRI of the abdomen with and without contrast recommended to further evaluate. MRI should be deferred to an outpatient basis after the patient recovers from acute symptoms and can best participate with positioning and reproducible breath holding. 3. Mild left-sided diverticulosis without diverticulitis. 4.  Aortic Atherosclerosis (ICD10-I70.0). Electronically Signed   By: Camellia Candle M.D.   On: 10/15/2023 05:27

## 2023-11-02 ENCOUNTER — Inpatient Hospital Stay

## 2023-11-02 VITALS — BP 149/71 | HR 67 | Temp 97.3°F | Resp 18 | Wt 180.3 lb

## 2023-11-02 DIAGNOSIS — D539 Nutritional anemia, unspecified: Secondary | ICD-10-CM | POA: Insufficient documentation

## 2023-11-02 DIAGNOSIS — D709 Neutropenia, unspecified: Secondary | ICD-10-CM

## 2023-11-02 LAB — CMP (CANCER CENTER ONLY)
ALT: 14 U/L (ref 0–44)
AST: 19 U/L (ref 15–41)
Albumin: 4.3 g/dL (ref 3.5–5.0)
Alkaline Phosphatase: 45 U/L (ref 38–126)
Anion gap: 4 — ABNORMAL LOW (ref 5–15)
BUN: 19 mg/dL (ref 8–23)
CO2: 34 mmol/L — ABNORMAL HIGH (ref 22–32)
Calcium: 10.1 mg/dL (ref 8.9–10.3)
Chloride: 102 mmol/L (ref 98–111)
Creatinine: 1.17 mg/dL (ref 0.61–1.24)
GFR, Estimated: >60 mL/min (ref >60)
Glucose, Bld: 98 mg/dL (ref 70–99)
Potassium: 5 mmol/L (ref 3.5–5.1)
Sodium: 140 mmol/L (ref 135–145)
Total Bilirubin: 0.5 mg/dL (ref 0.0–1.2)
Total Protein: 7 g/dL (ref 6.5–8.1)

## 2023-11-02 LAB — T4, FREE: Free T4: 0.89 ng/dL (ref 0.61–1.12)

## 2023-11-02 LAB — CBC WITH DIFFERENTIAL (CANCER CENTER ONLY)
Abs Immature Granulocytes: 0 K/uL (ref 0.00–0.07)
Basophils Absolute: 0 K/uL (ref 0.0–0.1)
Basophils Relative: 0 %
Eosinophils Absolute: 0.1 K/uL (ref 0.0–0.5)
Eosinophils Relative: 2 %
HCT: 29.2 % — ABNORMAL LOW (ref 39.0–52.0)
Hemoglobin: 9.4 g/dL — ABNORMAL LOW (ref 13.0–17.0)
Immature Granulocytes: 0 %
Lymphocytes Relative: 37 %
Lymphs Abs: 1.1 K/uL (ref 0.7–4.0)
MCH: 32.3 pg (ref 26.0–34.0)
MCHC: 32.2 g/dL (ref 30.0–36.0)
MCV: 100.3 fL — ABNORMAL HIGH (ref 80.0–100.0)
Monocytes Absolute: 0.4 K/uL (ref 0.1–1.0)
Monocytes Relative: 12 %
Neutro Abs: 1.4 K/uL — ABNORMAL LOW (ref 1.7–7.7)
Neutrophils Relative %: 49 %
Platelet Count: 201 K/uL (ref 150–400)
RBC: 2.91 MIL/uL — ABNORMAL LOW (ref 4.22–5.81)
RDW: 13.8 % (ref 11.5–15.5)
WBC Count: 2.9 K/uL — ABNORMAL LOW (ref 4.0–10.5)
nRBC: 0 % (ref 0.0–0.2)

## 2023-11-02 LAB — TSH: TSH: 2.58 u[IU]/mL (ref 0.350–4.500)

## 2023-11-02 LAB — LACTATE DEHYDROGENASE: LDH: 171 U/L (ref 98–192)

## 2023-11-02 LAB — VITAMIN B12: Vitamin B-12: 513 pg/mL (ref 180–914)

## 2023-11-02 NOTE — Assessment & Plan Note (Signed)
 Lab ordered as below If no clear findings, and persistent macrocytic anemia with leukopenia and neutropenia, will obtain bone marrow biopsy

## 2023-11-03 ENCOUNTER — Other Ambulatory Visit: Payer: Self-pay | Admitting: Family Medicine

## 2023-11-03 DIAGNOSIS — K869 Disease of pancreas, unspecified: Secondary | ICD-10-CM

## 2023-11-03 DIAGNOSIS — D72819 Decreased white blood cell count, unspecified: Secondary | ICD-10-CM

## 2023-11-03 DIAGNOSIS — D649 Anemia, unspecified: Secondary | ICD-10-CM

## 2023-11-04 LAB — COPPER, SERUM: Copper: 126 ug/dL (ref 69–132)

## 2023-11-05 LAB — METHYLMALONIC ACID, SERUM: Methylmalonic Acid, Quantitative: 424 nmol/L — ABNORMAL HIGH (ref 0–378)

## 2023-11-09 ENCOUNTER — Ambulatory Visit: Payer: Self-pay

## 2023-11-09 DIAGNOSIS — E538 Deficiency of other specified B group vitamins: Secondary | ICD-10-CM

## 2023-11-09 NOTE — Telephone Encounter (Signed)
-----   Message from [name] sent at 11/09/2023  8:36 AM EDT ----- Hi team, please let family know of b12 deficiency. B12 1000 mcg once daily. Recommend repeat testing in about early Dec. Please reschedule appt with lab about 2 weeks before visit in early Dec. Thank  you ----- Message ----- From: Rebecka, Lab In [place] Sent: 11/02/2023  11:27 AM EDT To: [name], MD

## 2023-11-09 NOTE — Telephone Encounter (Signed)
 Notified of message below. Verbalized understanding

## 2023-11-17 ENCOUNTER — Encounter: Payer: Self-pay | Admitting: Family Medicine

## 2023-11-27 ENCOUNTER — Ambulatory Visit
Admission: RE | Admit: 2023-11-27 | Discharge: 2023-11-27 | Disposition: A | Discharge status: Home / Self Care | Source: Ambulatory Visit | Attending: Family Medicine | Admitting: Family Medicine

## 2023-11-27 DIAGNOSIS — K869 Disease of pancreas, unspecified: Secondary | ICD-10-CM

## 2023-11-27 DIAGNOSIS — D72819 Decreased white blood cell count, unspecified: Secondary | ICD-10-CM

## 2023-11-27 DIAGNOSIS — D649 Anemia, unspecified: Secondary | ICD-10-CM

## 2023-11-27 DIAGNOSIS — K5730 Diverticulosis of large intestine without perforation or abscess without bleeding: Secondary | ICD-10-CM | POA: Diagnosis not present

## 2023-11-27 MED ORDER — GADOPICLENOL 0.5 MMOL/ML IV SOLN
10.0000 mL | Freq: Once | INTRAVENOUS | Status: AC | PRN
  Administered 2023-11-27: 9 mL via INTRAVENOUS

## 2023-11-30 ENCOUNTER — Telehealth: Payer: Self-pay

## 2023-11-30 NOTE — Telephone Encounter (Signed)
 Called the patient to reschedule the follow up appointment per provider being on PAL. The patient is aware of the changes made and is active on MyChart.

## 2023-12-06 DIAGNOSIS — Z23 Encounter for immunization: Secondary | ICD-10-CM | POA: Diagnosis not present

## 2023-12-06 DIAGNOSIS — K862 Cyst of pancreas: Secondary | ICD-10-CM | POA: Diagnosis not present

## 2023-12-14 ENCOUNTER — Ambulatory Visit

## 2024-01-24 ENCOUNTER — Inpatient Hospital Stay

## 2024-01-24 DIAGNOSIS — D539 Nutritional anemia, unspecified: Secondary | ICD-10-CM | POA: Insufficient documentation

## 2024-01-24 DIAGNOSIS — E538 Deficiency of other specified B group vitamins: Secondary | ICD-10-CM

## 2024-01-24 LAB — CMP (CANCER CENTER ONLY)
ALT: 11 U/L (ref 0–44)
AST: 16 U/L (ref 15–41)
Albumin: 4.2 g/dL (ref 3.5–5.0)
Alkaline Phosphatase: 41 U/L (ref 38–126)
Anion gap: 4 — ABNORMAL LOW (ref 5–15)
BUN: 18 mg/dL (ref 8–23)
CO2: 36 mmol/L — ABNORMAL HIGH (ref 22–32)
Calcium: 10.1 mg/dL (ref 8.9–10.3)
Chloride: 99 mmol/L (ref 98–111)
Creatinine: 1.03 mg/dL (ref 0.61–1.24)
GFR, Estimated: >60 mL/min (ref >60)
Glucose, Bld: 96 mg/dL (ref 70–99)
Potassium: 4.8 mmol/L (ref 3.5–5.1)
Sodium: 139 mmol/L (ref 135–145)
Total Bilirubin: 0.5 mg/dL (ref 0.0–1.2)
Total Protein: 6.6 g/dL (ref 6.5–8.1)

## 2024-01-24 LAB — CBC WITH DIFFERENTIAL (CANCER CENTER ONLY)
Abs Immature Granulocytes: 0 K/uL (ref 0.00–0.07)
Basophils Absolute: 0 K/uL (ref 0.0–0.1)
Basophils Relative: 0 %
Eosinophils Absolute: 0.1 K/uL (ref 0.0–0.5)
Eosinophils Relative: 3 %
HCT: 27.1 % — ABNORMAL LOW (ref 39.0–52.0)
Hemoglobin: 8.9 g/dL — ABNORMAL LOW (ref 13.0–17.0)
Immature Granulocytes: 0 %
Lymphocytes Relative: 30 %
Lymphs Abs: 0.7 K/uL (ref 0.7–4.0)
MCH: 32.8 pg (ref 26.0–34.0)
MCHC: 32.8 g/dL (ref 30.0–36.0)
MCV: 100 fL (ref 80.0–100.0)
Monocytes Absolute: 0.3 K/uL (ref 0.1–1.0)
Monocytes Relative: 11 %
Neutro Abs: 1.4 K/uL — ABNORMAL LOW (ref 1.7–7.7)
Neutrophils Relative %: 56 %
Platelet Count: 187 K/uL (ref 150–400)
RBC: 2.71 MIL/uL — ABNORMAL LOW (ref 4.22–5.81)
RDW: 13.7 % (ref 11.5–15.5)
WBC Count: 2.5 K/uL — ABNORMAL LOW (ref 4.0–10.5)
nRBC: 0 % (ref 0.0–0.2)

## 2024-01-24 LAB — VITAMIN B12: Vitamin B-12: 1138 pg/mL — ABNORMAL HIGH (ref 180–914)

## 2024-01-24 LAB — LACTATE DEHYDROGENASE: LDH: 156 U/L (ref 105–235)

## 2024-01-28 LAB — METHYLMALONIC ACID, SERUM: Methylmalonic Acid, Quantitative: 446 nmol/L — ABNORMAL HIGH (ref 0–378)

## 2024-02-01 ENCOUNTER — Ambulatory Visit: Payer: Self-pay

## 2024-02-01 ENCOUNTER — Inpatient Hospital Stay (HOSPITAL_BASED_OUTPATIENT_CLINIC_OR_DEPARTMENT_OTHER)

## 2024-02-01 DIAGNOSIS — D708 Other neutropenia: Secondary | ICD-10-CM

## 2024-02-01 DIAGNOSIS — D709 Neutropenia, unspecified: Secondary | ICD-10-CM | POA: Insufficient documentation

## 2024-02-01 DIAGNOSIS — D539 Nutritional anemia, unspecified: Secondary | ICD-10-CM

## 2024-02-01 NOTE — Telephone Encounter (Signed)
 Called daughter Joen and she will go to his house for a phone visit at 2:30

## 2024-02-01 NOTE — Assessment & Plan Note (Addendum)
 Patient prefers short-term follow-up Repeat labs 1/26 See me on 1/27.  If no improvement, or worsening will obtain bone marrow biopsy

## 2024-02-01 NOTE — Telephone Encounter (Signed)
-----   Message from [name] sent at 02/01/2024 10:08 AM EST ----- I called both phone but no answer.  Left message to call back regarding his results.  Douglas Hansen we can change his appointment from next week to a phone visit today if he calls back. I couldn't reach him. Thanks.  ----- Message ----- From: Rebecka, Lab In [place] Sent: 01/24/2024  12:31 PM EST To: [name], MD

## 2024-02-01 NOTE — Assessment & Plan Note (Addendum)
 Appears stable

## 2024-02-01 NOTE — Progress Notes (Signed)
 [facility] OFFICE PROGRESS NOTE  Patient Care Team: Gerome Brunet, DO as PCP - General (Family Medicine) Mona Vinie BROCKS, MD as PCP - Cardiology (Cardiology) Asa Aloysius LABOR, MD (Inactive) as Referring Physician (Allergy) Duke, Jon Garre, PA as Physician Assistant (Cardiology)  78 y.o. male with history of asthma, COPD, OSA on CPAP, diabetes, hypertension, hyperlipidemia, GERD, arthritis referred to [facility] for macrocytic anemia.   Telephone visit requested per patient. Physician location: [facility] Patient location: Home with daughter Joen Total time for the encounter: 33 minutes  We discussed macrocytic anemia, borderline neutropenia.  This has not changed much.  After B12 replacement, B12 is now morning enough, the number has not improved.  Patient denies any GI bleed, or other bleeding.  He has some urinary frequency, abdominal discomfort at times.  Reports some night sweats.  Report of leg is not as strong.  We discussed bone marrow biopsy given that he is now having new night sweats.  He would like to defer at this time.  After discussion, he would like to first address the other systemic symptoms.  Recommend him to follow-up with PCP to address his GI symptoms, genitourinary symptoms, and leg symptoms.  He will have a short-term follow-up with us  to repeat blood test. Assessment & Plan Macrocytic anemia Patient prefers short-term follow-up Repeat labs 1/26 See me on 1/27.  If no improvement, or worsening will obtain bone marrow biopsy Other neutropenia Appears stable  Orders Placed This Encounter  Procedures   CBC with Differential (Cancer Center Only)    Standing Status:   Future    Expiration Date:   01/31/2025   CMP (Cancer Center only)    Standing Status:   Future    Expiration Date:   01/31/2025   Lactate dehydrogenase    Standing Status:   Future    Expiration Date:   01/31/2025   Vitamin  B12    Standing Status:   Future    Expiration Date:   01/31/2025   Folate    Standing Status:   Future    Expiration Date:   01/31/2025   Ferritin    Standing Status:   Future    Expiration Date:   01/31/2025     Pauletta BROCKS [name], MD  INTERVAL HISTORY: Patient has a telephone visit today.  Overall he denies much change.  Review of system reports some night sweats.  Less no loss of appetite or weight loss.  No mass or lumps.  No active chest pain.  Some abdominal discomfort at times.  Reported urinary frequency.  There is no hematuria, melena or hematochezia or bleeding.  Reports some leg weakness.  He has not seen his PCP for new symptoms.  Hematology oncology history: He was first seen in end of August 2025.  At the time denies any drenching night sweats, unexpected weight loss, decreased appetite, mass or lymph node swelling.  There is no abdominal pain or distention.  Report of history of sweating at night for many years that is unchanged.  He denies any new medication, or recent infection.  Report he is eating pretty well and think he has a balanced diet.  He takes a multivitamin every day.  Per record in 2023 patient had normal hemoglobin, MCV was 93.  On 08/28/2021, hemoglobin 13 MCV 93 WBC 12.4 ANC 10.1 absolute monocyte 1.1.   10/14/2023 WBC 2.1 hemoglobin 8.5 MCV 105, platelet 151.  ANC 1.0.  Normal AST ALT, bilirubin.  Creatinine was normal.  B12 was 300.  Ferritin 144 folate 17.5.  10/15/23 EGD showed erythematous mucosa in the prepyloric region of the stomach.  Colonoscopy showed polyps, internal hemorrhoids, diverticulosis.  11/02/23 WBC 2.9 hemoglobin 9.4 MCV 100 and platelet 201 ANC 1.4.  B12 was 513 and MMA was 424.  Copper  126.  11/27/23 MRI abdomen showed 1.6 x 1.3 cm pancreatic head lesion compatible with sidebranch IPMN.  No suspicious feature.    Relevant data reviewed during this visit included recent labs and imaging.

## 2024-02-07 ENCOUNTER — Ambulatory Visit

## 2024-02-08 ENCOUNTER — Ambulatory Visit

## 2024-02-28 NOTE — Progress Notes (Unsigned)
 " Cardiology Office Note   Date:  02/29/2024  ID:  Douglas Hansen, DOB June 20, 1945, MRN [id number] PCP: Gerome Brunet, DO  [facility] Providers Cardiologist:  [name], MD Cardiology APP:  Madie Jon Garre, [place]     PMH Hypertension OSA on CPAP Nonobstructive CAD on CCTA 12/2019 COPD Diabetes Hyperlipidemia GERD Claudication PTSD Anemia  History of 2 prior cardiac catheterizations, last in 2004 with no significant coronary disease.  Stress test February 2014 negative for ischemia.  Coronary CTA 12/2019 with CAC score of 209 (44th percentile), moderate stenosis in proximal RCA and proximal portion of first diagonal, CT FFR showed moderate nonobstructive CAD. Moderately dilated pulmonary artery suspicious for pulmonary hypertension.  PFO was noted.  Echo at the time showed normal LVEF, normal RV function and no significant valve disease.  Last cardiology clinic visit was 07/17/2021 with Mercy Madie, PA.  He had recently been seen in urgent care for neck pain and was found to be hypoxic in the mid 80s, febrile 101, tachycardia in the 100s and hypertensive.  Was sent to ER for further evaluation and admitted for acute hypoxic respiratory failure/COPD exacerbation.  VQ scan negative for PE.  He was discharged 07/14/2021.  He was requesting a note to travel with oxygen .  Recommendation was to follow-up with pulmonology either at the [facility], or with referral to [facility] pulmonology.  Was advised to reduce aspirin  to 81 mg daily, continue carvedilol , amlodipine , Lasix , and Cardura.  LDL cholesterol was 46.  History of Present Illness Discussed the use of AI scribe software for clinical note transcription with the patient, who gave verbal consent to proceed.  History of Present Illness Douglas Hansen is a very pleasant 78 year old male who presents for a cardiovascular follow-up. He is accompanied by his grandson, with whom he lives. His daughter also lives with him. He was advised to return  to cardiology for EKG per request of hematology.  He was diagnosed with anemia and is being followed by [facility]. He is on portable oxygen  and reports chronic shortness of breath. He uses a cane for assistance and generally limits activities to around the house and appointments with healthcare providers.  He notes weakness in his left leg but feels significantly weaker than his right.  He feels like he cannot bear weight on the left leg.  He denies prior stroke, or symptoms concerning for stroke. He has degenerative arthritis of the back. He has brief episodes of heart racing about once every five to six months. He eats frequent fast food and drinks a lot of Diet Coke.  He denies orthopnea, PND, chest pain, edema, presyncope, syncope.   Left leg seems weaker than right   ROS: See HPI  Studies Reviewed       No results found for: LIPOA  Risk Assessment/Calculations           Physical Exam VS:  BP 120/76   Pulse 76   Ht 5' 6 (1.676 m)   Wt 181 lb 1.6 oz (82.1 kg)   SpO2 95%   BMI 29.23 kg/m    Wt Readings from Last 3 Encounters:  02/29/24 181 lb 1.6 oz (82.1 kg)  11/02/23 180 lb 4.8 oz (81.8 kg)  10/14/23 178 lb (80.7 kg)    GEN: Well nourished, well developed in no acute distress NECK: No JVD; No carotid bruits CARDIAC: RRR, no murmurs, rubs, gallops RESPIRATORY:  Clear to auscultation without rales, wheezing or rhonchi  ABDOMEN: Soft, non-tender, non-distended EXTREMITIES:  No edema; No deformity   Assessment & Plan Coronary artery disease   Nonobstructive coronary artery disease by CT in 2021. He denies chest pain, dyspnea, or other symptoms concerning for angina.  No indication for further ischemic evaluation at this time.  He reports regular monitoring of lipid levels by PCP, however I do not have any results to review.  Activity limited by chronic pain secondary to arthritis and respiratory failure.  No acute changes today. - Continue aspirin , amlodipine ,  rosuvastatin , carvedilol , doxazosin  Hypertension BP is well-controlled.  He does not monitor at home.  No change in antihypertensive therapy today.  Creatinine stable on labs completed 01/24/2024. - Continue amlodipine , carvedilol , doxazosin  COPD Chronic DOE which he feels is stable. On home O2. No acute concerns today.  - Management per pulmonology  Hyperlipidemia LDL goal < 55 Lipid panel completed 10/11/2023 with total cholesterol 107, HDL 40, LDL 40, and triglycerides 841.  We discussed dietary modifications to potentially improve triglycerides including reducing soda and processed food intake. - Continue rosuvastatin  - Heart healthy diet avoiding processed foods, saturated fat, sugar, and other simple carbohydrates encouraged  Peripheral vascular disease   Reports left leg weakness. He asks about decreased blood flow. Normal LE arterial dopplers 07/2016. We discussed potentially repeating ABI.  - He requests notes be sent to [facility] and primary care provider for further review        Dispo: 1 year with Dr. Mona  Signed, Rosaline Bane, NP-C "

## 2024-02-29 ENCOUNTER — Ambulatory Visit (INDEPENDENT_AMBULATORY_CARE_PROVIDER_SITE_OTHER): Admitting: Nurse Practitioner

## 2024-02-29 ENCOUNTER — Encounter (HOSPITAL_BASED_OUTPATIENT_CLINIC_OR_DEPARTMENT_OTHER): Payer: Self-pay | Admitting: Nurse Practitioner

## 2024-02-29 VITALS — BP 120/76 | HR 76 | Ht 66.0 in | Wt 181.1 lb

## 2024-02-29 DIAGNOSIS — J449 Chronic obstructive pulmonary disease, unspecified: Secondary | ICD-10-CM | POA: Diagnosis not present

## 2024-02-29 DIAGNOSIS — I2510 Atherosclerotic heart disease of native coronary artery without angina pectoris: Secondary | ICD-10-CM

## 2024-02-29 DIAGNOSIS — E785 Hyperlipidemia, unspecified: Secondary | ICD-10-CM

## 2024-02-29 DIAGNOSIS — I739 Peripheral vascular disease, unspecified: Secondary | ICD-10-CM | POA: Diagnosis not present

## 2024-02-29 DIAGNOSIS — I10 Essential (primary) hypertension: Secondary | ICD-10-CM | POA: Diagnosis not present

## 2024-02-29 NOTE — Patient Instructions (Signed)
 Medication Instructions:   Your physician recommends that you continue on your current medications as directed. Please refer to the Current Medication list given to you today.   *If you need a refill on your cardiac medications before your next appointment, please call your pharmacy*  Lab Work:  None ordered.  If you have labs (blood work) drawn today and your tests are completely normal, you will receive your results only by: MyChart Message (if you have MyChart) OR A paper copy in the mail If you have any lab test that is abnormal or we need to change your treatment, we will call you to review the results.  Testing/Procedures: Patient is going to check to get this test at the [place].  Your physician has requested that you have an ankle brachial index (ABI). During this test an ultrasound and blood pressure cuff are used to evaluate the arteries that supply the arms and legs with blood. Allow thirty minutes for this exam. There are no restrictions or special instructions.   Follow-Up: At [facility], you and your health needs are our priority.  As part of our continuing mission to provide you with exceptional heart care, our providers are all part of one team.  This team includes your primary Cardiologist (physician) and Advanced Practice Providers or APPs (Physician Assistants and Nurse Practitioners) who all work together to provide you with the care you need, when you need it.  Your next appointment:   1 year(s)  Provider:   [name], MD    We recommend signing up for the patient portal called MyChart.  Sign up information is provided on this After Visit Summary.  MyChart is used to connect with patients for Virtual Visits (Telemedicine).  Patients are able to view lab/test results, encounter notes, upcoming appointments, etc.  Non-urgent messages can be sent to your provider as well.   To learn more about what you can do with MyChart, go to [email or web address].    Other Instructions  Your physician wants you to follow-up in: 1 year.  You will receive a reminder letter in the mail two months in advance. If you don't receive a letter, please call our office to schedule the follow-up appointment.

## 2024-04-03 ENCOUNTER — Inpatient Hospital Stay

## 2024-04-03 NOTE — Progress Notes (Unsigned)
  [facility] OFFICE PROGRESS NOTE  Patient Care Team: Gerome Brunet, DO as PCP - General (Family Medicine) Mona Vinie BROCKS, MD as PCP - Cardiology (Cardiology) Asa Aloysius LABOR, MD (Inactive) as Referring Physician (Allergy) Duke, Jon Garre, PA as Physician Assistant (Cardiology)  79 y.o. male with history of asthma, COPD, OSA on CPAP, diabetes, hypertension, hyperlipidemia, GERD, arthritis referred to [facility] for macrocytic anemia.   Labs showed macrocytic anemia and borderline neutropenia.  Assessment & Plan   No orders of the defined types were placed in this encounter.    Pauletta BROCKS [name], MD  INTERVAL HISTORY: Patient returns for follow-up.  Oncology History   No problem history exists.     PHYSICAL EXAMINATION: ECOG PERFORMANCE STATUS: {CHL ONC ECOG PS:[id number]}  There were no vitals filed for this visit. There were no vitals filed for this visit.  GENERAL: alert, no distress and comfortable SKIN: skin color normal and no jaundice or bruising or petechiae on exposed skin EYES: normal, sclera clear OROPHARYNX: no exudate  NECK: No palpable mass LYMPH:  no palpable cervical, axillary lymphadenopathy  LUNGS: clear to auscultation and no wheeze or rales with normal breathing effort HEART: regular rate & rhythm  ABDOMEN: abdomen soft, non-tender and nondistended. Musculoskeletal: no edema NEURO: no focal motor/sensory deficits  Relevant data reviewed during this visit included labs.  New labs ordered.

## 2024-04-04 ENCOUNTER — Inpatient Hospital Stay
